# Patient Record
Sex: Male | Born: 1935 | Race: White | Hispanic: No | Marital: Married | Smoking: Former smoker
Health system: Southern US, Community
[De-identification: ages and names within clinical notes are randomized; demographics above are authoritative.]

## PROBLEM LIST (undated history)

## (undated) DIAGNOSIS — E785 Hyperlipidemia, unspecified: Secondary | ICD-10-CM

## (undated) DIAGNOSIS — I872 Venous insufficiency (chronic) (peripheral): Secondary | ICD-10-CM

## (undated) DIAGNOSIS — I4892 Unspecified atrial flutter: Secondary | ICD-10-CM

## (undated) DIAGNOSIS — I7772 Dissection of iliac artery: Secondary | ICD-10-CM

## (undated) DIAGNOSIS — I8390 Asymptomatic varicose veins of unspecified lower extremity: Secondary | ICD-10-CM

## (undated) DIAGNOSIS — C61 Malignant neoplasm of prostate: Secondary | ICD-10-CM

## (undated) DIAGNOSIS — Z992 Dependence on renal dialysis: Secondary | ICD-10-CM

## (undated) DIAGNOSIS — I219 Acute myocardial infarction, unspecified: Secondary | ICD-10-CM

## (undated) DIAGNOSIS — I2510 Atherosclerotic heart disease of native coronary artery without angina pectoris: Secondary | ICD-10-CM

## (undated) DIAGNOSIS — H9192 Unspecified hearing loss, left ear: Secondary | ICD-10-CM

## (undated) DIAGNOSIS — I739 Peripheral vascular disease, unspecified: Secondary | ICD-10-CM

## (undated) DIAGNOSIS — F32A Depression, unspecified: Secondary | ICD-10-CM

## (undated) DIAGNOSIS — M069 Rheumatoid arthritis, unspecified: Secondary | ICD-10-CM

## (undated) DIAGNOSIS — K219 Gastro-esophageal reflux disease without esophagitis: Secondary | ICD-10-CM

## (undated) DIAGNOSIS — I10 Essential (primary) hypertension: Secondary | ICD-10-CM

## (undated) DIAGNOSIS — R1310 Dysphagia, unspecified: Secondary | ICD-10-CM

## (undated) DIAGNOSIS — I714 Abdominal aortic aneurysm, without rupture, unspecified: Secondary | ICD-10-CM

## (undated) DIAGNOSIS — I499 Cardiac arrhythmia, unspecified: Secondary | ICD-10-CM

## (undated) DIAGNOSIS — N289 Disorder of kidney and ureter, unspecified: Secondary | ICD-10-CM

## (undated) DIAGNOSIS — K5900 Constipation, unspecified: Secondary | ICD-10-CM

## (undated) DIAGNOSIS — E039 Hypothyroidism, unspecified: Secondary | ICD-10-CM

## (undated) DIAGNOSIS — I639 Cerebral infarction, unspecified: Secondary | ICD-10-CM

## (undated) DIAGNOSIS — I82409 Acute embolism and thrombosis of unspecified deep veins of unspecified lower extremity: Secondary | ICD-10-CM

## (undated) DIAGNOSIS — S129XXA Fracture of neck, unspecified, initial encounter: Secondary | ICD-10-CM

## (undated) DIAGNOSIS — N186 End stage renal disease: Secondary | ICD-10-CM

## (undated) DIAGNOSIS — F329 Major depressive disorder, single episode, unspecified: Secondary | ICD-10-CM

## (undated) DIAGNOSIS — I5022 Chronic systolic (congestive) heart failure: Secondary | ICD-10-CM

## (undated) HISTORY — DX: Asymptomatic varicose veins of unspecified lower extremity: I83.90

## (undated) HISTORY — PX: CATARACT EXTRACTION: SUR2

## (undated) HISTORY — PX: EYE SURGERY: SHX253

## (undated) HISTORY — PX: CARDIAC SURGERY: SHX584

---

## 2005-10-02 ENCOUNTER — Ambulatory Visit: Payer: Self-pay | Admitting: Cardiology

## 2005-12-21 ENCOUNTER — Ambulatory Visit: Payer: Self-pay | Admitting: Cardiology

## 2006-11-05 DIAGNOSIS — I7772 Dissection of iliac artery: Secondary | ICD-10-CM

## 2006-11-05 HISTORY — DX: Dissection of iliac artery: I77.72

## 2007-09-25 ENCOUNTER — Encounter (INDEPENDENT_AMBULATORY_CARE_PROVIDER_SITE_OTHER): Payer: Self-pay | Admitting: Ophthalmology

## 2007-09-25 ENCOUNTER — Ambulatory Visit (HOSPITAL_COMMUNITY): Admission: RE | Admit: 2007-09-25 | Discharge: 2007-09-26 | Payer: Self-pay | Admitting: Ophthalmology

## 2007-12-01 ENCOUNTER — Emergency Department (HOSPITAL_COMMUNITY): Admission: EM | Admit: 2007-12-01 | Discharge: 2007-12-01 | Payer: Self-pay | Admitting: Emergency Medicine

## 2010-09-14 ENCOUNTER — Ambulatory Visit
Admission: RE | Admit: 2010-09-14 | Discharge: 2010-10-23 | Payer: Self-pay | Source: Home / Self Care | Attending: Radiation Oncology | Admitting: Radiation Oncology

## 2010-09-15 ENCOUNTER — Ambulatory Visit (HOSPITAL_COMMUNITY)
Admission: RE | Admit: 2010-09-15 | Discharge: 2010-09-15 | Payer: Self-pay | Source: Home / Self Care | Admitting: Radiation Oncology

## 2011-01-16 LAB — CREATININE, SERUM: Creatinine, Ser: 2.01 mg/dL — ABNORMAL HIGH (ref 0.4–1.5)

## 2011-02-04 DIAGNOSIS — I82409 Acute embolism and thrombosis of unspecified deep veins of unspecified lower extremity: Secondary | ICD-10-CM

## 2011-02-04 HISTORY — DX: Acute embolism and thrombosis of unspecified deep veins of unspecified lower extremity: I82.409

## 2011-02-28 ENCOUNTER — Inpatient Hospital Stay (HOSPITAL_COMMUNITY)
Admission: EM | Admit: 2011-02-28 | Discharge: 2011-03-03 | DRG: 312 | Disposition: A | Discharge status: Home Health | Payer: Medicare Other | Attending: Otolaryngology | Admitting: Otolaryngology

## 2011-02-28 ENCOUNTER — Emergency Department (HOSPITAL_COMMUNITY): Payer: Medicare Other

## 2011-02-28 DIAGNOSIS — E785 Hyperlipidemia, unspecified: Secondary | ICD-10-CM | POA: Diagnosis present

## 2011-02-28 DIAGNOSIS — M069 Rheumatoid arthritis, unspecified: Secondary | ICD-10-CM | POA: Diagnosis present

## 2011-02-28 DIAGNOSIS — I498 Other specified cardiac arrhythmias: Secondary | ICD-10-CM | POA: Diagnosis present

## 2011-02-28 DIAGNOSIS — N400 Enlarged prostate without lower urinary tract symptoms: Secondary | ICD-10-CM | POA: Diagnosis present

## 2011-02-28 DIAGNOSIS — I951 Orthostatic hypotension: Principal | ICD-10-CM | POA: Diagnosis present

## 2011-02-28 DIAGNOSIS — F329 Major depressive disorder, single episode, unspecified: Secondary | ICD-10-CM | POA: Diagnosis present

## 2011-02-28 DIAGNOSIS — E875 Hyperkalemia: Secondary | ICD-10-CM | POA: Diagnosis present

## 2011-02-28 DIAGNOSIS — N189 Chronic kidney disease, unspecified: Secondary | ICD-10-CM | POA: Diagnosis present

## 2011-02-28 DIAGNOSIS — I825Y9 Chronic embolism and thrombosis of unspecified deep veins of unspecified proximal lower extremity: Secondary | ICD-10-CM | POA: Diagnosis present

## 2011-02-28 DIAGNOSIS — N289 Disorder of kidney and ureter, unspecified: Secondary | ICD-10-CM | POA: Diagnosis present

## 2011-02-28 DIAGNOSIS — I129 Hypertensive chronic kidney disease with stage 1 through stage 4 chronic kidney disease, or unspecified chronic kidney disease: Secondary | ICD-10-CM | POA: Diagnosis present

## 2011-02-28 DIAGNOSIS — F3289 Other specified depressive episodes: Secondary | ICD-10-CM | POA: Diagnosis present

## 2011-02-28 HISTORY — DX: Essential (primary) hypertension: I10

## 2011-02-28 LAB — DIFFERENTIAL
Basophils Absolute: 0 10*3/uL (ref 0.0–0.1)
Lymphocytes Relative: 21 % (ref 12–46)
Lymphs Abs: 1.5 10*3/uL (ref 0.7–4.0)
Monocytes Absolute: 0.6 10*3/uL (ref 0.1–1.0)
Neutro Abs: 5.2 10*3/uL (ref 1.7–7.7)

## 2011-02-28 LAB — BASIC METABOLIC PANEL
CO2: 22 mEq/L (ref 19–32)
Glucose, Bld: 92 mg/dL (ref 70–99)
Potassium: 5.9 mEq/L — ABNORMAL HIGH (ref 3.5–5.1)
Sodium: 138 mEq/L (ref 135–145)

## 2011-02-28 LAB — CBC
HCT: 40 % (ref 39.0–52.0)
Hemoglobin: 12.4 g/dL — ABNORMAL LOW (ref 13.0–17.0)
MCV: 87.5 fL (ref 78.0–100.0)
WBC: 7.5 10*3/uL (ref 4.0–10.5)

## 2011-02-28 LAB — POCT CARDIAC MARKERS: CKMB, poc: 1 ng/mL (ref 1.0–8.0)

## 2011-03-01 ENCOUNTER — Inpatient Hospital Stay (HOSPITAL_COMMUNITY): Payer: Medicare Other

## 2011-03-01 LAB — CARDIAC PANEL(CRET KIN+CKTOT+MB+TROPI)
CK, MB: 0.9 ng/mL (ref 0.3–4.0)
CK, MB: 1.1 ng/mL (ref 0.3–4.0)
Total CK: 23 U/L (ref 7–232)
Total CK: 25 U/L (ref 7–232)
Total CK: 29 U/L (ref 7–232)

## 2011-03-01 LAB — BASIC METABOLIC PANEL
CO2: 23 mEq/L (ref 19–32)
Chloride: 108 mEq/L (ref 96–112)
GFR calc non Af Amer: 29 mL/min — ABNORMAL LOW (ref >60)
Glucose, Bld: 83 mg/dL (ref 70–99)
Potassium: 4.6 mEq/L (ref 3.5–5.1)
Sodium: 139 mEq/L (ref 135–145)

## 2011-03-01 LAB — DIFFERENTIAL
Basophils Absolute: 0 10*3/uL (ref 0.0–0.1)
Lymphocytes Relative: 40 % (ref 12–46)
Neutro Abs: 2.2 10*3/uL (ref 1.7–7.7)
Neutrophils Relative %: 43 % (ref 43–77)

## 2011-03-01 LAB — CBC
HCT: 36.4 % — ABNORMAL LOW (ref 39.0–52.0)
Hemoglobin: 11.2 g/dL — ABNORMAL LOW (ref 13.0–17.0)
RBC: 4.16 MIL/uL — ABNORMAL LOW (ref 4.22–5.81)
RDW: 14.5 % (ref 11.5–15.5)
WBC: 5.1 10*3/uL (ref 4.0–10.5)

## 2011-03-01 NOTE — H&P (Signed)
[name], [name]               ACCOUNT NO.:  [id number]  MEDICAL RECORD NO.:  [id number]           PATIENT TYPE:  I  LOCATION:  A301                          FACILITY:  [facility]  PHYSICIAN:  Tarry Kos, MD       DATE OF BIRTH:  [birth year]  DATE OF ADMISSION:  02/28/2011 DATE OF DISCHARGE:  LH                             HISTORY & PHYSICAL   CHIEF COMPLAINT:  Syncope.  HISTORY OF PRESENT ILLNESS:  Mr. [name] is a 77 year old male with a history of chronic kidney disease, rheumatoid arthritis who presented to the emergency department after suffering from a syncopal episode at the store today at around 12:30 p.m.  He says he ate breakfast.  He did not eat lunch.  He was standing at the store to check out.  He suddenly got very dizzy and was experiencing some neck pain, at which point, he passed out for just several seconds and came back to his normal mental status.  It happened very briefly.  He does not have any focal neurological deficits.  He denies any slurred speech, denies any change in his movement of his arms or legs.  He has not had any confusion.  His wife and several family members are with him right now and they said that they did not notice anything after the syncopal episode.  He did not suffer any significant head trauma.  He denies any heart problems. He really does not know much of his past medical history and he has never been admitted here before.  He does have chronic kidney disease. He states he has a BPH.  He has never had a heart attack or stroke before.  He has not been running any fevers.  No nausea, no vomiting, no diarrhea and no dysuria.  REVIEW OF SYSTEMS:  Otherwise negative.  PAST MEDICAL HISTORY:  He really does not know and there is no past medical records here for rheumatoid arthritis.  He says he has BPH.  MEDICATIONS:  He has no idea what medications he is on and they are not here.  I have asked his family members to bring those in.  SOCIAL  HISTORY:  Nonsmoker.  No alcohol.  No IV drug abuse.  ALLERGIES:  None.  PHYSICAL EXAMINATION:  VITALS:  Temperature is 97.8, blood pressure has been between 100 and 116 with diastolics in the 50s, heart rate initially was low in the 50s, this is between 55 and 75, respiratory rate 17, 97% O2 sats on room air. GENERAL:  He is alert and oriented x4.  No apparent distress, cooperative fairly. CARDIAC:  Regular rate and rhythm without murmurs, rubs or gallops. CHEST:  Clear to auscultation bilaterally with or rales.  No wheeze, rhonchi, or rubs. ABDOMEN:  Soft, nontender, and nondistended.  Positive bowel sounds.  No hepatosplenomegaly. EXTREMITIES:  No clubbing, cyanosis, or edema. PSYCH:  Normal mood and affect. NEURO:  Cranial nerves II through XII grossly intact.  No focal neurologic deficits.  LABORATORY DATA:  His sodium is normal.  His potassium is 5.9.  His creatinine is 2.2.  His last creatinine was in  November 2008 and that was 1.6.  Hemoglobin is normal.  White count is normal.  Cardiac enzymes are negative.  An 12-lead EKG sinus bradycardia without any acute changes.  CT of his head shows a small lacunar-type infarct in the left thalamus.  No skull fracture.  CT of the spine normal.  ASSESSMENT AND PLAN: 1. This is a 77 year old male with syncope of unclear etiology.  His     CT shows a questionable lacunar infarct which is small.  I am not     sure this is the cause of his symptoms and we are going to rule out     other issues and check MRI, MRA of his head and neck.  Also,     continued on telemetry and checked a 2-D echo of his heart and     obtained neurological checks q.4 h.  He has no focal neurologic     deficits right now, but it sounds more or like an arrhythmia than a     stroke per his history.  We will check orthostatics also to make     sure that he is not orthostatic. 2. Sinus bradycardia.  Again, rule out arrhythmias and I cannot review     his home  medications as he has no idea what medicines he is on.  I     have asked the family to bring these in, but this could also be due     to medication effect depending on what he is on. 3. Chronic kidney disease with a creatinine right now of 2.3.  His     last creatinine here was in 2008 and it was 1.6-1.7.  I placed him     on some gentle IV fluid hydration overnight and rechecked his BMP     in the morning. 4. Further recommendations depending on overall hospital course.                                           ______________________________ Tarry Kos, MD     RD/MEDQ  D:  02/28/2011  T:  03/01/2011  Job:  [id number]  Electronically Signed by Tarry Kos MD on 03/01/2011 06:27:48 AM

## 2011-03-02 ENCOUNTER — Encounter (HOSPITAL_COMMUNITY): Payer: Self-pay

## 2011-03-02 ENCOUNTER — Inpatient Hospital Stay (HOSPITAL_COMMUNITY): Payer: Medicare Other

## 2011-03-02 DIAGNOSIS — I517 Cardiomegaly: Secondary | ICD-10-CM

## 2011-03-02 LAB — LIPID PANEL
Cholesterol: 246 mg/dL — ABNORMAL HIGH (ref 0–200)
HDL: 30 mg/dL — ABNORMAL LOW (ref >39)
LDL Cholesterol: 185 mg/dL — ABNORMAL HIGH (ref 0–99)
Total CHOL/HDL Ratio: 8.2 RATIO
Triglycerides: 155 mg/dL — ABNORMAL HIGH (ref <150)
VLDL: 31 mg/dL (ref 0–40)

## 2011-03-02 LAB — CBC
HCT: 39 % (ref 39.0–52.0)
MCHC: 30.8 g/dL (ref 30.0–36.0)
Platelets: 350 10*3/uL (ref 150–400)
RDW: 14.5 % (ref 11.5–15.5)
WBC: 5.6 10*3/uL (ref 4.0–10.5)

## 2011-03-02 LAB — DIFFERENTIAL
Basophils Absolute: 0 10*3/uL (ref 0.0–0.1)
Basophils Relative: 1 % (ref 0–1)
Eosinophils Absolute: 0.4 10*3/uL (ref 0.0–0.7)
Eosinophils Relative: 7 % — ABNORMAL HIGH (ref 0–5)
Monocytes Absolute: 0.6 10*3/uL (ref 0.1–1.0)

## 2011-03-02 LAB — BASIC METABOLIC PANEL
BUN: 34 mg/dL — ABNORMAL HIGH (ref 6–23)
CO2: 23 mEq/L (ref 19–32)
Calcium: 9 mg/dL (ref 8.4–10.5)
Chloride: 110 mEq/L (ref 96–112)
Creatinine, Ser: 1.56 mg/dL — ABNORMAL HIGH (ref 0.4–1.5)
GFR calc Af Amer: 53 mL/min — ABNORMAL LOW (ref >60)
GFR calc non Af Amer: 44 mL/min — ABNORMAL LOW (ref >60)
Glucose, Bld: 78 mg/dL (ref 70–99)
Potassium: 4.8 mEq/L (ref 3.5–5.1)
Sodium: 137 mEq/L (ref 135–145)

## 2011-03-02 LAB — PROTIME-INR: Prothrombin Time: 13.2 seconds (ref 11.6–15.2)

## 2011-03-02 LAB — FERRITIN: Ferritin: 101 ng/mL (ref 22–322)

## 2011-03-02 LAB — APTT: aPTT: 32 seconds (ref 24–37)

## 2011-03-02 LAB — IRON AND TIBC: Saturation Ratios: 16 % — ABNORMAL LOW (ref 20–55)

## 2011-03-02 LAB — T4, FREE: Free T4: 1.18 ng/dL (ref 0.80–1.80)

## 2011-03-02 MED ORDER — TECHNETIUM TO 99M ALBUMIN AGGREGATED
5.0000 | Freq: Once | INTRAVENOUS | Status: AC | PRN
  Administered 2011-03-02: 4.8 via INTRAVENOUS

## 2011-03-02 MED ORDER — XENON XE 133 GAS
10.0000 | GAS_FOR_INHALATION | Freq: Once | RESPIRATORY_TRACT | Status: AC | PRN
  Administered 2011-03-02: 6.1 via RESPIRATORY_TRACT

## 2011-03-03 LAB — DIFFERENTIAL
Basophils Absolute: 0 10*3/uL (ref 0.0–0.1)
Basophils Relative: 1 % (ref 0–1)
Lymphocytes Relative: 39 % (ref 12–46)
Monocytes Relative: 11 % (ref 3–12)
Neutro Abs: 2.2 10*3/uL (ref 1.7–7.7)
Neutrophils Relative %: 42 % — ABNORMAL LOW (ref 43–77)

## 2011-03-03 LAB — BASIC METABOLIC PANEL
Chloride: 107 mEq/L (ref 96–112)
GFR calc non Af Amer: 43 mL/min — ABNORMAL LOW (ref >60)
Potassium: 4.4 mEq/L (ref 3.5–5.1)
Sodium: 136 mEq/L (ref 135–145)

## 2011-03-03 LAB — CBC
HCT: 37.2 % — ABNORMAL LOW (ref 39.0–52.0)
Hemoglobin: 11.6 g/dL — ABNORMAL LOW (ref 13.0–17.0)
RBC: 4.29 MIL/uL (ref 4.22–5.81)

## 2011-03-03 LAB — PROTIME-INR
INR: 1.03 (ref 0.00–1.49)
Prothrombin Time: 13.7 seconds (ref 11.6–15.2)

## 2011-03-12 NOTE — Discharge Summary (Signed)
[name], [name]               ACCOUNT NO.:  [id number]  MEDICAL RECORD NO.:  [id number]           PATIENT TYPE:  I  LOCATION:  A201                          FACILITY:  [facility]  PHYSICIAN:  Kierrah Kilbride L. Lendell Caprice, MDDATE OF BIRTH:  03-Feb-1934  DATE OF ADMISSION:  02/28/2011 DATE OF DISCHARGE:  LH                              DISCHARGE SUMMARY   DISCHARGE DIAGNOSES: 1. Syncope. 2. Acute renal insufficiency with a history of chronic kidney disease,     suspect prerenal azotemia. 3. Sinus bradycardia. 4. Hypertension. 5. Left leg deep venous thrombosis, suspect subacute. 6. Benign prostatic hypertrophy. 7. Hyperkalemia, resolved. 8. Hyperlipidemia, defer treatment to primary care physician.  DISCHARGE MEDICATIONS: 1. Lovenox 150 mg subcutaneously daily for a minimum of 3 days or     until INR is greater than or equal to 2.0. 2. Warfarin 5 mg daily or as directed to keep INR between 2 and 3. 3. Stop aspirin while on Coumadin. 4. Over-the-counter eyedrops as needed for dry eyes. 5. Oxybutynin 5 mg daily. 6. Diltiazem CD 360 mg a day. 7. Lisinopril 10 mg a day. 8. Fluoxetine 20 mg 2 capsules daily. 9. Levothyroxine 25 mcg a day.  CONDITION:  Stable.  ACTIVITY:  Ad lib.  FOLLOWUP:  With Dr. Sherril Croon within a week.  Please adjust Coumadin to keep INR between 2 and 3.  Home health is being arranged for Lovenox injections and daily INRs starting Monday 4:30 until INR greater than or equal to 2.0.  DIET:  Should be warfarin friendly heart healthy.  CONDITION:  Stable.  CONSULTATIONS:  None.  PROCEDURES:  None.  LABORATORY DATA:  CBC significant for a hemoglobin of 12.4, otherwise unremarkable.  Basic metabolic panel on admission significant for a potassium of 5.9, BUN of 42 and creatinine 2.24.  At discharge; potassium is 4.4, BUN is 30, creatinine 1.58.  PTT is normal, INR on March 02, 2011, was 0.98, INR at discharge is 1.03.  Serial cardiac enzymes negative.  LDL is 185,  triglycerides 155, total cholesterol 246, TSH 3.623.  Anemia panel is normal.  DIAGNOSTICS:  CT of the brain on admission showed small lacunar-type infarct to the left thalamus likely acute.  CT of the C-spine showed nothing acute, normal alignment.  MRI of the brain showed nothing acute, possibly mild chronic small vessel disease.  MRA of the brain was negative for age.  Doppler of the left leg showed nonocclusive DVT within the left superficial femoral and popliteal veins which could be subacute or chronic.  Carotid Dopplers showed minimal carotid atherosclerosis.  V/Q scan showed low probability for PE, air trapping at the bases, two views of the chest showed emphysema, scarring at the left lung base, cardiomegaly without failure.  Echocardiogram showed mild concentric hypertrophy, ejection fraction 50-55%, normal wall motion, mildly to moderately dilated left atrium, right atrium was mildly dilated.  EKG showed sinus bradycardia.  HISTORY AND HOSPITAL COURSE:  Please see H and P for details.  [name] is a pleasant 77 year old white male with a brief syncopal episode.  He had a BUN and creatinine that were elevated above our records  from 2008.  His blood pressure on arrival to the emergency room was about 100/50.  Heart rate initially in the 50s, but normalized.  He had been feeling dizzy after breakfast.  He passed out for a few seconds.  Orthostatics were ordered, but unfortunately not completed until after he received IV fluids.  I suspect he probably had orthostatic hypotension in the setting of prerenal azotemia.  His antihypertensives were held.  He was mildly hyperkalemic, but this normalized with hydration.  He had an extensive workup especially since his CAT scan of the brain showed up possibly acute lacunar infarct. MRI, however, showed no acute infarct.  I doubt he had a stroke.  After hydration, his blood pressure increased and he is being started back on his  antihypertensives.  Dr. Sherrie Mustache did note that he had left greater than right edema of his leg and ordered a Doppler.  It did show DVT, but most likely subacute or chronic.  The patient reports that his leg started swelling about a year ago.  He has never been treated for DVT. As it is unclear how long he has had this blood clot and he did have a syncopal episode, I elected to start him on Coumadin for 3-6 months. V/Q scan, however, was low probability, so I doubt he had a PE as the inciting event.  I suspect his DVT was an incidental finding.  He was given Lovenox teaching and Coumadin teaching.  He will be sent home with home health.  I asked that an appointment with his primary care physician be made yesterday, Friday, but it was not done.  I have asked the patient to call on Monday to schedule an appointment for within a week.  His prerenal azotemia improved and I suspect his creatinine is about baseline.  He has had no further episodes and is no longer dizzy.  He is ambulating and feeling fine.  I instructed him that should he have any further dizziness to hold his antihypertensives and follow up with his primary care physician.  Total time on the day of discharge is greater than 30 minutes.     Taneisha Fuson L. Lendell Caprice, MD     CLS/MEDQ  D:  03/03/2011  T:  03/03/2011  Job:  [id number]  cc:   Doreen Beam, MD Fax: [phone]  Electronically Signed by Crista Curb MD on 03/12/2011 08:06:06 AM

## 2011-03-20 NOTE — Op Note (Signed)
[name], [name]               ACCOUNT NO.:  [id number]   MEDICAL RECORD NO.:  [id number]          PATIENT TYPE:  OIB   LOCATION:  2550                         FACILITY:  [facility]   PHYSICIAN:  Beulah Gandy. Ashley Royalty, M.D. DATE OF BIRTH:  06/14/1934   DATE OF PROCEDURE:  09/25/2007  DATE OF DISCHARGE:                               OPERATIVE REPORT   ADMISSION DIAGNOSIS:  Dislocated intraocular lens in the vitreous of the  left eye.   PROCEDURE:  Pars plana vitrectomy, removal of dislocated intraocular  lens from vitreous, retinal photocoagulation, placement of secondary  intraocular lens with suture, left eye.   SURGEON:  Alan Mulder, M.D.   ASSISTANT:  Rosalie Doctor, MA   ANESTHESIA:  General.   DETAILS:  Usual prep and drape, conjunctival peritomy from 8 o'clock  around to 4 o'clock.  Half-thickness scleral flaps raised at 3 o'clock  and 9 o'clock in anticipation of IOL suture.  A three layer  corneoscleral wound was created for removal and replacement of IOL  between 10 o'clock and 2 o'clock.  25 gauge trocars were placed at 10, 2  and 4 o'clock.  Infusion at 4 o'clock.  Provisc placed on the corneal  surface.  The 25-gauge system was used to remove vitreous from around  the intraocular lens and free it from its entanglement.  Vitreous was  also removed from the iris and the previous wound with the vitreous  cutter.  The corneoscleral wound was opened.  The lens was passed from  the posterior chamber into the anterior chamber and out through the  corneoscleral wound.  The lens was sent for identification. Additional  vitrectomy was carried out and the Sinskey hook was used to sweep the  iris to remove vitreous from the wound and the iris margin.  All of this  vitreous was removed with the vitreous cutter.  The vitrectomy, of  course was carried posteriorly and down to the macular surface where all  vitreous was removed.  The indirect ophthalmoscope laser was moved into  place,  428 burns placed around the retinal periphery with a power of 400  milliwatts, 1000 microns each and 0.1 seconds each.  A new intraocular  lens from [employer]. model CZ 700BD power 20.5 D, length  12.5 mm, optic 7.0 mm, serial number [id number], expiration date  09/2011 was brought onto the field.  It was inspected and cleaned.  Prolene sutures were passed from 3 o'clock to 9 o'clock behind the iris  and anterior to the capsular remnants.  Only minimal capsular remnants  were seen but the sutures were placed in the ciliary sulcus.  Sutures  were externalized through the corneoscleral wound and attached to the  IOL.  The IOL was passed into the anterior chamber, then posterior  chamber.  It was dialed into place by withdrawing the Prolene sutures.  The sutures were knotted externally beneath the flaps and the flaps were  allowed to cover the knots.  The corneal wound was closed with three  interrupted 10-0 nylon sutures.  The wound was tested and found to be  tight.  Additional 25-gauge vitrectomy was carried out removing some  blood and pigment from the vitreous cavity.  The instruments were  removed from the eye, the trocars were removed as well.  The wounds were  tested and found to be tight.  The conjunctiva was closed with 7-0  chromic suture.  Polymyxin and gentamicin were irrigated into Tenon's  space.  No atropine was used.  Marcaine was injected around the globe  for postop pain.  Decadron 10 mg was injected to the lower subconjunctival space.  The  closing pressure was 15 with a Barraquer tonometer.  TobraDex ophthalmic  ointment, a patch and shield were placed.  The patient is awakened,  taken to recovery in satisfactory condition.  Duration one-half hours.  Complications none.      Beulah Gandy. Ashley Royalty, M.D.  Electronically Signed     JDM/MEDQ  D:  09/25/2007  T:  09/26/2007  Job:  [id number]

## 2011-08-14 LAB — CBC
HCT: 39.7
Hemoglobin: 12.8 — ABNORMAL LOW
MCV: 81.3
RBC: 4.88
WBC: 9.9

## 2011-08-14 LAB — BASIC METABOLIC PANEL
Chloride: 102
GFR calc Af Amer: 49 — ABNORMAL LOW
Potassium: 4.3
Sodium: 141

## 2012-01-06 ENCOUNTER — Emergency Department (HOSPITAL_COMMUNITY)
Admission: EM | Admit: 2012-01-06 | Discharge: 2012-01-06 | Discharge status: Against Medical Advice | Payer: Medicare Other

## 2012-01-06 ENCOUNTER — Encounter (HOSPITAL_COMMUNITY): Payer: Self-pay

## 2012-01-06 NOTE — ED Notes (Addendum)
C/o a flare-up to neck, shoulders and arms, hx of same, pt pacing in room and upset, wants pain med and states that he will leave if not seen within next 10 minutes, EDP and EDPA notified; stated that he has tried tylenol and ibuprofen without including a heating pad

## 2012-01-06 NOTE — ED Notes (Signed)
Pt walked out of room to leave without informing any staff as H. Beverely Pace, PA was walking into room, i asked pt if leaving and pt refused to answer and kept walking

## 2012-01-06 NOTE — ED Notes (Signed)
Pt presents with chronic neck,shoulder, bilateral arm and hand pain. Pt states this flare up started 1 week ago.

## 2013-05-12 ENCOUNTER — Encounter (HOSPITAL_COMMUNITY): Payer: Self-pay | Admitting: *Deleted

## 2013-05-12 ENCOUNTER — Inpatient Hospital Stay (HOSPITAL_COMMUNITY)
Admission: EM | Admit: 2013-05-12 | Discharge: 2013-06-03 | DRG: 233 | Disposition: A | Discharge status: Rehabilitation Facility | Payer: Medicare Other | Source: Other Acute Inpatient Hospital | Attending: Surgery | Admitting: Surgery

## 2013-05-12 ENCOUNTER — Other Ambulatory Visit: Payer: Self-pay | Admitting: Physician Assistant

## 2013-05-12 ENCOUNTER — Other Ambulatory Visit: Payer: Self-pay

## 2013-05-12 DIAGNOSIS — G9341 Metabolic encephalopathy: Secondary | ICD-10-CM | POA: Diagnosis not present

## 2013-05-12 DIAGNOSIS — I214 Non-ST elevation (NSTEMI) myocardial infarction: Secondary | ICD-10-CM

## 2013-05-12 DIAGNOSIS — T45515A Adverse effect of anticoagulants, initial encounter: Secondary | ICD-10-CM | POA: Diagnosis present

## 2013-05-12 DIAGNOSIS — I714 Abdominal aortic aneurysm, without rupture, unspecified: Secondary | ICD-10-CM | POA: Diagnosis present

## 2013-05-12 DIAGNOSIS — K219 Gastro-esophageal reflux disease without esophagitis: Secondary | ICD-10-CM | POA: Diagnosis present

## 2013-05-12 DIAGNOSIS — I4892 Unspecified atrial flutter: Secondary | ICD-10-CM | POA: Diagnosis present

## 2013-05-12 DIAGNOSIS — Z87891 Personal history of nicotine dependence: Secondary | ICD-10-CM

## 2013-05-12 DIAGNOSIS — D62 Acute posthemorrhagic anemia: Secondary | ICD-10-CM | POA: Diagnosis not present

## 2013-05-12 DIAGNOSIS — R7309 Other abnormal glucose: Secondary | ICD-10-CM | POA: Diagnosis not present

## 2013-05-12 DIAGNOSIS — I519 Heart disease, unspecified: Secondary | ICD-10-CM | POA: Diagnosis not present

## 2013-05-12 DIAGNOSIS — I498 Other specified cardiac arrhythmias: Secondary | ICD-10-CM | POA: Diagnosis not present

## 2013-05-12 DIAGNOSIS — I2582 Chronic total occlusion of coronary artery: Secondary | ICD-10-CM | POA: Diagnosis present

## 2013-05-12 DIAGNOSIS — Z79899 Other long term (current) drug therapy: Secondary | ICD-10-CM

## 2013-05-12 DIAGNOSIS — I825Y9 Chronic embolism and thrombosis of unspecified deep veins of unspecified proximal lower extremity: Secondary | ICD-10-CM | POA: Diagnosis present

## 2013-05-12 DIAGNOSIS — N186 End stage renal disease: Secondary | ICD-10-CM | POA: Diagnosis not present

## 2013-05-12 DIAGNOSIS — R079 Chest pain, unspecified: Secondary | ICD-10-CM

## 2013-05-12 DIAGNOSIS — Y832 Surgical operation with anastomosis, bypass or graft as the cause of abnormal reaction of the patient, or of later complication, without mention of misadventure at the time of the procedure: Secondary | ICD-10-CM | POA: Diagnosis not present

## 2013-05-12 DIAGNOSIS — K560 Paralytic ileus: Secondary | ICD-10-CM | POA: Diagnosis not present

## 2013-05-12 DIAGNOSIS — Z8249 Family history of ischemic heart disease and other diseases of the circulatory system: Secondary | ICD-10-CM

## 2013-05-12 DIAGNOSIS — I739 Peripheral vascular disease, unspecified: Secondary | ICD-10-CM | POA: Diagnosis present

## 2013-05-12 DIAGNOSIS — D696 Thrombocytopenia, unspecified: Secondary | ICD-10-CM | POA: Diagnosis not present

## 2013-05-12 DIAGNOSIS — Z923 Personal history of irradiation: Secondary | ICD-10-CM

## 2013-05-12 DIAGNOSIS — J189 Pneumonia, unspecified organism: Secondary | ICD-10-CM

## 2013-05-12 DIAGNOSIS — Z7901 Long term (current) use of anticoagulants: Secondary | ICD-10-CM

## 2013-05-12 DIAGNOSIS — F329 Major depressive disorder, single episode, unspecified: Secondary | ICD-10-CM | POA: Diagnosis present

## 2013-05-12 DIAGNOSIS — J9819 Other pulmonary collapse: Secondary | ICD-10-CM | POA: Diagnosis not present

## 2013-05-12 DIAGNOSIS — Z992 Dependence on renal dialysis: Secondary | ICD-10-CM

## 2013-05-12 DIAGNOSIS — I120 Hypertensive chronic kidney disease with stage 5 chronic kidney disease or end stage renal disease: Secondary | ICD-10-CM | POA: Diagnosis present

## 2013-05-12 DIAGNOSIS — I509 Heart failure, unspecified: Secondary | ICD-10-CM | POA: Diagnosis present

## 2013-05-12 DIAGNOSIS — Y9210 Unspecified residential institution as the place of occurrence of the external cause: Secondary | ICD-10-CM | POA: Diagnosis not present

## 2013-05-12 DIAGNOSIS — I4891 Unspecified atrial fibrillation: Secondary | ICD-10-CM | POA: Diagnosis not present

## 2013-05-12 DIAGNOSIS — E872 Acidosis, unspecified: Secondary | ICD-10-CM | POA: Diagnosis not present

## 2013-05-12 DIAGNOSIS — R5381 Other malaise: Secondary | ICD-10-CM | POA: Diagnosis not present

## 2013-05-12 DIAGNOSIS — K929 Disease of digestive system, unspecified: Secondary | ICD-10-CM | POA: Diagnosis not present

## 2013-05-12 DIAGNOSIS — I2510 Atherosclerotic heart disease of native coronary artery without angina pectoris: Secondary | ICD-10-CM | POA: Diagnosis present

## 2013-05-12 DIAGNOSIS — I872 Venous insufficiency (chronic) (peripheral): Secondary | ICD-10-CM | POA: Diagnosis present

## 2013-05-12 DIAGNOSIS — E039 Hypothyroidism, unspecified: Secondary | ICD-10-CM | POA: Diagnosis present

## 2013-05-12 DIAGNOSIS — I517 Cardiomegaly: Secondary | ICD-10-CM

## 2013-05-12 DIAGNOSIS — R1312 Dysphagia, oropharyngeal phase: Secondary | ICD-10-CM | POA: Diagnosis not present

## 2013-05-12 DIAGNOSIS — N170 Acute kidney failure with tubular necrosis: Secondary | ICD-10-CM | POA: Diagnosis not present

## 2013-05-12 DIAGNOSIS — F3289 Other specified depressive episodes: Secondary | ICD-10-CM | POA: Diagnosis present

## 2013-05-12 DIAGNOSIS — J9600 Acute respiratory failure, unspecified whether with hypoxia or hypercapnia: Secondary | ICD-10-CM

## 2013-05-12 DIAGNOSIS — I5043 Acute on chronic combined systolic (congestive) and diastolic (congestive) heart failure: Secondary | ICD-10-CM

## 2013-05-12 DIAGNOSIS — R570 Cardiogenic shock: Secondary | ICD-10-CM | POA: Diagnosis not present

## 2013-05-12 DIAGNOSIS — N289 Disorder of kidney and ureter, unspecified: Secondary | ICD-10-CM | POA: Diagnosis present

## 2013-05-12 DIAGNOSIS — J690 Pneumonitis due to inhalation of food and vomit: Secondary | ICD-10-CM | POA: Diagnosis not present

## 2013-05-12 DIAGNOSIS — Z6835 Body mass index (BMI) 35.0-35.9, adult: Secondary | ICD-10-CM

## 2013-05-12 DIAGNOSIS — I447 Left bundle-branch block, unspecified: Secondary | ICD-10-CM | POA: Diagnosis present

## 2013-05-12 DIAGNOSIS — Z8546 Personal history of malignant neoplasm of prostate: Secondary | ICD-10-CM

## 2013-05-12 DIAGNOSIS — M069 Rheumatoid arthritis, unspecified: Secondary | ICD-10-CM | POA: Diagnosis present

## 2013-05-12 DIAGNOSIS — R791 Abnormal coagulation profile: Secondary | ICD-10-CM | POA: Diagnosis present

## 2013-05-12 DIAGNOSIS — Z951 Presence of aortocoronary bypass graft: Secondary | ICD-10-CM

## 2013-05-12 DIAGNOSIS — G8918 Other acute postprocedural pain: Secondary | ICD-10-CM | POA: Diagnosis not present

## 2013-05-12 DIAGNOSIS — Z86718 Personal history of other venous thrombosis and embolism: Secondary | ICD-10-CM

## 2013-05-12 DIAGNOSIS — Z954 Presence of other heart-valve replacement: Secondary | ICD-10-CM

## 2013-05-12 HISTORY — DX: Major depressive disorder, single episode, unspecified: F32.9

## 2013-05-12 HISTORY — DX: Depression, unspecified: F32.A

## 2013-05-12 HISTORY — DX: Unspecified hearing loss, left ear: H91.92

## 2013-05-12 LAB — ABO/RH: ABO/RH(D): O POS

## 2013-05-12 LAB — TYPE AND SCREEN: Antibody Screen: NEGATIVE

## 2013-05-12 LAB — TROPONIN I: Troponin I: 6.21 ng/mL (ref <0.30)

## 2013-05-12 MED ORDER — SODIUM CHLORIDE 0.9 % IJ SOLN: 3.0000 mL | INTRAMUSCULAR | Status: DC | PRN

## 2013-05-12 MED ORDER — ACETAMINOPHEN 325 MG PO TABS
650.0000 mg | ORAL_TABLET | ORAL | Status: DC | PRN
  Administered 2013-05-12: 650 mg via ORAL
  Filled 2013-05-12: qty 2

## 2013-05-12 MED ORDER — DILTIAZEM HCL ER COATED BEADS 360 MG PO CP24
360.0000 mg | ORAL_CAPSULE | Freq: Every day | ORAL | Status: DC
  Filled 2013-05-12: qty 1

## 2013-05-12 MED ORDER — ATORVASTATIN CALCIUM 20 MG PO TABS
20.0000 mg | ORAL_TABLET | Freq: Every day | ORAL | Status: DC
  Administered 2013-05-12: 20 mg via ORAL
  Filled 2013-05-12 (×2): qty 1

## 2013-05-12 MED ORDER — NITROGLYCERIN 0.4 MG SL SUBL: 0.4000 mg | SUBLINGUAL_TABLET | SUBLINGUAL | Status: DC | PRN

## 2013-05-12 MED ORDER — NITROGLYCERIN IN D5W 200-5 MCG/ML-% IV SOLN
2.0000 ug/min | INTRAVENOUS | Status: DC
  Filled 2013-05-12: qty 250

## 2013-05-12 MED ORDER — ASPIRIN 81 MG PO CHEW
324.0000 mg | CHEWABLE_TABLET | ORAL | Status: AC
  Administered 2013-05-13: 324 mg via ORAL
  Filled 2013-05-12: qty 4

## 2013-05-12 MED ORDER — METOPROLOL TARTRATE 1 MG/ML IV SOLN
5.0000 mg | Freq: Four times a day (QID) | INTRAVENOUS | Status: DC | PRN
  Administered 2013-05-12 – 2013-05-13 (×2): 5 mg via INTRAVENOUS
  Filled 2013-05-12 (×3): qty 5

## 2013-05-12 MED ORDER — PANTOPRAZOLE SODIUM 40 MG PO TBEC: 40.0000 mg | DELAYED_RELEASE_TABLET | Freq: Every day | ORAL | Status: DC

## 2013-05-12 MED ORDER — SODIUM CHLORIDE 0.9 % IJ SOLN
3.0000 mL | Freq: Two times a day (BID) | INTRAMUSCULAR | Status: DC
  Administered 2013-05-12: 3 mL via INTRAVENOUS
  Administered 2013-05-12 – 2013-05-13 (×3): via INTRAVENOUS

## 2013-05-12 MED ORDER — MORPHINE SULFATE 4 MG/ML IJ SOLN
4.0000 mg | INTRAMUSCULAR | Status: DC | PRN
Start: 2013-05-12 — End: 2013-05-12
  Administered 2013-05-12: 4 mg via INTRAVENOUS
  Filled 2013-05-12: qty 1

## 2013-05-12 MED ORDER — MORPHINE SULFATE 2 MG/ML IJ SOLN: 2.0000 mg | INTRAMUSCULAR | Status: DC | PRN

## 2013-05-12 MED ORDER — MORPHINE SULFATE 4 MG/ML IJ SOLN
4.0000 mg | INTRAMUSCULAR | Status: DC | PRN
  Administered 2013-05-13 (×3): 4 mg via INTRAVENOUS
  Filled 2013-05-12 (×3): qty 1

## 2013-05-12 MED ORDER — ONDANSETRON HCL 4 MG/2ML IJ SOLN
4.0000 mg | Freq: Four times a day (QID) | INTRAMUSCULAR | Status: DC | PRN
  Administered 2013-05-12 – 2013-05-13 (×2): 4 mg via INTRAVENOUS
  Filled 2013-05-12 (×2): qty 2

## 2013-05-12 MED ORDER — SIMVASTATIN 40 MG PO TABS: 40.0000 mg | ORAL_TABLET | Freq: Every day | ORAL | Status: DC

## 2013-05-12 MED ORDER — SODIUM CHLORIDE 0.9 % IV SOLN: INTRAVENOUS | Status: DC

## 2013-05-12 MED ORDER — VITAMIN K1 10 MG/ML IJ SOLN
1.0000 mg | Freq: Once | INTRAVENOUS | Status: AC
  Administered 2013-05-12: 1 mg via INTRAVENOUS
  Filled 2013-05-12: qty 0.1

## 2013-05-12 MED ORDER — ASPIRIN EC 81 MG PO TBEC
81.0000 mg | DELAYED_RELEASE_TABLET | Freq: Every day | ORAL | Status: DC
  Filled 2013-05-12: qty 1

## 2013-05-12 MED ORDER — SODIUM CHLORIDE 0.9 % IV SOLN: 250.0000 mL | INTRAVENOUS | Status: DC | PRN

## 2013-05-12 NOTE — Progress Notes (Signed)
ANTICOAGULATION CONSULT NOTE - Initial Consult  Pharmacy Consult for Heparin when INR <2 Indication: chest pain/ACS and recent LLL DVT (04/23/13), atrial flutter  No Known Allergies  Patient Measurements: Height: 6\' 1"  (185.4 cm) Weight: 243 lb 9.7 oz (110.5 kg) IBW/kg (Calculated) : 79.9 Heparin Dosing Weight: 103 kg  Vital Signs: Temp: 97.8 F (36.6 C) (07/08 2000) Temp src: Oral (07/08 2000) BP: 128/74 mmHg (07/08 2031) Pulse Rate: 86 (07/08 2031)  Labs:  Recent Labs  05/12/13 1748  TROPONINI 6.21*  other labs from [facility] today:   INR 3.6   Hgb 13.2, platelet count 244   BUN 45, Scr 2.13, K+ 4.5   Estimated creatinine clearance ~40-45 ml/min  Medical History: Past Medical History  Diagnosis Date  . Cancer   . Hypertension   . Collagen vascular disease   . Anginal pain   . Depression   . Arthritis    Assessment:   Transferred from [facility] to [facility] today. NSTEMI, for cardiac cath when INR <1.6, tentatively 7/9.   Recently diagnosed with LLE DVT (04/23/13). Has been on Coumadin 5 mg daily since that time. He reports last dose taken this morning.  INR 3.6 today at [facility].   Reversing with Vitamin K 1 mg IV (given ~5pm) and 2 units FFP.  Discussed with T. Arguello, PA-C this afternoon.  Also has atrial flutter.   He also relates prior use of Coumadin, though he thinks he was off Coumadin for >1 year.  Goal of Therapy:  Heparin level 0.3-0.7 units/ml Monitor platelets by anticoagulation protocol: Yes   Plan:   PT/INR scheduled for 11pm, about 6 hours after Vitamin K dose.     FFP to be given tonight.  Will follow closely, and begin heparin when INR <2.    Dennie Fetters, RPh Pager: [phone] 05/12/2013,8:46 PM

## 2013-05-12 NOTE — H&P (Signed)
[facility]                                       [place], [place]  [address]       NAME:  [facility] DAVID                  ROOM: UNIT NUMBER:  [id number]                        LOCATION:      ER ADM/VISIT DATE:     05/12/13                ADM PHYS:      Bernadette Hoit MD ACCT: [id number]                               DOB:           December 08, 2033    CONSULTATION REPORT    DATE OF CONSULTATION:  05/12/2013    PRIMARY CARDIOLOGIST:  Nona Dell, M.D. (new).    REFERRING PHYSICIAN:  Adrienne Mocha, M.D., [facility] Emergency Department.    REASON FOR CONSULTATION:  Myocardial infarction.    HISTORY OF PRESENT ILLNESS:  The patient is a 79 year old male with no known history of coronary artery disease, whom we have seen remotely for recurrent atrial flutter initially diagnosed as atrial flutter with 2:1 block in 2002. The patient has a history of normal left ventricular function and had a negative exercise stress Cardiolite in November of 2006.    More recently, the patient was diagnosed with left lower extremity deep venous thrombosis and was placed on Coumadin.  A recent follow up duplex imaging study, performed 04/23/2013 indicated chronic popliteal vein thrombus, which had improved.  The patient also has chronic kidney disease and presented with a creatinine of 2.1 (GFR 30) this admission.  INR was 3.6 on admission.    The patient developed severe (10/10) anterior chest discomfort at 4:00 a.m. this morning, characterized as a "burning" with radiation to the left shoulder, neck and left upper extremity.  He reported associated diaphoresis and nausea but no vomiting.  He also noted tachy palpitations.  Of note, he states that he has been having intermittent chest pain for the past week, which is new, culminating in his worst episode this morning.    The patient presented to the emergency department with a blood pressure of 140/101, pulse 133  and was afebrile.  He was noted to be in a tachy arrhythmia on presentation, reported as sinus tachycardia by the computer, but which appears to be atrial flutter with variable conduction.  Initial rate was 133. The patient was treated with aspirin, morphine and nitroglycerin.  He is currently on nitroglycerin infusion with reported improvement in his chest pain.    Labs were initially notable for INR of 3.6, creatinine 2.1 and a troponin I of 6.43.    Admission EKG showed atrial flutter at 133 with at least 1 mm J point elevation in the lead V1 with greater than 2 mm ST segment depression in leads V3-V6.  A follow up EKG showed improvement in the ST segment in lead  V1 with persistent ST segment depression in leads V4-V6 and leads 1 and aVL.  The   underlying rhythm is more clearly atrial flutter in this follow up EKG with variable conduction.    ALLERGIES:  No known drug allergies.    HOME MEDICATIONS: 1.   Aspirin 81 mg daily. 2.   Coumadin 5 mg daily. 3.   Simvastatin 40 mg at bedtime. 4.   Diltiazem 360 mg daily.    PAST MEDICAL HISTORY: 1.   Atrial flutter, recurrent.  Documented 2:1 block, 2002, treated medically. 2.   Normal left ventricular function. 3.   HTN. 4.   LLE DVT, Coumadin. 5.   Obesity. 6.   Prostate cancer, status post radiation therapy. 7.   Rheumatoid arthritis. 8.   Chronic kidney disease. 9.   GERD. 10.  Cervical spine compression fracture. 11.  Hypothyroidism. 12.  Left lower extremity stasis dermatitis, followed in the wound clinic. 13.  Cataract surgery. 14.  Peripheral arterial disease. 15.  Abdominal aortic aneurysm. 16.  Proximal right common iliac artery dissection, 2008.    SOCIAL HISTORY:  The patient quit smoking approximately 20 years ago.  Denies alcohol use.    FAMILY HISTORY:  Mother died in her 61s, fatal MI.    REVIEW OF SYSTEMS:  Denies a history of prior MI, stroke or diabetes.  Denies any recent development of exertional  angina pectoris.  The remaining systems reviewed are negative.    PHYSICAL EXAMINATION:  Vital signs:  Blood pressure currently 139/61, pulse currently in the 90-115 range, temperature afebrile, oxygen saturation 95% on 2 liters, weight 250 pounds.  General:  A 79 year old male, morbidly obese lying supine in no distress.  HEENT:  Normocephalic and atraumatic.  PERRLA.  EOMI. Neck:  Palpable ballotable pulses without bruits, no jugular venous distension at 60 degrees.  Lungs:  Faint basilar crackles, no wheezes.  Heart: Irregularly irregular.  No significant murmurs.  Abdomen:  Protuberant, soft and nontender.  Palpable ballottable femoral pulses with right-sided bruits; 1+ bilateral edema; bandaged left foot.  Skin: Warm and dry.  Musculoskeletal:  No obvious deformity.  Neurological:  Alert and oriented.    LABORATORY AND RADIOLOGICAL DATA:  Chest x-ray with mild left lower lobe opacity, atelectasis/scarring versus infiltrate.    CT angiogram of the abdomen showed AAA at level of takeoff of inferior mesenteric artery (4.7 x 4.9 cm versus prior (2008) 3.2 x 3.5 cm).  No evidence of impending rupture, small chronic focal dissection of proximal right common iliac artery (unchanged from 2008); new, 8 mm exophytic left kidney structure.    Troponin I 6.43.  D-dimer 0.45.  INR 3.6.  BNP 350.  Sodium 135, potassium 4.0, BUN 45, creatinine 2.1 (GFR 30) and glucose 153.  Elevated AST 65.  WBC 8300, hemoglobin 13, hematocrit 40 and platelets 240,000.    IMPRESSION: 1.  Acute non-ST elevation myocardial infarction. a.  No previously documented coronary artery disease. b.  Negative GXT Cardiolite in 2006. c.  Transient left bundle branch block. 2.   Atrial flutter with aortic valve replacement. a.  Initially documented in 2002. 3.   History of normal left ventricular function. 4.   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 a.  Coumadin anticoagulation. 5.   Peripheral arterial  disease. a.  Abdominal aortic aneurysm 4.7 x 4.9 cm, enlarge compared to prior study in 2008. b.  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 6.   Hypertension. 7.   Left kidney lesion (  new). 8.   Chronic kidney disease.    PLAN:  Arrangements have been made to transfer the patient directly to the intensive care unit at [facility] for continued close monitoring and aggressive management of chest pain in the setting of dynamic EKG changes and abnormal troponins.  The patient is currently hemodynamically stable but continues to complain of mild residual discomfort.  Therefore, recommendation is to continue management with aspirin, morphine and IV nitroglycerin and initiate beta blocker therapy with IV Lopressor, to be given prior to transfer.  We will also plan on starting intravenous heparin therapy, once the INR is at or below 2.0.   Statin therapy will also be initiated.  The plan is to try to treat his pain and try to defer emergent cardiac catheterization, given his multiple complicating factors of therapeutic INR at present in addition to chronic underlying kidney disease.  Of note, recommendation would be to limit contrast dye load including deferring left ventriculogram.  A complete echocardiogram will be ordered for assessment of LV function.  The patient will also need further evaluation of his noted abdominal aortic aneurysm, which has progressed since the prior study in 2008. He will also need further evaluation of the newly documented left kidney lesion, by CT angiography.    Of note, the patient currently remains hemodynamically stable and in fact, his heart rate has settled in the 60 beats per minute range.  He did state that he took his home dose of diltiazem 360 mg prior to arrival.  Recommendation therefore, is to continue him on this home medication for ongoing rate control of his recurrent atrial flutter.  Given this, we will have low dose  IV Lopressor available, if needed.    The patient was seen and examined in conjunction with Dr. Diona Browner.  Extensive review was done of his prior hospitalization records.                                                                                             __________________________________                                              Prescott Parma, PA-C                           -------------------------------------------------------                                              Nona Dell, MD, [facility]   Attending note:  Patient seen and examined. Reviewed records and discussed the case with Mr. Magnus Sinning, full note outlined above - please review for details. Mr. Giroux presents with intermittent chest pain over the last week, much worse this morning. Noted to be in rapid atrial flutter at presentation, with a history of proximal atrial flutter over the last several years (  not followed by cardiology). ECG showed a transient incomplete left bundle branch block, also significant ST segment changes consistent with ischemia. Troponin I consistent with ACS. He is concurrently on Coumadin with recent diagnosis of DVT, followed by primary care physician. INR supratherapeutic as noted above. On my examination, patient much more comfortable, heart rate in the 60s, chest pain nearly resolved and otherwise hemodynamically stable. Lungs diminished but clear, cardiac exam without gallop.  Overall complex situation, all things considered. Patient is being transferred to the CCU at [facility] for further management by the [facility] service. Coumadin will be held, plan to bridge with heparin once INR less than 2.0. Would continue his heart rate control regimen, consider additional beta blocker if needed. Other issues that will need to be addressed include documentation of progressive AAA by recent CT of the abdomen, also finding of a left renal mass. Present creatinine 2.1, duration uncertain. If coronary  angiography is undertaken, would limit contrast dye, avoid LV gram. Echocardiogram to be obtained. Also need to keep in mind recent diagnosis of DVT and anticoagulation as noted above particularly as it relates to consideration for revascularization and other antiplatelet agents.   Jonelle Sidle, M.D., F.A.C.C.

## 2013-05-12 NOTE — Progress Notes (Signed)
Patient arrived from [facility] for NSTEMI, rapid atrial flutter. Trop-I > 6. EKG with prominent ST depressions V4-V6, I, aVL in the setting of rapid atrial flutter (133 bpm). Improved with rate-control. Currently HR 60s, atrial flutter, 3:1 conduction. Chest pain currently 2-3/10. Will up-titrate NTG gtt. Continue morphine IV PRN. Discussed plans for cath with Dr. Elease Hashimoto. There are several challenges including acute renal insufficiency (Cr 2.1 today, newly discovered L renal mass), supratherapeutic INR, recent h/o DVT and atrial flutter requiring chronic anticoagulation. Will plan to reverse Coumadin-induced coagulapathy to allow for cardiac catheterization (INR goal < 1.6).  Plan administer vitamin K 1 mg IV x 1, 2 units FFP, monitor serial PT/INRs and request heparin per pharmacy management once INR < 2. Tentative cath orders are in place for tomorrow, however this may need to be postponed until renal function and coagulopathy improve. Aim in the interim will be for chest pain management and rate-control of atrial flutter.    Jacqulyn Bath, PA-C 05/12/2013 4:19 PM   Attending Note:   The patient was seen and examined.  Agree with assessment and plan as noted above.  Changes made to the above note as needed.  Mr. Stearns has had chest burning for the past week or so.  The pain acutely worsened this am.  The intense pain has improved but he still has some burning.    The A-flutter rate is now well controlled.   Will give 2 units of vit K.  Will try to get INR < 2.0 for probable cath tomorrow.   His profound ECG changes have improved but he still has mild ST depression.  He does not appear to be in any acute distress.   He also has CKD - Cr. = 2.1 at [facility].  Repeat lab work in the am.   Vesta Mixer, Montez Hageman., MD, [facility] 05/12/2013, 4:24 PM

## 2013-05-12 NOTE — Progress Notes (Signed)
  Echocardiogram 2D Echocardiogram has been performed.  Cathie Beams 05/12/2013, 4:12 PM

## 2013-05-12 NOTE — Progress Notes (Signed)
CRITICAL VALUE ALERT  Critical value received:  Troponin 6.21  Date of notification:  05/12/2013  Time of notification:  1830  Critical value read back:yes  Nurse who received alert:  Gaspar Garbe  MD notified (1st page):  Per Protocol  Time of first page:  N/A  MD notified (2nd page):  Time of second page:  Responding MD:  Per Protocol  Time MD responded: N/A

## 2013-05-12 NOTE — Progress Notes (Signed)
Patient called out very anxious, c/o chest pain, back pain, left shoulder and left arm pain. Morphine IV administered, nitroglycerin increased to , O2 increased to 4L. Metoprolol iv 5mg  administered also. HR in 120's and BP 160/96 MD notified.

## 2013-05-13 ENCOUNTER — Ambulatory Visit (HOSPITAL_COMMUNITY): Admission: RE | Admit: 2013-05-13 | Payer: Medicare Other | Source: Ambulatory Visit | Admitting: Cardiovascular Disease

## 2013-05-13 ENCOUNTER — Encounter (HOSPITAL_COMMUNITY)
Admission: EM | Disposition: A | Discharge status: Rehabilitation Facility | Payer: Self-pay | Source: Other Acute Inpatient Hospital | Attending: Surgery

## 2013-05-13 ENCOUNTER — Inpatient Hospital Stay (HOSPITAL_COMMUNITY): Payer: Medicare Other | Admitting: Anesthesiology

## 2013-05-13 ENCOUNTER — Inpatient Hospital Stay (HOSPITAL_COMMUNITY): Payer: Medicare Other

## 2013-05-13 ENCOUNTER — Other Ambulatory Visit: Payer: Self-pay

## 2013-05-13 ENCOUNTER — Encounter (HOSPITAL_COMMUNITY): Payer: Self-pay | Admitting: Anesthesiology

## 2013-05-13 DIAGNOSIS — I509 Heart failure, unspecified: Secondary | ICD-10-CM

## 2013-05-13 DIAGNOSIS — I214 Non-ST elevation (NSTEMI) myocardial infarction: Secondary | ICD-10-CM

## 2013-05-13 DIAGNOSIS — J9600 Acute respiratory failure, unspecified whether with hypoxia or hypercapnia: Secondary | ICD-10-CM

## 2013-05-13 DIAGNOSIS — I2510 Atherosclerotic heart disease of native coronary artery without angina pectoris: Secondary | ICD-10-CM

## 2013-05-13 DIAGNOSIS — I5043 Acute on chronic combined systolic (congestive) and diastolic (congestive) heart failure: Secondary | ICD-10-CM

## 2013-05-13 DIAGNOSIS — J189 Pneumonia, unspecified organism: Secondary | ICD-10-CM

## 2013-05-13 HISTORY — PX: CORONARY ARTERY BYPASS GRAFT: SHX141

## 2013-05-13 HISTORY — PX: LEFT HEART CATHETERIZATION WITH CORONARY ANGIOGRAM: SHX5451

## 2013-05-13 LAB — POCT I-STAT 3, ART BLOOD GAS (G3+)
Acid-base deficit: 2 mmol/L (ref 0.0–2.0)
Acid-base deficit: 2 mmol/L (ref 0.0–2.0)
Acid-base deficit: 4 mmol/L — ABNORMAL HIGH (ref 0.0–2.0)
Acid-base deficit: 1 mmol/L (ref 0.0–2.0)
Bicarbonate: 26 mEq/L — ABNORMAL HIGH (ref 20.0–24.0)
Bicarbonate: 24.7 mEq/L — ABNORMAL HIGH (ref 20.0–24.0)
Bicarbonate: 22.7 mEq/L (ref 20.0–24.0)
Bicarbonate: 21.8 mEq/L (ref 20.0–24.0)
O2 Saturation: 66 %
O2 Saturation: 95 %
O2 Saturation: 92 %
O2 Saturation: 85 %
O2 Saturation: 100 %
TCO2: 25 mmol/L (ref 0–100)
TCO2: 28 mmol/L (ref 0–100)
TCO2: 22 mmol/L (ref 0–100)
TCO2: 24 mmol/L (ref 0–100)
TCO2: 24 mmol/L (ref 0–100)
TCO2: 23 mmol/L (ref 0–100)
pCO2 arterial: 50.1 mmHg — ABNORMAL HIGH (ref 35.0–45.0)
pCO2 arterial: 60.1 mmHg (ref 35.0–45.0)
pCO2 arterial: 30.8 mmHg — ABNORMAL LOW (ref 35.0–45.0)
pCO2 arterial: 47 mmHg — ABNORMAL HIGH (ref 35.0–45.0)
pCO2 arterial: 45.7 mmHg — ABNORMAL HIGH (ref 35.0–45.0)
pH, Arterial: 7.291 — ABNORMAL LOW (ref 7.350–7.450)
pH, Arterial: 7.3 — ABNORMAL LOW (ref 7.350–7.450)
pH, Arterial: 7.326 — ABNORMAL LOW (ref 7.350–7.450)
pH, Arterial: 7.284 — ABNORMAL LOW (ref 7.350–7.450)
pO2, Arterial: 39 mmHg — CL (ref 80.0–100.0)
pO2, Arterial: 91 mmHg (ref 80.0–100.0)
pO2, Arterial: 62 mmHg — ABNORMAL LOW (ref 80.0–100.0)
pO2, Arterial: 348 mmHg — ABNORMAL HIGH (ref 80.0–100.0)

## 2013-05-13 LAB — POCT I-STAT GLUCOSE
Glucose, Bld: 111 mg/dL — ABNORMAL HIGH (ref 70–99)
Operator id: 3406

## 2013-05-13 LAB — CBC
MCH: 26.6 pg (ref 26.0–34.0)
MCHC: 31.8 g/dL (ref 30.0–36.0)
MCV: 83.8 fL (ref 78.0–100.0)
Platelets: 121 10*3/uL — ABNORMAL LOW (ref 150–400)
RDW: 16.3 % — ABNORMAL HIGH (ref 11.5–15.5)
WBC: 7.8 10*3/uL (ref 4.0–10.5)

## 2013-05-13 LAB — POCT I-STAT 4, (NA,K, GLUC, HGB,HCT)
Glucose, Bld: 116 mg/dL — ABNORMAL HIGH (ref 70–99)
Glucose, Bld: 106 mg/dL — ABNORMAL HIGH (ref 70–99)
Glucose, Bld: 102 mg/dL — ABNORMAL HIGH (ref 70–99)
HCT: 37 % — ABNORMAL LOW (ref 39.0–52.0)
HCT: 30 % — ABNORMAL LOW (ref 39.0–52.0)
HCT: 28 % — ABNORMAL LOW (ref 39.0–52.0)
Hemoglobin: 12.6 g/dL — ABNORMAL LOW (ref 13.0–17.0)
Hemoglobin: 11.2 g/dL — ABNORMAL LOW (ref 13.0–17.0)
Potassium: 4.2 mEq/L (ref 3.5–5.1)
Potassium: 4.3 mEq/L (ref 3.5–5.1)
Sodium: 139 mEq/L (ref 135–145)

## 2013-05-13 LAB — MAGNESIUM: Magnesium: 2.7 mg/dL — ABNORMAL HIGH (ref 1.5–2.5)

## 2013-05-13 LAB — LIPID PANEL
Cholesterol: 172 mg/dL (ref 0–200)
HDL: 47 mg/dL (ref >39)
Triglycerides: 141 mg/dL (ref <150)

## 2013-05-13 LAB — PROTIME-INR
INR: 1.72 — ABNORMAL HIGH (ref 0.00–1.49)
Prothrombin Time: 19.7 seconds — ABNORMAL HIGH (ref 11.6–15.2)

## 2013-05-13 LAB — PREPARE FRESH FROZEN PLASMA: Unit division: 0

## 2013-05-13 LAB — BASIC METABOLIC PANEL
CO2: 24 mEq/L (ref 19–32)
Chloride: 104 mEq/L (ref 96–112)
Glucose, Bld: 148 mg/dL — ABNORMAL HIGH (ref 70–99)
Potassium: 4.7 mEq/L (ref 3.5–5.1)
Sodium: 139 mEq/L (ref 135–145)

## 2013-05-13 LAB — POCT I-STAT 3, VENOUS BLOOD GAS (G3P V)
O2 Saturation: 36 %
TCO2: 24 mmol/L (ref 0–100)
pH, Ven: 7.363 — ABNORMAL HIGH (ref 7.250–7.300)

## 2013-05-13 LAB — TROPONIN I: Troponin I: 8.93 ng/mL (ref <0.30)

## 2013-05-13 LAB — SURGICAL PCR SCREEN: Staphylococcus aureus: POSITIVE — AB

## 2013-05-13 SURGERY — LEFT HEART CATHETERIZATION WITH CORONARY ANGIOGRAM
Anesthesia: LOCAL

## 2013-05-13 SURGERY — CORONARY ARTERY BYPASS GRAFTING (CABG)
Anesthesia: General | Site: Chest | Wound class: Clean

## 2013-05-13 MED ORDER — LACTATED RINGERS IV SOLN
INTRAVENOUS | Status: DC | PRN
  Administered 2013-05-13 (×2): via INTRAVENOUS

## 2013-05-13 MED ORDER — ACETAMINOPHEN 160 MG/5ML PO SOLN
975.0000 mg | Freq: Four times a day (QID) | ORAL | Status: DC
  Administered 2013-05-14 – 2013-05-16 (×6): 975 mg
  Filled 2013-05-13 (×6): qty 40.6

## 2013-05-13 MED ORDER — FUROSEMIDE 10 MG/ML IJ SOLN
80.0000 mg | Freq: Two times a day (BID) | INTRAMUSCULAR | Status: DC
Start: 2013-05-13 — End: 2013-05-13

## 2013-05-13 MED ORDER — THROMBIN 20000 UNITS EX SOLR
CUTANEOUS | Status: AC
  Filled 2013-05-13: qty 20000

## 2013-05-13 MED ORDER — METOPROLOL TARTRATE 25 MG/10 ML ORAL SUSPENSION
12.5000 mg | Freq: Two times a day (BID) | ORAL | Status: DC
  Administered 2013-05-15: 12.5 mg
  Filled 2013-05-13 (×7): qty 5

## 2013-05-13 MED ORDER — MILRINONE IN DEXTROSE 20 MG/100ML IV SOLN
0.1250 ug/kg/min | INTRAVENOUS | Status: AC
  Administered 2013-05-13: .3 ug/kg/min via INTRAVENOUS
  Filled 2013-05-13: qty 100

## 2013-05-13 MED ORDER — SODIUM CHLORIDE 0.9 % IV SOLN: INTRAVENOUS | Status: DC

## 2013-05-13 MED ORDER — NOREPINEPHRINE BITARTRATE 1 MG/ML IJ SOLN: 2.0000 ug/min | INTRAVENOUS | Status: DC

## 2013-05-13 MED ORDER — LIDOCAINE HCL (CARDIAC) 20 MG/ML IV SOLN
INTRAVENOUS | Status: AC
  Filled 2013-05-13: qty 5

## 2013-05-13 MED ORDER — EPINEPHRINE HCL 1 MG/ML IJ SOLN: 0.5000 ug/min | INTRAVENOUS | Status: DC

## 2013-05-13 MED ORDER — NITROGLYCERIN IN D5W 200-5 MCG/ML-% IV SOLN: 0.0000 ug/min | INTRAVENOUS | Status: DC

## 2013-05-13 MED ORDER — ASPIRIN EC 325 MG PO TBEC
325.0000 mg | DELAYED_RELEASE_TABLET | Freq: Every day | ORAL | Status: DC
  Administered 2013-05-16 – 2013-05-17 (×2): 325 mg via ORAL
  Filled 2013-05-13 (×6): qty 1

## 2013-05-13 MED ORDER — BISACODYL 10 MG RE SUPP
10.0000 mg | Freq: Every day | RECTAL | Status: DC
  Administered 2013-05-14 – 2013-05-18 (×3): 10 mg via RECTAL
  Filled 2013-05-13 (×4): qty 1

## 2013-05-13 MED ORDER — ACETAMINOPHEN 10 MG/ML IV SOLN
1000.0000 mg | Freq: Once | INTRAVENOUS | Status: AC
  Administered 2013-05-13: 1000 mg via INTRAVENOUS
  Filled 2013-05-13: qty 100

## 2013-05-13 MED ORDER — PHENYLEPHRINE HCL 10 MG/ML IJ SOLN
0.0000 ug/min | INTRAVENOUS | Status: DC
  Filled 2013-05-13: qty 2

## 2013-05-13 MED ORDER — DEXTROSE 5 % IV SOLN
750.0000 mg | INTRAVENOUS | Status: DC
  Filled 2013-05-13: qty 750

## 2013-05-13 MED ORDER — PLASMA-LYTE 148 IV SOLN: INTRAVENOUS | Status: DC

## 2013-05-13 MED ORDER — HEPARIN SODIUM (PORCINE) 1000 UNIT/ML IJ SOLN
INTRAMUSCULAR | Status: DC | PRN
  Administered 2013-05-13: 25000 [IU] via INTRAVENOUS

## 2013-05-13 MED ORDER — SUCCINYLCHOLINE CHLORIDE 20 MG/ML IJ SOLN
INTRAMUSCULAR | Status: AC
  Administered 2013-05-13: 05:00:00
  Filled 2013-05-13: qty 1

## 2013-05-13 MED ORDER — INSULIN REGULAR BOLUS VIA INFUSION
0.0000 [IU] | Freq: Three times a day (TID) | INTRAVENOUS | Status: DC
  Filled 2013-05-13: qty 10

## 2013-05-13 MED ORDER — FENTANYL CITRATE 0.05 MG/ML IJ SOLN
INTRAMUSCULAR | Status: AC
  Administered 2013-05-13: 100 ug
  Filled 2013-05-13: qty 2

## 2013-05-13 MED ORDER — SODIUM CHLORIDE 0.9 % IV SOLN
INTRAVENOUS | Status: AC
  Administered 2013-05-13: 70 mL/h via INTRAVENOUS
  Filled 2013-05-13: qty 40

## 2013-05-13 MED ORDER — LACTATED RINGERS IV SOLN: 500.0000 mL | Freq: Once | INTRAVENOUS | Status: AC | PRN

## 2013-05-13 MED ORDER — SODIUM CHLORIDE 0.9 % IJ SOLN: 3.0000 mL | INTRAMUSCULAR | Status: DC | PRN

## 2013-05-13 MED ORDER — SODIUM CHLORIDE 0.9 % IV SOLN
INTRAVENOUS | Status: AC
  Administered 2013-05-13: 1.8 [IU]/h via INTRAVENOUS
  Filled 2013-05-13: qty 1

## 2013-05-13 MED ORDER — ALBUMIN HUMAN 5 % IV SOLN
INTRAVENOUS | Status: DC | PRN
  Administered 2013-05-13: 18:00:00 via INTRAVENOUS

## 2013-05-13 MED ORDER — BISACODYL 5 MG PO TBEC
10.0000 mg | DELAYED_RELEASE_TABLET | Freq: Every day | ORAL | Status: DC
  Administered 2013-05-16: 10 mg via ORAL
  Filled 2013-05-13 (×2): qty 2

## 2013-05-13 MED ORDER — SODIUM CHLORIDE 0.9 % IV SOLN: 250.0000 mL | INTRAVENOUS | Status: DC | PRN

## 2013-05-13 MED ORDER — ACETAMINOPHEN 500 MG PO TABS
1000.0000 mg | ORAL_TABLET | Freq: Four times a day (QID) | ORAL | Status: AC
  Administered 2013-05-16 – 2013-05-18 (×8): 1000 mg via ORAL
  Filled 2013-05-13 (×19): qty 2

## 2013-05-13 MED ORDER — MORPHINE SULFATE 2 MG/ML IJ SOLN
1.0000 mg | INTRAMUSCULAR | Status: AC | PRN
  Administered 2013-05-14: 4 mg via INTRAVENOUS
  Filled 2013-05-13: qty 2

## 2013-05-13 MED ORDER — MAGNESIUM SULFATE 50 % IJ SOLN: 40.0000 meq | INTRAMUSCULAR | Status: DC

## 2013-05-13 MED ORDER — VANCOMYCIN HCL 10 G IV SOLR: 1250.0000 mg | INTRAVENOUS | Status: DC

## 2013-05-13 MED ORDER — NOREPINEPHRINE BITARTRATE 1 MG/ML IJ SOLN
0.0000 ug/min | INTRAVENOUS | Status: DC
  Administered 2013-05-14: 9 ug/min via INTRAVENOUS
  Administered 2013-05-14: 10 ug/min via INTRAVENOUS
  Administered 2013-05-15: 5 ug/min via INTRAVENOUS
  Filled 2013-05-13 (×3): qty 8

## 2013-05-13 MED ORDER — HEMOSTATIC AGENTS (NO CHARGE) OPTIME
TOPICAL | Status: DC | PRN
  Administered 2013-05-13: 1 via TOPICAL

## 2013-05-13 MED ORDER — THROMBIN 20000 UNITS EX SOLR
OROMUCOSAL | Status: DC | PRN
  Administered 2013-05-13 (×3): via TOPICAL

## 2013-05-13 MED ORDER — PLASMA-LYTE 148 IV SOLN
INTRAVENOUS | Status: DC | PRN
  Administered 2013-05-13: 14:00:00

## 2013-05-13 MED ORDER — METOPROLOL TARTRATE 12.5 MG HALF TABLET
12.5000 mg | ORAL_TABLET | Freq: Two times a day (BID) | ORAL | Status: DC
  Administered 2013-05-16 – 2013-05-17 (×2): 12.5 mg via ORAL
  Filled 2013-05-13 (×11): qty 1

## 2013-05-13 MED ORDER — OXYCODONE HCL 5 MG PO TABS
5.0000 mg | ORAL_TABLET | ORAL | Status: DC | PRN
  Administered 2013-05-16 – 2013-05-18 (×11): 10 mg via ORAL
  Filled 2013-05-13 (×11): qty 2

## 2013-05-13 MED ORDER — FUROSEMIDE 10 MG/ML IJ SOLN
80.0000 mg | Freq: Once | INTRAMUSCULAR | Status: AC
  Administered 2013-05-13: 80 mg via INTRAVENOUS

## 2013-05-13 MED ORDER — LACTATED RINGERS IV SOLN: INTRAVENOUS | Status: DC

## 2013-05-13 MED ORDER — PHENYLEPHRINE HCL 10 MG/ML IJ SOLN: 30.0000 ug/min | INTRAVENOUS | Status: DC

## 2013-05-13 MED ORDER — PANTOPRAZOLE SODIUM 40 MG PO TBEC: 40.0000 mg | DELAYED_RELEASE_TABLET | Freq: Every day | ORAL | Status: DC

## 2013-05-13 MED ORDER — VECURONIUM BROMIDE 10 MG IV SOLR
INTRAVENOUS | Status: DC | PRN
  Administered 2013-05-13: 5 mg via INTRAVENOUS
  Administered 2013-05-13 (×3): 2 mg via INTRAVENOUS
  Administered 2013-05-13: 5 mg via INTRAVENOUS

## 2013-05-13 MED ORDER — MIDAZOLAM HCL 2 MG/2ML IJ SOLN
INTRAMUSCULAR | Status: AC
  Administered 2013-05-13: 2 mg
  Filled 2013-05-13: qty 2

## 2013-05-13 MED ORDER — SODIUM CHLORIDE 0.9 % IJ SOLN
3.0000 mL | Freq: Two times a day (BID) | INTRAMUSCULAR | Status: DC
  Administered 2013-05-14 – 2013-05-19 (×11): 3 mL via INTRAVENOUS

## 2013-05-13 MED ORDER — SODIUM CHLORIDE 0.9 % IV SOLN
INTRAVENOUS | Status: DC
  Filled 2013-05-13: qty 30

## 2013-05-13 MED ORDER — INSULIN ASPART 100 UNIT/ML ~~LOC~~ SOLN
0.0000 [IU] | SUBCUTANEOUS | Status: DC
  Administered 2013-05-13: 2 [IU] via SUBCUTANEOUS

## 2013-05-13 MED ORDER — LACTATED RINGERS IV SOLN
INTRAVENOUS | Status: DC | PRN
  Administered 2013-05-13 (×2): via INTRAVENOUS

## 2013-05-13 MED ORDER — VANCOMYCIN HCL 10 G IV SOLR
1500.0000 mg | INTRAVENOUS | Status: DC
  Administered 2013-05-13 – 2013-05-14 (×2): 1500 mg via INTRAVENOUS
  Filled 2013-05-13 (×3): qty 1500

## 2013-05-13 MED ORDER — ROCURONIUM BROMIDE 50 MG/5ML IV SOLN
INTRAVENOUS | Status: AC
  Filled 2013-05-13: qty 2

## 2013-05-13 MED ORDER — HEPARIN (PORCINE) IN NACL 2-0.9 UNIT/ML-% IJ SOLN
INTRAMUSCULAR | Status: AC
  Filled 2013-05-13: qty 1000

## 2013-05-13 MED ORDER — PIPERACILLIN-TAZOBACTAM 3.375 G IVPB
3.3750 g | Freq: Three times a day (TID) | INTRAVENOUS | Status: DC
  Administered 2013-05-13 – 2013-05-18 (×14): 3.375 g via INTRAVENOUS
  Filled 2013-05-13 (×19): qty 50

## 2013-05-13 MED ORDER — DEXTROSE 5 % IV SOLN: 750.0000 mg | INTRAVENOUS | Status: DC

## 2013-05-13 MED ORDER — DOPAMINE-DEXTROSE 3.2-5 MG/ML-% IV SOLN
2.0000 ug/kg/min | INTRAVENOUS | Status: AC
  Administered 2013-05-13: 2 ug/kg/min via INTRAVENOUS
  Filled 2013-05-13: qty 250

## 2013-05-13 MED ORDER — ALBUMIN HUMAN 5 % IV SOLN
250.0000 mL | INTRAVENOUS | Status: AC | PRN
  Administered 2013-05-13 (×2): 250 mL via INTRAVENOUS

## 2013-05-13 MED ORDER — EPINEPHRINE HCL 1 MG/ML IJ SOLN
0.5000 ug/min | INTRAVENOUS | Status: DC
  Filled 2013-05-13: qty 4

## 2013-05-13 MED ORDER — LACTATED RINGERS IV SOLN
INTRAVENOUS | Status: DC | PRN
  Administered 2013-05-13: 13:00:00 via INTRAVENOUS

## 2013-05-13 MED ORDER — PLASMA-LYTE 148 IV SOLN
INTRAVENOUS | Status: DC
  Filled 2013-05-13: qty 2.5

## 2013-05-13 MED ORDER — DEXTROSE 5 % IV SOLN: 1.5000 g | INTRAVENOUS | Status: DC

## 2013-05-13 MED ORDER — DOPAMINE-DEXTROSE 3.2-5 MG/ML-% IV SOLN: 2.0000 ug/kg/min | INTRAVENOUS | Status: DC

## 2013-05-13 MED ORDER — MIDAZOLAM HCL 2 MG/2ML IJ SOLN
2.0000 mg | INTRAMUSCULAR | Status: DC | PRN
  Administered 2013-05-13 (×2): 2 mg via INTRAVENOUS
  Filled 2013-05-13 (×2): qty 2

## 2013-05-13 MED ORDER — NOREPINEPHRINE BITARTRATE 1 MG/ML IJ SOLN
2.0000 ug/min | INTRAVENOUS | Status: DC
  Filled 2013-05-13 (×2): qty 4

## 2013-05-13 MED ORDER — NITROGLYCERIN IN D5W 200-5 MCG/ML-% IV SOLN
2.0000 ug/min | INTRAVENOUS | Status: AC
  Administered 2013-05-13: 5 ug/min via INTRAVENOUS
  Filled 2013-05-13: qty 250

## 2013-05-13 MED ORDER — SODIUM CHLORIDE 0.9 % IV SOLN
25.0000 ug/h | INTRAVENOUS | Status: DC
  Administered 2013-05-13: 25 ug/h via INTRAVENOUS
  Filled 2013-05-13: qty 50

## 2013-05-13 MED ORDER — ROCURONIUM BROMIDE 100 MG/10ML IV SOLN
INTRAVENOUS | Status: DC | PRN
  Administered 2013-05-13: 50 mg via INTRAVENOUS

## 2013-05-13 MED ORDER — ARTIFICIAL TEARS OP OINT
TOPICAL_OINTMENT | OPHTHALMIC | Status: DC | PRN
  Administered 2013-05-13: 1 via OPHTHALMIC

## 2013-05-13 MED ORDER — NITROGLYCERIN IN D5W 200-5 MCG/ML-% IV SOLN: 2.0000 ug/min | INTRAVENOUS | Status: DC

## 2013-05-13 MED ORDER — DEXMEDETOMIDINE HCL IN NACL 200 MCG/50ML IV SOLN
0.1000 ug/kg/h | INTRAVENOUS | Status: DC
  Administered 2013-05-13 (×2): 0.7 ug/kg/h via INTRAVENOUS
  Filled 2013-05-13 (×2): qty 50

## 2013-05-13 MED ORDER — METOCLOPRAMIDE HCL 5 MG/ML IJ SOLN
10.0000 mg | Freq: Four times a day (QID) | INTRAMUSCULAR | Status: AC
  Administered 2013-05-13 – 2013-05-14 (×4): 10 mg via INTRAVENOUS
  Filled 2013-05-13 (×4): qty 2

## 2013-05-13 MED ORDER — ASPIRIN 81 MG PO CHEW
324.0000 mg | CHEWABLE_TABLET | Freq: Every day | ORAL | Status: DC
  Administered 2013-05-15: 324 mg
  Filled 2013-05-13: qty 4

## 2013-05-13 MED ORDER — VANCOMYCIN HCL IN DEXTROSE 1-5 GM/200ML-% IV SOLN: 1000.0000 mg | Freq: Once | INTRAVENOUS | Status: DC

## 2013-05-13 MED ORDER — SODIUM CHLORIDE 0.9 % IV SOLN
1.0000 mg/h | INTRAVENOUS | Status: DC
  Administered 2013-05-13: 1 mg/h via INTRAVENOUS
  Filled 2013-05-13: qty 10

## 2013-05-13 MED ORDER — SODIUM CHLORIDE 0.9 % IJ SOLN
3.0000 mL | Freq: Two times a day (BID) | INTRAMUSCULAR | Status: DC
  Administered 2013-05-13: 12:00:00 via INTRAVENOUS

## 2013-05-13 MED ORDER — SODIUM CHLORIDE 0.9 % IV SOLN
INTRAVENOUS | Status: DC
  Administered 2013-05-13: 12:00:00 via INTRAVENOUS

## 2013-05-13 MED ORDER — HEPARIN (PORCINE) IN NACL 100-0.45 UNIT/ML-% IJ SOLN
1500.0000 [IU]/h | INTRAMUSCULAR | Status: DC
  Administered 2013-05-13: 1500 [IU]/h via INTRAVENOUS
  Filled 2013-05-13 (×2): qty 250

## 2013-05-13 MED ORDER — ALUM & MAG HYDROXIDE-SIMETH 200-200-20 MG/5ML PO SUSP
30.0000 mL | ORAL | Status: DC | PRN
  Filled 2013-05-13: qty 30

## 2013-05-13 MED ORDER — POTASSIUM CHLORIDE 2 MEQ/ML IV SOLN
80.0000 meq | INTRAVENOUS | Status: DC
  Filled 2013-05-13 (×2): qty 40

## 2013-05-13 MED ORDER — ASPIRIN 81 MG PO CHEW: 324.0000 mg | CHEWABLE_TABLET | ORAL | Status: DC

## 2013-05-13 MED ORDER — DOPAMINE-DEXTROSE 3.2-5 MG/ML-% IV SOLN
0.0000 ug/kg/min | INTRAVENOUS | Status: DC
  Administered 2013-05-14 – 2013-05-15 (×2): 5 ug/kg/min via INTRAVENOUS
  Filled 2013-05-13 (×3): qty 250

## 2013-05-13 MED ORDER — BIOTENE DRY MOUTH MT LIQD
1.0000 "application " | Freq: Four times a day (QID) | OROMUCOSAL | Status: DC
  Administered 2013-05-13: 15 mL via OROMUCOSAL

## 2013-05-13 MED ORDER — PHENYLEPHRINE HCL 10 MG/ML IJ SOLN
30.0000 ug/min | INTRAVENOUS | Status: AC
  Administered 2013-05-13: 50 ug/min via INTRAVENOUS
  Filled 2013-05-13: qty 2

## 2013-05-13 MED ORDER — VANCOMYCIN HCL 10 G IV SOLR: 1500.0000 mg | INTRAVENOUS | Status: DC

## 2013-05-13 MED ORDER — MORPHINE SULFATE 2 MG/ML IJ SOLN
2.0000 mg | INTRAMUSCULAR | Status: DC | PRN
  Administered 2013-05-14 – 2013-05-15 (×6): 4 mg via INTRAVENOUS
  Administered 2013-05-16 (×2): 2 mg via INTRAVENOUS
  Filled 2013-05-13 (×4): qty 2
  Filled 2013-05-13: qty 1
  Filled 2013-05-13 (×3): qty 2

## 2013-05-13 MED ORDER — MIDAZOLAM HCL 5 MG/5ML IJ SOLN
INTRAMUSCULAR | Status: DC | PRN
  Administered 2013-05-13: 3 mg via INTRAVENOUS
  Administered 2013-05-13: 2 mg via INTRAVENOUS
  Administered 2013-05-13: 3 mg via INTRAVENOUS
  Administered 2013-05-13: 4 mg via INTRAVENOUS

## 2013-05-13 MED ORDER — 0.9 % SODIUM CHLORIDE (POUR BTL) OPTIME
TOPICAL | Status: DC | PRN
  Administered 2013-05-13: 5000 mL

## 2013-05-13 MED ORDER — ATORVASTATIN CALCIUM 80 MG PO TABS
80.0000 mg | ORAL_TABLET | Freq: Every day | ORAL | Status: DC
  Administered 2013-05-14 – 2013-05-17 (×4): 80 mg via ORAL
  Filled 2013-05-13 (×7): qty 1

## 2013-05-13 MED ORDER — LIDOCAINE HCL (PF) 1 % IJ SOLN
INTRAMUSCULAR | Status: AC
  Filled 2013-05-13: qty 30

## 2013-05-13 MED ORDER — ETOMIDATE 2 MG/ML IV SOLN
INTRAVENOUS | Status: AC
  Administered 2013-05-13: 20 mg
  Filled 2013-05-13: qty 20

## 2013-05-13 MED ORDER — NITROGLYCERIN 0.2 MG/ML ON CALL CATH LAB
INTRAVENOUS | Status: AC
  Filled 2013-05-13: qty 1

## 2013-05-13 MED ORDER — MAGNESIUM SULFATE 40 MG/ML IJ SOLN
4.0000 g | Freq: Once | INTRAMUSCULAR | Status: AC
  Administered 2013-05-13: 4 g via INTRAVENOUS
  Filled 2013-05-13: qty 100

## 2013-05-13 MED ORDER — MIDAZOLAM HCL 2 MG/2ML IJ SOLN
2.0000 mg | INTRAMUSCULAR | Status: DC | PRN
  Administered 2013-05-14 – 2013-05-16 (×13): 2 mg via INTRAVENOUS
  Filled 2013-05-13 (×14): qty 2

## 2013-05-13 MED ORDER — SUFENTANIL CITRATE 50 MCG/ML IV SOLN
INTRAVENOUS | Status: DC | PRN
  Administered 2013-05-13 (×2): 20 ug via INTRAVENOUS
  Administered 2013-05-13: 30 ug via INTRAVENOUS
  Administered 2013-05-13: 50 ug via INTRAVENOUS
  Administered 2013-05-13: 20 ug via INTRAVENOUS

## 2013-05-13 MED ORDER — PROTAMINE SULFATE 10 MG/ML IV SOLN
INTRAVENOUS | Status: DC | PRN
  Administered 2013-05-13 (×2): 110 mg via INTRAVENOUS

## 2013-05-13 MED ORDER — DEXMEDETOMIDINE HCL IN NACL 400 MCG/100ML IV SOLN
0.1000 ug/kg/h | INTRAVENOUS | Status: AC
  Administered 2013-05-13: 0.2 ug/kg/h via INTRAVENOUS
  Filled 2013-05-13: qty 100

## 2013-05-13 MED ORDER — DEXMEDETOMIDINE HCL IN NACL 400 MCG/100ML IV SOLN: 0.1000 ug/kg/h | INTRAVENOUS | Status: DC

## 2013-05-13 MED ORDER — FUROSEMIDE 10 MG/ML IJ SOLN
INTRAMUSCULAR | Status: AC
  Filled 2013-05-13: qty 8

## 2013-05-13 MED ORDER — PANTOPRAZOLE SODIUM 40 MG IV SOLR
40.0000 mg | INTRAVENOUS | Status: DC
  Administered 2013-05-13: 40 mg via INTRAVENOUS
  Filled 2013-05-13: qty 40

## 2013-05-13 MED ORDER — METOPROLOL TARTRATE 1 MG/ML IV SOLN
2.5000 mg | INTRAVENOUS | Status: DC | PRN
  Administered 2013-05-14: 5 mg via INTRAVENOUS
  Filled 2013-05-13: qty 5

## 2013-05-13 MED ORDER — SODIUM CHLORIDE 0.9 % IV SOLN
INTRAVENOUS | Status: DC
  Filled 2013-05-13: qty 1

## 2013-05-13 MED ORDER — VANCOMYCIN HCL 10 G IV SOLR
2000.0000 mg | Freq: Once | INTRAVENOUS | Status: AC
  Administered 2013-05-13: 2000 mg via INTRAVENOUS
  Filled 2013-05-13: qty 2000

## 2013-05-13 MED ORDER — ONDANSETRON HCL 4 MG/2ML IJ SOLN
4.0000 mg | Freq: Four times a day (QID) | INTRAMUSCULAR | Status: DC | PRN
  Administered 2013-05-16 – 2013-06-02 (×17): 4 mg via INTRAVENOUS
  Filled 2013-05-13 (×18): qty 2

## 2013-05-13 MED ORDER — MAGNESIUM SULFATE 50 % IJ SOLN
40.0000 meq | INTRAMUSCULAR | Status: DC
  Filled 2013-05-13: qty 10

## 2013-05-13 MED ORDER — POTASSIUM CHLORIDE 10 MEQ/50ML IV SOLN: 10.0000 meq | INTRAVENOUS | Status: AC

## 2013-05-13 MED ORDER — DEXMEDETOMIDINE HCL IN NACL 400 MCG/100ML IV SOLN
0.4000 ug/kg/h | INTRAVENOUS | Status: DC
  Administered 2013-05-14: 1 ug/kg/h via INTRAVENOUS
  Administered 2013-05-14: 0.7 ug/kg/h via INTRAVENOUS
  Administered 2013-05-14 (×4): 1 ug/kg/h via INTRAVENOUS
  Administered 2013-05-15 – 2013-05-16 (×6): 1.2 ug/kg/h via INTRAVENOUS
  Filled 2013-05-13 (×18): qty 100

## 2013-05-13 MED ORDER — SODIUM CHLORIDE 0.45 % IV SOLN
INTRAVENOUS | Status: DC
  Administered 2013-05-13: 21:00:00 via INTRAVENOUS

## 2013-05-13 MED ORDER — FAMOTIDINE IN NACL 20-0.9 MG/50ML-% IV SOLN
20.0000 mg | Freq: Two times a day (BID) | INTRAVENOUS | Status: AC
  Administered 2013-05-13 – 2013-05-14 (×2): 20 mg via INTRAVENOUS
  Filled 2013-05-13: qty 50

## 2013-05-13 MED ORDER — DEXTROSE 5 % IV SOLN: 1.5000 g | Freq: Two times a day (BID) | INTRAVENOUS | Status: DC

## 2013-05-13 MED ORDER — DOCUSATE SODIUM 100 MG PO CAPS: 200.0000 mg | ORAL_CAPSULE | Freq: Every day | ORAL | Status: DC

## 2013-05-13 MED ORDER — DEXTROSE 5 % IV SOLN
1.5000 g | INTRAVENOUS | Status: AC
  Administered 2013-05-13: 1.5 g via INTRAVENOUS
  Filled 2013-05-13: qty 1.5

## 2013-05-13 MED ORDER — CHLORHEXIDINE GLUCONATE 0.12 % MT SOLN
15.0000 mL | Freq: Two times a day (BID) | OROMUCOSAL | Status: DC
  Administered 2013-05-13: 15 mL via OROMUCOSAL
  Filled 2013-05-13: qty 15

## 2013-05-13 MED ORDER — PIPERACILLIN-TAZOBACTAM 3.375 G IVPB 30 MIN
3.3750 g | Freq: Once | INTRAVENOUS | Status: DC
  Filled 2013-05-13: qty 50

## 2013-05-13 MED ORDER — MILRINONE IN DEXTROSE 20 MG/100ML IV SOLN
0.3000 ug/kg/min | INTRAVENOUS | Status: DC
  Administered 2013-05-14 (×2): 0.3 ug/kg/min via INTRAVENOUS
  Filled 2013-05-13 (×3): qty 100

## 2013-05-13 MED ORDER — SODIUM CHLORIDE 0.9 % IV SOLN
250.0000 mL | INTRAVENOUS | Status: DC
  Administered 2013-05-26: 14:00:00 via INTRAVENOUS

## 2013-05-13 SURGICAL SUPPLY — 117 items
ADH SKN CLS APL DERMABOND .7 (GAUZE/BANDAGES/DRESSINGS) ×1
ATTRACTOMAT 16X20 MAGNETIC DRP (DRAPES) ×2 IMPLANT
BAG DECANTER FOR FLEXI CONT (MISCELLANEOUS) ×2 IMPLANT
BANDAGE ELASTIC 4 VELCRO ST LF (GAUZE/BANDAGES/DRESSINGS) ×3 IMPLANT
BANDAGE ELASTIC 6 VELCRO ST LF (GAUZE/BANDAGES/DRESSINGS) ×3 IMPLANT
BANDAGE GAUZE ELAST BULKY 4 IN (GAUZE/BANDAGES/DRESSINGS) ×3 IMPLANT
BASKET HEART (ORDER IN 25'S) (MISCELLANEOUS) ×1
BASKET HEART (ORDER IN 25S) (MISCELLANEOUS) ×1 IMPLANT
BLADE STERNUM SYSTEM 6 (BLADE) ×2 IMPLANT
BLADE SURG 11 STRL SS (BLADE) ×1 IMPLANT
BLADE SURG ROTATE 9660 (MISCELLANEOUS) ×1 IMPLANT
CANISTER SUCTION 2500CC (MISCELLANEOUS) ×2 IMPLANT
CANNULA ARTERIAL NVNT 3/8 22FR (MISCELLANEOUS) ×1 IMPLANT
CANNULA ARTERIAL NVNT 3/8 24FR (CANNULA) ×1 IMPLANT
CANNULA GUNDRY RCSP 15FR (MISCELLANEOUS) ×1 IMPLANT
CANNULA VENOUS LOW PROF 34X46 (CANNULA) ×2 IMPLANT
CATH ROBINSON RED A/P 18FR (CATHETERS) ×5 IMPLANT
CATH THORACIC 28FR (CATHETERS) ×2 IMPLANT
CATH THORACIC 28FR RT ANG (CATHETERS) IMPLANT
CATH THORACIC 36FR (CATHETERS) ×2 IMPLANT
CATH THORACIC 36FR RT ANG (CATHETERS) ×2 IMPLANT
CLIP RETRACTION 3.0MM CORONARY (MISCELLANEOUS) ×2 IMPLANT
CLIP TI MEDIUM 24 (CLIP) IMPLANT
CLIP TI WIDE RED SMALL 24 (CLIP) ×1 IMPLANT
CLOTH BEACON ORANGE TIMEOUT ST (SAFETY) ×2 IMPLANT
COVER SURGICAL LIGHT HANDLE (MISCELLANEOUS) ×2 IMPLANT
CRADLE DONUT ADULT HEAD (MISCELLANEOUS) ×2 IMPLANT
DERMABOND ADVANCED (GAUZE/BANDAGES/DRESSINGS) ×1
DERMABOND ADVANCED .7 DNX12 (GAUZE/BANDAGES/DRESSINGS) IMPLANT
DRAPE CARDIOVASCULAR INCISE (DRAPES) ×2
DRAPE SLUSH/WARMER DISC (DRAPES) IMPLANT
DRAPE SRG 135X102X78XABS (DRAPES) ×1 IMPLANT
DRSG COVADERM 4X14 (GAUZE/BANDAGES/DRESSINGS) ×2 IMPLANT
ELECT CAUTERY BLADE 6.4 (BLADE) ×2 IMPLANT
ELECT REM PT RETURN 9FT ADLT (ELECTROSURGICAL) ×4
ELECTRODE REM PT RTRN 9FT ADLT (ELECTROSURGICAL) ×2 IMPLANT
GLOVE BIO SURGEON STRL SZ 6 (GLOVE) ×4 IMPLANT
GLOVE BIO SURGEON STRL SZ 6.5 (GLOVE) ×4 IMPLANT
GLOVE BIO SURGEON STRL SZ7 (GLOVE) ×2 IMPLANT
GLOVE BIO SURGEON STRL SZ7.5 (GLOVE) IMPLANT
GLOVE BIOGEL PI IND STRL 6 (GLOVE) IMPLANT
GLOVE BIOGEL PI IND STRL 6.5 (GLOVE) IMPLANT
GLOVE BIOGEL PI IND STRL 7.0 (GLOVE) IMPLANT
GLOVE BIOGEL PI IND STRL 7.5 (GLOVE) IMPLANT
GLOVE BIOGEL PI INDICATOR 6 (GLOVE)
GLOVE BIOGEL PI INDICATOR 6.5 (GLOVE) ×2
GLOVE BIOGEL PI INDICATOR 7.0 (GLOVE)
GLOVE BIOGEL PI INDICATOR 7.5 (GLOVE) ×1
GLOVE EUDERMIC 7 POWDERFREE (GLOVE) ×4 IMPLANT
GLOVE ORTHO TXT STRL SZ7.5 (GLOVE) IMPLANT
GLOVE SURG SS PI 6.0 STRL IVOR (GLOVE) ×1 IMPLANT
GOWN PREVENTION PLUS XLARGE (GOWN DISPOSABLE) ×2 IMPLANT
GOWN STRL NON-REIN LRG LVL3 (GOWN DISPOSABLE) ×12 IMPLANT
HEMOSTAT POWDER SURGIFOAM 1G (HEMOSTASIS) ×6 IMPLANT
HEMOSTAT SURGICEL 2X14 (HEMOSTASIS) ×2 IMPLANT
INSERT FOGARTY 61MM (MISCELLANEOUS) IMPLANT
INSERT FOGARTY XLG (MISCELLANEOUS) ×1 IMPLANT
KIT BASIN OR (CUSTOM PROCEDURE TRAY) ×2 IMPLANT
KIT CATH CPB BARTLE (MISCELLANEOUS) ×2 IMPLANT
KIT ROOM TURNOVER OR (KITS) ×2 IMPLANT
KIT SUCTION CATH 14FR (SUCTIONS) ×2 IMPLANT
KIT VASOVIEW W/TROCAR VH 2000 (KITS) ×2 IMPLANT
NS IRRIG 1000ML POUR BTL (IV SOLUTION) ×10 IMPLANT
PACK OPEN HEART (CUSTOM PROCEDURE TRAY) ×2 IMPLANT
PAD ARMBOARD 7.5X6 YLW CONV (MISCELLANEOUS) ×4 IMPLANT
PAD ELECT DEFIB RADIOL ZOLL (MISCELLANEOUS) ×2 IMPLANT
PENCIL BUTTON HOLSTER BLD 10FT (ELECTRODE) ×2 IMPLANT
PUNCH AORTIC ROTATE 4.0MM (MISCELLANEOUS) ×1 IMPLANT
PUNCH AORTIC ROTATE 4.5MM 8IN (MISCELLANEOUS) ×2 IMPLANT
PUNCH AORTIC ROTATE 5MM 8IN (MISCELLANEOUS) IMPLANT
SEALANT SURG COSEAL 4ML (VASCULAR PRODUCTS) ×1 IMPLANT
SENSOR MYOCARDIAL TEMP (MISCELLANEOUS) ×1 IMPLANT
SET CARDIOPLEGIA MPS 5001102 (MISCELLANEOUS) ×1 IMPLANT
SOLUTION ANTI FOG 6CC (MISCELLANEOUS) ×1 IMPLANT
SPONGE GAUZE 4X4 12PLY (GAUZE/BANDAGES/DRESSINGS) ×5 IMPLANT
SPONGE INTESTINAL PEANUT (DISPOSABLE) IMPLANT
SPONGE LAP 18X18 X RAY DECT (DISPOSABLE) ×1 IMPLANT
SPONGE LAP 4X18 X RAY DECT (DISPOSABLE) ×2 IMPLANT
SUT BONE WAX W31G (SUTURE) ×2 IMPLANT
SUT ETHILON 3 0 PS 1 (SUTURE) ×1 IMPLANT
SUT MNCRL AB 4-0 PS2 18 (SUTURE) ×2 IMPLANT
SUT PROLENE 3 0 SH DA (SUTURE) IMPLANT
SUT PROLENE 3 0 SH1 36 (SUTURE) ×2 IMPLANT
SUT PROLENE 4 0 RB 1 (SUTURE) ×6
SUT PROLENE 4 0 SH DA (SUTURE) IMPLANT
SUT PROLENE 4-0 RB1 .5 CRCL 36 (SUTURE) IMPLANT
SUT PROLENE 4-0 RB1 18X2 ARM (SUTURE) IMPLANT
SUT PROLENE 5 0 C 1 36 (SUTURE) IMPLANT
SUT PROLENE 6 0 C 1 30 (SUTURE) IMPLANT
SUT PROLENE 7 0 BV 1 (SUTURE) IMPLANT
SUT PROLENE 7 0 BV1 MDA (SUTURE) ×4 IMPLANT
SUT PROLENE 8 0 BV175 6 (SUTURE) ×1 IMPLANT
SUT SILK  1 MH (SUTURE)
SUT SILK 1 MH (SUTURE) IMPLANT
SUT STEEL STERNAL CCS#1 18IN (SUTURE) IMPLANT
SUT STEEL SZ 6 DBL 3X14 BALL (SUTURE) ×3 IMPLANT
SUT VIC AB 1 CTX 36 (SUTURE) ×8
SUT VIC AB 1 CTX36XBRD ANBCTR (SUTURE) ×2 IMPLANT
SUT VIC AB 2-0 CT1 27 (SUTURE) ×2
SUT VIC AB 2-0 CT1 36 (SUTURE) ×1 IMPLANT
SUT VIC AB 2-0 CT1 TAPERPNT 27 (SUTURE) IMPLANT
SUT VIC AB 2-0 CTX 27 (SUTURE) IMPLANT
SUT VIC AB 3-0 SH 27 (SUTURE)
SUT VIC AB 3-0 SH 27X BRD (SUTURE) IMPLANT
SUT VIC AB 3-0 X1 27 (SUTURE) IMPLANT
SUT VICRYL 4-0 PS2 18IN ABS (SUTURE) IMPLANT
SUTURE E-PAK OPEN HEART (SUTURE) ×2 IMPLANT
SYSTEM SAHARA CHEST DRAIN ATS (WOUND CARE) ×2 IMPLANT
TAPE CLOTH SURG 4X10 WHT LF (GAUZE/BANDAGES/DRESSINGS) ×1 IMPLANT
TAPE PAPER 3X10 WHT MICROPORE (GAUZE/BANDAGES/DRESSINGS) ×1 IMPLANT
TOWEL OR 17X24 6PK STRL BLUE (TOWEL DISPOSABLE) ×2 IMPLANT
TOWEL OR 17X26 10 PK STRL BLUE (TOWEL DISPOSABLE) ×2 IMPLANT
TRAY FOLEY IC TEMP SENS 14FR (CATHETERS) ×2 IMPLANT
TUBE SUCT INTRACARD DLP 20F (MISCELLANEOUS) ×2 IMPLANT
TUBING INSUFFLATION 10FT LAP (TUBING) ×2 IMPLANT
UNDERPAD 30X30 INCONTINENT (UNDERPADS AND DIAPERS) ×2 IMPLANT
WATER STERILE IRR 1000ML POUR (IV SOLUTION) ×4 IMPLANT

## 2013-05-13 NOTE — Procedures (Signed)
Name: [name] MRN: [id number] DOB: 27-Mar-1934   PROCEDURE NOTE  Procedure:  Endotracheal intubation.  Indication:  Acute respiratory failure  Consent:  Consent was implied due to the emergency nature of the procedure.  Anesthesia:  A total of 10 mg of Etomidate was given intravenously.  Procedure summary:  Appropriate equipment was assembled. The patient was identified as [name] and safety timeout was performed. The patient was placed supine, with head in sniffing position. After adequate level of anesthesia was achieved, a GS3 blade was inserted into the oropharynx and the vocal cords were visualized. A 7.5 endotracheal tube was inserted without difficulty and visualized going through the vocal cords. The stylette was removed and cuff inflated. Colorimetric change was noted on the CO2 meter. Breath sounds were heard over both lung fields equally. ETT was secured at 25 lip line.  Post procedure chest xray was ordered.  Complications:  No immediate complications were noted.  Hemodynamic parameters and oxygenation remained stable throughout the procedure.    Orlean Bradford, M.D. Pulmonary and Critical Care Medicine [facility] Pager: [phone]  05/13/2013, 5:19 AM

## 2013-05-13 NOTE — Progress Notes (Signed)
  Echocardiogram 2D Echocardiogram has been performed.  Jorje Guild 05/13/2013, 2:32 PM

## 2013-05-13 NOTE — Consult Note (Signed)
[address]       [name] [address]             [phone]        Reason for Consult: High grade left main and multi-vessel coronary disease, S/P acute non-STEMI with pulmonary edema and cardiogenic shock. Referring Physician: Dr. Casimiro Needle is an 79 y.o. male.  HPI:   Patient has no prior cardiac history but presented to [facility] yesterday am with a week of intermittent chest pain culminating in a 10/10 episode early yesterday morning. He ruled in for non-STEMI with troponin of 6. He was found to be in A-flutter with variable conduction. His pain improved with NTG and morphine but did not resolve and he was transferred to [facility] for further care. 2D echo showed EF of 45% with no valvular abnormalities. He was apparently stable overnight last night and early this am developed sudden pulmonary edema requiring intubation. He was taken to the cath lab and was in cardiogenic shock. Cath showed 90% distal LM and severe 3 vessel coronary disease with RCA occlusion. IABP was not inserted due to a 5 cm AAA. He was started on levophed and taken back to the CCU in hemodynamically stable condition with SBP 120. CXR now continues to show some pulmonary edema. His ABG on 100% shows a PO2 of 60 and PCO2 60. He has a hx of chronic kidney disease with creat 1.77 this am. He was also on coumadin for a hx of left leg DVT and atrial flutter. INR was 3.3 when admitted and he was given FFP and Vit K. Troponin this am was 8.9. CT scan at [facility] also showed a 8mm exophytic left kidney mass.  Past Medical History  Diagnosis Date  . Cancer   . Hypertension   . Collagen vascular disease   . Anginal pain   . Depression   . Arthritis     Past Surgical History  Procedure Laterality Date  . Eye surgery      History reviewed. No pertinent family history.  Social History:  reports that he has never smoked. He does not have any smokeless tobacco history on file.  He reports that he does not drink alcohol or use illicit drugs.  Allergies: No Known Allergies  Medications:  I have reviewed the patient's current medications. Prior to Admission:  Prescriptions prior to admission  Medication Sig Dispense Refill  . aspirin EC 81 MG tablet Take 81 mg by mouth daily.      [name] diltiazem (TIAZAC) 360 MG 24 hr capsule Take 360 mg by mouth daily.      . simvastatin (ZOCOR) 40 MG tablet Take 40 mg by mouth every evening.      . warfarin (COUMADIN) 5 MG tablet Take 5 mg by mouth daily.       Scheduled: . aminocaproic acid (AMICAR) for OHS   Intravenous To OR  . antiseptic oral rinse  1 application Mouth Rinse QID  . aspirin EC  81 mg Oral Daily  . atorvastatin  80 mg Oral q1800  . cefUROXime (ZINACEF) 1.5 GM IVPB  1.5 g Intravenous To OR  . cefUROXime (ZINACEF)  IV  750 mg Intravenous To OR  . chlorhexidine  15 mL Mouth/Throat BID  . dexmedetomidine  0.1-0.7 mcg/kg/hr Intravenous To OR  . DOPamine  2-20 mcg/kg/min Intravenous To OR  . epinephrine  0.5-20 mcg/min Intravenous To OR  . furosemide  80 mg Intravenous  Q12H  . heparin-papaverine-plasmalyte irrigation   Irrigation To OR  . heparin 30,000 units/NS 1000 mL solution for CELLSAVER   Other To OR  . insulin aspart  0-15 Units Subcutaneous Q4H  . insulin (NOVOLIN-R) infusion   Intravenous To OR  . lidocaine (cardiac) 100 mg/70ml      . magnesium sulfate  40 mEq Other To OR  . nitroGLYCERIN  2-200 mcg/min Intravenous To OR  . pantoprazole (PROTONIX) IV  40 mg Intravenous Q24H  . phenylephrine (NEO-SYNEPHRINE) Adult infusion  30-200 mcg/min Intravenous To OR  . piperacillin-tazobactam  3.375 g Intravenous Once  . piperacillin-tazobactam (ZOSYN)  IV  3.375 g Intravenous Q8H  . [START ON 05/14/2013] potassium chloride  80 mEq Other To OR  . rocuronium      . sodium chloride  3 mL Intravenous Q12H  . sodium chloride  3 mL Intravenous Q12H  . vancomycin  1,500 mg Intravenous Q24H   Continuous: . sodium  chloride 75 mL/hr at 05/13/13 1210  . fentaNYL infusion INTRAVENOUS 100 mcg/hr (05/13/13 1015)  . midazolam (VERSED) infusion 3 mg/hr (05/13/13 1043)  . norepinephrine (LEVOPHED) Adult infusion 15 mcg/min (05/13/13 1152)   UJW:[id number], midazolam, ondansetron (ZOFRAN) IV, sodium chloride, sodium chloride Anti-infectives   Start     Dose/Rate Route Frequency Ordered Stop   05/14/13 0600  vancomycin (VANCOCIN) 1,500 mg in sodium chloride 0.9 % 500 mL IVPB  Status:  Discontinued     1,500 mg 250 mL/hr over 120 Minutes Intravenous Every 24 hours 05/13/13 0521 05/13/13 1128   05/14/13 0400  cefUROXime (ZINACEF) 750 mg in dextrose 5 % 50 mL IVPB  Status:  Discontinued     750 mg 100 mL/hr over 30 Minutes Intravenous To Surgery 05/13/13 1111 05/13/13 1124   05/13/13 1200  piperacillin-tazobactam (ZOSYN) IVPB 3.375 g     3.375 g 12.5 mL/hr over 240 Minutes Intravenous Every 8 hours 05/13/13 0521     05/13/13 1145  cefUROXime (ZINACEF) 1.5 g in dextrose 5 % 50 mL IVPB     1.5 g 100 mL/hr over 30 Minutes Intravenous To Surgery 05/13/13 1130 05/14/13 1145   05/13/13 1130  cefUROXime (ZINACEF) 750 mg in dextrose 5 % 50 mL IVPB     750 mg 100 mL/hr over 30 Minutes Intravenous To Surgery 05/13/13 1124 05/14/13 1130   05/13/13 1125  vancomycin (VANCOCIN) 1,500 mg in sodium chloride 0.9 % 500 mL IVPB     1,500 mg 250 mL/hr over 120 Minutes Intravenous Every 24 hours 05/13/13 1128     05/13/13 0530  vancomycin (VANCOCIN) 2,000 mg in sodium chloride 0.9 % 500 mL IVPB     2,000 mg 250 mL/hr over 120 Minutes Intravenous  Once 05/13/13 0521 05/13/13 0800   05/13/13 0530  piperacillin-tazobactam (ZOSYN) IVPB 3.375 g     3.375 g 100 mL/hr over 30 Minutes Intravenous  Once 05/13/13 3086        Results for orders placed during the hospital encounter of 05/12/13 (from the past 48 hour(s))  MRSA PCR SCREENING     Status: None   Collection Time    05/12/13  3:30 PM      Result Value Range   MRSA by  PCR NEGATIVE  NEGATIVE   Comment:            The GeneXpert MRSA Assay (FDA     approved for NASAL specimens     only), is one component of a     comprehensive MRSA colonization  surveillance program. It is not     intended to diagnose MRSA     infection nor to guide or     monitor treatment for     MRSA infections.  TYPE AND SCREEN     Status: None   Collection Time    05/12/13  5:40 PM      Result Value Range   ABO/RH(D) O POS     Antibody Screen NEG     Sample Expiration 05/15/2013    ABO/RH     Status: None   Collection Time    05/12/13  5:40 PM      Result Value Range   ABO/RH(D) O POS    TSH     Status: Abnormal   Collection Time    05/12/13  5:44 PM      Result Value Range   TSH 10.857 (*) 0.350 - 4.500 uIU/mL  PREPARE FRESH FROZEN PLASMA     Status: None   Collection Time    05/12/13  5:44 PM      Result Value Range   Unit Number [id number]     Blood Component Type THAWED PLASMA     Unit division 00     Status of Unit ISSUED,FINAL     Transfusion Status OK TO TRANSFUSE     Unit Number [id number]     Blood Component Type THAWED PLASMA     Unit division 00     Status of Unit ISSUED,FINAL     Transfusion Status OK TO TRANSFUSE    TROPONIN I     Status: Abnormal   Collection Time    05/12/13  5:48 PM      Result Value Range   Troponin I 6.21 (*) <0.30 ng/mL   Comment:            Due to the release kinetics of cTnI,     a negative result within the first hours     of the onset of symptoms does not rule out     myocardial infarction with certainty.     If myocardial infarction is still suspected,     repeat the test at appropriate intervals.     CRITICAL RESULT CALLED TO, READ BACK BY AND VERIFIED WITH:     RN SHELTON, T. 05/12/13 1849 Sallyanne Kuster, M.  PROTIME-INR     Status: Abnormal   Collection Time    05/12/13 10:27 PM      Result Value Range   Prothrombin Time 27.9 (*) 11.6 - 15.2 seconds   INR 2.72 (*) 0.00 - 1.49  TROPONIN I     Status: Abnormal     Collection Time    05/13/13  1:41 AM      Result Value Range   Troponin I 8.93 (*) <0.30 ng/mL   Comment:            Due to the release kinetics of cTnI,     a negative result within the first hours     of the onset of symptoms does not rule out     myocardial infarction with certainty.     If myocardial infarction is still suspected,     repeat the test at appropriate intervals.     CRITICAL VALUE NOTED.  VALUE IS CONSISTENT WITH PREVIOUSLY REPORTED AND CALLED VALUE.  POCT I-STAT 3, BLOOD GAS (G3+)     Status: Abnormal   Collection Time    05/13/13  3:50 AM  Result Value Range   pH, Arterial 7.287 (*) 7.350 - 7.450   pCO2 arterial 50.1 (*) 35.0 - 45.0 mmHg   pO2, Arterial 39.0 (*) 80.0 - 100.0 mmHg   Bicarbonate 23.9  20.0 - 24.0 mEq/L   TCO2 25  0 - 100 mmol/L   O2 Saturation 66.0     Acid-base deficit 3.0 (*) 0.0 - 2.0 mmol/L   Patient temperature 98.6 F     Collection site RADIAL, ALLEN'S TEST ACCEPTABLE     Drawn by RT     Sample type ARTERIAL     Comment NOTIFIED PHYSICIAN    BASIC METABOLIC PANEL     Status: Abnormal   Collection Time    05/13/13  4:09 AM      Result Value Range   Sodium 139  135 - 145 mEq/L   Potassium 4.7  3.5 - 5.1 mEq/L   Chloride 104  96 - 112 mEq/L   CO2 24  19 - 32 mEq/L   Glucose, Bld 148 (*) 70 - 99 mg/dL   BUN 42 (*) 6 - 23 mg/dL   Creatinine, Ser 1.61 (*) 0.50 - 1.35 mg/dL   Calcium 9.2  8.4 - 09.6 mg/dL   GFR calc non Af Amer 35 (*) >90 mL/min   GFR calc Af Amer 41 (*) >90 mL/min   Comment:            The eGFR has been calculated     using the CKD EPI equation.     This calculation has not been     validated in all clinical     situations.     eGFR's persistently     <90 mL/min signify     possible Chronic Kidney Disease.  PROTIME-INR     Status: Abnormal   Collection Time    05/13/13  4:09 AM      Result Value Range   Prothrombin Time 19.7 (*) 11.6 - 15.2 seconds   INR 1.72 (*) 0.00 - 1.49  LIPID PANEL     Status:  None   Collection Time    05/13/13  4:09 AM      Result Value Range   Cholesterol 172  0 - 200 mg/dL   Triglycerides 045  <409 mg/dL   HDL 47  >81 mg/dL   Total CHOL/HDL Ratio 3.7     VLDL 28  0 - 40 mg/dL   LDL Cholesterol 97  0 - 99 mg/dL   Comment:            Total Cholesterol/HDL:CHD Risk     Coronary Heart Disease Risk Table                         Men   Women      1/2 Average Risk   3.4   3.3      Average Risk       5.0   4.4      2 X Average Risk   9.6   7.1      3 X Average Risk  23.4   11.0                Use the calculated Patient Ratio     above and the CHD Risk Table     to determine the patient's CHD Risk.                ATP III CLASSIFICATION (LDL):      <  100     mg/dL   Optimal      161-096  mg/dL   Near or Above                        Optimal      130-159  mg/dL   Borderline      045-409  mg/dL   High      >811     mg/dL   Very High  MAGNESIUM     Status: Abnormal   Collection Time    05/13/13  4:09 AM      Result Value Range   Magnesium 2.7 (*) 1.5 - 2.5 mg/dL  PROCALCITONIN     Status: None   Collection Time    05/13/13  4:09 AM      Result Value Range   Procalcitonin 0.10     Comment:            Interpretation:     PCT (Procalcitonin) <= 0.5 ng/mL:     Systemic infection (sepsis) is not likely.     Local bacterial infection is possible.     (NOTE)             ICU PCT Algorithm               Non ICU PCT Algorithm        ----------------------------     ------------------------------             PCT < 0.25 ng/mL                 PCT < 0.1 ng/mL         Stopping of antibiotics            Stopping of antibiotics           strongly encouraged.               strongly encouraged.        ----------------------------     ------------------------------           PCT level decrease by               PCT < 0.25 ng/mL           >= 80% from peak PCT           OR PCT 0.25 - 0.5 ng/mL          Stopping of antibiotics                                                  encouraged.         Stopping of antibiotics               encouraged.        ----------------------------     ------------------------------           PCT level decrease by              PCT >= 0.25 ng/mL           < 80% from peak PCT            AND PCT >= 0.5 ng/mL            Continuing antibiotics  encouraged.           Continuing antibiotics                encouraged.        ----------------------------     ------------------------------         PCT level increase compared          PCT > 0.5 ng/mL             with peak PCT AND              PCT >= 0.5 ng/mL             Escalation of antibiotics                                              strongly encouraged.          Escalation of antibiotics            strongly encouraged.  GLUCOSE, CAPILLARY     Status: Abnormal   Collection Time    05/13/13  5:41 AM      Result Value Range   Glucose-Capillary 128 (*) 70 - 99 mg/dL  POCT I-STAT 3, BLOOD GAS (G3+)     Status: Abnormal   Collection Time    05/13/13  6:26 AM      Result Value Range   pH, Arterial 7.245 (*) 7.350 - 7.450   pCO2 arterial 60.1 (*) 35.0 - 45.0 mmHg   pO2, Arterial 91.0  80.0 - 100.0 mmHg   Bicarbonate 26.0 (*) 20.0 - 24.0 mEq/L   TCO2 28  0 - 100 mmol/L   O2 Saturation 95.0     Acid-base deficit 2.0  0.0 - 2.0 mmol/L   Patient temperature 98.6 F     Collection site BRACHIAL ARTERY     Drawn by RT     Sample type ARTERIAL     Comment NOTIFIED PHYSICIAN    GLUCOSE, CAPILLARY     Status: None   Collection Time    05/13/13  7:34 AM      Result Value Range   Glucose-Capillary 85  70 - 99 mg/dL  POCT I-STAT 3, BLOOD GAS (G3+)     Status: Abnormal   Collection Time    05/13/13 10:56 AM      Result Value Range   pH, Arterial 7.210 (*) 7.350 - 7.450   pCO2 arterial 62.0 (*) 35.0 - 45.0 mmHg   pO2, Arterial 62.0 (*) 80.0 - 100.0 mmHg   Bicarbonate 24.7 (*) 20.0 - 24.0 mEq/L   TCO2 27  0 - 100 mmol/L   O2  Saturation 85.0     Acid-base deficit 4.0 (*) 0.0 - 2.0 mmol/L   Patient temperature 99.0 F     Collection site RADIAL, ALLEN'S TEST ACCEPTABLE     Drawn by Operator     Sample type ARTERIAL     Comment NOTIFIED PHYSICIAN      Dg Chest Port 1 View  05/13/2013   *RADIOLOGY REPORT*  Clinical Data: Acute pulmonary edema, follow-up  PORTABLE CHEST - 1 VIEW  Comparison: Portable chest x-ray of 05/13/2013  Findings: The airspace disease is predominately throughout the right mid and lower lung field.  Although this could represent residual asymmetrical edema, pneumonia is also a consideration in view of the asymmetry.  The endotracheal tip  is approximately 6.1 cm above the carina.  Cardiomegaly is stable.  IMPRESSION: Airspace disease throughout the right mid and lower lung field may represent residual asymmetrical edema, but pneumonia is a definite consideration.   Original Report Authenticated By: Dwyane Dee, M.D.   Dg Chest Portable 1 View  05/13/2013   *RADIOLOGY REPORT*  Clinical Data: Post intubation.  PORTABLE CHEST - 1 VIEW  Comparison: 05/13/2013 at 0451 hours  Findings: Interval placement endotracheal tube with tip about 4.8 cm above the carina.  Enteric tube has been placed.  The tip is off the field of view but is below the left hemidiaphragm.  Mild cardiac enlargement.  Persistent airspace disease in both lungs, demonstrating mild increase on the left.  Probable small left pleural effusion.  IMPRESSION: Appliances appear to be in satisfactory position.  Persistent bilateral airspace infiltrates in the lungs, increasing on the left.   Original Report Authenticated By: Burman Nieves, M.D.   Dg Chest Port 1 View  05/13/2013   *RADIOLOGY REPORT*  Clinical Data: Shortness of breath and chest pain increasing tonight.  PORTABLE CHEST - 1 VIEW  Comparison: 05/12/2013  Findings: Cardiac enlargement with mild pulmonary vascular congestion.  Since the previous study, since the previous study, there is  interval development of airspace disease throughout the right lung and in the left lung base.  Changes suggest pneumonia. Aspiration or asymmetric edema can also have this appearance.  No blunting of costophrenic angles.  No pneumothorax.  Calcification of the aorta.  IMPRESSION: Developing airspace disease throughout the right lung and in the left lung base since previous study.  Persistent cardiac enlargement with mild pulmonary vascular congestion.   Original Report Authenticated By: Burman Nieves, M.D.    Review of Systems  Unable to perform ROS: intubated   Blood pressure 96/53, pulse 114, temperature 99 F (37.2 C), temperature source Oral, resp. rate 22, height 6\' 1"  (1.854 m), weight 110.5 kg (243 lb 9.7 oz), SpO2 96.00%. Physical Exam  Constitutional: He appears well-developed and well-nourished.  HENT:  Head: Normocephalic and atraumatic.  Neck: JVD present. No thyromegaly present.  Cardiovascular: Normal rate, regular rhythm and normal heart sounds.   No murmur heard. Respiratory: He has rales.  bilaterally  GI: Soft. He exhibits no distension and no mass.  Musculoskeletal: He exhibits no edema.  Left leg wrapped in wound dressing.  Lymphadenopathy:    He has no cervical adenopathy.  Neurological:  Unable to assess due to sedation but reportedly intact this am.  Skin: Skin is warm and dry.     [facility]*                *[facility]*                      [address]                     [place], [place] [address]                         [phone]   ------------------------------------------------------------ Transthoracic Echocardiography  Patient:    Wash, Nienhaus MR #:       [id number] Study Date: 05/12/2013 Gender:     M Age:        14 Height:     185.4cm Weight:     113.6kg BSA:        2.61m^2 Pt. Status: Room:  2908    ADMITTING    Nahser, Philip  ATTENDING    Nahser, Wynona Luna, Hospital  SONOGRAPHER   Cathie Beams  Reather Converse, Eugene cc:  ------------------------------------------------------------ LV EF: 45% -   50%  ------------------------------------------------------------ Indications:   Previous study 03/02/2011.   MI - acute 410.91.  ------------------------------------------------------------ History:   PMH:  Chest pain. Recurrent atrial flutter. History of left leg DVT. Chronic kidney disease. Obesity. History of prostate cancer.  Risk factors:  Hypertension.   ------------------------------------------------------------ Study Conclusions  - Left ventricle: The cavity size was mildly dilated. Wall   thickness was increased in a pattern of mild LVH. Systolic   function was mildly reduced. The estimated ejection   fraction was in the range of 45% to 50%. There is   hypokinesis of the basal inferior posterior myocardium.   Features are consistent with a pseudonormal left   ventricular filling pattern, with concomitant abnormal   relaxation and increased filling pressure (grade 2   diastolic dysfunction). - Mitral valve: Calcified annulus. - Left atrium: The atrium was mildly dilated. Transthoracic echocardiography.  M-mode, complete 2D, spectral Doppler, and color Doppler.  Height:  Height: 185.4cm. Height: 73in.  Weight:  Weight: 113.6kg. Weight: 250lb.  Body mass index:  BMI: 33.1kg/m^2.  Body surface area:    BSA: 2.58m^2.  Blood pressure:     139/61.  Patient status:  Inpatient.  Location:  Bedside.  ------------------------------------------------------------  ------------------------------------------------------------ Left ventricle:  The cavity size was mildly dilated. Wall thickness was increased in a pattern of mild LVH. Systolic function was mildly reduced. The estimated ejection fraction was in the range of 45% to 50%.  Regional wall motion abnormalities:  There is hypokinesis of the basal inferior posterior myocardium. Features are  consistent with a pseudonormal left ventricular filling pattern, with concomitant abnormal relaxation and increased filling pressure (grade 2 diastolic dysfunction).  ------------------------------------------------------------ Aortic valve:   Trileaflet; mildly calcified leaflets. Mobility was not restricted.  Doppler:  Transvalvular velocity was within the normal range. There was no stenosis.  No regurgitation.  ------------------------------------------------------------ Aorta:  Aortic root: The aortic root was normal in size.  ------------------------------------------------------------ Mitral valve:   Calcified annulus. Mobility was not restricted.  Doppler:  Transvalvular velocity was within the normal range. There was no evidence for stenosis.  No regurgitation.    Peak gradient: 6mm Hg (D).  ------------------------------------------------------------ Left atrium:  The atrium was mildly dilated.  ------------------------------------------------------------ Right ventricle:  The cavity size was normal. Systolic function was normal.  ------------------------------------------------------------ Pulmonic valve:    Doppler:  Transvalvular velocity was within the normal range. There was no evidence for stenosis.  Trivial regurgitation.  ------------------------------------------------------------ Tricuspid valve:   Structurally normal valve.    Doppler: Transvalvular velocity was within the normal range.  No regurgitation.  ------------------------------------------------------------ Right atrium:  The atrium was normal in size.  ------------------------------------------------------------ Pericardium:  There was no pericardial effusion.  ------------------------------------------------------------ Systemic veins: Inferior vena cava: The vessel was normal in size.  ------------------------------------------------------------  2D measurements        Normal  Doppler  measurements   Normal Left ventricle                 Mitral valve LVID ED,   51.7 mm     43-52   Peak E vel    118 cm/s ------ chord,  Peak A vel    52. cm/s ------ PLAX                                           5 LVID ES,     41 mm     23-38   Deceleration  158 ms   150-23 chord,                         time                   0 PLAX                           Peak            6 mm   ------ FS,          21 %      >29     gradient, D       Hg chord,                         Peak E/A      2.2      ------ PLAX                           ratio LVPW, ED  12.34 mm     ------  Pulmonic valve IVS/LVPW   1.09        <1.3    Regurg vel,   129 cm/s ------ ratio, ED                      ED Ventricular septum IVS, ED    13.4 mm     ------ Aorta Root         35 mm     ------ diam, ED Left atrium AP dim       46 mm     ------ AP dim     1.88 cm/m^2 <2.2 index   ------------------------------------------------------------ Prepared and Electronically Authenticated by  Olga Millers 2014-07-08T16:40:[id number]   CARDIAC CATH:  Of note, intra-aortic balloon pump not placed secondary to known AAA which is reported as nearly 5.0 x 5.0 cm.   Hemodynamic Findings: Ao:   100/63           LV: 99/15/20 RA: 11          RV: 31/8/11 PA:  34/19 (mean 23)   PCWP: 17 Fick Cardiac Output: 3.1 L/min Fick Cardiac Index: 1.32 L/min/m2 Central Aortic Saturation: 92% Pulmonary Artery Saturation: 36%  Angiographic Findings:  Left main: 90% distal stenosis.   Left Anterior Descending Artery: Large caliber vessel that courses to the apex. Ostial 90% stenosis. Diffuse 30% proximal stenosis. The mid vessel has a 95% stenosis at the takeoff of a large septal perforator and moderate caliber diagonal branch. The distal vessel has a 70% stenosis.    Circumflex Artery: Large caliber vessel with intermediate branch and large caliber obtuse marginal branch. The intermediate branch is moderate to  large in caliber with ostial 80% stenosis. The Circumflex has ostial 90% stenosis. The first obtuse marginal branch is moderate in cailber with proximal 80% stenosis.    Right Coronary Artery: Dominant vessel with 100% proximal occlusion. The distal vessel  is seen to fill from left to right collaterals.   Left Ventricular Angiogram: Deferred.   Impression: 1. Severe triple vessel CAD with severe distal left main stenosis, chronically occluded RCA. Diffuse disease in LAD and Circumflex.   2. Cardiogenic shock-unable to place IABP secondary to known AAA  Recommendations: Stat CT surgery consult for multi-vessel CABG. Continue support with pressors. He is intubated. Unable to place IABP with AAA. If he cannot go to the OR today, we could bring him back for placement of Impella percutaneous LV assist device.           *RADIOLOGY REPORT*   Clinical Data: Acute pulmonary edema, follow-up   PORTABLE CHEST - 1 VIEW   Comparison: Portable chest x-ray of 05/13/2013   Findings: The airspace disease is predominately throughout the right mid and lower lung field.  Although this could represent residual asymmetrical edema, pneumonia is also a consideration in view of the asymmetry.  The endotracheal tip is approximately 6.1 cm above the carina.  Cardiomegaly is stable.   IMPRESSION: Airspace disease throughout the right mid and lower lung field may represent residual asymmetrical edema, but pneumonia is a definite consideration.     Original Report Authenticated By: Dwyane Dee, M.D.    Assessment/Plan:  He has 90% left main, occluded proximal RCA and significant high-grade proximal stenoses in the LAD and LCX. He had mild to moderate LV dysfuntion by echo yesterday and now has had flash pulmonary edema and cardiogenic shock due to ischemia this am. He has stablized but remains critically ill on levophed. His only hope is emergent CABG. His operative risk is high for ventricular failure,  respiratory failure with inability to adequately oxygenate and ventilate due to pulmonary edema and acute lung injury, renal failure requiring dialysis, death. I discussed the operative procedure with the patient's wife and sons including alternatives, benefits and risks; including but not limited to bleeding, blood transfusion, infection, stroke, myocardial infarction, graft failure, heart block requiring a permanent pacemaker, organ dysfunction, and death.  They understand and agree to proceed.    BARTLE,BRYAN K 05/13/2013, 12:04 PM

## 2013-05-13 NOTE — Anesthesia Preprocedure Evaluation (Signed)
Anesthesia Evaluation  Patient identified by MRN, date of birth, ID band Patient unresponsive    Reviewed: Allergy & Precautions, H&P , NPO status , Patient's Chart, lab work & pertinent test results  Airway       Dental   Pulmonary          Cardiovascular hypertension, + angina + CAD, + Past MI, + Peripheral Vascular Disease and +CHF     Neuro/Psych    GI/Hepatic   Endo/Other    Renal/GU      Musculoskeletal   Abdominal   Peds  Hematology   Anesthesia Other Findings   Reproductive/Obstetrics                           Anesthesia Physical Anesthesia Plan  ASA: IV and emergent  Anesthesia Plan: General   Post-op Pain Management:    Induction: Intravenous  Airway Management Planned: Oral ETT  Additional Equipment: Arterial line, CVP, PA Cath and TEE  Intra-op Plan:   Post-operative Plan: Post-operative intubation/ventilation  Informed Consent:   Plan Discussed with: CRNA, Anesthesiologist and Surgeon  Anesthesia Plan Comments:         Anesthesia Quick Evaluation

## 2013-05-13 NOTE — Progress Notes (Signed)
ANTIBIOTIC CONSULT NOTE - INITIAL  Pharmacy Consult for Vancocin and Zosyn Indication: rule out pneumonia  No Known Allergies  Patient Measurements: Height: 6\' 1"  (185.4 cm) Weight: 243 lb 9.7 oz (110.5 kg) IBW/kg (Calculated) : 79.9  Vital Signs: Temp: 97.4 F (36.3 C) (07/09 0056) Temp src: Oral (07/09 0056) BP: 106/43 mmHg (07/09 0515) Pulse Rate: 92 (07/09 0515) Intake/Output from previous day: 07/08 0701 - 07/09 0700 In: 2297.9 [P.O.:755; I.V.:883.7; Blood:609.2; IV Piggyback:50] Out: 775 [Urine:775] Intake/Output from this shift: Total I/O In: 1377 [P.O.:200; I.V.:567.8; Blood:609.2] Out: 525 [Urine:525]  Labs:  Recent Labs  05/13/13 0409  CREATININE 1.77*   Estimated Creatinine Clearance: 45.5 ml/min (by C-G formula based on Cr of 1.77).   Microbiology: Recent Results (from the past 720 hour(s))  MRSA PCR SCREENING     Status: None   Collection Time    05/12/13  3:30 PM      Result Value Range Status   MRSA by PCR NEGATIVE  NEGATIVE Final   Comment:            The GeneXpert MRSA Assay (FDA     approved for NASAL specimens     only), is one component of a     comprehensive MRSA colonization     surveillance program. It is not     intended to diagnose MRSA     infection nor to guide or     monitor treatment for     MRSA infections.    Medical History: Past Medical History  Diagnosis Date  . Cancer   . Hypertension   . Collagen vascular disease   . Anginal pain   . Depression   . Arthritis     Medications:  Prescriptions prior to admission  Medication Sig Dispense Refill  . aspirin EC 81 MG tablet Take 81 mg by mouth daily.      [name] diltiazem (TIAZAC) 360 MG 24 hr capsule Take 360 mg by mouth daily.      . simvastatin (ZOCOR) 40 MG tablet Take 40 mg by mouth every evening.      . warfarin (COUMADIN) 5 MG tablet Take 5 mg by mouth daily.       Scheduled:  . antiseptic oral rinse  1 application Mouth Rinse QID  . aspirin  324 mg Oral  Pre-Cath  . aspirin EC  81 mg Oral Daily  . atorvastatin  20 mg Oral q1800  . chlorhexidine  15 mL Mouth/Throat BID  . furosemide      . furosemide  80 mg Intravenous Q12H  . lidocaine (cardiac) 100 mg/26ml      . pantoprazole (PROTONIX) IV  40 mg Intravenous Q24H  . piperacillin-tazobactam  3.375 g Intravenous Once  . piperacillin-tazobactam (ZOSYN)  IV  3.375 g Intravenous Q8H  . rocuronium      . sodium chloride  3 mL Intravenous Q12H  . [START ON 05/14/2013] vancomycin  1,500 mg Intravenous Q24H  . vancomycin  2,000 mg Intravenous Once   Infusions:  . sodium chloride 50 mL/hr at 05/12/13 1415  . sodium chloride 50 mL/hr at 05/12/13 1935  . fentaNYL infusion INTRAVENOUS    . heparin      Assessment: 79yo male had been admitted for NSTEMI, awaiting cardiac cath when INR low enough, now in acute respiratory failure requiring intubation, to begin IV ABX.  Goal of Therapy:  Vancomycin trough level 15-20 mcg/ml  Plan:  Will give vancomycin 2000mg  IV x1 followed by 1500mg  IV  Q24H as well as Zosyn 3.375g IV Q8H and monitor CBC, Cx, levels prn.  Vernard Gambles, PharmD, BCPS  05/13/2013,5:22 AM

## 2013-05-13 NOTE — Progress Notes (Signed)
ANTICOAGULATION CONSULT NOTE - Follow Up Consult  Pharmacy Consult for heparin Indication: chest pain/ACS, DVT and Aflutter  Labs:  Recent Labs  05/12/13 1748 05/12/13 2227 05/13/13 0141 05/13/13 0409  LABPROT  --  27.9*  --  19.7*  INR  --  2.72*  --  1.72*  TROPONINI 6.21*  --  8.93*  --     Assessment: 79yo male awaiting cardiac cath when INR <1.6 but high risk given recent DVT, now to begin heparin now that INR <2 s/p vit K yesterday.  Goal of Therapy:  Heparin level 0.3-0.7 units/ml   Plan:  Will begin heparin gtt at 1500 units/hr and monitor heparin levels and CBC.  Vernard Gambles, PharmD, BCPS  05/13/2013,4:45 AM

## 2013-05-13 NOTE — Brief Op Note (Addendum)
05/13/2013  5:27 PM  PATIENT:  [name]  78 y.o. male  PRE-OPERATIVE DIAGNOSIS: NSTEMI with flash pulmonary edema and cardiogenic shock  POST-OPERATIVE DIAGNOSIS: same  PROCEDURE:  Procedure(s):  EMERGENT CORONARY ARTERY BYPASS GRAFTING  X 4 -LIMA to LAD -SEQUENTIAL SVG to OM1 and OM2 -SVG to PDA  ENDOSCOPIC SAPHENOUS VEIN HARVEST RIGHT AND LEFT LEG  SURGEON:  Surgeon(s) and Role:    * Alleen Borne, MD - Primary  PHYSICIAN ASSISTANT: Erin Barrett PA-C  ANESTHESIA:   general  EBL:  Total I/O In: 1363.5 [I.V.:1363.5] Out: 365 [Urine:365]  BLOOD ADMINISTERED: CELLSAVER  DRAINS: Left pleural and mediastinal chest tubes   LOCAL MEDICATIONS USED:  NONE  SPECIMEN:  No Specimen  DISPOSITION OF SPECIMEN:  N/A  COUNTS:  YES  TOURNIQUET:  * No tourniquets in log *  DICTATION: .Dragon Dictation  PLAN OF CARE: Admit to inpatient   PATIENT DISPOSITION:  ICU - intubated and hemodynamically stable.   Delay start of Pharmacological VTE agent (>24hrs) due to surgical blood loss or risk of bleeding: yes

## 2013-05-13 NOTE — Progress Notes (Signed)
Nutrition Brief Note  Malnutrition Screening Tool result is inaccurate.  Please consult if nutrition needs are identified.  Anyah Swallow, MS RD LDN Clinical Inpatient Dietitian Pager: [phone] Weekend/After hours pager: [phone]   

## 2013-05-13 NOTE — Progress Notes (Signed)
Patient c/o chest pain at 0300, morphine 4mg  iv administered, nitroglycerin iv increased to . Patient stated no relief from CP after meds, getting SOB with O2 sats in 80's, [place] changed to venturi mask at 50%. Still desating to low 80's and 70's, changed to non-rebreather mask. Cardiology paged, patient quickly getting worse, respiratory at bedside, e-link MD called and orders received. Attempted to place patient on bipap, but he was unable to tolerate the mask with frequent nausea (despite zofran). Cardiology at bedside at that time, CCM at bedside.  [name]

## 2013-05-13 NOTE — Anesthesia Postprocedure Evaluation (Signed)
  Anesthesia Post-op Note  Patient: [name]  Procedure(s) Performed: Procedure(s): CORONARY ARTERY BYPASS GRAFTING (CABG) (N/A)  Patient Location: ICU  Anesthesia Type:General  Level of Consciousness: unresponsive and Patient remains intubated per anesthesia plan  Airway and Oxygen Therapy: Patient remains intubated per anesthesia plan and Patient placed on Ventilator (see vital sign flow sheet for setting)  Post-op Pain: none  Post-op Assessment: Post-op Vital signs reviewed, Patient's Cardiovascular Status Stable and Respiratory Function Stable  Post-op Vital Signs: Reviewed and stable  Complications: No apparent anesthesia complications

## 2013-05-13 NOTE — Consult Note (Signed)
PULMONARY  / CRITICAL CARE MEDICINE  Name: [name] MRN: [id number] DOB: 1933-12-28    ADMISSION DATE:  05/12/2013 CONSULTATION DATE:  05/13/2013  REFERRING MD :  Cardiology PRIMARY SERVICE:  Cardiology  CHIEF COMPLAINT:  Acute respiratory failure  BRIEF PATIENT DESCRIPTION: 79 yo with past medical history of AF on Coumadin transferred from [facility] on 7/8 with NSTEMI.  Cardiac cath postponed secondary to coagulopathy / elevated Cr.  On 7/9 developed hypoxemic respiratory failure requiring intubation.  SIGNIFICANT EVENTS / STUDIES:  7/8  Transferred from [facility] with NSTEMI 7/8  TTE >>> EF=45-50, regional wall motion abnormalities, diastolic dysfunction 7/9  Intubated for hypoxemic respiratory failure  LINES / TUBES: OETT 7/9 >>> OGT 7/9 >>> Foley 7/9 >>>  CULTURES:  ANTIBIOTICS: Zosyn 7/9 >>> Vancomycin 7/9 >>>  The patient is encephalopathic and unable to provide history, which was obtained for available medical records.  HISTORY OF PRESENT ILLNESS:  79 yo with past medical history of AF on Coumadin transferred from [facility] on 7/8 with NSTEMI.  Cardiac cath postponed secondary to coagulopathy / elevated Cr.  On 7/9 developed hypoxemic respiratory failure requiring intubation.  PAST MEDICAL HISTORY :  Past Medical History  Diagnosis Date  . Cancer   . Hypertension   . Collagen vascular disease   . Anginal pain   . Depression   . Arthritis    Past Surgical History  Procedure Laterality Date  . Eye surgery     Prior to Admission medications   Medication Sig Start Date End Date Taking? Authorizing Provider  aspirin EC 81 MG tablet Take 81 mg by mouth daily.   Yes Historical Provider, MD  diltiazem (TIAZAC) 360 MG 24 hr capsule Take 360 mg by mouth daily.   Yes Historical Provider, MD  simvastatin (ZOCOR) 40 MG tablet Take 40 mg by mouth every evening.   Yes Historical Provider, MD  warfarin (COUMADIN) 5 MG tablet Take 5 mg by mouth  daily.   Yes Historical Provider, MD   No Known Allergies  FAMILY HISTORY:  History reviewed. No pertinent family history.  SOCIAL HISTORY:  reports that he has never smoked. He does not have any smokeless tobacco history on file. He reports that he does not drink alcohol or use illicit drugs.  REVIEW OF SYSTEMS:  Unable to provide.  INTERVAL HISTORY:  VITAL SIGNS: Temp:  [97.4 F (36.3 C)-98.3 F (36.8 C)] 97.4 F (36.3 C) (07/09 0056) Pulse Rate:  [25-119] 92 (07/09 0515) Resp:  [11-32] 16 (07/09 0515) BP: (79-160)/(40-96) 106/43 mmHg (07/09 0515) SpO2:  [71 %-99 %] 85 % (07/09 0515) FiO2 (%):  [100 %] 100 % (07/09 0440) Weight:  [110.5 kg (243 lb 9.7 oz)] 110.5 kg (243 lb 9.7 oz) (07/08 1411)  HEMODYNAMICS:   VENTILATOR SETTINGS: Vent Mode:  [-] PRVC FiO2 (%):  [100 %] 100 % Set Rate:  [16 bmp] 16 bmp Vt Set:  [630 mL] 630 mL PEEP:  [8 cmH20] 8 cmH20 Plateau Pressure:  [21 cmH20] 21 cmH20  INTAKE / OUTPUT: Intake/Output     07/08 0701 - 07/09 0700   P.O. 755   I.V. (mL/kg) 883.7 (8)   Blood 609.2   IV Piggyback 50   Total Intake(mL/kg) 2297.9 (20.8)   Urine (mL/kg/hr) 775   Total Output 775   Net +1522.9         PHYSICAL EXAMINATION: General:  Appears acutely ill, mechanically ventilated, synchronous Neuro:  Encephalopathic, nonfocal, cough / gag diminished HEENT:  PERRL, OETT / OGT Cardiovascular:  Irregular rapid heart, no murmurs Lungs:  Bilateral diminished air entry, bibasilar rales Abdomen:  Soft, distended, nontender, bowel sounds diminished Musculoskeletal:  Moves all extremities, bilateral pitting edema Skin:  LLE wound  LABS:  Recent Labs Lab 05/12/13 1748 05/12/13 2227 05/13/13 0141 05/13/13 0350 05/13/13 0409  NA  --   --   --   --  139  K  --   --   --   --  4.7  CL  --   --   --   --  104  CO2  --   --   --   --  24  GLUCOSE  --   --   --   --  148*  BUN  --   --   --   --  42*  CREATININE  --   --   --   --  1.77*  CALCIUM   --   --   --   --  9.2  INR  --  2.72*  --   --  1.72*  TROPONINI 6.21*  --  8.93*  --   --   PHART  --   --   --  7.287*  --   PCO2ART  --   --   --  50.1*  --   PO2ART  --   --   --  39.0*  --    No results found for this basename: GLUCAP,  in the last 168 hours  CXR:  7/9 >>> Hardware in good position, bilateral airspace disease R>L  ASSESSMENT / PLAN:  PULMONARY A:  Acute hypoxemic / hypercarbic respiratory failure.  Acute pulmonary edema vs aspiration pneumonia (asymmettric R sided airspace disease, distended abdomen, encephalopathy). P:   Gaol SpO2>92, pH>7.30 Full mechanical support Daily SBT Trend ABG / CXR  CARDIOVASCULAR A: NSTEMI.  AF/RVR, improved after intubated.  Acute on chronic systolic and diastolic CHF.  Hypotension. P:  Cardiology following, cath deferred  Goal MAP>60 Trend troponin ASA, Lipitor Hold beta-blocker and ACEI due to hypotension Hold Cardizem / Nitro gtt  Anticoagulation as below  RENAL A:  Chronic renal failure.  Fluid overload. P:   Goal K>4, Mg>2 Trend BMP Lasix 80 q12h x 48h Mg level  GASTROINTESTINAL A:  Nonspecific abdominal distension. P:   NPO as intubated OGT to Sx TF if remains intubated > 24 hours Protonix for GI Px  HEMATOLOGIC A:  Coumadin induced coagulopathy. P:  Trend CBC Restart Heparin gtt as INR<2 Trend APTT / INR  INFECTIOUS A:  Suspected aspiration pneumonia / HCAP. P:   Cultures and antibiotics as above PCT  ENDOCRINE  A:  Hyperglycemia.  No h/o DM.   P:   SSI  NEUROLOGIC A:  Acute encephalopathy. P:   Goal RASS 0 to -1 Fentanyl gtt Versed PRN  TODAY'S SUMMARY: NSTEMI - cath defered as Cr / INR elevated, conservative management, Cardiology following. VDRF - intubated.  Pulmonary edema - Lasix.  Suspected aspiration pneumonia - empirical abx.  I have personally obtained a history, examined the patient, evaluated laboratory and imaging results, formulated the assessment and plan and placed  orders.  CRITICAL CARE:  The patient is critically ill with multiple organ systems failure and requires high complexity decision making for assessment and support, frequent evaluation and titration of therapies, application of advanced monitoring technologies and extensive interpretation of multiple databases. Critical Care Time devoted to patient care services described in this note is  40 minutes.   Lonia Farber, MD Pulmonary and Critical Care Medicine [facility] Pager: [phone]  05/13/2013, 5:21 AM

## 2013-05-13 NOTE — Progress Notes (Signed)
eLink Physician-Brief Progress Note Patient Name: [name] DOB: 28-May-1934 MRN: [id number]  Date of Service  05/13/2013   HPI/Events of Note  Ongoing resp acidosis on vent   eICU Interventions  Plan: Increase RR on vent Vent orders entered Check ABG 1 hour after vent change   Intervention Category Major Interventions: Acid-Base disturbance - evaluation and management  Jezlyn Westerfield 05/13/2013, 6:37 AM

## 2013-05-13 NOTE — CV Procedure (Addendum)
Cardiac Catheterization Operative Report  [name] [id number] 7/9/20149:33 AM [name],[name] B., MD  Procedure Performed:  1. Left Heart Catheterization 2. Selective Coronary Angiography 3. Right Heart Catheterization 4. Left ventricular pressures  Operator: Verne Carrow, MD  Indication: 79 yo male with history of HTN, AAA, former tobacco abuse, atrial flutter on coumadin, DVT, PAD, CKD admitted from [facility] yesterday with NSTEMI. Pt decompensated overnight and was intubated secondary to respiratory distress. Urgent cardiac cath this am. INR 1.7 after FFP/Vit K. He has known renal dysfunction. Creatinine 1.7 this am (down from 2.1 yesterday).                       Procedure Details: The risks, benefits, complications, treatment options, and expected outcomes were discussed with the patient. The patient and/or family concurred with the proposed plan, giving informed consent. The patient was brought to the cath lab after IV hydration was begun and oral premedication was given. The patient was sedated on Versed and Fentanyl drips. The right groin was prepped and draped in the usual manner. Using the modified Seldinger access technique, a 5 French sheath was placed in the right femoral artery. A 6 French sheath was inserted into the right femoral vein. A balloon tipped catheter was used to perform a right heart catheterization. Standard diagnostic catheters were used to perform selective coronary angiography. A pigtail catheter was used to measure left ventricular pressures. An Angioseal was placed in the right femoral artery.  There were no immediate complications. The patient was taken to the recovery area in critical but stable condition.   Of note, intra-aortic balloon pump not placed secondary to known AAA which is reported as nearly 5.0 x 5.0 cm.   Hemodynamic Findings: Ao:   100/63           LV: 99/15/20 RA: 11          RV: 31/8/11 PA:  34/19 (mean 23)   PCWP: 17 Fick  Cardiac Output: 3.1 L/min Fick Cardiac Index: 1.32 L/min/m2 Central Aortic Saturation: 92% Pulmonary Artery Saturation: 36%  Angiographic Findings:  Left main: 90% distal stenosis.   Left Anterior Descending Artery: Large caliber vessel that courses to the apex. Ostial 90% stenosis. Diffuse 30% proximal stenosis. The mid vessel has a 95% stenosis at the takeoff of a large septal perforator and moderate caliber diagonal branch. The distal vessel has a 70% stenosis.   Circumflex Artery: Large caliber vessel with intermediate branch and large caliber obtuse marginal branch. The intermediate branch is moderate to large in caliber with ostial 80% stenosis. The Circumflex has ostial 90% stenosis. The first obtuse marginal branch is moderate in cailber with proximal 80% stenosis.   Right Coronary Artery: Dominant vessel with 100% proximal occlusion. The distal vessel is seen to fill from left to right collaterals.   Left Ventricular Angiogram: Deferred.   Impression: 1. Severe triple vessel CAD with severe distal left main stenosis, chronically occluded RCA. Diffuse disease in LAD and Circumflex.  2. Cardiogenic shock-unable to place IABP secondary to known AAA  Recommendations: Stat CT surgery consult for multi-vessel CABG. Continue support with pressors. He is intubated. Unable to place IABP with AAA. If he cannot go to the OR today, we could bring him back for placement of Impella percutaneous LV assist device.        Complications:  None; patient tolerated the procedure well.

## 2013-05-13 NOTE — Progress Notes (Signed)
 PROGRESS NOTE  Subjective:   Pt was transferred from [place] for UAP, + troponin, nausea.  He is also noted to have CKF and was fully anticoagulated.  Overnight, he decompensated - required intubation. Was seen by CMM.  Objective:    Vital Signs:   Temp:  [97.4 F (36.3 C)-99.3 F (37.4 C)] 99.3 F (37.4 C) (07/09 0732) Pulse Rate:  [25-119] 92 (07/09 0515) Resp:  [11-32] 24 (07/09 0732) BP: (78-160)/(40-96) 84/49 mmHg (07/09 0732) SpO2:  [71 %-99 %] 98 % (07/09 0732) FiO2 (%):  [100 %] 100 % (07/09 0732) Weight:  [243 lb 9.7 oz (110.5 kg)] 243 lb 9.7 oz (110.5 kg) (07/08 1411)  Last BM Date: 05/11/13   24-hour weight change: Weight change:   Weight trends: Filed Weights   05/12/13 1411  Weight: 243 lb 9.7 oz (110.5 kg)    Intake/Output:  07/08 0701 - 07/09 0700 In: 2967.7 [P.O.:755; I.V.:1053.5; Blood:609.2; IV Piggyback:550] Out: 1070 [Urine:1070]     Physical Exam: BP 84/49  Pulse 92  Temp(Src) 99.3 F (37.4 C) (Oral)  Resp 24  Ht 6' 1" (1.854 m)  Wt 243 lb 9.7 oz (110.5 kg)  BMI 32.15 kg/m2  SpO2 98%  General: Vital signs reviewed and noted. intubated  Head: Normocephalic, atraumatic., intubated  Eyes: conjunctivae/corneas clear.  EOM's intact.   Throat: normal  Neck:  thick  Lungs:    clear , on vent  Heart:  RR, tachy  Abdomen:  Soft, non-tender, non-distended    Extremities: No edema   Neurologic: Sedated, but arousable on vent  Psych: Normal     Labs: BMET:  Recent Labs  05/13/13 0409  NA 139  K 4.7  CL 104  CO2 24  GLUCOSE 148*  BUN 42*  CREATININE 1.77*  CALCIUM 9.2  MG 2.7*    Liver function tests: No results found for this basename: AST, ALT, ALKPHOS, BILITOT, PROT, ALBUMIN,  in the last 72 hours No results found for this basename: LIPASE, AMYLASE,  in the last 72 hours  CBC: No results found for this basename: WBC, NEUTROABS, HGB, HCT, MCV, PLT,  in the last 72 hours  Cardiac Enzymes:  Recent Labs   05/12/13 1748 05/13/13 0141  TROPONINI 6.21* 8.93*    Coagulation Studies:  Recent Labs  05/12/13 2227 05/13/13 0409  LABPROT 27.9* 19.7*  INR 2.72* 1.72*    Other: No components found with this basename: POCBNP,  No results found for this basename: DDIMER,  in the last 72 hours No results found for this basename: HGBA1C,  in the last 72 hours  Recent Labs  05/13/13 0409  CHOL 172  HDL 47  LDLCALC 97  TRIG 141  CHOLHDL 3.7    Recent Labs  05/12/13 1744  TSH 10.857*   No results found for this basename: VITAMINB12, FOLATE, FERRITIN, TIBC, IRON, RETICCTPCT,  in the last 72 hours   Other results:  ECG [employer] LBBB, ST depression  Medications:    Infusions: . sodium chloride 50 mL/hr at 05/12/13 1415  . sodium chloride 20 mL/hr at 05/13/13 0323  . fentaNYL infusion INTRAVENOUS 50 mcg/hr (05/13/13 0615)  . heparin 1,500 Units/hr (05/13/13 0559)    Scheduled Medications: . antiseptic oral rinse  1 application Mouth Rinse QID  . aspirin EC  81 mg Oral Daily  . atorvastatin  20 mg Oral q1800  . chlorhexidine  15 mL Mouth/Throat BID  . furosemide  80 mg Intravenous Q12H  . insulin aspart    0-15 Units Subcutaneous Q4H  . lidocaine (cardiac) 100 mg/5ml      . pantoprazole (PROTONIX) IV  40 mg Intravenous Q24H  . piperacillin-tazobactam  3.375 g Intravenous Once  . piperacillin-tazobactam (ZOSYN)  IV  3.375 g Intravenous Q8H  . rocuronium      . sodium chloride  3 mL Intravenous Q12H  . [START ON 05/14/2013] vancomycin  1,500 mg Intravenous Q24H  . vancomycin  2,000 mg Intravenous Once    Assessment/ Plan:      Acute respiratory failure: I suspect he is having profound ischemia which lead to his respiratory failure   diastolic CHF :  His systolic function is actually well preserved.    HCAP (healthcare-associated pneumonia)    NSTEMI (non-ST elevated myocardial infarction)- I suspect that he has a critical LM ( or equivalent).  He has deep ST  depression and now has had respiratory failure.   I think we need to proceed with cath.  He is likely to need CABG.  His renal function is still decreased but is about as good as we can get.    His INR is low enough to do the cath.  2. CKD : stage 3  Philip J. Nahser, Jr., MD, FACC 05/13/2013, 7:45 AM Office - [phone] Pager [phone]     Disposition:  Length of Stay: 1  Philip J. Nahser, Jr., MD, FACC 05/13/2013, 7:35 AM Office [phone] Pager [phone]    

## 2013-05-13 NOTE — Consult Note (Signed)
WOC consult requested for left leg.  Pt is already in cath lab this AM.  Family at bedside states he is followed by the outpatient wound care center and receives dressing changes Q week.  He was due to return for appointment and dressing change today.  Plan to return tomorrow and assess wound and change dressing. Cammie Mcgee MSN, RN, CWOCN, [place], CNS [phone]

## 2013-05-13 NOTE — H&P (View-Only) (Signed)
PROGRESS NOTE  Subjective:   Pt was transferred from [facility] for UAP, + troponin, nausea.  He is also noted to have CKF and was fully anticoagulated.  Overnight, he decompensated - required intubation. Was seen by [facility].  Objective:    Vital Signs:   Temp:  [97.4 F (36.3 C)-99.3 F (37.4 C)] 99.3 F (37.4 C) (07/09 0732) Pulse Rate:  [25-119] 92 (07/09 0515) Resp:  [11-32] 24 (07/09 0732) BP: (78-160)/(40-96) 84/49 mmHg (07/09 0732) SpO2:  [71 %-99 %] 98 % (07/09 0732) FiO2 (%):  [100 %] 100 % (07/09 0732) Weight:  [243 lb 9.7 oz (110.5 kg)] 243 lb 9.7 oz (110.5 kg) (07/08 1411)  Last BM Date: 05/11/13   24-hour weight change: Weight change:   Weight trends: Filed Weights   05/12/13 1411  Weight: 243 lb 9.7 oz (110.5 kg)    Intake/Output:  07/08 0701 - 07/09 0700 In: 2967.7 [P.O.:755; I.V.:1053.5; Blood:609.2; IV Piggyback:550] Out: 1070 [Urine:1070]     Physical Exam: BP 84/49  Pulse 92  Temp(Src) 99.3 F (37.4 C) (Oral)  Resp 24  Ht 6\' 1"  (1.854 m)  Wt 243 lb 9.7 oz (110.5 kg)  BMI 32.15 kg/m2  SpO2 98%  General: Vital signs reviewed and noted. intubated  Head: Normocephalic, atraumatic., intubated  Eyes: conjunctivae/corneas clear.  EOM's intact.   Throat: normal  Neck:  thick  Lungs:    clear , on vent  Heart:  RR, tachy  Abdomen:  Soft, non-tender, non-distended    Extremities: No edema   Neurologic: Sedated, but arousable on vent  Psych: Normal     Labs: BMET:  Recent Labs  05/13/13 0409  NA 139  K 4.7  CL 104  CO2 24  GLUCOSE 148*  BUN 42*  CREATININE 1.77*  CALCIUM 9.2  MG 2.7*    Liver function tests: No results found for this basename: AST, ALT, ALKPHOS, BILITOT, PROT, ALBUMIN,  in the last 72 hours No results found for this basename: LIPASE, AMYLASE,  in the last 72 hours  CBC: No results found for this basename: WBC, NEUTROABS, HGB, HCT, MCV, PLT,  in the last 72 hours  Cardiac Enzymes:  Recent Labs   05/12/13 1748 05/13/13 0141  TROPONINI 6.21* 8.93*    Coagulation Studies:  Recent Labs  05/12/13 2227 05/13/13 0409  LABPROT 27.9* 19.7*  INR 2.72* 1.72*    Other: No components found with this basename: POCBNP,  No results found for this basename: DDIMER,  in the last 72 hours No results found for this basename: HGBA1C,  in the last 72 hours  Recent Labs  05/13/13 0409  CHOL 172  HDL 47  LDLCALC 97  TRIG 141  CHOLHDL 3.7    Recent Labs  05/12/13 1744  TSH 10.857*   No results found for this basename: VITAMINB12, FOLATE, FERRITIN, TIBC, IRON, RETICCTPCT,  in the last 72 hours   Other results:  ECG [employer] LBBB, ST depression  Medications:    Infusions: . sodium chloride 50 mL/hr at 05/12/13 1415  . sodium chloride 20 mL/hr at 05/13/13 0323  . fentaNYL infusion INTRAVENOUS 50 mcg/hr (05/13/13 0615)  . heparin 1,500 Units/hr (05/13/13 0559)    Scheduled Medications: . antiseptic oral rinse  1 application Mouth Rinse QID  . aspirin EC  81 mg Oral Daily  . atorvastatin  20 mg Oral q1800  . chlorhexidine  15 mL Mouth/Throat BID  . furosemide  80 mg Intravenous Q12H  . insulin aspart  0-15 Units Subcutaneous Q4H  . lidocaine (cardiac) 100 mg/103ml      . pantoprazole (PROTONIX) IV  40 mg Intravenous Q24H  . piperacillin-tazobactam  3.375 g Intravenous Once  . piperacillin-tazobactam (ZOSYN)  IV  3.375 g Intravenous Q8H  . rocuronium      . sodium chloride  3 mL Intravenous Q12H  . [START ON 05/14/2013] vancomycin  1,500 mg Intravenous Q24H  . vancomycin  2,000 mg Intravenous Once    Assessment/ Plan:      Acute respiratory failure: I suspect he is having profound ischemia which lead to his respiratory failure   diastolic CHF :  His systolic function is actually well preserved.    HCAP (healthcare-associated pneumonia)    NSTEMI (non-ST elevated myocardial infarction)- I suspect that he has a critical LM ( or equivalent).  He has deep ST  depression and now has had respiratory failure.   I think we need to proceed with cath.  He is likely to need CABG.  His renal function is still decreased but is about as good as we can get.    His INR is low enough to do the cath.  2. CKD : stage 3  Alvia Grove., MD, [facility] 05/13/2013, 7:45 AM Office - [phone] Pager [phone]     Disposition:  Length of Stay: 1  Vesta Mixer, Montez Hageman., MD, [facility] 05/13/2013, 7:35 AM Office [phone] Pager [phone]

## 2013-05-13 NOTE — Transfer of Care (Signed)
Immediate Anesthesia Transfer of Care Note  Patient: [name]  Procedure(s) Performed: Procedure(s): CORONARY ARTERY BYPASS GRAFTING (CABG) (N/A)  Patient Location: ICU  Anesthesia Type:General  Level of Consciousness: unresponsive and Patient remains intubated per anesthesia plan  Airway & Oxygen Therapy: Patient remains intubated per anesthesia plan and Patient placed on Ventilator (see vital sign flow sheet for setting)  Post-op Assessment: Report given to PACU RN and Post -op Vital signs reviewed and stable  Post vital signs: Reviewed and stable  Complications: No apparent anesthesia complications

## 2013-05-13 NOTE — Plan of Care (Signed)
Problem: Consults Goal: Cardiac Surgery Patient Education ( See Patient Education module for education specifics.) Outcome: Completed/Met Date Met:  05/13/13 Dr. Laneta Simmers spoke w/family prior to OHS today.

## 2013-05-13 NOTE — Op Note (Signed)
CARDIOVASCULAR SURGERY OPERATIVE NOTE  05/13/2013  Surgeon:  Alleen Borne, MD  First Assistant: [name],  [facility]   Preoperative Diagnosis:  NSTEMI with flash pulmonary edema and cardiogenic shock due to high grade left main and 3-vessel coronary disease.  Postoperative Diagnosis:  Same   Procedure: Emergency  1. Median Sternotomy 2. Extracorporeal circulation 3.   Coronary artery bypass grafting x 4   Left internal mammary graft to the LAD  Seq SVG to intermediate and OM  SVG to PDA. 4.   Endoscopic vein harvest from the right and left legs   Anesthesia:  General Endotracheal   Clinical History/Surgical Indication:  Patient has no prior cardiac history but presented to [facility] yesterday am with a week of intermittent chest pain culminating in a 10/10 episode early yesterday morning. He ruled in for non-STEMI with troponin of 6. He was found to be in A-flutter with variable conduction. His pain improved with NTG and morphine but did not resolve and he was transferred to [facility] for further care. 2D echo showed EF of 45% with no valvular abnormalities. He was apparently stable overnight last night and early this am developed sudden pulmonary edema requiring intubation. He was taken to the cath lab and was in cardiogenic shock. Cath showed 90% distal LM and severe 3 vessel coronary disease with RCA occlusion. IABP was not inserted due to a 5 cm AAA. He was started on levophed and taken back to the CCU in hemodynamically stable condition with SBP 120. CXR now continues to show some pulmonary edema. His ABG on 100% shows a PO2 of 60 and PCO2 60. He has a hx of chronic kidney disease with creat 1.77 this am. He was also on coumadin for a hx of left leg DVT and atrial flutter. INR was 3.3 when admitted and he was given FFP and Vit K. Troponin this am was 8.9. CT scan at [facility] also showed a 8mm  exophytic left kidney mass. He has 90% left main, occluded proximal RCA and significant high-grade proximal stenoses in the LAD and LCX. He had mild to moderate LV dysfuntion by echo yesterday and now has had flash pulmonary edema and cardiogenic shock due to ischemia this am. He has stablized but remains critically ill on levophed. His only hope is emergent CABG. His operative risk is high for ventricular failure, respiratory failure with inability to adequately oxygenate and ventilate due to pulmonary edema and acute lung injury, renal failure requiring dialysis, death.  I discussed the operative procedure with the patient's wife and sons including alternatives, benefits and risks; including but not limited to bleeding, blood transfusion, infection, stroke, myocardial infarction, graft failure, heart block requiring a permanent pacemaker, organ dysfunction, and death. They understand and agree to proceed.      Preparation:  The patient was taken directly back to the OR room intubated from the CCU. The consent was signed by me. Preoperative antibiotics were given. A pulmonary arterial line and radial arterial line were placed by the anesthesia team. The patient was positioned supine on the operating room table. After being placed under general endotracheal anesthesia by the anesthesia team a foley catheter was placed. The neck, chest, abdomen, and both legs were prepped with betadine soap and solution and draped in the usual sterile manner. A surgical time-out was taken and the correct patient and operative procedure were confirmed with the nursing and anesthesia staff.  Pre-bypass TEE:  There was moderate LV dysfunction with severe hypokinesis of the inferior  and posterior walls. There was mild mitral regurgitation. No AS or AI. RV function looked mildly depressed.  Cardiopulmonary Bypass:  A median sternotomy was performed. The pericardium was opened in the midline. There was old pericarditis with  complete obliteration of the pericardial cavity by adhesions.Right ventricular function appeared normal. The ascending aorta was of normal size and had no palpable plaque. There were no contraindications to aortic cannulation or cross-clamping. The patient was fully systemically heparinized and the ACT was maintained > 400 sec. The proximal aortic arch was cannulated with a 22 F aortic cannula for arterial inflow. Venous cannulation was performed via the right atrial appendage using a two-staged venous cannula. An antegrade cardioplegia/vent cannula was inserted into the mid-ascending aorta. Aortic occlusion was performed with a single cross-clamp. Systemic cooling to 32 degrees Centigrade and topical cooling of the heart with iced saline were used. Hyperkalemic antegrade cold blood cardioplegia was used to induce diastolic arrest and was then given at about 20 minute intervals throughout the period of arrest to maintain myocardial temperature at or below 10 degrees centigrade. A temperature probe was inserted into the interventricular septum and an insulating pad was placed in the pericardium.   Left internal mammary harvest:  The left side of the sternum was retracted using the Rultract retractor. The left internal mammary artery was harvested as a pedicle graft. All side branches were clipped. It was a medium-sized vessel of good quality with excellent blood flow. It was ligated distally and divided. It was sprayed with topical papaverine solution to prevent vasospasm.   Endoscopic vein harvest:  The right greater saphenous vein was harvested endoscopically through a 2 cm incision medial to the right knee. It was harvested from the upper thigh to below the knee. It was a small to medium-sized vein of good quality. The side branches were all ligated with 4-0 silk ties. Another segment of greater saphenous vein was harvested endoscopically from the left thigh in a similar manner. It was of medium size and  good quality.   Coronary arteries:  The coronary arteries were examined.   LAD:  Mild distal disease  LCX:  The intermediate and OM branches were both deep intramyocardial throughout their length and took some time to find. They were both large vessels.  RCA:  The PDA was a small to medium sized vessel with minimal disease. The main RCA was diffusely diseased.   Grafts:  1. LIMA to the LAD: 1.75 mm. It was sewn end to side using 8-0 prolene continuous suture. 2. Seq SVG to intermediate:  1.75 mm. It was sewn side to side using 7-0 prolene continuous suture. 3. Seq SVG to OM:  1.75 mm. It was sewn end to side using 7-0 prolene continuous suture. 4. SVG to PDA :  1.6 mm. It was sewn end to side using 7-0 prolene continuous suture.  The proximal vein graft anastomoses were performed to the mid-ascending aorta using continuous 6-0 prolene suture. Graft markers were placed around the proximal anastomoses.   Completion:  The patient was rewarmed to 37 degrees Centigrade. The clamp was removed from the LIMA pedicle and there was rapid warming of the septum and return of ventricular fibrillation. The crossclamp was removed with a time of 123 minutes. There was spontaneous return of sinus rhythm. The distal and proximal anastomoses were checked for hemostasis. The position of the grafts was satisfactory. Two temporary epicardial pacing wires were placed on the right atrium and two on the right ventricle. The patient  was weaned from CPB without difficulty on milrinone 0.67mcg, dopamine , and NO at 20 ppm. The NO was started because of the patient's high PA pressures pre-bypass and poor oxygenation on 100% FIO2. CPB time was 158 minutes. Cardiac output was 6 LPM. Post-bypass TEE showed improved LV function with contractility resuming inferiorly and posteriorly. The MR remained mild.  Heparin was fully reversed with protamine and the aortic and venous cannulas removed. Hemostasis was achieved.  Mediastinal drainage tubes were placed. Both pleural spaces were obliterated by adhesions.The sternum was closed with double #6 stainless steel wires. The fascia was closed with continuous # 1 vicryl suture. The subcutaneous tissue was closed with 2-0 vicryl continuous suture. The skin was closed with 3-0 vicryl subcuticular suture. All sponge, needle, and instrument counts were reported correct at the end of the case. Dry sterile dressings were placed over the incisions and around the chest tubes which were connected to pleurevac suction. The patient was then transported to the surgical intensive care unit in critical but stable condition.

## 2013-05-13 NOTE — Progress Notes (Signed)
Patient ID: [name], male   DOB: 10-14-1934, 79 y.o.   MRN: [id number]  SICU Evening Rounds:  Hemodynamically stable. CI = 2.3.  CXR postop shows worsening aeration than preop with more opacity on the right than the left. It could be asymmetric edema or some component of aspiration. I am not sure what happened around the time of his intubation this am.  Urine output ok CT output low. CBC    Component Value Date/Time   WBC 7.8 05/13/2013 1900   RBC 4.02* 05/13/2013 1900   HGB 11.2* 05/13/2013 1931   HCT 33.0* 05/13/2013 1931   PLT 121* 05/13/2013 1900   MCV 83.8 05/13/2013 1900   MCH 26.6 05/13/2013 1900   MCHC 31.8 05/13/2013 1900   RDW 16.3* 05/13/2013 1900   LYMPHSABS 2.0 03/03/2011 0417   MONOABS 0.6 03/03/2011 0417   EOSABS 0.4 03/03/2011 0417   BASOSABS 0.0 03/03/2011 0417    BMET    Component Value Date/Time   NA 141 05/13/2013 1931   K 4.1 05/13/2013 1931   CL 104 05/13/2013 0409   CO2 24 05/13/2013 0409   GLUCOSE 105* 05/13/2013 1931   BUN 42* 05/13/2013 0409   CREATININE 1.77* 05/13/2013 0409   CALCIUM 9.2 05/13/2013 0409   GFRNONAA 35* 05/13/2013 0409   GFRAA 41* 05/13/2013 0409    Continue ventilator support. It will probably be at least a  few days before he can be weaned.

## 2013-05-13 NOTE — Interval H&P Note (Signed)
History and Physical Interval Note:  05/13/2013 8:49 AM  [name]  has presented today for urgent cardiac cth with the diagnosis of nstemi.   The various methods of treatment have been discussed with the patient and family. After consideration of risks, benefits and other options for treatment, the patient has consented to  Procedure(s): LEFT HEART CATHETERIZATION WITH CORONARY ANGIOGRAM (N/A) as a surgical intervention .  The patient's history has been reviewed, patient examined, no change in status, stable for surgery.  I have reviewed the patient's chart and labs.  Questions were answered to the patient's satisfaction.    Cath Lab Visit (complete for each Cath Lab visit)  Clinical Evaluation Leading to the Procedure:   ACS: yes  Non-ACS:    Anginal Classification: CCS IV  Anti-ischemic medical therapy: No Therapy  Non-Invasive Test Results: No non-invasive testing performed  Prior CABG: No previous CABG        Kenney Going

## 2013-05-13 NOTE — Progress Notes (Signed)
Dr. Laneta Simmers and Dr. Marchelle Gearing were both notified of 1050  ABG results. Spoke w/ Dr Marchelle Gearing via phone.  Increased rate to 30, increased peep to 10 post abg per [facility] Dr. Marchelle Gearing.

## 2013-05-13 NOTE — Progress Notes (Signed)
eLink Physician-Brief Progress Note Patient Name: [name] DOB: Feb 12, 1934 MRN: [id number]  Date of Service  05/13/2013   HPI/Events of Note  Call from bedside nurse reporting resp distress in patient who had been on IVFs and received FFP.  O2 sats on 100% NRB are in the 70s with good waveform.  Patient coughing with congested sounding cough.  Able to talk.  Is net positive greater than 1.5 liters.   eICU Interventions  Plan: ABG/PCXR BiPAP for resp support Lasix 80 mg IV times one dose now Continue to monitor patient   Intervention Category Intermediate Interventions: Respiratory distress - evaluation and management  DETERDING,ELIZABETH 05/13/2013, 3:34 AM

## 2013-05-13 NOTE — Progress Notes (Signed)
Attempted to place pt. On BIPAP 10/5, 8, 40%. Pt. Stated that he could not stand it & asked RT to please remove BIPAP. RN & RT explained the importance of BIPAP & the benefits of it as well. Pt. Still refuses at this time. SAT's are reading 70% on monitor. ABG results are as follows. Results were given to MD.    ABG    Component Value Date/Time   PHART 7.287* 05/13/2013 0350   PCO2ART 50.1* 05/13/2013 0350   PO2ART 39.0* 05/13/2013 0350   HCO3 23.9 05/13/2013 0350   TCO2 25 05/13/2013 0350   ACIDBASEDEF 3.0* 05/13/2013 0350   O2SAT 66.0 05/13/2013 0350

## 2013-05-13 NOTE — Progress Notes (Signed)
Pt received from OR on these settings with NO. No wean of Nitric per MD.

## 2013-05-13 NOTE — Progress Notes (Signed)
ANTICOAGULATION CONSULT NOTE - Follow Up Consult  Pharmacy Consult for heparin Indication: chest pain/ACS  No Known Allergies  Patient Measurements: Height: 6\' 1"  (185.4 cm) Weight: 243 lb 9.7 oz (110.5 kg) IBW/kg (Calculated) : 79.9 Heparin Dosing Weight: 103kg  Vital Signs: Temp: 99.3 F (37.4 C) (07/09 0732) Temp src: Oral (07/09 0732) BP: 109/58 mmHg (07/09 1015) Pulse Rate: 114 (07/09 1012)  Labs:  Recent Labs  05/12/13 1748 05/12/13 2227 05/13/13 0141 05/13/13 0409  LABPROT  --  27.9*  --  19.7*  INR  --  2.72*  --  1.72*  CREATININE  --   --   --  1.77*  TROPONINI 6.21*  --  8.93*  --     Estimated Creatinine Clearance: 45.5 ml/min (by C-G formula based on Cr of 1.77).   Medications:  Scheduled:  . aminocaproic acid (AMICAR) for OHS   Intravenous To OR  . antiseptic oral rinse  1 application Mouth Rinse QID  . aspirin EC  81 mg Oral Daily  . atorvastatin  80 mg Oral q1800  . cefUROXime (ZINACEF) 1.5 GM IVPB  1.5 g Intravenous To OR  . cefUROXime (ZINACEF)  IV  750 mg Intravenous To OR  . chlorhexidine  15 mL Mouth/Throat BID  . dexmedetomidine  0.1-0.7 mcg/kg/hr Intravenous To OR  . DOPamine  2-20 mcg/kg/min Intravenous To OR  . epinephrine  0.5-20 mcg/min Intravenous To OR  . furosemide  80 mg Intravenous Q12H  . heparin-papaverine-plasmalyte irrigation   Irrigation To OR  . heparin 30,000 units/NS 1000 mL solution for CELLSAVER   Other To OR  . insulin aspart  0-15 Units Subcutaneous Q4H  . insulin (NOVOLIN-R) infusion   Intravenous To OR  . lidocaine (cardiac) 100 mg/48ml      . magnesium sulfate  40 mEq Other To OR  . nitroGLYCERIN  2-200 mcg/min Intravenous To OR  . pantoprazole (PROTONIX) IV  40 mg Intravenous Q24H  . phenylephrine (NEO-SYNEPHRINE) Adult infusion  30-200 mcg/min Intravenous To OR  . piperacillin-tazobactam  3.375 g Intravenous Once  . piperacillin-tazobactam (ZOSYN)  IV  3.375 g Intravenous Q8H  . [START ON 05/14/2013] potassium  chloride  80 mEq Other To OR  . rocuronium      . sodium chloride  3 mL Intravenous Q12H  . sodium chloride  3 mL Intravenous Q12H  . vancomycin  1,500 mg Intravenous Q24H   Infusions:  . sodium chloride    . fentaNYL infusion INTRAVENOUS 100 mcg/hr (05/13/13 1015)  . midazolam (VERSED) infusion 3 mg/hr (05/13/13 1043)  . norepinephrine (LEVOPHED) Adult infusion      Assessment: 79 yo male s/p cath wi  severe 3V CAD. Patient noted with stat consult for CABG. Heparin has been ordered to begin 8 hrs post sheath removal. Last heparin rate was 1500 units/hr and patient went to cath before a level could be obtained.  Goal of Therapy:  Heparin level 0.3-0.7 units/ml Monitor platelets by anticoagulation protocol: Yes   Plan:  -Will follow plans for OR -If no plans for OR will restart heparin at 1500 units/hr 8 hrs post sheath removal  Harland German, Pharm D 05/13/2013 11:56 AM

## 2013-05-13 NOTE — Preoperative (Signed)
Beta Blockers   Reason not to administer Beta Blockers:Not Applicable 

## 2013-05-14 ENCOUNTER — Encounter (HOSPITAL_COMMUNITY): Payer: Self-pay | Admitting: Surgery

## 2013-05-14 ENCOUNTER — Inpatient Hospital Stay (HOSPITAL_COMMUNITY): Payer: Medicare Other

## 2013-05-14 LAB — URINALYSIS, ROUTINE W REFLEX MICROSCOPIC
Glucose, UA: NEGATIVE mg/dL
Leukocytes, UA: NEGATIVE
Protein, ur: 30 mg/dL — AB
pH: 5 (ref 5.0–8.0)

## 2013-05-14 LAB — CBC
HCT: 33.6 % — ABNORMAL LOW (ref 39.0–52.0)
HCT: 31.8 % — ABNORMAL LOW (ref 39.0–52.0)
Hemoglobin: 10.1 g/dL — ABNORMAL LOW (ref 13.0–17.0)
MCH: 26.1 pg (ref 26.0–34.0)
MCHC: 31.3 g/dL (ref 30.0–36.0)
MCHC: 31.8 g/dL (ref 30.0–36.0)
MCV: 83.4 fL (ref 78.0–100.0)
RBC: 3.81 MIL/uL — ABNORMAL LOW (ref 4.22–5.81)
RDW: 16.2 % — ABNORMAL HIGH (ref 11.5–15.5)

## 2013-05-14 LAB — POCT I-STAT 3, ART BLOOD GAS (G3+)
Acid-base deficit: 5 mmol/L — ABNORMAL HIGH (ref 0.0–2.0)
Bicarbonate: 20.1 mEq/L (ref 20.0–24.0)
Bicarbonate: 18.6 mEq/L — ABNORMAL LOW (ref 20.0–24.0)
O2 Saturation: 94 %
O2 Saturation: 94 %
Patient temperature: 37.6
Patient temperature: 37.6
Patient temperature: 37.5
TCO2: 21 mmol/L (ref 0–100)
TCO2: 22 mmol/L (ref 0–100)
pCO2 arterial: 39.1 mmHg (ref 35.0–45.0)
pH, Arterial: 7.322 — ABNORMAL LOW (ref 7.350–7.450)
pH, Arterial: 7.296 — ABNORMAL LOW (ref 7.350–7.450)
pH, Arterial: 7.327 — ABNORMAL LOW (ref 7.350–7.450)
pO2, Arterial: 101 mmHg — ABNORMAL HIGH (ref 80.0–100.0)
pO2, Arterial: 81 mmHg (ref 80.0–100.0)

## 2013-05-14 LAB — GLUCOSE, CAPILLARY
Glucose-Capillary: 102 mg/dL — ABNORMAL HIGH (ref 70–99)
Glucose-Capillary: 120 mg/dL — ABNORMAL HIGH (ref 70–99)
Glucose-Capillary: 121 mg/dL — ABNORMAL HIGH (ref 70–99)
Glucose-Capillary: 123 mg/dL — ABNORMAL HIGH (ref 70–99)
Glucose-Capillary: 144 mg/dL — ABNORMAL HIGH (ref 70–99)
Glucose-Capillary: 95 mg/dL (ref 70–99)
Glucose-Capillary: 95 mg/dL (ref 70–99)

## 2013-05-14 LAB — BASIC METABOLIC PANEL
BUN: 42 mg/dL — ABNORMAL HIGH (ref 6–23)
Creatinine, Ser: 2.13 mg/dL — ABNORMAL HIGH (ref 0.50–1.35)
GFR calc Af Amer: 33 mL/min — ABNORMAL LOW (ref >90)
GFR calc non Af Amer: 28 mL/min — ABNORMAL LOW (ref >90)
Glucose, Bld: 136 mg/dL — ABNORMAL HIGH (ref 70–99)

## 2013-05-14 LAB — CREATININE, SERUM
Creatinine, Ser: 2.52 mg/dL — ABNORMAL HIGH (ref 0.50–1.35)
GFR calc non Af Amer: 23 mL/min — ABNORMAL LOW (ref >90)

## 2013-05-14 LAB — URINE MICROSCOPIC-ADD ON

## 2013-05-14 LAB — POCT I-STAT, CHEM 8
Calcium, Ion: 1.02 mmol/L — ABNORMAL LOW (ref 1.13–1.30)
Chloride: 111 mEq/L (ref 96–112)
HCT: 31 % — ABNORMAL LOW (ref 39.0–52.0)
Hemoglobin: 10.5 g/dL — ABNORMAL LOW (ref 13.0–17.0)

## 2013-05-14 LAB — MAGNESIUM: Magnesium: 3 mg/dL — ABNORMAL HIGH (ref 1.5–2.5)

## 2013-05-14 MED ORDER — MIDAZOLAM HCL 2 MG/2ML IJ SOLN
INTRAMUSCULAR | Status: AC
  Filled 2013-05-14: qty 2

## 2013-05-14 MED ORDER — AMIODARONE HCL IN DEXTROSE 360-4.14 MG/200ML-% IV SOLN
60.0000 mg/h | INTRAVENOUS | Status: AC
  Administered 2013-05-15: 60 mg/h via INTRAVENOUS
  Filled 2013-05-14: qty 200

## 2013-05-14 MED ORDER — INSULIN ASPART 100 UNIT/ML ~~LOC~~ SOLN
0.0000 [IU] | SUBCUTANEOUS | Status: DC
  Administered 2013-05-14 – 2013-05-15 (×5): 2 [IU] via SUBCUTANEOUS

## 2013-05-14 MED ORDER — ALBUTEROL SULFATE (5 MG/ML) 0.5% IN NEBU
2.5000 mg | INHALATION_SOLUTION | RESPIRATORY_TRACT | Status: DC
  Administered 2013-05-14 – 2013-05-16 (×12): 2.5 mg via RESPIRATORY_TRACT
  Filled 2013-05-14 (×12): qty 0.5

## 2013-05-14 MED ORDER — AMIODARONE HCL IN DEXTROSE 360-4.14 MG/200ML-% IV SOLN
30.0000 mg/h | INTRAVENOUS | Status: DC
  Administered 2013-05-15 – 2013-05-18 (×9): 30 mg/h via INTRAVENOUS
  Filled 2013-05-14 (×24): qty 200

## 2013-05-14 MED ORDER — AMIODARONE HCL IN DEXTROSE 360-4.14 MG/200ML-% IV SOLN
INTRAVENOUS | Status: AC
  Administered 2013-05-14: 60 mg/h via INTRAVENOUS
  Filled 2013-05-14: qty 200

## 2013-05-14 MED ORDER — IPRATROPIUM BROMIDE 0.02 % IN SOLN
0.5000 mg | RESPIRATORY_TRACT | Status: DC
  Administered 2013-05-14 – 2013-05-16 (×12): 0.5 mg via RESPIRATORY_TRACT
  Filled 2013-05-14 (×12): qty 2.5

## 2013-05-14 MED ORDER — INSULIN ASPART 100 UNIT/ML ~~LOC~~ SOLN
0.0000 [IU] | SUBCUTANEOUS | Status: AC
  Administered 2013-05-14: 2 [IU] via SUBCUTANEOUS
  Administered 2013-05-14: 1 [IU] via SUBCUTANEOUS
  Administered 2013-05-14: 2 [IU] via SUBCUTANEOUS

## 2013-05-14 MED ORDER — AMIODARONE LOAD VIA INFUSION
150.0000 mg | Freq: Once | INTRAVENOUS | Status: AC
  Administered 2013-05-14: 150 mg via INTRAVENOUS
  Filled 2013-05-14: qty 83.34

## 2013-05-14 MED ORDER — CHLORHEXIDINE GLUCONATE 0.12 % MT SOLN
15.0000 mL | Freq: Two times a day (BID) | OROMUCOSAL | Status: DC
Start: 2013-05-14 — End: 2013-05-25
  Administered 2013-05-14 – 2013-05-24 (×16): 15 mL via OROMUCOSAL
  Filled 2013-05-14 (×16): qty 15

## 2013-05-14 MED ORDER — ENOXAPARIN SODIUM 30 MG/0.3ML ~~LOC~~ SOLN
30.0000 mg | SUBCUTANEOUS | Status: DC
  Administered 2013-05-14 – 2013-05-19 (×6): 30 mg via SUBCUTANEOUS
  Filled 2013-05-14 (×7): qty 0.3

## 2013-05-14 MED FILL — Nitroglycerin IV Soln 200 MCG/ML in D5W: INTRAVENOUS | Qty: 250 | Status: AC

## 2013-05-14 MED FILL — Sodium Chloride Irrigation Soln 0.9%: Qty: 1000 | Status: AC

## 2013-05-14 MED FILL — Mannitol IV Soln 20%: INTRAVENOUS | Qty: 500 | Status: AC

## 2013-05-14 MED FILL — Sodium Bicarbonate IV Soln 8.4%: INTRAVENOUS | Qty: 50 | Status: AC

## 2013-05-14 MED FILL — Ondansetron HCl Inj 4 MG/2ML (2 MG/ML): INTRAMUSCULAR | Qty: 2 | Status: AC

## 2013-05-14 MED FILL — Lidocaine HCl IV Inj 20 MG/ML: INTRAVENOUS | Qty: 5 | Status: AC

## 2013-05-14 MED FILL — Electrolyte-R (PH 7.4) Solution: INTRAVENOUS | Qty: 4000 | Status: AC

## 2013-05-14 MED FILL — Sodium Chloride IV Soln 0.9%: INTRAVENOUS | Qty: 1000 | Status: AC

## 2013-05-14 MED FILL — Heparin Sodium (Porcine) Inj 1000 Unit/ML: INTRAMUSCULAR | Qty: 10 | Status: AC

## 2013-05-14 NOTE — Consult Note (Signed)
WOC consult requested yesterday for left lower extremity. Pt was in cath lab, then OR. According to family members in room yesterday, he has been followed by the outpatient wound center with dressings changed weekly.  Pt had open heart surgery and dressings were removed at that time.  Currently, there is no open wound or drainage to left leg.  Pink dry scar tissue; no topical treatment needed.   Please re-consult if further assistance is needed.  Thank-you,  Cammie Mcgee MSN, RN, CWOCN, [place], CNS [phone]

## 2013-05-14 NOTE — Progress Notes (Signed)
Intubated, sedated  BP 109/47  Pulse 106  Temp(Src) 99.5 F (37.5 C) (Oral)  Resp 26  Ht 6\' 1"  (1.854 m)  Wt 267 lb 3.2 oz (121.2 kg)  BMI 35.26 kg/m2  SpO2 92%   Intake/Output Summary (Last 24 hours) at 05/14/13 1722 Last data filed at 05/14/13 1700  Gross per 24 hour  Intake 6049.42 ml  Output   2165 ml  Net 3884.42 ml   Almost off NO  Still on levophed, milrinone and dopamine  Hct 31, BMET pending

## 2013-05-14 NOTE — Progress Notes (Signed)
INITIAL NUTRITION ASSESSMENT  DOCUMENTATION CODES Per approved criteria  -Obesity Unspecified   INTERVENTION: 1. If pt to remain intubated, recommend initiaton of enteral nutrition.  2. If enteral nutrition is warranted, recommend initiation of Promote via OG tube at a rate of 20 ml/hr and advance by 10 ml q 4 hr to a goal rate of 30 ml/hr. With 60 ml Pro-stat QID. This enteral nutrition regimen at goal would provide 1520 kcal (70% kcal needs), 165 gm protein (98% protein needs) and 604 ml free water daily.   NUTRITION DIAGNOSIS: Inadequate oral intake related to inability to eat as evidenced by NPO with mechanical ventilation.   Goal: Enteral nutrition to provide 60-70% of estimated calorie needs (22-25 kcals/kg ideal body weight) and 100% of estimated protein needs, based on ASPEN guidelines for permissive underfeeding in critically ill obese individuals.   Monitor:  Vent status, initiation of enteral nutrition, weight trends, labs   Reason for Assessment: VRDF  79 y.o. male  Admitting Dx: NSTEMI  ASSESSMENT: Pt transferred from [facility] related to STEMI. Pt required emergent intubation related to acute respiratory failure in the setting of ischemia. S/p cardiac cath and CABG on 7/9. Remains intubated at this time and will likely take some time to wean per notes. Recommend initiation of enteral nutrition to meet nutrition needs while pt remains intubated.   Height: Ht Readings from Last 1 Encounters:  05/12/13 6\' 1"  (1.854 m)    Weight: Wt Readings from Last 1 Encounters:  05/14/13 267 lb 3.2 oz (121.2 kg)    Ideal Body Weight: 184 lbs   % Ideal Body Weight: 145%  Wt Readings from Last 10 Encounters:  05/14/13 267 lb 3.2 oz (121.2 kg)  05/14/13 267 lb 3.2 oz (121.2 kg)  05/14/13 267 lb 3.2 oz (121.2 kg)  01/06/12 235 lb (106.595 kg)  Was 243 lbs on admission, now elevated likely related to fluids as pt is 6 L + balance this admission   Usual Body Weight:  235 lbs   % Usual Body Weight: 103%  BMI:  Body mass index is 35.26 kg/(m^2). Obesity class 2  Patient is currently intubated on ventilator support.  MV: 10.6  Temp:Temp (24hrs), Avg:99 F (37.2 C), Min:96.4 F (35.8 C), Max:100 F (37.8 C)  Propofol: none   Estimated Nutritional Needs: Kcal: 2145 Underfeeding goal: 1287-1502 kcal Protein: >/= 168 gm  Fluid: 1.3-1.5 L   Skin: incisions in chest, R and L leg, tibial wound   Diet Order: NPO  EDUCATION NEEDS: -No education needs identified at this time   Intake/Output Summary (Last 24 hours) at 05/14/13 1437 Last data filed at 05/14/13 1200  Gross per 24 hour  Intake 5926.68 ml  Output   2125 ml  Net 3801.68 ml    Last BM: PTA    Labs:   Recent Labs Lab 05/13/13 0409  05/13/13 1818 05/13/13 1931 05/14/13 0400  NA 139  < > 139 141 138  K 4.7  < > 4.3 4.1 4.5  CL 104  --   --   --  108  CO2 24  --   --   --  21  BUN 42*  --   --   --  42*  CREATININE 1.77*  --   --   --  2.13*  CALCIUM 9.2  --   --   --  7.5*  MG 2.7*  --   --   --  2.8*  GLUCOSE 148*  < >  102* 105* 136*  < > = values in this interval not displayed.  CBG (last 3)   Recent Labs  05/14/13 0359 05/14/13 0649 05/14/13 1208  GLUCAP 123* 120* 137*   No results found for this basename: HGBA1C    Scheduled Meds: . acetaminophen  1,000 mg Oral Q6H   Or  . acetaminophen (TYLENOL) oral liquid 160 mg/5 mL  975 mg Per Tube Q6H  . ipratropium  0.5 mg Nebulization Q4H   And  . albuterol  2.5 mg Nebulization Q4H  . aspirin EC  325 mg Oral Daily   Or  . aspirin  324 mg Per Tube Daily  . atorvastatin  80 mg Oral q1800  . bisacodyl  10 mg Oral Daily   Or  . bisacodyl  10 mg Rectal Daily  . chlorhexidine  15 mL Mouth/Throat BID  . docusate sodium  200 mg Oral Daily  . enoxaparin (LOVENOX) injection  30 mg Subcutaneous Q24H  . insulin aspart  0-24 Units Subcutaneous Q4H  . metoprolol tartrate  12.5 mg Oral BID   Or  . metoprolol tartrate   12.5 mg Per Tube BID  . [START ON 05/15/2013] pantoprazole  40 mg Oral Daily  . piperacillin-tazobactam (ZOSYN)  IV  3.375 g Intravenous Q8H  . sodium chloride  3 mL Intravenous Q12H  . vancomycin  1,500 mg Intravenous Q24H    Continuous Infusions: . sodium chloride 20 mL/hr at 05/14/13 1300  . sodium chloride 10 mL/hr at 05/14/13 1300  . sodium chloride    . dexmedetomidine 0.999 mcg/kg/hr (05/14/13 1300)  . DOPamine 5.02 mcg/kg/min (05/14/13 1300)  . lactated ringers    . milrinone 0.3 mcg/kg/min (05/14/13 1300)  . nitroGLYCERIN Stopped (05/13/13 2105)  . norepinephrine (LEVOPHED) Adult infusion 9 mcg/min (05/14/13 1300)  . phenylephrine (NEO-SYNEPHRINE) Adult infusion Stopped (05/14/13 0500)    Past Medical History  Diagnosis Date  . Cancer   . Hypertension   . Collagen vascular disease   . Anginal pain   . Depression   . Arthritis     Past Surgical History  Procedure Laterality Date  . Eye surgery    . Coronary artery bypass graft N/A 05/13/2013    Procedure: CORONARY ARTERY BYPASS GRAFTING (CABG);  Surgeon: Alleen Borne, MD;  Location: [facility] OR;  Service: Open Heart Surgery;  Laterality: N/A;    Clarene Duke RD, LDN Pager [phone] After Hours pager [phone]

## 2013-05-14 NOTE — Progress Notes (Signed)
TCTS DAILY ICU PROGRESS NOTE                   [address]            [name] [address]          [phone]   1 Day Post-Op Procedure(s) (LRB): CORONARY ARTERY BYPASS GRAFTING (CABG) (N/A)  Total Length of Stay:  LOS: 2 days   Subjective: Sedated on vent, awakes intermittently, MAEX4  Objective: Vital signs in last 24 hours: Temp:  [96.4 F (35.8 C)-100 F (37.8 C)] 99.9 F (37.7 C) (07/10 0700) Pulse Rate:  [89-114] 89 (07/10 0700) Cardiac Rhythm:  [-]  Resp:  [12-30] 16 (07/10 0700) BP: (70-150)/(41-95) 109/47 mmHg (07/10 0324) SpO2:  [91 %-100 %] 96 % (07/10 0700) Arterial Line BP: (87-115)/(37-48) 88/40 mmHg (07/10 0700) FiO2 (%):  [80 %-100 %] 80 % (07/10 0324) Weight:  [267 lb 3.2 oz (121.2 kg)] 267 lb 3.2 oz (121.2 kg) (07/10 0500)  Filed Weights   05/12/13 1411 05/14/13 0500  Weight: 243 lb 9.7 oz (110.5 kg) 267 lb 3.2 oz (121.2 kg)    Weight change: 23 lb 9.4 oz (10.7 kg)   Hemodynamic parameters for last 24 hours: PAP: (15-28)/(8-19) 17/11 mmHg CO:  [4.9 L/min-5.3 L/min] 4.9 L/min CI:  [2.1 L/min/m2-2.4 L/min/m2] 2.1 L/min/m2  Intake/Output from previous day: 07/09 0701 - 07/10 0700 In: 5796.8 [I.V.:4106.8; Blood:690; IV Piggyback:1000] Out: 1860 [Urine:1750; Chest Tube:110]  Intake/Output this shift:    Current Meds: Scheduled Meds: . acetaminophen  1,000 mg Oral Q6H   Or  . acetaminophen (TYLENOL) oral liquid 160 mg/5 mL  975 mg Per Tube Q6H  . aspirin EC  325 mg Oral Daily   Or  . aspirin  324 mg Per Tube Daily  . atorvastatin  80 mg Oral q1800  . bisacodyl  10 mg Oral Daily   Or  . bisacodyl  10 mg Rectal Daily  . docusate sodium  200 mg Oral Daily  . famotidine (PEPCID) IV  20 mg Intravenous Q12H  . insulin aspart  0-24 Units Subcutaneous Q2H   Followed by  . insulin aspart  0-24 Units Subcutaneous Q4H  . insulin regular  0-10 Units Intravenous TID WC  . metoCLOPramide (REGLAN) injection  10 mg Intravenous Q6H  .  metoprolol tartrate  12.5 mg Oral BID   Or  . metoprolol tartrate  12.5 mg Per Tube BID  . [START ON 05/15/2013] pantoprazole  40 mg Oral Daily  . piperacillin-tazobactam (ZOSYN)  IV  3.375 g Intravenous Q8H  . sodium chloride  3 mL Intravenous Q12H  . vancomycin  1,500 mg Intravenous Q24H   Continuous Infusions: . sodium chloride 20 mL/hr at 05/13/13 2056  . sodium chloride 10 mL/hr at 05/13/13 1945  . sodium chloride    . dexmedetomidine 0.7 mcg/kg/hr (05/14/13 0200)  . DOPamine 5 mcg/kg/min (05/13/13 1945)  . lactated ringers    . milrinone 0.3 mcg/kg/min (05/14/13 0200)  . nitroGLYCERIN Stopped (05/13/13 2105)  . norepinephrine (LEVOPHED) Adult infusion 8 mcg/min (05/14/13 0700)  . phenylephrine (NEO-SYNEPHRINE) Adult infusion Stopped (05/14/13 0500)   PRN Meds:.albumin human, metoprolol, midazolam, morphine injection, ondansetron (ZOFRAN) IV, oxyCODONE, sodium chloride  General appearance: currently on vent Neurologic: nursing reports he is moving extrems. Heart: regular rate and rhythm and S1, S2 normal Lungs: mostly clear anteriorly Abdomen: soft, nondistended Extremities: + mild LE edema Wound: incis dressed, CDI  Lab Results: CBC: Recent Labs  05/13/13  1900 05/13/13 1931 05/14/13 0400  WBC 7.8  --  11.1*  HGB 10.7* 11.2* 10.5*  HCT 33.7* 33.0* 33.6*  PLT 121*  --  133*   BMET:  Recent Labs  05/13/13 0409  05/13/13 1931 05/14/13 0400  NA 139  < > 141 138  K 4.7  < > 4.1 4.5  CL 104  --   --  108  CO2 24  --   --  21  GLUCOSE 148*  < > 105* 136*  BUN 42*  --   --  42*  CREATININE 1.77*  --   --  2.13*  CALCIUM 9.2  --   --  7.5*  < > = values in this interval not displayed.  PT/INR:  Recent Labs  05/13/13 1900  LABPROT 27.8*  INR 2.71*   ABG    Component Value Date/Time   PHART 7.284* 05/13/2013 1937   PCO2ART 45.7* 05/13/2013 1937   PO2ART 69.0* 05/13/2013 1937   HCO3 21.8 05/13/2013 1937   TCO2 23 05/13/2013 1937   ACIDBASEDEF 5.0* 05/13/2013 1937     O2SAT 92.0 05/13/2013 1937   Vent Mode:  [-] SIMV/PC/PS FiO2 (%):  [80 %-100 %] 80 % Set Rate:  [16 bmp-30 bmp] 16 bmp Vt Set:  [630 mL-650 mL] 650 mL PEEP:  [8 cmH20-10 cmH20] 10 cmH20 Pressure Support:  [10 cmH20] 10 cmH20 Plateau Pressure:  [22 cmH20-29 cmH20] 24 cmH20`   Radiology: Dg Chest Portable 1 View  05/13/2013   *RADIOLOGY REPORT*  Clinical Data: Post cardiac surgery  PORTABLE CHEST - 1 VIEW  Comparison: Portable exam 1948 hours compared to 1156 hrs  Findings: Endotracheal tube, nasogastric tube, and mediastinal drains stable. New right jugular Swan-Ganz catheter with tip projecting over right pulmonary artery. Enlargement of cardiac silhouette with pulmonary vascular congestion postsurgical changes of CABG. Bilateral airspace infiltrates right greater than left slightly increased since previous exam, likely asymmetric edema. Increased left basilar atelectasis and pleural effusion. No pneumothorax.  IMPRESSION: No pneumothorax following Swan-Ganz catheter placement. Worsened aeration since earlier exam.   Original Report Authenticated By: Ulyses Southward, M.D.   Dg Chest Port 1 View  05/13/2013   *RADIOLOGY REPORT*  Clinical Data: Acute pulmonary edema, follow-up  PORTABLE CHEST - 1 VIEW  Comparison: Portable chest x-ray of 05/13/2013  Findings: The airspace disease is predominately throughout the right mid and lower lung field.  Although this could represent residual asymmetrical edema, pneumonia is also a consideration in view of the asymmetry.  The endotracheal tip is approximately 6.1 cm above the carina.  Cardiomegaly is stable.  IMPRESSION: Airspace disease throughout the right mid and lower lung field may represent residual asymmetrical edema, but pneumonia is a definite consideration.   Original Report Authenticated By: Dwyane Dee, M.D.   Dg Chest Portable 1 View  05/13/2013   *RADIOLOGY REPORT*  Clinical Data: Post intubation.  PORTABLE CHEST - 1 VIEW  Comparison: 05/13/2013 at 0451  hours  Findings: Interval placement endotracheal tube with tip about 4.8 cm above the carina.  Enteric tube has been placed.  The tip is off the field of view but is below the left hemidiaphragm.  Mild cardiac enlargement.  Persistent airspace disease in both lungs, demonstrating mild increase on the left.  Probable small left pleural effusion.  IMPRESSION: Appliances appear to be in satisfactory position.  Persistent bilateral airspace infiltrates in the lungs, increasing on the left.   Original Report Authenticated By: Burman Nieves, M.D.   Dg Chest [facility]  1 View  05/13/2013   *RADIOLOGY REPORT*  Clinical Data: Shortness of breath and chest pain increasing tonight.  PORTABLE CHEST - 1 VIEW  Comparison: 05/12/2013  Findings: Cardiac enlargement with mild pulmonary vascular congestion.  Since the previous study, since the previous study, there is interval development of airspace disease throughout the right lung and in the left lung base.  Changes suggest pneumonia. Aspiration or asymmetric edema can also have this appearance.  No blunting of costophrenic angles.  No pneumothorax.  Calcification of the aorta.  IMPRESSION: Developing airspace disease throughout the right lung and in the left lung base since previous study.  Persistent cardiac enlargement with mild pulmonary vascular congestion.   Original Report Authenticated By: Burman Nieves, M.D.     Assessment/Plan: S/P Procedure(s) (LRB): CORONARY ARTERY BYPASS GRAFTING (CABG) (N/A)  1 stable on support 2 Vent dep. Currently, on 80 %, secretions limiting gas exchange somewhat with A-a grad 25. Some metabolic acidosis.add nebs 3 Cardiac index adeq on current inotropes, wean nitric off , then milrinone/levophed as able 4 ARI , creat 2.13 monitor closely, UOP good overnight prob multifactorial, DYE/Shock/pressors 5 H/H stable, keep tubes for now, while on vent  GOLD,WAYNE E 05/14/2013 7:38 AM

## 2013-05-14 NOTE — Progress Notes (Signed)
PULMONARY  / CRITICAL CARE MEDICINE  Name: [name] MRN: [id number] DOB: December 31, 1933    ADMISSION DATE:  05/12/2013 CONSULTATION DATE:  05/13/2013  REFERRING MD :  Cardiology PRIMARY SERVICE:  Cardiology  CHIEF COMPLAINT:  Acute respiratory failure  BRIEF PATIENT DESCRIPTION: 79 yo with past medical history of RA, LE DVT, AF on Coumadin transferred from [facility] on 7/8 with NSTEMI.  Cardiac cath postponed secondary to coagulopathy / elevated Cr.  On 7/9 developed hypoxemic respiratory failure requiring intubation. Cath w 3 vessel dz, underwent CABG 05/13/13.   SIGNIFICANT EVENTS / STUDIES:  7/8  Transferred from [facility] with NSTEMI 7/8  TTE >>> EF=45-50, regional wall motion abnormalities, diastolic dysfunction 7/9  Intubated for hypoxemic respiratory failure 7/9 L cath >> 3 vessel dz 7/9 CABG (Dr Laneta Simmers)  LINES / TUBES: OETT 7/9 >>> OGT 7/9 >>> Foley 7/9 >>> R IJ cordis 7/9 >>  L radial art 7/9 >>   CULTURES: MRSA screen 7/9 >> positive  ANTIBIOTICS: Zosyn 7/9 >>> Vancomycin 7/9 >>>  INTERVAL HISTORY: S/p CABG 7/9, back to 2300 Remains on nitric FiO2 0.70, PEEP 10  VITAL SIGNS: Temp:  [96.4 F (35.8 C)-100 F (37.8 C)] 99.7 F (37.6 C) (07/10 0900) Pulse Rate:  [89-102] 93 (07/10 0916) Resp:  [12-30] 16 (07/10 0916) BP: (70-134)/(41-92) 109/47 mmHg (07/10 0324) SpO2:  [91 %-99 %] 95 % (07/10 0916) Arterial Line BP: (87-115)/(37-48) 110/44 mmHg (07/10 0900) FiO2 (%):  [70 %-100 %] 70 % (07/10 0916) Weight:  [121.2 kg (267 lb 3.2 oz)] 121.2 kg (267 lb 3.2 oz) (07/10 0500)  HEMODYNAMICS: PAP: (15-28)/(8-19) 19/9 mmHg CO:  [4.9 L/min-5.3 L/min] 4.9 L/min CI:  [2.1 L/min/m2-2.4 L/min/m2] 2.1 L/min/m2 VENTILATOR SETTINGS: Vent Mode:  [-] SIMV/PC/PS FiO2 (%):  [70 %-100 %] 70 % Set Rate:  [16 bmp-30 bmp] 16 bmp Vt Set:  [630 mL-650 mL] 650 mL PEEP:  [10 cmH20] 10 cmH20 Pressure Support:  [10 cmH20] 10 cmH20 Plateau Pressure:  [22 cmH20-29  cmH20] 23 cmH20  INTAKE / OUTPUT: Intake/Output     07/09 0701 - 07/10 0700 07/10 0701 - 07/11 0700   P.O.     I.V. (mL/kg) 4106.8 (33.9) 130.8 (1.1)   Blood 690    IV Piggyback 1000 50   Total Intake(mL/kg) 5796.8 (47.8) 180.8 (1.5)   Urine (mL/kg/hr) 1750 (0.6)    Chest Tube 110 (0)    Total Output 1860     Net +3936.8 +180.8          PHYSICAL EXAMINATION: General:  Appears acutely ill, mechanically ventilated, synchronous Neuro:  sedated, nonfocal, cough / gag diminished HEENT:  PERRL, OETT / OGT Cardiovascular:  Regular, distant Lungs:  Bilateral diminished air entry, bibasilar rales Abdomen:  Soft, distended, nontender, bowel sounds diminished Musculoskeletal:  Moves all extremities, bilateral pitting edema Skin:  LLE pink scar  LABS:  Recent Labs Lab 05/13/13 1715  05/13/13 1900 05/13/13 1931 05/14/13 0400  HGB 8.9*  < > 10.7* 11.2* 10.5*  HCT 27.8*  < > 33.7* 33.0* 33.6*  WBC  --   --  7.8  --  11.1*  PLT 127*  --  121*  --  133*  < > = values in this interval not displayed.  Recent Labs Lab 05/13/13 0409  05/13/13 1533 05/13/13 1700 05/13/13 1719 05/13/13 1818 05/13/13 1931 05/14/13 0400  NA 139  < > 139  --  137 139 141 138  K 4.7  < > 4.2  --  4.5 4.3 4.1 4.5  CL 104  --   --   --   --   --   --  108  CO2 24  --   --   --   --   --   --  21  GLUCOSE 148*  < > 106* 111* 106* 102* 105* 136*  BUN 42*  --   --   --   --   --   --  42*  CREATININE 1.77*  --   --   --   --   --   --  2.13*  CALCIUM 9.2  --   --   --   --   --   --  7.5*  MG 2.7*  --   --   --   --   --   --  2.8*  < > = values in this interval not displayed.  Recent Labs Lab 05/13/13 1519 05/13/13 1536 05/13/13 1821 05/13/13 1937 05/14/13 0802  PHART 7.300* 7.293* 7.326* 7.284* 7.322*  PCO2ART 43.4 52.8* 42.6 45.7* 39.1  PO2ART 55.0* 348.0* 53.0* 69.0* 101.0*  HCO3 21.4 25.6* 22.3 21.8 20.1  TCO2 23 27 24 23 21   O2SAT 85.0 100.0 84.0 92.0 97.0    Recent Labs Lab  05/13/13 0409  PROCALCITON 0.10    CXR:  7/9 >>> Hardware in good position, bilateral alveolar airspace disease R>L  ASSESSMENT / PLAN:  PULMONARY A:  Acute hypoxemic / hypercarbic respiratory failure.  Acute cardiogenic pulmonary edema vs aspiration pneumonia (asymmettric R sided airspace disease, distended abdomen, encephalopathy). P:   Change SIMV to PRVC since he will not fast-track to extubation Decrease Vt, lung protective strategy given the potential here for ARDS (suspect that this is cardiogenic edema) wean FiO2 and PEEP as tolerated, hopefully some improvement w volume removal Wean nitric oxide over next 24 hours   CARDIOVASCULAR A: NSTEMI.  AF/RVR, improved after intubated.  Acute on chronic systolic and diastolic CHF.  Hypotension. P:  ASA, Lipitor Holding beta-blocker and ACEI due to hypotension Dopamine, wean as able. Milrinone at steady dose Anticoagulation as below  RENAL A:  Acute on Chronic renal failure.  Fluid overload. P:   Goal K>4, Mg>2 Trend BMP Lasix held, consider restart when BP and S Cr stabilize  GASTROINTESTINAL A:  Nonspecific abdominal distension. P:   OGT to Sx Protonix for GI Px  HEMATOLOGIC A:  Coumadin induced coagulopathy. P:  Trend CBC Trend APTT / INR  INFECTIOUS A:  Possible aspiration pneumonia / HCAP. P:   Follow Pct, initial value reassuring 7/9 vanco + zosyn 7/9 >> narrow based on cx's and clinical course  ENDOCRINE  A:  Hyperglycemia.  No h/o DM.   P:   SSI  NEUROLOGIC A:  Acute encephalopathy. P:   Goal RASS 0 to -1 Fentanyl gtt precedex   I have personally obtained a history, examined the patient, evaluated laboratory and imaging results, formulated the assessment and plan and placed orders.  CRITICAL CARE:  The patient is critically ill with multiple organ systems failure and requires high complexity decision making for assessment and support, frequent evaluation and titration of therapies, application  of advanced monitoring technologies and extensive interpretation of multiple databases. Critical Care Time devoted to patient care services described in this note is 45 minutes.   Levy Pupa, MD, PhD 05/14/2013, 10:42 AM [facility] [phone] or if no answer [phone]

## 2013-05-15 ENCOUNTER — Inpatient Hospital Stay (HOSPITAL_COMMUNITY): Payer: Medicare Other

## 2013-05-15 LAB — GLUCOSE, CAPILLARY
Glucose-Capillary: 149 mg/dL — ABNORMAL HIGH (ref 70–99)
Glucose-Capillary: 118 mg/dL — ABNORMAL HIGH (ref 70–99)
Glucose-Capillary: 116 mg/dL — ABNORMAL HIGH (ref 70–99)
Glucose-Capillary: 114 mg/dL — ABNORMAL HIGH (ref 70–99)
Glucose-Capillary: 107 mg/dL — ABNORMAL HIGH (ref 70–99)

## 2013-05-15 LAB — POCT I-STAT 3, ART BLOOD GAS (G3+)
O2 Saturation: 89 %
pCO2 arterial: 31.6 mmHg — ABNORMAL LOW (ref 35.0–45.0)
pH, Arterial: 7.305 — ABNORMAL LOW (ref 7.350–7.450)
pO2, Arterial: 63 mmHg — ABNORMAL LOW (ref 80.0–100.0)

## 2013-05-15 LAB — CBC
HCT: 31.4 % — ABNORMAL LOW (ref 39.0–52.0)
MCV: 83.5 fL (ref 78.0–100.0)
RBC: 3.76 MIL/uL — ABNORMAL LOW (ref 4.22–5.81)
WBC: 11.1 10*3/uL — ABNORMAL HIGH (ref 4.0–10.5)

## 2013-05-15 LAB — BASIC METABOLIC PANEL
BUN: 44 mg/dL — ABNORMAL HIGH (ref 6–23)
Chloride: 108 mEq/L (ref 96–112)
Glucose, Bld: 165 mg/dL — ABNORMAL HIGH (ref 70–99)
Potassium: 4.4 mEq/L (ref 3.5–5.1)

## 2013-05-15 LAB — URINE CULTURE: Colony Count: NO GROWTH

## 2013-05-15 MED ORDER — FUROSEMIDE 10 MG/ML IJ SOLN
40.0000 mg | Freq: Once | INTRAMUSCULAR | Status: AC
  Administered 2013-05-15: 40 mg via INTRAVENOUS
  Filled 2013-05-15: qty 4

## 2013-05-15 MED ORDER — VANCOMYCIN HCL 10 G IV SOLR
1500.0000 mg | INTRAVENOUS | Status: DC
  Administered 2013-05-15 – 2013-05-16 (×2): 1500 mg via INTRAVENOUS
  Filled 2013-05-15 (×4): qty 1500

## 2013-05-15 MED ORDER — AMIODARONE LOAD VIA INFUSION
150.0000 mg | Freq: Once | INTRAVENOUS | Status: AC
  Administered 2013-05-15: 150 mg via INTRAVENOUS
  Filled 2013-05-15: qty 83.34

## 2013-05-15 MED ORDER — PANTOPRAZOLE SODIUM 40 MG PO PACK
40.0000 mg | PACK | Freq: Every day | ORAL | Status: DC
  Administered 2013-05-15: 40 mg
  Filled 2013-05-15 (×3): qty 20

## 2013-05-15 MED ORDER — DOCUSATE SODIUM 50 MG/5ML PO LIQD
100.0000 mg | Freq: Every day | ORAL | Status: DC
  Administered 2013-05-15: 100 mg
  Filled 2013-05-15 (×3): qty 10

## 2013-05-15 MED FILL — Potassium Chloride Inj 2 mEq/ML: INTRAVENOUS | Qty: 40 | Status: AC

## 2013-05-15 MED FILL — Magnesium Sulfate Inj 50%: INTRAMUSCULAR | Qty: 10 | Status: AC

## 2013-05-15 MED FILL — Sodium Chloride IV Soln 0.9%: INTRAVENOUS | Qty: 1000 | Status: AC

## 2013-05-15 MED FILL — Heparin Sodium (Porcine) Inj 1000 Unit/ML: INTRAMUSCULAR | Qty: 30 | Status: AC

## 2013-05-15 NOTE — Progress Notes (Signed)
ANTIBIOTIC CONSULT NOTE - FOLLOW UP  Pharmacy Consult for Vancomycin/Zosyn Indication: pneumonia  No Known Allergies  Patient Measurements: Height: 6\' 1"  (185.4 cm) Weight: 267 lb 10.2 oz (121.4 kg) IBW/kg (Calculated) : 79.9  Vital Signs: Temp: 98.8 F (37.1 C) (07/11 0915) Temp src: Core (Comment) (07/11 0500) BP: 115/59 mmHg (07/11 0729) Pulse Rate: 113 (07/11 0915) Intake/Output from previous day: 07/10 0701 - 07/11 0700 In: 3274.8 [I.V.:2574.8; IV Piggyback:700] Out: 1780 [Urine:1180; Emesis/NG output:390; Chest Tube:210]  Labs:  Recent Labs  05/14/13 0400 05/14/13 1600 05/14/13 1636 05/15/13 0350  WBC 11.1* 10.7*  --  11.1*  HGB 10.5* 10.1* 10.5* 10.0*  PLT 133* 130*  --  122*  CREATININE 2.13* 2.52* 2.50* 2.47*   Estimated Creatinine Clearance: 34.2 ml/min (by C-G formula based on Cr of 2.47). No results found for this basename: VANCOTROUGH, Leodis Binet, VANCORANDOM, GENTTROUGH, GENTPEAK, GENTRANDOM, TOBRATROUGH, TOBRAPEAK, TOBRARND, AMIKACINPEAK, AMIKACINTROU, AMIKACIN,  in the last 72 hours   Microbiology: Recent Results (from the past 720 hour(s))  MRSA PCR SCREENING     Status: None   Collection Time    05/12/13  3:30 PM      Result Value Range Status   MRSA by PCR NEGATIVE  NEGATIVE Final   Comment:            The GeneXpert MRSA Assay (FDA     approved for NASAL specimens     only), is one component of a     comprehensive MRSA colonization     surveillance program. It is not     intended to diagnose MRSA     infection nor to guide or     monitor treatment for     MRSA infections.  SURGICAL PCR SCREEN     Status: Abnormal   Collection Time    05/13/13 12:02 PM      Result Value Range Status   MRSA, PCR NEGATIVE  NEGATIVE Final   Staphylococcus aureus POSITIVE (*) NEGATIVE Final   Comment:            The Xpert SA Assay (FDA     approved for NASAL specimens     in patients over 47 years of age),     is one component of     a comprehensive  surveillance     program.  Test performance has     been validated by [employer] for patients greater     than or equal to 51 year old.     It is not intended     to diagnose infection nor to     guide or monitor treatment.    Anti-infectives   Start     Dose/Rate Route Frequency Ordered Stop   05/15/13 1000  vancomycin (VANCOCIN) 1,500 mg in sodium chloride 0.9 % 250 mL IVPB     1,500 mg 125 mL/hr over 120 Minutes Intravenous Every 24 hours 05/15/13 0916     05/14/13 0600  vancomycin (VANCOCIN) 1,500 mg in sodium chloride 0.9 % 500 mL IVPB  Status:  Discontinued     1,500 mg 250 mL/hr over 120 Minutes Intravenous Every 24 hours 05/13/13 0521 05/13/13 1128   05/14/13 0400  vancomycin (VANCOCIN) 1,250 mg in sodium chloride 0.9 % 250 mL IVPB  Status:  Discontinued     1,250 mg 166.7 mL/hr over 90 Minutes Intravenous To Surgery 05/13/13 1510 05/13/13 1901   05/14/13 0400  cefUROXime (ZINACEF) 1.5 g in dextrose 5 %  50 mL IVPB  Status:  Discontinued     1.5 g 100 mL/hr over 30 Minutes Intravenous To Surgery 05/13/13 1510 05/13/13 1901   05/14/13 0400  cefUROXime (ZINACEF) 750 mg in dextrose 5 % 50 mL IVPB  Status:  Discontinued     750 mg 100 mL/hr over 30 Minutes Intravenous To Surgery 05/13/13 1111 05/13/13 1124   05/14/13 0145  vancomycin (VANCOCIN) IVPB 1000 mg/200 mL premix  Status:  Discontinued     1,000 mg 200 mL/hr over 60 Minutes Intravenous  Once 05/13/13 1932 05/13/13 1955   05/13/13 2145  cefUROXime (ZINACEF) 1.5 g in dextrose 5 % 50 mL IVPB  Status:  Discontinued     1.5 g 100 mL/hr over 30 Minutes Intravenous Every 12 hours 05/13/13 1932 05/13/13 1955   05/13/13 1200  piperacillin-tazobactam (ZOSYN) IVPB 3.375 g     3.375 g 12.5 mL/hr over 240 Minutes Intravenous Every 8 hours 05/13/13 0521     05/13/13 1145  [MAR Hold]  cefUROXime (ZINACEF) 1.5 g in dextrose 5 % 50 mL IVPB     (On MAR Hold since 05/13/13 1306)   1.5 g 100 mL/hr over 30 Minutes Intravenous To  Surgery 05/13/13 1130 05/13/13 1320   05/13/13 1130  cefUROXime (ZINACEF) 750 mg in dextrose 5 % 50 mL IVPB  Status:  Discontinued     750 mg 100 mL/hr over 30 Minutes Intravenous To Surgery 05/13/13 1124 05/13/13 1926   05/13/13 1125  vancomycin (VANCOCIN) 1,500 mg in sodium chloride 0.9 % 500 mL IVPB  Status:  Discontinued     1,500 mg 250 mL/hr over 120 Minutes Intravenous Every 24 hours 05/13/13 1128 05/15/13 0914   05/13/13 0530  vancomycin (VANCOCIN) 2,000 mg in sodium chloride 0.9 % 500 mL IVPB     2,000 mg 250 mL/hr over 120 Minutes Intravenous  Once 05/13/13 0521 05/13/13 0800   05/13/13 0530  piperacillin-tazobactam (ZOSYN) IVPB 3.375 g  Status:  Discontinued     3.375 g 100 mL/hr over 30 Minutes Intravenous  Once 05/13/13 0521 05/13/13 1933      Assessment: 79 y/o M POD #2 CABG. On vancomycin/zosyn for PNA. CXR on 7/11 shows improved pulmonary infiltrates. WBC 11.1<10.7, Tmax 99.9, CKD with SCr 2.47<2.50 with CrCl ~ 30-35.   Goal of Therapy:  Vancomycin trough level 15-20 mcg/ml  Plan:  -Continue Vancomycin 1500mg  IV q24h -Consider Vancomycin Trough Sunday/Monday -Continue Zosyn 3.375G IV q8h to be infused over 4 hours -Trend WBC, temp, renal function -F/U micro data for de-escalation  Thank you for allowing me to take part in this patient's care,  Abran Duke, PharmD Clinical Pharmacist Phone: [phone] Pager: [phone] 05/15/2013 10:08 AM

## 2013-05-15 NOTE — Progress Notes (Signed)
Upon attempt to draw ABG arterial line became positional and both RT and I were unable to reposition and get an accurate measurement. E-MD called and orders received to place new art line.  Pt stable at this time, respiratory therapy advised of urgent need for new arterial line. Current art line removed due to malposition.

## 2013-05-15 NOTE — Progress Notes (Signed)
PULMONARY  / CRITICAL CARE MEDICINE  Name: [name] MRN: [id number] DOB: [birth year]    ADMISSION DATE:  05/12/2013 CONSULTATION DATE:  05/13/2013  REFERRING MD :  Cardiology PRIMARY SERVICE:  Cardiology  CHIEF COMPLAINT:  Acute respiratory failure  BRIEF PATIENT DESCRIPTION: 79 yo with past medical history of RA, LE DVT, AF on Coumadin transferred from [facility] on 7/8 with NSTEMI.  Cardiac cath postponed secondary to coagulopathy / elevated Cr.  On 7/9 developed hypoxemic respiratory failure requiring intubation. Cath w 3 vessel dz, underwent CABG 05/13/13.   SIGNIFICANT EVENTS / STUDIES:  7/8  Transferred from [facility] with NSTEMI 7/8  TTE >>> EF=45-50, regional wall motion abnormalities, diastolic dysfunction 7/9  Intubated for hypoxemic respiratory failure 7/9 L cath >> 3 vessel dz 7/9 CABG (Dr Laneta Simmers)  LINES / TUBES: OETT 7/9 >>> OGT 7/9 >>> Foley 7/9 >>> R IJ cordis 7/9 >>  L radial art 7/9 >>   CULTURES: MRSA screen 7/9 >> positive  ANTIBIOTICS: Zosyn 7/9 >>> Vancomycin 7/9 >>>  INTERVAL HISTORY: Changed to ARDS protocol 7/10 Weaning nitric FiO2 0.50, PEEP 8  VITAL SIGNS: Temp:  [98.4 F (36.9 C)-99.9 F (37.7 C)] 98.8 F (37.1 C) (07/11 0915) Pulse Rate:  [53-134] 113 (07/11 0915) Resp:  [14-29] 28 (07/11 0915) BP: (67-132)/(44-97) 115/59 mmHg (07/11 0729) SpO2:  [88 %-100 %] 96 % (07/11 0915) Arterial Line BP: (65-139)/(36-71) 112/62 mmHg (07/11 0915) FiO2 (%):  [50 %-70 %] 50 % (07/11 0730) Weight:  [121.4 kg (267 lb 10.2 oz)] 121.4 kg (267 lb 10.2 oz) (07/11 0455)  HEMODYNAMICS: PAP: (19-43)/(9-29) 34/22 mmHg CO:  [6.3 L/min] 6.3 L/min CI:  [2.7 L/min/m2] 2.7 L/min/m2 VENTILATOR SETTINGS: Vent Mode:  [-] PRVC FiO2 (%):  [50 %-70 %] 50 % Set Rate:  [20 bmp-26 bmp] 26 bmp Vt Set:  [480 mL-560 mL] 480 mL PEEP:  [8 cmH20-10 cmH20] 8 cmH20 Plateau Pressure:  [17 cmH20-23 cmH20] 17 cmH20  INTAKE / OUTPUT: Intake/Output      07/10 0701 - 07/11 0700 07/11 0701 - 07/12 0700   I.V. (mL/kg) 2574.8 (21.2)    Blood     IV Piggyback 700    Total Intake(mL/kg) 3274.8 (27)    Urine (mL/kg/hr) 1180 (0.4)    Emesis/NG output 390 (0.1)    Chest Tube 210 (0.1)    Total Output 1780     Net +1494.8            PHYSICAL EXAMINATION: General:  Appears acutely ill, mechanically ventilated, synchronous Neuro:  sedated, nonfocal, cough / gag diminished HEENT:  PERRL, OETT / OGT Cardiovascular:  Regular, distant Lungs:  Bilateral diminished air entry, bibasilar rales Abdomen:  Soft, distended, nontender, bowel sounds diminished Musculoskeletal:  Moves all extremities, bilateral pitting edema Skin:  LLE pink scar  LABS:  Recent Labs Lab 05/14/13 0400 05/14/13 1600 05/14/13 1636 05/15/13 0350  HGB 10.5* 10.1* 10.5* 10.0*  HCT 33.6* 31.8* 31.0* 31.4*  WBC 11.1* 10.7*  --  11.1*  PLT 133* 130*  --  122*    Recent Labs Lab 05/13/13 0409  05/13/13 1818 05/13/13 1931 05/14/13 0400 05/14/13 1600 05/14/13 1636 05/15/13 0350  NA 139  < > 139 141 138  --  142 139  K 4.7  < > 4.3 4.1 4.5  --  4.6 4.4  CL 104  --   --   --  108  --  111 108  CO2 24  --   --   --  21  --   --  21  GLUCOSE 148*  < > 102* 105* 136*  --  149* 165*  BUN 42*  --   --   --  42*  --  39* 44*  CREATININE 1.77*  --   --   --  2.13* 2.52* 2.50* 2.47*  CALCIUM 9.2  --   --   --  7.5*  --   --  7.3*  MG 2.7*  --   --   --  2.8* 3.0*  --  2.9*  PHOS  --   --   --   --   --   --   --  4.8*  < > = values in this interval not displayed.  Recent Labs Lab 05/13/13 1937 05/14/13 0802 05/14/13 1239 05/14/13 1413 05/14/13 1636 05/14/13 1639  PHART 7.284* 7.322* 7.317* 7.296*  --  7.327*  PCO2ART 45.7* 39.1 39.5 41.9  --  35.7  PO2ART 69.0* 101.0* 81.0 82.0  --  68.0*  HCO3 21.8 20.1 20.1 20.3  --  18.6*  TCO2 23 21 21 22 19 20   O2SAT 92.0 97.0 94.0 94.0  --  92.0    Recent Labs Lab 05/12/13 2227 05/13/13 0409 05/13/13 1900  05/15/13 0350  INR 2.72* 1.72* 2.71* 2.43*    Recent Labs Lab 05/13/13 0409 05/14/13 1235 05/15/13 0353  PROCALCITON 0.10 14.47 14.37    CXR:  7/11 >>> Hardware in good position, bilateral alveolar airspace disease R>L are improved  ASSESSMENT / PLAN:  PULMONARY A:  Acute hypoxemic / hypercarbic respiratory failure.  Acute cardiogenic pulmonary edema vs aspiration pneumonia (asymmettric R sided airspace disease, distended abdomen, encephalopathy). Suspect pulm edema given rapid improvement P:   Lung protective strategy given the potential here for ARDS (suspect that this is cardiogenic edema) wean FiO2 and PEEP as tolerated Wean nitric oxide to off  CARDIOVASCULAR A: NSTEMI.   AF/RVR, improved after intubated.   Acute on chronic systolic and diastolic CHF.   Hypotension. P:  ASA, Lipitor Holding beta-blocker and ACEI due to hypotension Dopamine + norepi, wean as able.  Milrinone to off Amiodarone  Anticoagulation as below  RENAL A:  Acute on Chronic renal failure.  Fluid overload. S Cr stable around 2.5 as of 7/11 P:   Goal K>4, Mg>2 Trend BMP Lasix held, consider restart when BP and S Cr stabilize  GASTROINTESTINAL A:  Nonspecific abdominal distension. P:   OGT to Sx Protonix for GI Px  HEMATOLOGIC A:  Coumadin induced coagulopathy. P:  Trend CBC Trend APTT / INR  INFECTIOUS A:  Possible aspiration pneumonia / HCAP. Pct supports infectious process  P:   vanco + zosyn 7/9 >> narrow based on cx's and clinical course  ENDOCRINE  A:  Hyperglycemia.  No h/o DM.   P:   SSI  NEUROLOGIC A:  Acute encephalopathy. P:   Goal RASS 0 to -1 Fentanyl gtt precedex   I have personally obtained a history, examined the patient, evaluated laboratory and imaging results, formulated the assessment and plan and placed orders.  CRITICAL CARE:  The patient is critically ill with multiple organ systems failure and requires high complexity decision making for  assessment and support, frequent evaluation and titration of therapies, application of advanced monitoring technologies and extensive interpretation of multiple databases. Critical Care Time devoted to patient care services described in this note is 45 minutes.   Levy Pupa, MD, PhD 05/15/2013, 9:38 AM [facility] [phone] or  if no answer [phone]

## 2013-05-15 NOTE — Progress Notes (Signed)
Pt converted to NSR

## 2013-05-15 NOTE — Progress Notes (Signed)
TCTS BRIEF SICU PROGRESS NOTE  2 Days Post-Op  S/P Procedure(s) (LRB): CORONARY ARTERY BYPASS GRAFTING (CABG) (N/A)   Stable day Maintaining NSR w/ stable hemodynamics now on dopamine@5  and very low dose levophed UOP 35-40 mL/hr  Plan: Continue current plan.  Now that BP improved will give lasix  OWEN,CLARENCE H 05/15/2013 6:55 PM

## 2013-05-15 NOTE — Progress Notes (Addendum)
TCTS DAILY ICU PROGRESS NOTE                   [address]            [name] [address]          [phone]   2 Days Post-Op Procedure(s) (LRB): CORONARY ARTERY BYPASS GRAFTING (CABG) (N/A)  Total Length of Stay:  LOS: 3 days   Subjective: IN afib with RVR,  On amio Gtt, weaning FiO2, milrinone is off   Objective: Vital signs in last 24 hours: Temp:  [98.4 F (36.9 C)-99.9 F (37.7 C)] 98.6 F (37 C) (07/11 0700) Pulse Rate:  [53-134] 122 (07/11 0700) Cardiac Rhythm:  [-] Atrial fibrillation (07/11 0500) Resp:  [14-28] 28 (07/11 0700) BP: (67-132)/(44-97) 132/70 mmHg (07/11 0404) SpO2:  [88 %-100 %] 98 % (07/11 0700) Arterial Line BP: (65-139)/(36-71) 108/61 mmHg (07/11 0700) FiO2 (%):  [50 %-70 %] 60 % (07/11 0404) Weight:  [267 lb 10.2 oz (121.4 kg)] 267 lb 10.2 oz (121.4 kg) (07/11 0455)  Filed Weights   05/12/13 1411 05/14/13 0500 05/15/13 0455  Weight: 243 lb 9.7 oz (110.5 kg) 267 lb 3.2 oz (121.2 kg) 267 lb 10.2 oz (121.4 kg)    Weight change: 7.1 oz (0.2 kg)   Hemodynamic parameters for last 24 hours: PAP: (19-43)/(9-29) 31/22 mmHg CO:  [6.3 L/min] 6.3 L/min CI:  [2.7 L/min/m2] 2.7 L/min/m2  Intake/Output from previous day: 07/10 0701 - 07/11 0700 In: 3274.8 [I.V.:2574.8; IV Piggyback:700] Out: 1780 [Urine:1180; Emesis/NG output:390; Chest Tube:210]  Vent Mode:  [-] PRVC FiO2 (%):  [50 %-70 %] 60 % Set Rate:  [16 bmp-26 bmp] 26 bmp Vt Set:  [480 mL-650 mL] 480 mL PEEP:  [8 cmH20-10 cmH20] 10 cmH20 Pressure Support:  [10 cmH20] 10 cmH20 Plateau Pressure:  [18 cmH20-23 cmH20] 20 cmH20  Intake/Output this shift:    Current Meds: Scheduled Meds: . acetaminophen  1,000 mg Oral Q6H   Or  . acetaminophen (TYLENOL) oral liquid 160 mg/5 mL  975 mg Per Tube Q6H  . ipratropium  0.5 mg Nebulization Q4H   And  . albuterol  2.5 mg Nebulization Q4H  . aspirin EC  325 mg Oral Daily   Or  . aspirin  324 mg Per Tube Daily  . atorvastatin   80 mg Oral q1800  . bisacodyl  10 mg Oral Daily   Or  . bisacodyl  10 mg Rectal Daily  . chlorhexidine  15 mL Mouth/Throat BID  . docusate sodium  200 mg Oral Daily  . enoxaparin (LOVENOX) injection  30 mg Subcutaneous Q24H  . insulin aspart  0-24 Units Subcutaneous Q4H  . metoprolol tartrate  12.5 mg Oral BID   Or  . metoprolol tartrate  12.5 mg Per Tube BID  . pantoprazole  40 mg Oral Daily  . piperacillin-tazobactam (ZOSYN)  IV  3.375 g Intravenous Q8H  . sodium chloride  3 mL Intravenous Q12H  . vancomycin  1,500 mg Intravenous Q24H   Continuous Infusions: . sodium chloride 20 mL/hr at 05/14/13 1900  . sodium chloride 20 mL/hr at 05/14/13 1900  . sodium chloride    . amiodarone (NEXTERONE PREMIX) 360 mg/200 mL dextrose 30 mg/hr (05/15/13 0500)  . dexmedetomidine 1.2 mcg/kg/hr (05/15/13 0456)  . DOPamine 5 mcg/kg/min (05/15/13 0500)  . lactated ringers    . milrinone Stopped (05/14/13 2025)  . nitroGLYCERIN Stopped (05/13/13 2105)  . norepinephrine (LEVOPHED) Adult infusion 5 mcg/min (05/15/13  Leslie.Mor)  . phenylephrine (NEO-SYNEPHRINE) Adult infusion Stopped (05/14/13 0500)   PRN Meds:.metoprolol, midazolam, morphine injection, ondansetron (ZOFRAN) IV, oxyCODONE, sodium chloride  General appearance: sedated, but moves ext, nods appropriatly Neurologic: grossly intact Heart: irregularly irregular rhythm Lungs: clear anteriorly Abdomen: soft, nontender, mild distension Extremities: + edema, mild Wound: dressings CDI  Lab Results: CBC: Recent Labs  05/14/13 1600 05/14/13 1636 05/15/13 0350  WBC 10.7*  --  11.1*  HGB 10.1* 10.5* 10.0*  HCT 31.8* 31.0* 31.4*  PLT 130*  --  122*   BMET:  Recent Labs  05/14/13 0400  05/14/13 1636 05/15/13 0350  NA 138  --  142 139  K 4.5  --  4.6 4.4  CL 108  --  111 108  CO2 21  --   --  21  GLUCOSE 136*  --  149* 165*  BUN 42*  --  39* 44*  CREATININE 2.13*  < > 2.50* 2.47*  CALCIUM 7.5*  --   --  7.3*  < > = values in this  interval not displayed.  PT/INR:  Recent Labs  05/15/13 0350  LABPROT 25.6*  INR 2.43*    ABG    Component Value Date/Time   PHART 7.327* 05/14/2013 1639   PCO2ART 35.7 05/14/2013 1639   PO2ART 68.0* 05/14/2013 1639   HCO3 18.6* 05/14/2013 1639   TCO2 20 05/14/2013 1639   ACIDBASEDEF 7.0* 05/14/2013 1639   O2SAT 92.0 05/14/2013 1639       Radiology: Dg Chest Portable 1 View In Am  05/14/2013   *RADIOLOGY REPORT*  Clinical Data: Postop CABG  PORTABLE CHEST - 1 VIEW  Comparison: Prior chest x-ray 05/13/2013  Findings: Endotracheal tube is 5 cm above the carina.  Stable position of right IJ vascular sheath can vein a Swan-Ganz catheter and heart.  The tip of the Ernestine Conrad is pulled back slightly and now overlies the main pulmonary outflow tract.  Mediastinal drain and left thoracostomy tube in similar position.  The tip of the nasogastric tube is not visualized but lies below the diaphragm, presumably within the stomach.  Stable cardiomegaly and bilateral pleural effusions.  Dense interstitial and airspace opacities throughout the right lung is similar compared to prior.  There may be mild clearing in the upper lung field.  IMPRESSION:  1.  Slightly improved aeration in the right upper lung, otherwise similar dense interstitial and airspace opacities.  Differential considerations include asymmetric pulmonary edema, aspiration and potentially alveolar pulmonary hemorrhage. 2.  The tip of the Swan-Ganz catheter has retracted slightly and now projects over the main pulmonary outflow tract rather than the right pulmonary artery. 3.  Other support apparatus in stable and satisfactory position.   Original Report Authenticated By: Malachy Moan, M.D.   Dg Chest Portable 1 View  05/13/2013   *RADIOLOGY REPORT*  Clinical Data: Post cardiac surgery  PORTABLE CHEST - 1 VIEW  Comparison: Portable exam 1948 hours compared to 1156 hrs  Findings: Endotracheal tube, nasogastric tube, and mediastinal drains stable.  New right jugular Swan-Ganz catheter with tip projecting over right pulmonary artery. Enlargement of cardiac silhouette with pulmonary vascular congestion postsurgical changes of CABG. Bilateral airspace infiltrates right greater than left slightly increased since previous exam, likely asymmetric edema. Increased left basilar atelectasis and pleural effusion. No pneumothorax.  IMPRESSION: No pneumothorax following Swan-Ganz catheter placement. Worsened aeration since earlier exam.   Original Report Authenticated By: Ulyses Southward, M.D.   Dg Chest Port 1 View  05/13/2013   *RADIOLOGY REPORT*  Clinical Data: Acute pulmonary edema, follow-up  PORTABLE CHEST - 1 VIEW  Comparison: Portable chest x-ray of 05/13/2013  Findings: The airspace disease is predominately throughout the right mid and lower lung field.  Although this could represent residual asymmetrical edema, pneumonia is also a consideration in view of the asymmetry.  The endotracheal tip is approximately 6.1 cm above the carina.  Cardiomegaly is stable.  IMPRESSION: Airspace disease throughout the right mid and lower lung field may represent residual asymmetrical edema, but pneumonia is a definite consideration.   Original Report Authenticated By: Dwyane Dee, M.D.     Assessment/Plan: S/P Procedure(s) (LRB): CORONARY ARTERY BYPASS GRAFTING (CABG) (N/A)  1 making progress 2 wean vent per CCM management 3 give additional amio bolus now 4 wean dop/levophed as able per hemodynamics 5 ARI, monitor, UO adequate , diurese when off pressors 6 INR elevated, no sig bleeding, H/H stable  leave CT's in place. Was on coumadin preop 7 CXR improved vasc congestion  GOLD,WAYNE E 05/15/2013 7:27 AM  Chart reviewed, patient examined, agree with above. His CXR looks better today.

## 2013-05-16 ENCOUNTER — Inpatient Hospital Stay (HOSPITAL_COMMUNITY): Payer: Medicare Other

## 2013-05-16 DIAGNOSIS — J9600 Acute respiratory failure, unspecified whether with hypoxia or hypercapnia: Secondary | ICD-10-CM

## 2013-05-16 LAB — MAGNESIUM: Magnesium: 2.5 mg/dL (ref 1.5–2.5)

## 2013-05-16 LAB — PROCALCITONIN: Procalcitonin: 8.07 ng/mL

## 2013-05-16 LAB — CBC
HCT: 30.5 % — ABNORMAL LOW (ref 39.0–52.0)
Platelets: 106 10*3/uL — ABNORMAL LOW (ref 150–400)
RDW: 16.8 % — ABNORMAL HIGH (ref 11.5–15.5)
WBC: 8.6 10*3/uL (ref 4.0–10.5)

## 2013-05-16 LAB — GLUCOSE, CAPILLARY: Glucose-Capillary: 110 mg/dL — ABNORMAL HIGH (ref 70–99)

## 2013-05-16 LAB — PHOSPHORUS: Phosphorus: 3.9 mg/dL (ref 2.3–4.6)

## 2013-05-16 LAB — BASIC METABOLIC PANEL
GFR calc non Af Amer: 26 mL/min — ABNORMAL LOW (ref >90)
Glucose, Bld: 128 mg/dL — ABNORMAL HIGH (ref 70–99)
Potassium: 3.7 mEq/L (ref 3.5–5.1)
Sodium: 138 mEq/L (ref 135–145)

## 2013-05-16 MED ORDER — KETOROLAC TROMETHAMINE 30 MG/ML IJ SOLN
INTRAMUSCULAR | Status: AC
  Filled 2013-05-16: qty 1

## 2013-05-16 MED ORDER — MORPHINE SULFATE 2 MG/ML IJ SOLN
2.0000 mg | INTRAMUSCULAR | Status: DC | PRN
  Administered 2013-05-16 – 2013-05-18 (×16): 2 mg via INTRAVENOUS
  Filled 2013-05-16 (×17): qty 1

## 2013-05-16 MED ORDER — DOCUSATE SODIUM 100 MG PO CAPS
100.0000 mg | ORAL_CAPSULE | Freq: Every day | ORAL | Status: DC
  Administered 2013-05-16 – 2013-05-17 (×2): 100 mg via ORAL
  Filled 2013-05-16 (×2): qty 2

## 2013-05-16 MED ORDER — INSULIN ASPART 100 UNIT/ML ~~LOC~~ SOLN: 0.0000 [IU] | SUBCUTANEOUS | Status: DC

## 2013-05-16 MED ORDER — ALBUTEROL SULFATE (5 MG/ML) 0.5% IN NEBU
2.5000 mg | INHALATION_SOLUTION | Freq: Four times a day (QID) | RESPIRATORY_TRACT | Status: DC
  Administered 2013-05-16 – 2013-05-27 (×43): 2.5 mg via RESPIRATORY_TRACT
  Filled 2013-05-16 (×44): qty 0.5

## 2013-05-16 MED ORDER — BIOTENE DRY MOUTH MT LIQD
15.0000 mL | Freq: Two times a day (BID) | OROMUCOSAL | Status: DC
  Administered 2013-05-16 – 2013-05-25 (×18): 15 mL via OROMUCOSAL

## 2013-05-16 MED ORDER — PANTOPRAZOLE SODIUM 40 MG PO TBEC
40.0000 mg | DELAYED_RELEASE_TABLET | Freq: Every day | ORAL | Status: DC
  Administered 2013-05-16 – 2013-05-17 (×2): 40 mg via ORAL
  Filled 2013-05-16 (×2): qty 1

## 2013-05-16 MED ORDER — FUROSEMIDE 10 MG/ML IJ SOLN
40.0000 mg | Freq: Four times a day (QID) | INTRAMUSCULAR | Status: AC
  Administered 2013-05-16 (×3): 40 mg via INTRAVENOUS
  Filled 2013-05-16: qty 4

## 2013-05-16 MED ORDER — IPRATROPIUM BROMIDE 0.02 % IN SOLN
0.5000 mg | Freq: Four times a day (QID) | RESPIRATORY_TRACT | Status: DC
  Administered 2013-05-16 – 2013-05-27 (×43): 0.5 mg via RESPIRATORY_TRACT
  Filled 2013-05-16 (×44): qty 2.5

## 2013-05-16 MED ORDER — POTASSIUM CHLORIDE 10 MEQ/50ML IV SOLN
10.0000 meq | INTRAVENOUS | Status: AC
  Administered 2013-05-16 (×3): 10 meq via INTRAVENOUS

## 2013-05-16 MED ORDER — KETOROLAC TROMETHAMINE 30 MG/ML IJ SOLN
30.0000 mg | Freq: Once | INTRAMUSCULAR | Status: AC
  Administered 2013-05-16: 30 mg via INTRAVENOUS

## 2013-05-16 NOTE — Procedures (Signed)
Extubation Procedure Note  Patient Details:   Name: [name] DOB: 11-26-1933 MRN: [id number]   Airway Documentation:     Evaluation  O2 sats: stable throughout Complications: No apparent complications Patient did tolerate procedure well. Bilateral Breath Sounds: Diminished Suctioning: Airway Yes  Pt tolerated wean, order to extubate, positive for cuff leak. Pt extubated to 5lpm [place], no dyspnea or stridor noted after extubation. Poor effort with IS. All vitals are within normal limits at this time. RT will continue to monitor.   Beatris Si 05/16/2013, 8:34 AM

## 2013-05-16 NOTE — Progress Notes (Signed)
TCTS BRIEF SICU PROGRESS NOTE  3 Days Post-Op  S/P Procedure(s) (LRB): CORONARY ARTERY BYPASS GRAFTING (CABG) (N/A)   Stable day NSR w/ stable BP O2 sats 95% on 5 L/min Diuresing well  Plan: Continue current plan  OWEN,CLARENCE H 05/16/2013 6:04 PM

## 2013-05-16 NOTE — Progress Notes (Signed)
      [address]       [name] [address]             [phone]        CARDIOTHORACIC SURGERY PROGRESS NOTE   R3 Days Post-Op Procedure(s) (LRB): CORONARY ARTERY BYPASS GRAFTING (CABG) (N/A)  Subjective: Just extubated 1 hour ago.  Awake and alert, c/o pain across chest.  Insisting upon going home today.  Breathing comfortably.  Objective: Vital signs: BP Readings from Last 1 Encounters:  05/16/13 115/62   Pulse Readings from Last 1 Encounters:  05/16/13 75   Resp Readings from Last 1 Encounters:  05/16/13 18   Temp Readings from Last 1 Encounters:  05/16/13 99.7 F (37.6 C)     Hemodynamics: PAP: (31-51)/(19-34) 51/28 mmHg CO:  [3.5 L/min-4 L/min] 3.9 L/min CI:  [1.5 L/min/m2-1.7 L/min/m2] 1.7 L/min/m2  Physical Exam:  Rhythm:   In and out of Afib overnight, currently NSR  Breath sounds: Fairly clear  Heart sounds:  RRR  Incisions:  Clean and dry  Abdomen:  Soft, non-distended, non-tender  Extremities:  Warm, well-perfused    Intake/Output from previous day: 07/11 0701 - 07/12 0700 In: 3287.6 [I.V.:2587.6; NG/GT:300; IV Piggyback:400] Out: 2745 [Urine:2345; Emesis/NG output:300; Chest Tube:100] Intake/Output this shift: Total I/O In: 244.3 [I.V.:244.3] Out: 225 [Urine:185; Chest Tube:40]  Lab Results:  Recent Labs  05/15/13 0350 05/16/13 0400  WBC 11.1* 8.6  HGB 10.0* 9.7*  HCT 31.4* 30.5*  PLT 122* 106*   BMET:  Recent Labs  05/15/13 0350 05/16/13 0400  NA 139 138  K 4.4 3.7  CL 108 107  CO2 21 22  GLUCOSE 165* 128*  BUN 44* 43*  CREATININE 2.47* 2.27*  CALCIUM 7.3* 6.9*    CBG (last 3)   Recent Labs  05/15/13 2318 05/16/13 0329 05/16/13 0718  GLUCAP 107* 106* 107*   ABG    Component Value Date/Time   PHART 7.305* 05/15/2013 1713   HCO3 15.6* 05/15/2013 1713   TCO2 17 05/15/2013 1713   ACIDBASEDEF 10.0* 05/15/2013 1713   O2SAT 89.0 05/15/2013 1713   CXR: *RADIOLOGY REPORT*  Clinical Data: Status post  CABG.  PORTABLE CHEST - 1 VIEW  Comparison: 05/15/2013  Findings: Endotracheal tube remains with the tip approximately 3.5  cm above the carina. Swan-Ganz catheter tip is in the proximal  right pulmonary artery. No pneumothorax is identified. The lung  volumes are reduced with persistent bilateral lower lobe  atelectasis present. Suggestion of mild interstitial edema. No  large pleural effusions are identified. Heart size and mediastinal  contours are stable.  IMPRESSION:  Reduced lung volumes bilaterally and bilateral lower lobe  atelectasis. No pneumothorax. Suggestion of mild residual  interstitial edema.  Original Report Authenticated By: Irish Lack, M.D.    Assessment/Plan: S/P Procedure(s) (LRB): CORONARY ARTERY BYPASS GRAFTING (CABG) (N/A)  Overall stable POD3 Currently maintaining NSR on IV amiodarone Hemodynamics stable Acute on chronic renal insufficiency, creatinine stable Expected post op volume excess, with preop acute pulm edema, diuresing fairly well Expected post op acute blood loss anemia, mild, stable Obesity   Mobilize  D/C tubes and lines  Diuresis  Continue IV amiodarone    Aryn Safran H 05/16/2013 9:39 AM

## 2013-05-16 NOTE — Progress Notes (Addendum)
PULMONARY  / CRITICAL CARE MEDICINE  Name: [name] MRN: [id number] DOB: [birth year]    ADMISSION DATE:  05/12/2013 CONSULTATION DATE:  05/13/2013  REFERRING MD :  Cardiology PRIMARY SERVICE:  Cardiology  CHIEF COMPLAINT:  Acute respiratory failure  BRIEF PATIENT DESCRIPTION: 79 yo with past medical history of RA, LE DVT, AF on Coumadin transferred from [facility] on 7/8 with NSTEMI.  Cardiac cath postponed secondary to coagulopathy / elevated Cr.  On 7/9 developed hypoxemic respiratory failure requiring intubation. Cath w 3 vessel dz, underwent CABG 05/13/13.   SIGNIFICANT EVENTS / STUDIES:  7/8  Transferred from [facility] with NSTEMI 7/8  TTE >>> EF=45-50, regional wall motion abnormalities, diastolic dysfunction 7/9  Intubated for hypoxemic respiratory failure 7/9 L cath >> 3 vessel dz 7/9 CABG (Dr Laneta Simmers)  LINES / TUBES: OETT 7/9 >>> OGT 7/9 >>> Foley 7/9 >>> R IJ cordis 7/9 >>  L radial art 7/9 >>   CULTURES: MRSA screen 7/9 >> positive Urine 7/10 >> negative  ANTIBIOTICS: Zosyn 7/9 >>> Vancomycin 7/9 >>>  INTERVAL HISTORY: Tolerating pressure support.  VITAL SIGNS: Temp:  [98.4 F (36.9 C)-101.1 F (38.4 C)] 100 F (37.8 C) (07/12 0700) Pulse Rate:  [71-123] 73 (07/12 0700) Resp:  [23-29] 26 (07/12 0700) BP: (97-144)/(51-74) 137/74 mmHg (07/12 0700) SpO2:  [87 %-100 %] 97 % (07/12 0700) Arterial Line BP: (89-162)/(48-76) 162/76 mmHg (07/12 0700) FiO2 (%):  [40 %-50 %] 50 % (07/12 0400)  HEMODYNAMICS: PAP: (29-51)/(19-34) 51/34 mmHg CO:  [3.5 L/min-4 L/min] 3.7 L/min CI:  [1.5 L/min/m2-1.7 L/min/m2] 1.6 L/min/m2 VENTILATOR SETTINGS: Vent Mode:  [-] PRVC FiO2 (%):  [40 %-50 %] 50 % Set Rate:  [26 bmp] 26 bmp Vt Set:  [480 mL] 480 mL PEEP:  [5 cmH20-8 cmH20] 5 cmH20 Plateau Pressure:  [17 cmH20-20 cmH20] 20 cmH20  INTAKE / OUTPUT: Intake/Output     07/11 0701 - 07/12 0700 07/12 0701 - 07/13 0700   I.V. (mL/kg) 2587.6 (21.3)    NG/GT  300    IV Piggyback 400    Total Intake(mL/kg) 3287.6 (27.1)    Urine (mL/kg/hr) 2345 (0.8)    Emesis/NG output 300 (0.1)    Chest Tube 100 (0)    Total Output 2745     Net +542.6            PHYSICAL EXAMINATION: General: No distress Neuro: RASS -1, moves extremities, follows commands HEENT: ETT in place Cardiovascular:  Regular, no murmur, anterior chest tubes in place Lungs:  Basilar rales Abdomen:  Soft, non tender Musculoskeletal: cool, no edema Skin: no rashes  LABS: CBC Recent Labs     05/14/13  1600  05/14/13  1636  05/15/13  0350  05/16/13  0400  WBC  10.7*   --   11.1*  8.6  HGB  10.1*  10.5*  10.0*  9.7*  HCT  31.8*  31.0*  31.4*  30.5*  PLT  130*   --   122*  106*    Coag's Recent Labs     05/13/13  1900  05/15/13  0350  APTT  47*   --   INR  2.71*  2.43*    BMET Recent Labs     05/14/13  0400   05/14/13  1636  05/15/13  0350  05/16/13  0400  NA  138   --   142  139  138  K  4.5   --   4.6  4.4  3.7  CL  108   --   111  108  107  CO2  21   --    --   21  22  BUN  42*   --   39*  44*  43*  CREATININE  2.13*   < >  2.50*  2.47*  2.27*  GLUCOSE  136*   --   149*  165*  128*   < > = values in this interval not displayed.    Electrolytes Recent Labs     05/14/13  0400  05/14/13  1600  05/15/13  0350  05/16/13  0400  CALCIUM  7.5*   --   7.3*  6.9*  MG  2.8*  3.0*  2.9*  2.5  PHOS   --    --   4.8*  3.9    Sepsis Markers Recent Labs     05/14/13  1235  05/15/13  0353  05/16/13  0400  PROCALCITON  14.47  14.37  8.07    ABG Recent Labs     05/14/13  1413  05/14/13  1639  05/15/13  1713  PHART  7.296*  7.327*  7.305*  PCO2ART  41.9  35.7  31.6*  PO2ART  82.0  68.0*  63.0*    Glucose Recent Labs     05/15/13  0750  05/15/13  1210  05/15/13  1539  05/15/13  1938  05/15/13  2318  05/16/13  0329  GLUCAP  118*  116*  117*  114*  107*  106*    Imaging Dg Chest Port 1 View  05/16/2013   *RADIOLOGY REPORT*  Clinical  Data: Status post CABG.  PORTABLE CHEST - 1 VIEW  Comparison: 05/15/2013  Findings: Endotracheal tube remains with the tip approximately 3.5 cm above the carina.  Swan-Ganz catheter tip is in the proximal right pulmonary artery.  No pneumothorax is identified.  The lung volumes are reduced with persistent bilateral lower lobe atelectasis present.  Suggestion of mild interstitial edema.  No large pleural effusions are identified.  Heart size and mediastinal contours are stable.  IMPRESSION: Reduced lung volumes bilaterally and bilateral lower lobe atelectasis.  No pneumothorax.  Suggestion of mild residual interstitial edema.   Original Report Authenticated By: Irish Lack, M.D.   Dg Chest Port 1 View  05/15/2013   *RADIOLOGY REPORT*  Clinical Data: ARDS, coronary disease post MI and CABG  PORTABLE CHEST - 1 VIEW  Comparison: Portable exam 0653 hours compared to 05/14/2013  Findings: Tip of endotracheal tube projects 5.4 cm above carina. Nasogastric tube extends into the abdomen. Mediastinal drain again noted. Right jugular Swan-Ganz catheter tip projects over proximal right pulmonary artery near bifurcation of main pulmonary artery. Enlargement of cardiac silhouette post median sternotomy and CABG. Improved pulmonary infiltrates. Probable bibasilar effusions. No pneumothorax.  IMPRESSION: Improved pulmonary infiltrates.   Original Report Authenticated By: Ulyses Southward, M.D.       ASSESSMENT / PLAN:  PULMONARY A:  Acute hypoxemic / hypercarbic respiratory failure.  Acute cardiogenic pulmonary edema vs aspiration pneumonia (asymmettric R sided airspace disease, distended abdomen, encephalopathy). Suspect pulm edema given rapid improvement. P:   Pressure support wean as tolerated >> may be ready for extubation soon F/u CXR, ABG Change BD's to q6h  CARDIOVASCULAR A: NSTEMI.   AF/RVR, improved after intubated.   Acute on chronic systolic and diastolic CHF.   Hypotension. P:  Per TCTS Chest  tubes, PA cath per TCTS  RENAL A:  Acute on Chronic  renal failure.  Fluid overload. P:   Negative fluid balance as tolerated  GASTROINTESTINAL A: Nutrition. P:   Per TCTS  HEMATOLOGIC A: Anemia, thrombocytopenia. P:  F/u CBC  INFECTIOUS A:  Possible aspiration pneumonia / HCAP. Pct supports infectious process  P:   vanco + zosyn 7/9 >> narrow based on cx's and clinical course  ENDOCRINE  A:  Hyperglycemia.  No h/o DM.   P:   SSI  NEUROLOGIC A:  Acute encephalopathy. P:   Goal RASS 0 to -1 Fentanyl gtt precedex   CC time 35 minutes.  Coralyn Helling, MD [facility] Pulmonary/Critical Care 05/16/2013, 8:13 AM Pager:  [phone] After 3pm call: [phone]   Did well with SBT.  Good cuff leak.  Maintained SpO2 with decrease in PEEP/FiO2.  Extubated.  Coralyn Helling, MD [facility] Pulmonary/Critical Care 05/16/2013, 8:30 AM Pager:  [phone] After 3pm call: [phone]

## 2013-05-17 ENCOUNTER — Inpatient Hospital Stay (HOSPITAL_COMMUNITY): Payer: Medicare Other

## 2013-05-17 LAB — CBC
HCT: 28.2 % — ABNORMAL LOW (ref 39.0–52.0)
MCHC: 32.3 g/dL (ref 30.0–36.0)
MCV: 81.7 fL (ref 78.0–100.0)
RDW: 17.1 % — ABNORMAL HIGH (ref 11.5–15.5)

## 2013-05-17 LAB — GLUCOSE, CAPILLARY
Glucose-Capillary: 101 mg/dL — ABNORMAL HIGH (ref 70–99)
Glucose-Capillary: 105 mg/dL — ABNORMAL HIGH (ref 70–99)

## 2013-05-17 LAB — BASIC METABOLIC PANEL
BUN: 52 mg/dL — ABNORMAL HIGH (ref 6–23)
CO2: 21 mEq/L (ref 19–32)
Chloride: 106 mEq/L (ref 96–112)
Creatinine, Ser: 2.67 mg/dL — ABNORMAL HIGH (ref 0.50–1.35)
Glucose, Bld: 105 mg/dL — ABNORMAL HIGH (ref 70–99)

## 2013-05-17 MED ORDER — SODIUM CHLORIDE 0.9 % IV SOLN
1500.0000 mg | INTRAVENOUS | Status: DC
  Administered 2013-05-18: 1500 mg via INTRAVENOUS
  Filled 2013-05-17: qty 1500

## 2013-05-17 MED ORDER — METOCLOPRAMIDE HCL 5 MG/ML IJ SOLN
5.0000 mg | Freq: Four times a day (QID) | INTRAMUSCULAR | Status: DC
  Administered 2013-05-17 – 2013-05-18 (×4): 5 mg via INTRAVENOUS
  Filled 2013-05-17 (×6): qty 1

## 2013-05-17 NOTE — Progress Notes (Signed)
Patient ambulated in hallway approx. 150 ft this shift without difficulty, MAE, and turns self in bed well.  Yet, patient gives very little effort with TCDB and IS use. He refuses to perform IS on his own, and when instructed to do so, gives very little to no effort with 350 on IS achieved per RT.  Xray of Abdomen performed as ordered at bedside to assess complaints of abdominal discomfort.

## 2013-05-17 NOTE — Progress Notes (Signed)
Reported off to oncoming shift RN. Safety maintained. No acute distress noted.  

## 2013-05-17 NOTE — Progress Notes (Signed)
TCTS BRIEF SICU PROGRESS NOTE  4 Days Post-Op  S/P Procedure(s) (LRB): CORONARY ARTERY BYPASS GRAFTING (CABG) (N/A)   Overall stable day Still complaining about pain all over Ambulated some NSR w/ stable BP Abdominal exam unchanged UOP adequate  Plan: Continue current plan  Purcell Nails 05/17/2013 6:31 PM

## 2013-05-17 NOTE — Progress Notes (Signed)
Pt still complaining of pain in chest and stomach. When asked what type of pain he's feeling in his chest, if its soreness or pain, he states that "it just hurts". Attempted IS with pt, only achieving at most around 312mLx1 with very poor effort. SpO2 is staying in low 90's on 4lpm [place], at most is 95%. RT and RN both encouraging pt to cough/deep breathe and work with IS. RT will continue to monitor.

## 2013-05-17 NOTE — Progress Notes (Addendum)
CRITICAL VALUE ALERT  Critical value received:  vanc level 26.1  Date of notification:  05/17/2013  Time of notification:  1045  Critical value read back:yes  Nurse who received alert:  Atha Starks RN  MD notified (1st page): Dr. Cornelius Moras. Pharmacy notified of critical value.  Time of first page: 1055  MD notified (2nd page):  Time of second page:  Responding MD:  Dr. Cornelius Moras  Time MD responded 1100

## 2013-05-17 NOTE — Progress Notes (Signed)
ANTIBIOTIC CONSULT NOTE - FOLLOW UP  Pharmacy Consult for Vanco/Zosyn Indication: pneumonia  No Known Allergies  Patient Measurements: Height: 6\' 1"  (185.4 cm) Weight: 267 lb 3.2 oz (121.2 kg) IBW/kg (Calculated) : 79.9 Adjusted Body Weight:   Vital Signs: Temp: 97.4 F (36.3 C) (07/13 0724) Temp src: Oral (07/13 0724) BP: 145/68 mmHg (07/13 1000) Pulse Rate: 67 (07/13 1000) Intake/Output from previous day: 07/12 0701 - 07/13 0700 In: 1793.1 [P.O.:480; I.V.:763.1; IV Piggyback:550] Out: 1815 [Urine:1745; Chest Tube:70] Intake/Output from this shift: Total I/O In: 133.4 [P.O.:60; I.V.:73.4] Out: 30 [Urine:30]  Labs:  Recent Labs  05/15/13 0350 05/16/13 0400 05/17/13 0350  WBC 11.1* 8.6 9.1  HGB 10.0* 9.7* 9.1*  PLT 122* 106* 114*  CREATININE 2.47* 2.27* 2.67*   Estimated Creatinine Clearance: 31.6 ml/min (by C-G formula based on Cr of 2.67).  Recent Labs  05/17/13 0925  VANCOTROUGH 26.1*     Microbiology: Recent Results (from the past 720 hour(s))  MRSA PCR SCREENING     Status: None   Collection Time    05/12/13  3:30 PM      Result Value Range Status   MRSA by PCR NEGATIVE  NEGATIVE Final   Comment:            The GeneXpert MRSA Assay (FDA     approved for NASAL specimens     only), is one component of a     comprehensive MRSA colonization     surveillance program. It is not     intended to diagnose MRSA     infection nor to guide or     monitor treatment for     MRSA infections.  SURGICAL PCR SCREEN     Status: Abnormal   Collection Time    05/13/13 12:02 PM      Result Value Range Status   MRSA, PCR NEGATIVE  NEGATIVE Final   Staphylococcus aureus POSITIVE (*) NEGATIVE Final   Comment:            The Xpert SA Assay (FDA     approved for NASAL specimens     in patients over 53 years of age),     is one component of     a comprehensive surveillance     program.  Test performance has     been validated by [employer] for patients  greater     than or equal to 21 year old.     It is not intended     to diagnose infection nor to     guide or monitor treatment.  URINE CULTURE     Status: None   Collection Time    05/14/13  6:12 PM      Result Value Range Status   Specimen Description URINE, CATHETERIZED   Final   Special Requests NONE   Final   Culture  Setup Time 05/15/2013 00:42   Final   Colony Count NO GROWTH   Final   Culture NO GROWTH   Final   Report Status 05/15/2013 FINAL   Final    Anti-infectives   Start     Dose/Rate Route Frequency Ordered Stop   05/15/13 1000  vancomycin (VANCOCIN) 1,500 mg in sodium chloride 0.9 % 250 mL IVPB  Status:  Discontinued     1,500 mg 125 mL/hr over 120 Minutes Intravenous Every 24 hours 05/15/13 0916 05/17/13 1055   05/14/13 0600  vancomycin (VANCOCIN) 1,500 mg in sodium chloride 0.9 %  500 mL IVPB  Status:  Discontinued     1,500 mg 250 mL/hr over 120 Minutes Intravenous Every 24 hours 05/13/13 0521 05/13/13 1128   05/14/13 0400  vancomycin (VANCOCIN) 1,250 mg in sodium chloride 0.9 % 250 mL IVPB  Status:  Discontinued     1,250 mg 166.7 mL/hr over 90 Minutes Intravenous To Surgery 05/13/13 1510 05/13/13 1901   05/14/13 0400  cefUROXime (ZINACEF) 1.5 g in dextrose 5 % 50 mL IVPB  Status:  Discontinued     1.5 g 100 mL/hr over 30 Minutes Intravenous To Surgery 05/13/13 1510 05/13/13 1901   05/14/13 0400  cefUROXime (ZINACEF) 750 mg in dextrose 5 % 50 mL IVPB  Status:  Discontinued     750 mg 100 mL/hr over 30 Minutes Intravenous To Surgery 05/13/13 1111 05/13/13 1124   05/14/13 0145  vancomycin (VANCOCIN) IVPB 1000 mg/200 mL premix  Status:  Discontinued     1,000 mg 200 mL/hr over 60 Minutes Intravenous  Once 05/13/13 1932 05/13/13 1955   05/13/13 2145  cefUROXime (ZINACEF) 1.5 g in dextrose 5 % 50 mL IVPB  Status:  Discontinued     1.5 g 100 mL/hr over 30 Minutes Intravenous Every 12 hours 05/13/13 1932 05/13/13 1955   05/13/13 1200  piperacillin-tazobactam  (ZOSYN) IVPB 3.375 g     3.375 g 12.5 mL/hr over 240 Minutes Intravenous Every 8 hours 05/13/13 0521     05/13/13 1145  [MAR Hold]  cefUROXime (ZINACEF) 1.5 g in dextrose 5 % 50 mL IVPB     (On MAR Hold since 05/13/13 1306)   1.5 g 100 mL/hr over 30 Minutes Intravenous To Surgery 05/13/13 1130 05/13/13 1320   05/13/13 1130  cefUROXime (ZINACEF) 750 mg in dextrose 5 % 50 mL IVPB  Status:  Discontinued     750 mg 100 mL/hr over 30 Minutes Intravenous To Surgery 05/13/13 1124 05/13/13 1926   05/13/13 1125  vancomycin (VANCOCIN) 1,500 mg in sodium chloride 0.9 % 500 mL IVPB  Status:  Discontinued     1,500 mg 250 mL/hr over 120 Minutes Intravenous Every 24 hours 05/13/13 1128 05/15/13 0914   05/13/13 0530  vancomycin (VANCOCIN) 2,000 mg in sodium chloride 0.9 % 500 mL IVPB     2,000 mg 250 mL/hr over 120 Minutes Intravenous  Once 05/13/13 0521 05/13/13 0800   05/13/13 0530  piperacillin-tazobactam (ZOSYN) IVPB 3.375 g  Status:  Discontinued     3.375 g 100 mL/hr over 30 Minutes Intravenous  Once 05/13/13 0521 05/13/13 1933      Assessment: tx from [facility] with CP=NSTEMI. Emergent CABG 7/9  Infectious Disease -Vanc/Zosyn 7/9 AM for presumed asp PNA. Tmax 100. WBC 9.1. Scr 2.67 up this am. UOP 0.6 last 24h.  7/13 Vanco trough slightly elevated at 26.1.  Vanc 7/9 >> Zosyn 7/9 >>] Urine 7/10>>negative   Goal of Therapy:  Vancomycin trough level 15-20 mcg/ml  Plan:  -F/U warfarin restart for DVT -Continue Zosyn 3.375G IV q8h to be infused over 4 hours -Change Vancomycin to 1500mg  IV q48hr per protocol   Teodor Prater S. Merilynn Finland, PharmD, BCPS Clinical Staff Pharmacist Pager [phone]  Misty Stanley Stillinger 05/17/2013,10:56 AM

## 2013-05-17 NOTE — Progress Notes (Addendum)
      [address]       [name] [address]             [phone]        CARDIOTHORACIC SURGERY PROGRESS NOTE   R4 Days Post-Op Procedure(s) (LRB): CORONARY ARTERY BYPASS GRAFTING (CABG) (N/A)  Subjective: Still feels nauseated.  Constantly requesting pain medicine.  Poorly motivated.  Objective: Vital signs: BP Readings from Last 1 Encounters:  05/17/13 141/68   Pulse Readings from Last 1 Encounters:  05/17/13 65   Resp Readings from Last 1 Encounters:  05/17/13 13   Temp Readings from Last 1 Encounters:  05/17/13 97.4 F (36.3 C) Oral    Hemodynamics: PAP: (31-47)/(10-27) 31/10 mmHg CO:  [4.4 L/min] 4.4 L/min CI:  [1.9 L/min/m2] 1.9 L/min/m2  Physical Exam:  Rhythm:   sinus  Breath sounds: Diminished at bases  Heart sounds:  RRR  Incisions:  Clean and dry  Abdomen:  Distended but non tender, tympanitic, few BS's  Extremities:  Swollen, adequately perfused   Intake/Output from previous day: 07/12 0701 - 07/13 0700 In: 1793.1 [P.O.:480; I.V.:763.1; IV Piggyback:550] Out: 1815 [Urine:1745; Chest Tube:70] Intake/Output this shift: Total I/O In: 133.4 [P.O.:60; I.V.:73.4] Out: 30 [Urine:30]  Lab Results:  Recent Labs  05/16/13 0400 05/17/13 0350  WBC 8.6 9.1  HGB 9.7* 9.1*  HCT 30.5* 28.2*  PLT 106* 114*   BMET:  Recent Labs  05/16/13 0400 05/17/13 0350  NA 138 137  K 3.7 3.8  CL 107 106  CO2 22 21  GLUCOSE 128* 105*  BUN 43* 52*  CREATININE 2.27* 2.67*  CALCIUM 6.9* 7.2*    CBG (last 3)   Recent Labs  05/16/13 2329 05/17/13 0348 05/17/13 0721  GLUCAP 94 101* 105*   ABG    Component Value Date/Time   PHART 7.305* 05/15/2013 1713   HCO3 15.6* 05/15/2013 1713   TCO2 17 05/15/2013 1713   ACIDBASEDEF 10.0* 05/15/2013 1713   O2SAT 89.0 05/15/2013 1713   CXR: *RADIOLOGY REPORT*  Clinical Data: Postop from CABG. Collagen vascular disease.  Cancer. The.  PORTABLE CHEST - 1 VIEW  Comparison: 05/16/2013  Findings:  The endotracheal tube and Swan-Ganz catheter have been  removed. Mediastinal drain and nasogastric tube have also been  removed.  Diffuse pulmonary interstitial edema shows no significant change.  Persistent small bilateral pleural effusions are seen, right side  greater than left as well as left retrocardiac atelectasis. These  findings are also stable. No evidence of pneumothorax.  Cardiomegaly is unchanged.  IMPRESSION:  1. Stable pulmonary edema, small bilateral pleural effusions, and  left retrocardiac atelectasis.  2. No pneumothorax visualized.  Original Report Authenticated By: [name], M.D.   Assessment/Plan: S/P Procedure(s) (LRB): CORONARY ARTERY BYPASS GRAFTING (CABG) (N/A)  Overall stable POD4 Maintaining NSR on IV amiodarone BP stable Acute on chronic renal insufficiency, worse Pulmonary edema, volume excess, stable Post operative ileus Obesity   Clear liquids only for now and check KUB  Try reglan  Mobilize  Watch renal function closely  Hold diuretics for now  lovenox for DVT prophylaxis   Lyniah Fujita H 05/17/2013 9:44 AM

## 2013-05-17 NOTE — Progress Notes (Signed)
PROGRESS NOTE  Subjective:   Pt is doing well.  Making slow progress after emergent CABG.  Objective:    Vital Signs:   Temp:  [97.4 F (36.3 C)-99.7 F (37.6 C)] 97.4 F (36.3 C) (07/13 0724) Pulse Rate:  [61-103] 65 (07/13 0900) Resp:  [13-24] 13 (07/13 0900) BP: (92-142)/(48-68) 141/68 mmHg (07/13 0900) SpO2:  [89 %-99 %] 93 % (07/13 0900) Arterial Line BP: (84-180)/(39-82) 144/52 mmHg (07/13 0900) Weight:  [267 lb 3.2 oz (121.2 kg)] 267 lb 3.2 oz (121.2 kg) (07/13 0700)  Last BM Date: 05/11/13   24-hour weight change: Weight change:   Weight trends: Filed Weights   05/14/13 0500 05/15/13 0455 05/17/13 0700  Weight: 267 lb 3.2 oz (121.2 kg) 267 lb 10.2 oz (121.4 kg) 267 lb 3.2 oz (121.2 kg)    Intake/Output:  07/12 0701 - 07/13 0700 In: 1793.1 [P.O.:480; I.V.:763.1; IV Piggyback:550] Out: 1815 [Urine:1745; Chest Tube:70] Total I/O In: 133.4 [P.O.:60; I.V.:73.4] Out: 30 [Urine:30]   Physical Exam: BP 141/68  Pulse 65  Temp(Src) 97.4 F (36.3 C) (Oral)  Resp 13  Ht 6\' 1"  (1.854 m)  Wt 267 lb 3.2 oz (121.2 kg)  BMI 35.26 kg/m2  SpO2 93%  General: Vital signs reviewed and noted.   Head: Normocephalic, atraumatic.  Eyes: conjunctivae/corneas clear.  EOM's intact.   Throat: normal  Neck:  normal  Lungs:    clear  Heart:  RR,  Abdomen:  Soft, non-tender, non-distended  , obese  Extremities: Trace - 1+ edema   Neurologic: A&O X3, CN II - XII are grossly intact.   Psych: Normal     Labs: BMET:  Recent Labs  05/15/13 0350 05/16/13 0400 05/17/13 0350  NA 139 138 137  K 4.4 3.7 3.8  CL 108 107 106  CO2 21 22 21   GLUCOSE 165* 128* 105*  BUN 44* 43* 52*  CREATININE 2.47* 2.27* 2.67*  CALCIUM 7.3* 6.9* 7.2*  MG 2.9* 2.5  --   PHOS 4.8* 3.9  --     Liver function tests: No results found for this basename: AST, ALT, ALKPHOS, BILITOT, PROT, ALBUMIN,  in the last 72 hours No results found for this basename: LIPASE, AMYLASE,  in the last 72  hours  CBC:  Recent Labs  05/16/13 0400 05/17/13 0350  WBC 8.6 9.1  HGB 9.7* 9.1*  HCT 30.5* 28.2*  MCV 82.0 81.7  PLT 106* 114*    Cardiac Enzymes: No results found for this basename: CKTOTAL, CKMB, TROPONINI,  in the last 72 hours  Coagulation Studies:  Recent Labs  05/15/13 0350  LABPROT 25.6*  INR 2.43*    Other: No components found with this basename: POCBNP,  No results found for this basename: DDIMER,  in the last 72 hours No results found for this basename: HGBA1C,  in the last 72 hours No results found for this basename: CHOL, HDL, LDLCALC, TRIG, CHOLHDL,  in the last 72 hours No results found for this basename: TSH, T4TOTAL, FREET3, T3FREE, THYROIDAB,  in the last 72 hours No results found for this basename: VITAMINB12, FOLATE, FERRITIN, TIBC, IRON, RETICCTPCT,  in the last 72 hours   Other results:  Tele:  NSR  Medications:    Infusions: . sodium chloride    . amiodarone (NEXTERONE PREMIX) 360 mg/200 mL dextrose 30 mg/hr (05/17/13 0400)  . DOPamine Stopped (05/16/13 2300)    Scheduled Medications: . acetaminophen  1,000 mg Oral Q6H  . ipratropium  0.5 mg Nebulization Q6H  And  . albuterol  2.5 mg Nebulization Q6H  . antiseptic oral rinse  15 mL Mouth Rinse BID  . aspirin EC  325 mg Oral Daily  . atorvastatin  80 mg Oral q1800  . bisacodyl  10 mg Oral Daily   Or  . bisacodyl  10 mg Rectal Daily  . chlorhexidine  15 mL Mouth/Throat BID  . docusate sodium  100 mg Oral Daily  . enoxaparin (LOVENOX) injection  30 mg Subcutaneous Q24H  . metoCLOPramide (REGLAN) injection  5 mg Intravenous Q6H  . metoprolol tartrate  12.5 mg Oral BID  . pantoprazole  40 mg Oral Daily  . piperacillin-tazobactam (ZOSYN)  IV  3.375 g Intravenous Q8H  . sodium chloride  3 mL Intravenous Q12H  . vancomycin (VANCOCIN) IVPB 1500 mg/250 mL central line  1,500 mg Intravenous Q24H    Assessment/ Plan:    1. CAD:  S/p emergent CABG for critical LM / 3 V CAD.    S/p Left internal mammary graft to the LAD  Seq SVG to intermediate and OM   SVG to PDA. Doing much better.    Acute respiratory failure     Disposition: plans per TCTS Length of Stay: 5  Vesta Mixer, Montez Hageman., MD, [facility] 05/17/2013, 10:23 AM Office [phone] Pager [phone]

## 2013-05-17 NOTE — Progress Notes (Signed)
PULMONARY  / CRITICAL CARE MEDICINE  Name: [name] MRN: [id number] DOB: 11/05/1934    ADMISSION DATE:  05/12/2013 CONSULTATION DATE:  05/13/2013  REFERRING MD :  Cardiology PRIMARY SERVICE:  Cardiology  CHIEF COMPLAINT:  Acute respiratory failure  BRIEF PATIENT DESCRIPTION: 79 yo with past medical history of RA, LE DVT, AF on Coumadin transferred from [facility] on 7/8 with NSTEMI.  Cardiac cath postponed secondary to coagulopathy / elevated Cr.  On 7/9 developed hypoxemic respiratory failure requiring intubation. Cath w 3 vessel dz, underwent CABG 05/13/13.   SIGNIFICANT EVENTS / STUDIES:  7/8  Transferred from [facility] with NSTEMI 7/8  TTE >>> EF=45-50, regional wall motion abnormalities, diastolic dysfunction 7/9  Intubated for hypoxemic respiratory failure 7/9 L cath >> 3 vessel dz 7/9 CABG (Dr Laneta Simmers)  LINES / TUBES: OETT 7/9 >>> 7/12 OGT 7/9 >>> 7/12  Foley 7/9 >>> R IJ cordis 7/9 >>  L radial art 7/9 >>   CULTURES: MRSA screen 7/9 >> positive Urine 7/10 >> negative  ANTIBIOTICS: Zosyn 7/9 >>> Vancomycin 7/9 >>>  INTERVAL HISTORY: C/o chest soreness and nausea.  VITAL SIGNS: Temp:  [97.4 F (36.3 C)-100 F (37.8 C)] 97.4 F (36.3 C) (07/13 0724) Pulse Rate:  [61-103] 64 (07/13 0700) Resp:  [16-26] 16 (07/13 0700) BP: (92-142)/(48-78) 132/64 mmHg (07/13 0700) SpO2:  [89 %-99 %] 97 % (07/13 0700) Arterial Line BP: (84-180)/(39-82) 155/58 mmHg (07/13 0700) FiO2 (%):  [40 %] 40 % (07/12 0800) Weight:  [267 lb 3.2 oz (121.2 kg)] 267 lb 3.2 oz (121.2 kg) (07/13 0700) 4 liters [place]  INTAKE / OUTPUT: Intake/Output     07/12 0701 - 07/13 0700 07/13 0701 - 07/14 0700   P.O. 480    I.V. (mL/kg) 763.1 (6.3)    NG/GT     IV Piggyback 550    Total Intake(mL/kg) 1793.1 (14.8)    Urine (mL/kg/hr) 1715 (0.6)    Emesis/NG output     Chest Tube 70 (0)    Total Output 1785     Net +8.1            PHYSICAL EXAMINATION: General: No distress,  sitting in chair Neuro: Alert, normal strength HEENT: no sinus tenderness Cardiovascular:  Regular, no murmur Lungs:  Basilar rales Abdomen:  Soft, non tender Musculoskeletal: cool, no edema Skin: no rashes  LABS: CBC Recent Labs     05/15/13  0350  05/16/13  0400  05/17/13  0350  WBC  11.1*  8.6  9.1  HGB  10.0*  9.7*  9.1*  HCT  31.4*  30.5*  28.2*  PLT  122*  106*  114*    Coag's Recent Labs     05/15/13  0350  INR  2.43*    BMET Recent Labs     05/15/13  0350  05/16/13  0400  05/17/13  0350  NA  139  138  137  K  4.4  3.7  3.8  CL  108  107  106  CO2  21  22  21   BUN  44*  43*  52*  CREATININE  2.47*  2.27*  2.67*  GLUCOSE  165*  128*  105*    Electrolytes Recent Labs     05/14/13  1600  05/15/13  0350  05/16/13  0400  05/17/13  0350  CALCIUM   --   7.3*  6.9*  7.2*  MG  3.0*  2.9*  2.5   --  PHOS   --   4.8*  3.9   --     Sepsis Markers Recent Labs     05/14/13  1235  05/15/13  0353  05/16/13  0400  PROCALCITON  14.47  14.37  8.07    ABG Recent Labs     05/14/13  1413  05/14/13  1639  05/15/13  1713  PHART  7.296*  7.327*  7.305*  PCO2ART  41.9  35.7  31.6*  PO2ART  82.0  68.0*  63.0*    Glucose Recent Labs     05/16/13  0718  05/16/13  1143  05/16/13  1556  05/16/13  1951  05/16/13  2329  05/17/13  0348  GLUCAP  107*  111*  110*  99  94  101*    Imaging Dg Chest Port 1 View  05/16/2013   *RADIOLOGY REPORT*  Clinical Data: Status post CABG.  PORTABLE CHEST - 1 VIEW  Comparison: 05/15/2013  Findings: Endotracheal tube remains with the tip approximately 3.5 cm above the carina.  Swan-Ganz catheter tip is in the proximal right pulmonary artery.  No pneumothorax is identified.  The lung volumes are reduced with persistent bilateral lower lobe atelectasis present.  Suggestion of mild interstitial edema.  No large pleural effusions are identified.  Heart size and mediastinal contours are stable.  IMPRESSION: Reduced lung  volumes bilaterally and bilateral lower lobe atelectasis.  No pneumothorax.  Suggestion of mild residual interstitial edema.   Original Report Authenticated By: Irish Lack, M.D.       ASSESSMENT / PLAN:  PULMONARY A:  Acute hypoxemic / hypercarbic respiratory failure.  Acute cardiogenic pulmonary edema vs aspiration pneumonia (asymmettric R sided airspace disease, distended abdomen, encephalopathy). Suspect pulm edema given rapid improvement. P:   Oxygen to keep SpO2 > 92% F/u CXR Change BD's to q6h >> may be able to change to prn soon  CARDIOVASCULAR A: NSTEMI.   AF/RVR, improved after intubated.   Acute on chronic systolic and diastolic CHF.   Hypotension. P:  Per TCTS  RENAL A:  Acute on Chronic renal failure.  Fluid overload. P:   Negative fluid balance as tolerated  GASTROINTESTINAL A: Nutrition. Nausea. P:   Per TCTS PRN zofran  HEMATOLOGIC A: Anemia, thrombocytopenia. P:  F/u CBC  INFECTIOUS A:  Possible aspiration pneumonia / HCAP. Pct supports infectious process  P:   vanco + zosyn 7/9 >> narrow based on cx's and clinical course  ENDOCRINE  A:  Hyperglycemia.  No h/o DM.   P:   SSI  NEUROLOGIC A:  Post-op pain. P:   Per TCTS  Respiratory status stable after extubation.  PCCM will sign off.  Please call if additional help needed.  Coralyn Helling, MD [facility] Pulmonary/Critical Care 05/17/2013, 7:27 AM Pager:  [phone] After 3pm call: [phone]

## 2013-05-17 NOTE — Progress Notes (Signed)
Critical value received from Lab, [place], of vanc Level 26.1. Pharmacy  [facility] notified. 10 am vanc dose held.

## 2013-05-18 LAB — CBC
HCT: 30.8 % — ABNORMAL LOW (ref 39.0–52.0)
Hemoglobin: 9.7 g/dL — ABNORMAL LOW (ref 13.0–17.0)
MCH: 26.1 pg (ref 26.0–34.0)
MCHC: 31.5 g/dL (ref 30.0–36.0)

## 2013-05-18 LAB — BASIC METABOLIC PANEL
BUN: 62 mg/dL — ABNORMAL HIGH (ref 6–23)
CO2: 23 mEq/L (ref 19–32)
Calcium: 7.1 mg/dL — ABNORMAL LOW (ref 8.4–10.5)
GFR calc non Af Amer: 18 mL/min — ABNORMAL LOW (ref >90)
Glucose, Bld: 102 mg/dL — ABNORMAL HIGH (ref 70–99)

## 2013-05-18 MED ORDER — FUROSEMIDE 10 MG/ML IJ SOLN
160.0000 mg | Freq: Once | INTRAMUSCULAR | Status: AC
  Administered 2013-05-18: 160 mg via INTRAVENOUS
  Filled 2013-05-18 (×2): qty 16

## 2013-05-18 MED ORDER — BOOST / RESOURCE BREEZE PO LIQD
1.0000 | Freq: Three times a day (TID) | ORAL | Status: DC
  Administered 2013-05-18: 1 via ORAL

## 2013-05-18 MED ORDER — FUROSEMIDE 10 MG/ML IJ SOLN
160.0000 mg | Freq: Four times a day (QID) | INTRAVENOUS | Status: AC
  Administered 2013-05-18 – 2013-05-19 (×3): 160 mg via INTRAVENOUS
  Filled 2013-05-18 (×3): qty 16

## 2013-05-18 MED ORDER — DARBEPOETIN ALFA-POLYSORBATE 60 MCG/0.3ML IJ SOLN
60.0000 ug | INTRAMUSCULAR | Status: DC
  Administered 2013-05-26: 60 ug via SUBCUTANEOUS
  Filled 2013-05-18 (×4): qty 0.3

## 2013-05-18 NOTE — Progress Notes (Signed)
A little bit more alert this afternoon  Was weaker today when up to chair than he was yesterday  BP 111/47  Pulse 72  Temp(Src) 97.6 F (36.4 C) (Oral)  Resp 10  Ht 6\' 1"  (1.854 m)  Wt 269 lb 6.4 oz (122.2 kg)  BMI 35.55 kg/m2  SpO2 95%   Intake/Output Summary (Last 24 hours) at 05/18/13 1631 Last data filed at 05/18/13 1100  Gross per 24 hour  Intake 1153.3 ml  Output    665 ml  Net  488.3 ml    UO remains low. Appreciate renal input

## 2013-05-18 NOTE — Progress Notes (Signed)
Patient very lethargic thus far this shift; maximum to dependent assist to get up from chair to bed and is very unsteady on feet. Patient falls asleep in mid sentence; flat affect; unable to swallow oral meds safely at this time, and is unable to participate in plan of care at this time. Patient has been encouraged several times this shift to perform Incentive Spirometry and coughing/deep breathing exercises; yet, patient is unable to perform tasks as asked before falling back to sleep. Dr. Doylene Canning and Dr. Laneta Simmers has been made aware of increased lethargy with change of mental status as compared from yesterday to today. No pain meds have been administered this shift; last documented pain med was given at 0430 am per [facility].  No acute distress noted at this time. VSS. Safety maintained.

## 2013-05-18 NOTE — Progress Notes (Signed)
Getting patient to chair this am and noticed pt had pills in his mouth he had not swallowed. There were 3 or 4 oxy and 2 tylenol in patients mouth. Pt was asked to spit medication out and to swallow the two tylenol. Pt did not respond when asked why he was not swallowing pills. Pt swallows water fine with no coughing or choking. I am not sure why he was not swallowing his pills at this time. He asked for pain medication throughout the night every hour r/t acute pain in his chest and abdomen. Will pass onto day shift. Pt is currently sitting in the chair, resting comfortably with vital signs in normal limits.

## 2013-05-18 NOTE — Progress Notes (Signed)
Reported off to oncoming RN. Safety maintained this shift. No acute distress noted.

## 2013-05-18 NOTE — Progress Notes (Signed)
NUTRITION FOLLOW UP  Intervention:   Add Resource Breeze po TID, each supplement provides 250 kcal and 9 grams of protein. RD to continue to follow nutrition care plan.  Nutrition Dx:   Inadequate oral intake related to need for slow diet advancement AEB clear liquid diet order.  New Goal:   Intake to meet >90% of estimated nutrition needs.  Monitor:   weight trends, labs, I/O's, po intake, bowel function, supplement acceptance, diet advancement  Assessment:   Pt transferred from [facility] related to STEMI. Pt required emergent intubation related to acute respiratory failure in the setting of ischemia. S/p cardiac cath and CABG on 7/9.  Extubated 7/12. KUB ordered yesterday - bowel gas pattern suggestive of mild ileus. Pt complaining of nausea recently. Remains on clear liquids at this time. Per MD note, pt is over-sedated, recommending limiting narcotics as able.  Last BM 7 days ago.  Receiving lasix. Beginning to mobilize as able.  Height: Ht Readings from Last 1 Encounters:  05/12/13 6\' 1"  (1.854 m)    Weight Status:   Wt Readings from Last 1 Encounters:  05/18/13 269 lb 6.4 oz (122.2 kg)  Wt trending up; + 8.6 L since admission  Body mass index is 35.55 kg/(m^2). Obese Class II.  Re-estimated needs:  Kcal: 2000 - 2200 Protein: 100 - 120 g Fluid: at least 2 liters daily  Skin:  Chest incision R leg incision L leg incision L tibial wound  Diet Order: Clear Liquid   Intake/Output Summary (Last 24 hours) at 05/18/13 1147 Last data filed at 05/18/13 1100  Gross per 24 hour  Intake 1476.8 ml  Output    810 ml  Net  666.8 ml    Last BM: 7/7   Labs:   Recent Labs Lab 05/14/13 1600 05/14/13 1636 05/15/13 0350 05/16/13 0400 05/17/13 0350 05/18/13 0400  NA  --  142 139 138 137 139  K  --  4.6 4.4 3.7 3.8 3.8  CL  --  111 108 107 106 104  CO2  --   --  21 22 21 23   BUN  --  39* 44* 43* 52* 62*  CREATININE 2.52* 2.50* 2.47* 2.27* 2.67* 3.06*   CALCIUM  --   --  7.3* 6.9* 7.2* 7.1*  MG 3.0*  --  2.9* 2.5  --   --   PHOS  --   --  4.8* 3.9  --   --   GLUCOSE  --  149* 165* 128* 105* 102*    CBG (last 3)   Recent Labs  05/17/13 1159 05/17/13 1537 05/17/13 2008  GLUCAP 98 101* 96    Scheduled Meds: . acetaminophen  1,000 mg Oral Q6H  . ipratropium  0.5 mg Nebulization Q6H   And  . albuterol  2.5 mg Nebulization Q6H  . antiseptic oral rinse  15 mL Mouth Rinse BID  . aspirin EC  325 mg Oral Daily  . atorvastatin  80 mg Oral q1800  . bisacodyl  10 mg Oral Daily   Or  . bisacodyl  10 mg Rectal Daily  . chlorhexidine  15 mL Mouth/Throat BID  . docusate sodium  100 mg Oral Daily  . enoxaparin (LOVENOX) injection  30 mg Subcutaneous Q24H  . furosemide  160 mg Intravenous Q6H  . metoprolol tartrate  12.5 mg Oral BID  . pantoprazole  40 mg Oral Daily  . piperacillin-tazobactam (ZOSYN)  IV  3.375 g Intravenous Q8H  . sodium chloride  3  mL Intravenous Q12H  . vancomycin (VANCOCIN) IVPB 1500 mg/250 mL central line  1,500 mg Intravenous Q48H    Continuous Infusions: . sodium chloride    . amiodarone (NEXTERONE PREMIX) 360 mg/200 mL dextrose 30 mg/hr (05/18/13 0428)    Jarold Motto MS, RD, LDN Pager: [phone] After-hours pager: [phone]

## 2013-05-18 NOTE — Progress Notes (Addendum)
TCTS DAILY ICU PROGRESS NOTE                   [name]            [place] [address]          [phone]   5 Days Post-Op Procedure(s) (LRB): CORONARY ARTERY BYPASS GRAFTING (CABG) (N/A)  Total Length of Stay:  LOS: 6 days   Subjective: Appears over sedated, c/o pain, + flatus- no BM. Currently in afib-110  Objective: Vital signs in last 24 hours: Temp:  [97.3 F (36.3 C)-97.5 F (36.4 C)] 97.5 F (36.4 C) (07/14 0400) Pulse Rate:  [64-118] 104 (07/14 0700) Cardiac Rhythm:  [-] Normal sinus rhythm (07/14 0000) Resp:  [8-25] 9 (07/14 0700) BP: (91-154)/(43-91) 95/43 mmHg (07/14 0700) SpO2:  [85 %-97 %] 93 % (07/14 0700) Arterial Line BP: (139-177)/(52-61) 169/55 mmHg (07/13 1100) Weight:  [269 lb 6.4 oz (122.2 kg)] 269 lb 6.4 oz (122.2 kg) (07/14 0500)  Filed Weights   05/15/13 0455 05/17/13 0700 05/18/13 0500  Weight: 267 lb 10.2 oz (121.4 kg) 267 lb 3.2 oz (121.2 kg) 269 lb 6.4 oz (122.2 kg)    Weight change: 2 lb 3.3 oz (1 kg)   Hemodynamic parameters for last 24 hours:    Intake/Output from previous day: 07/13 0701 - 07/14 0700 In: 1240.8 [P.O.:210; I.V.:880.8; IV Piggyback:150] Out: 750 [Urine:750]  Intake/Output this shift:    Current Meds: Scheduled Meds: . acetaminophen  1,000 mg Oral Q6H  . ipratropium  0.5 mg Nebulization Q6H   And  . albuterol  2.5 mg Nebulization Q6H  . antiseptic oral rinse  15 mL Mouth Rinse BID  . aspirin EC  325 mg Oral Daily  . atorvastatin  80 mg Oral q1800  . bisacodyl  10 mg Oral Daily   Or  . bisacodyl  10 mg Rectal Daily  . chlorhexidine  15 mL Mouth/Throat BID  . docusate sodium  100 mg Oral Daily  . enoxaparin (LOVENOX) injection  30 mg Subcutaneous Q24H  . metoCLOPramide (REGLAN) injection  5 mg Intravenous Q6H  . metoprolol tartrate  12.5 mg Oral BID  . pantoprazole  40 mg Oral Daily  . piperacillin-tazobactam (ZOSYN)  IV  3.375 g Intravenous Q8H  . sodium chloride  3 mL Intravenous  Q12H  . vancomycin (VANCOCIN) IVPB 1500 mg/250 mL central line  1,500 mg Intravenous Q48H   Continuous Infusions: . sodium chloride    . amiodarone (NEXTERONE PREMIX) 360 mg/200 mL dextrose 30 mg/hr (05/18/13 0428)  . DOPamine Stopped (05/16/13 2300)   PRN Meds:.metoprolol, morphine injection, ondansetron (ZOFRAN) IV, oxyCODONE, sodium chloride  General appearance: distracted, fatigued and no distress Heart: irregularly irregular rhythm Lungs: crackles bilat Abdomen: + distension, + BS , mild TTP Extremities: + BLE edema Wound: healing well  Lab Results: CBC: Recent Labs  05/17/13 0350 05/18/13 0400  WBC 9.1 7.0  HGB 9.1* 9.7*  HCT 28.2* 30.8*  PLT 114* 168   BMET:  Recent Labs  05/17/13 0350 05/18/13 0400  NA 137 139  K 3.8 3.8  CL 106 104  CO2 21 23  GLUCOSE 105* 102*  BUN 52* 62*  CREATININE 2.67* 3.06*  CALCIUM 7.2* 7.1*     ABG    Component Value Date/Time   PHART 7.305* 05/15/2013 1713   PCO2ART 31.6* 05/15/2013 1713   PO2ART 63.0* 05/15/2013 1713   HCO3 15.6* 05/15/2013 1713   TCO2 17 05/15/2013 1713  ACIDBASEDEF 10.0* 05/15/2013 1713   O2SAT 89.0 05/15/2013 1713      PT/INR: No results found for this basename: LABPROT, INR,  in the last 72 hours Radiology: Dg Chest Port 1 View  05/17/2013   *RADIOLOGY REPORT*  Clinical Data: Postop from CABG. Collagen vascular disease. Cancer.  The.  PORTABLE CHEST - 1 VIEW  Comparison: 05/16/2013  Findings: The endotracheal tube and Swan-Ganz catheter have been removed.  Mediastinal drain and nasogastric tube have also been removed.  Diffuse pulmonary interstitial edema shows no significant change. Persistent small bilateral pleural effusions are seen, right side greater than left as well as left retrocardiac atelectasis.  These findings are also stable.  No evidence of pneumothorax. Cardiomegaly is unchanged.  IMPRESSION:  1.  Stable pulmonary edema, small bilateral pleural effusions, and left retrocardiac atelectasis.  2.  No pneumothorax visualized.   Original Report Authenticated By: [name], M.D.   Dg Abd Portable 1v  05/17/2013   *RADIOLOGY REPORT*  Clinical Data: Abdominal distention.  Question ileus.  PORTABLE ABDOMEN - 1 VIEW  Comparison: CT abdomen and pelvis 05/23/2007.  Findings: There is mild gaseous distention of small and large bowel and the stomach.  IMPRESSION: Bowel gas pattern suggestive of mild ileus.   Original Report Authenticated By: [name], M.D.     Assessment/Plan: S/P Procedure(s) (LRB): CORONARY ARTERY BYPASS GRAFTING (CABG) (N/A)  1 over sedated, limit narcotics as able 2 creat rising, UO- decreased , will give IV lasix  3 check LFT's  4 will need to change to po amio at some point, cont IV for now as in afib with RVR 5 recheck of KUB ordered 6 cont pulm rx, mobilize as able   [name],[name] 05/18/2013 7:25 AM  Chart reviewed, patient examined, agree with above. He ambulated around ICU yesterday but today is very lethargic. He did get a lot of Morphine overnight. He has good strength in all extremities and knows where he is and why. He may still be narcotized. His creatinine continues to rise. He received 160 mg lasix this am without much response. He has acute on chronic renal failure with creat 1.8 on admission followed by acute pulmonary edema with cardiogenic shock, cath, emergent CABG. I will consult nephrology in case this continues to worsen. Condition discussed with wife and son at the bedside. He remains afebrile with normal WBC ct. I think we can stop the vanc and zosyn.

## 2013-05-18 NOTE — Consult Note (Signed)
[facility] Renal Consultation Note  Requesting MD: Dr. Laneta Simmers Indication for Consultation: A on CRF  HPI:  [name] is a 79 y.o. male with PMhx significant for HTN, RA, hypothyroidism along with diffuse vascular dz to include CAD, PAD, AAA and common iliac artery dissection.  He also has hx of atrial flutter and CKD with trend noted below- ct 1.5-.16 in 2012 and then was 1.77 when admitted on 7/9 with CP and recurrent a flutter with positive troponin- His hospital course progressed quickly with him needing cardiac cath on 7/9  and eventually semi emergent CABG that same day.  Kidney function initially worsened- not surprisingly but stayed stable for a few checks until yesterday trended up from 2.27 to 2.67 and was 3.06 today.  UOP is still there but did decrease to under a liter yesterday.  BP's have been marginal down as low as 91/72 on only low dose metoprolol and IV amiodarone.  We are asked to assist with volume management and any impending dialysis needs.  Pt is very somnolent at the moment. Felt possibly due to narcotics and poor clearance of them.  Pt was given high dose lasix today appropriately but did not have a robust UOP in response as of yet.    Creatinine, Ser  Date/Time Value Range Status  05/18/2013  4:00 AM 3.06* 0.50 - 1.35 mg/dL Final  1/91/4782  9:56 AM 2.67* 0.50 - 1.35 mg/dL Final  12/19/863  7:84 AM 2.27* 0.50 - 1.35 mg/dL Final  6/96/2952  8:41 AM 2.47* 0.50 - 1.35 mg/dL Final  01/26/4009  2:72 PM 2.50* 0.50 - 1.35 mg/dL Final  5/36/6440  3:47 PM 2.52* 0.50 - 1.35 mg/dL Final  02/27/9562  8:75 AM 2.13* 0.50 - 1.35 mg/dL Final  04/08/3328  5:18 AM 1.77* 0.50 - 1.35 mg/dL Final  8/41/6606  3:01 AM 1.58* 0.4 - 1.5 mg/dL Final  04/06/931  3:55 AM 1.56* 0.4 - 1.5 mg/dL Final  7/32/2025  4:27 AM 2.21* 0.4 - 1.5 mg/dL Final  0/62/3762  8:31 PM 2.24* 0.4 - 1.5 mg/dL Final  51/76/1607 37:10 AM 2.01* 0.4 - 1.5 mg/dL Final  62/69/4854  6:27 AM 1.67*  Final      PMHx:   Past Medical History  Diagnosis Date  . Cancer   . Hypertension   . Collagen vascular disease   . Anginal pain   . Depression   . Arthritis     Past Surgical History  Procedure Laterality Date  . Eye surgery    . Coronary artery bypass graft N/A 05/13/2013    Procedure: CORONARY ARTERY BYPASS GRAFTING (CABG);  Surgeon: Alleen Borne, MD;  Location: [facility] OR;  Service: Open Heart Surgery;  Laterality: N/A;    Family Hx: History reviewed. No pertinent family history.  Social History:  reports that he has never smoked. He does not have any smokeless tobacco history on file. He reports that he does not drink alcohol or use illicit drugs.  Allergies: No Known Allergies  Medications: Prior to Admission medications   Medication Sig Start Date End Date Taking? Authorizing Provider  aspirin EC 81 MG tablet Take 81 mg by mouth daily.   Yes Historical Provider, MD  diltiazem (TIAZAC) 360 MG 24 hr capsule Take 360 mg by mouth daily.   Yes Historical Provider, MD  simvastatin (ZOCOR) 40 MG tablet Take 40 mg by mouth every evening.   Yes Historical Provider, MD  warfarin (COUMADIN) 5 MG tablet Take 5 mg by  mouth daily.   Yes Historical Provider, MD    I have reviewed the patient's current medications.  Labs:  Results for orders placed during the hospital encounter of 05/12/13 (from the past 48 hour(s))  GLUCOSE, CAPILLARY     Status: Abnormal   Collection Time    05/16/13  3:56 PM      Result Value Range   Glucose-Capillary 110 (*) 70 - 99 mg/dL   Comment 1 Notify RN    GLUCOSE, CAPILLARY     Status: None   Collection Time    05/16/13  7:51 PM      Result Value Range   Glucose-Capillary 99  70 - 99 mg/dL  GLUCOSE, CAPILLARY     Status: None   Collection Time    05/16/13 11:29 PM      Result Value Range   Glucose-Capillary 94  70 - 99 mg/dL   Comment 1 Notify RN    GLUCOSE, CAPILLARY     Status: Abnormal   Collection Time    05/17/13  3:48 AM      Result Value  Range   Glucose-Capillary 101 (*) 70 - 99 mg/dL   Comment 1 Notify RN    BASIC METABOLIC PANEL     Status: Abnormal   Collection Time    05/17/13  3:50 AM      Result Value Range   Sodium 137  135 - 145 mEq/L   Potassium 3.8  3.5 - 5.1 mEq/L   Chloride 106  96 - 112 mEq/L   CO2 21  19 - 32 mEq/L   Glucose, Bld 105 (*) 70 - 99 mg/dL   BUN 52 (*) 6 - 23 mg/dL   Creatinine, Ser 1.61 (*) 0.50 - 1.35 mg/dL   Calcium 7.2 (*) 8.4 - 10.5 mg/dL   GFR calc non Af Amer 21 (*) >90 mL/min   GFR calc Af Amer 25 (*) >90 mL/min   Comment:            The eGFR has been calculated     using the CKD EPI equation.     This calculation has not been     validated in all clinical     situations.     eGFR's persistently     <90 mL/min signify     possible Chronic Kidney Disease.  CBC     Status: Abnormal   Collection Time    05/17/13  3:50 AM      Result Value Range   WBC 9.1  4.0 - 10.5 K/uL   RBC 3.45 (*) 4.22 - 5.81 MIL/uL   Hemoglobin 9.1 (*) 13.0 - 17.0 g/dL   HCT 09.6 (*) 04.5 - 40.9 %   MCV 81.7  78.0 - 100.0 fL   MCH 26.4  26.0 - 34.0 pg   MCHC 32.3  30.0 - 36.0 g/dL   RDW 81.1 (*) 91.4 - 78.2 %   Platelets 114 (*) 150 - 400 K/uL   Comment: CONSISTENT WITH PREVIOUS RESULT  GLUCOSE, CAPILLARY     Status: Abnormal   Collection Time    05/17/13  7:21 AM      Result Value Range   Glucose-Capillary 105 (*) 70 - 99 mg/dL   Comment 1 Notify RN    VANCOMYCIN, TROUGH     Status: Abnormal   Collection Time    05/17/13  9:25 AM      Result Value Range   Vancomycin Tr 26.1 (*) 10.0 - 20.0  ug/mL   Comment: CRITICAL RESULT CALLED TO, READ BACK BY AND VERIFIED WITH:     SHAW S.RN 05/17/13 1040 BY JONESJ  GLUCOSE, CAPILLARY     Status: None   Collection Time    05/17/13 11:59 AM      Result Value Range   Glucose-Capillary 98  70 - 99 mg/dL  GLUCOSE, CAPILLARY     Status: Abnormal   Collection Time    05/17/13  3:37 PM      Result Value Range   Glucose-Capillary 101 (*) 70 - 99 mg/dL    Comment 1 Notify RN    GLUCOSE, CAPILLARY     Status: None   Collection Time    05/17/13  8:08 PM      Result Value Range   Glucose-Capillary 96  70 - 99 mg/dL  CBC     Status: Abnormal   Collection Time    05/18/13  4:00 AM      Result Value Range   WBC 7.0  4.0 - 10.5 K/uL   RBC 3.72 (*) 4.22 - 5.81 MIL/uL   Hemoglobin 9.7 (*) 13.0 - 17.0 g/dL   HCT 16.1 (*) 09.6 - 04.5 %   MCV 82.8  78.0 - 100.0 fL   MCH 26.1  26.0 - 34.0 pg   MCHC 31.5  30.0 - 36.0 g/dL   RDW 40.9 (*) 81.1 - 91.4 %   Platelets 168  150 - 400 K/uL   Comment: DELTA CHECK NOTED     REPEATED TO VERIFY  BASIC METABOLIC PANEL     Status: Abnormal   Collection Time    05/18/13  4:00 AM      Result Value Range   Sodium 139  135 - 145 mEq/L   Potassium 3.8  3.5 - 5.1 mEq/L   Chloride 104  96 - 112 mEq/L   CO2 23  19 - 32 mEq/L   Glucose, Bld 102 (*) 70 - 99 mg/dL   BUN 62 (*) 6 - 23 mg/dL   Creatinine, Ser 7.82 (*) 0.50 - 1.35 mg/dL   Calcium 7.1 (*) 8.4 - 10.5 mg/dL   GFR calc non Af Amer 18 (*) >90 mL/min   GFR calc Af Amer 21 (*) >90 mL/min   Comment:            The eGFR has been calculated     using the CKD EPI equation.     This calculation has not been     validated in all clinical     situations.     eGFR's persistently     <90 mL/min signify     possible Chronic Kidney Disease.     ROS:  Review of systems not obtained due to patient factors.  Physical Exam: Filed Vitals:   05/18/13 1201  BP:   Pulse:   Temp: 97.6 F (36.4 C)  Resp:      General: somnolent but arousable- lying in bed- O2 sat is adequate on 4 liters Lucas HEENT: PERRLA, EOMI- mucous membranes moist Neck: positive for JVD Heart: slightly tachy Lungs: anteriorly clear Abdomen: obese, distended, positive BS Extremities: pitting edema present Skin: warm and dry Neuro: somnolent but arousable  Assessment/Plan:79 year old WM with multiple medical problems including CKD who now has A on CRF in the setting of emergent  cath/CABG and hemodynamic instability 1.Renal- A on CRF in the setting of above. Unfortunately UOP is marginal.  I agree to challenge with high dose lasix as  is volume overloaded at this time.  Unfortunately BP also remains marginal but not sure there is anything we can do about that.  I would not want to stop lopressor and he is on such a small dose. We have to hope that if we can support him through this eventually his kidneys will wake back up and improve.  There are no indications for HD at this time but will follow labs and UOP closely.   2. Hypertension/volume  - is volume overloaded- agree to challenge with high dose lasix- continue to support BP as able because I think that is the key here- as he achieves better renal perfusion he will auto diurese.   3. Anemia  - hgb 9.7 - just slightly down for situation.  Will add aranesp and follow- transfuse as needed.   Thank you for this consult , will follow with you   Zayley Arras A 05/18/2013, 2:47 PM

## 2013-05-18 NOTE — Progress Notes (Signed)
Poor PO intake noted this shift secondary to lethargy - Dr. Laneta Simmers made aware.  Poor UOP noted this shift despite lasix administration - Dr. Laneta Simmers aware - Nephrology consulted per Dr. Laneta Simmers Unable to ambulate patient this shift, or use incentive spirometer secondary to increased lethargy - Dr. Laneta Simmers and Dr. Dorris Fetch made aware.  Patient is lethargic but easy to arouse with GCS 14.

## 2013-05-19 ENCOUNTER — Inpatient Hospital Stay (HOSPITAL_COMMUNITY): Payer: Medicare Other

## 2013-05-19 DIAGNOSIS — I5023 Acute on chronic systolic (congestive) heart failure: Secondary | ICD-10-CM

## 2013-05-19 DIAGNOSIS — N179 Acute kidney failure, unspecified: Secondary | ICD-10-CM

## 2013-05-19 LAB — BASIC METABOLIC PANEL
Calcium: 6.9 mg/dL — ABNORMAL LOW (ref 8.4–10.5)
Creatinine, Ser: 4.35 mg/dL — ABNORMAL HIGH (ref 0.50–1.35)
GFR calc non Af Amer: 12 mL/min — ABNORMAL LOW (ref >90)
Glucose, Bld: 114 mg/dL — ABNORMAL HIGH (ref 70–99)
Sodium: 141 mEq/L (ref 135–145)

## 2013-05-19 LAB — POCT I-STAT 3, ART BLOOD GAS (G3+)
O2 Saturation: 90 %
Patient temperature: 38.1
TCO2: 21 mmol/L (ref 0–100)
pCO2 arterial: 37.8 mmHg (ref 35.0–45.0)

## 2013-05-19 LAB — HEPATIC FUNCTION PANEL
ALT: 38 U/L (ref 0–53)
Albumin: 2.4 g/dL — ABNORMAL LOW (ref 3.5–5.2)
Alkaline Phosphatase: 107 U/L (ref 39–117)
Indirect Bilirubin: 0.6 mg/dL (ref 0.3–0.9)
Total Bilirubin: 2.5 mg/dL — ABNORMAL HIGH (ref 0.3–1.2)

## 2013-05-19 MED ORDER — FUROSEMIDE 10 MG/ML IJ SOLN
160.0000 mg | Freq: Four times a day (QID) | INTRAVENOUS | Status: AC
  Administered 2013-05-19 (×3): 160 mg via INTRAVENOUS
  Filled 2013-05-19 (×3): qty 16

## 2013-05-19 MED ORDER — TRAMADOL HCL 50 MG PO TABS: 50.0000 mg | ORAL_TABLET | Freq: Four times a day (QID) | ORAL | Status: DC | PRN

## 2013-05-19 MED ORDER — PANTOPRAZOLE SODIUM 40 MG IV SOLR
40.0000 mg | INTRAVENOUS | Status: DC
  Administered 2013-05-19 – 2013-05-26 (×8): 40 mg via INTRAVENOUS
  Filled 2013-05-19 (×13): qty 40

## 2013-05-19 MED ORDER — AMIODARONE HCL 200 MG PO TABS
400.0000 mg | ORAL_TABLET | Freq: Two times a day (BID) | ORAL | Status: DC
  Filled 2013-05-19 (×2): qty 2

## 2013-05-19 MED ORDER — AMIODARONE HCL IN DEXTROSE 360-4.14 MG/200ML-% IV SOLN
30.0000 mg/h | INTRAVENOUS | Status: AC
  Administered 2013-05-19 – 2013-05-27 (×16): 30 mg/h via INTRAVENOUS
  Filled 2013-05-19 (×33): qty 200

## 2013-05-19 NOTE — Evaluation (Signed)
Physical Therapy Evaluation Patient Details Name: [name] MRN: [id number] DOB: 1934/05/04 Today's Date: 05/19/2013 Time: 1027-2536 PT Time Calculation (min): 30 min  PT Assessment / Plan / Recommendation History of Present Illness  Pt transferred from [facility] with NSTEMI s/p CABG with elevated creatinine and lethargy  Clinical Impression  Pt with decreased awareness, orientation and problem solving in addition to lethargy that family reports has been present since surgery. Pt with decreased mobility, functional activity and awareness of precautions who will benefit from acute therapy to address deficits and decrease burden of care. Both pt sons are available for assist at discharge and plan to assist spouse with pt returning home. Will follow.     PT Assessment  Patient needs continued PT services    Follow Up Recommendations  Home health PT;Supervision/Assistance - 24 hour    Does the patient have the potential to tolerate intense rehabilitation      Barriers to Discharge        Equipment Recommendations  None recommended by PT    Recommendations for Other Services OT consult   Frequency Min 3X/week    Precautions / Restrictions Precautions Precautions: Sternal;Fall   Pertinent Vitals/Pain No pain HR 112 with afib throughout sats difficult to monitor due to probe on 4L for mobility with sats 95% on 2L at rest      Mobility  Bed Mobility Bed Mobility: Rolling Left;Left Sidelying to Sit;Sitting - Scoot to Edge of Bed Rolling Left: 3: Mod assist Left Sidelying to Sit: 3: Mod assist;HOB elevated Sitting - Scoot to Edge of Bed: 3: Mod assist Details for Bed Mobility Assistance: cueing for sequence, safety and mobility with adherence to precautions Transfers Transfers: Sit to Stand;Stand to Sit Sit to Stand: 4: Min assist;From bed Stand to Sit: 4: Min assist;To chair/3-in-1;To bed Details for Transfer Assistance: cueing for hand placement, sequence and posture  with transfers x 2 trials from bed Ambulation/Gait Ambulation/Gait Assistance: 4: Min assist (+1 to follow with chair for safety with lethargy) Ambulation Distance (Feet): 15 Feet Assistive device: Rolling walker Ambulation/Gait Assistance Details: cuieng for posture and position in RW with constant cueing to progress mobility  Gait Pattern: Shuffle;Trunk flexed;Decreased stride length Gait velocity: decreased Stairs: No    Exercises     PT Diagnosis: Difficulty walking;Altered mental status  PT Problem List: Decreased cognition;Decreased activity tolerance;Decreased mobility;Decreased knowledge of use of DME;Decreased knowledge of precautions PT Treatment Interventions: Gait training;Stair training;Functional mobility training;Therapeutic activities;Therapeutic exercise;Cognitive remediation;Patient/family education     PT Goals(Current goals can be found in the care plan section) Acute Rehab PT Goals Patient Stated Goal: return to gardening PT Goal Formulation: With patient/family Time For Goal Achievement: 06/02/13 Potential to Achieve Goals: Fair  Visit Information  Last PT Received On: 05/19/13 Assistance Needed: +2 (safety for amb) History of Present Illness: Pt transferred from [facility] with NSTEMI s/p CABG with elevated creatinine and lethargy       Prior Functioning  Home Living Family/patient expects to be discharged to:: Private residence Living Arrangements: Spouse/significant other;Children (sons can help at DC 24hrs) Available Help at Discharge: Family;Available 24 hours/day Type of Home: House Home Access: Stairs to enter [employer] of Steps: 3 Home Layout: One level Home Equipment: Walker - 2 wheels;Bedside commode;Wheelchair - manual Prior Function Level of Independence: Independent Communication Communication: No difficulties    Cognition  Cognition Arousal/Alertness: Lethargic Behavior During Therapy: Flat affect Overall Cognitive  Status: Impaired/Different from baseline Area of Impairment: Attention;Orientation;Memory Orientation Level: Disoriented to;Time Current Attention  Level: Sustained Memory: Decreased recall of precautions;Decreased short-term memory General Comments: Pt with difficulty recalling questions and answering them without repetition, pt lethargic throughout    Extremity/Trunk Assessment Upper Extremity Assessment Upper Extremity Assessment: Generalized weakness Lower Extremity Assessment Lower Extremity Assessment: Overall WFL for tasks assessed Cervical / Trunk Assessment Cervical / Trunk Assessment: Kyphotic   Balance Balance Balance Assessed: Yes Static Sitting Balance Static Sitting - Balance Support: Feet supported;Bilateral upper extremity supported Static Sitting - Level of Assistance: 5: Stand by assistance Static Sitting - Comment/# of Minutes: 5  End of Session PT - End of Session Equipment Utilized During Treatment: Gait belt;Oxygen Activity Tolerance: Patient limited by fatigue;Patient limited by lethargy Patient left: in chair;with call bell/phone within reach;with nursing/sitter in room;with family/visitor present Nurse Communication: Mobility status  GP     Delorse Lek 05/19/2013, 1:00 PM Delaney Meigs, PT [phone]

## 2013-05-19 NOTE — Plan of Care (Signed)
Problem: Problem: Cardiovascular Progression Goal: OTHER CARDIOVASCULAR GOAL(S) CR up to 4, renal consult

## 2013-05-19 NOTE — Progress Notes (Signed)
Patient ID: [name], male   DOB: 1934/03/08, 79 y.o.   MRN: [id number]  SICU Evening rounds:  Hemodynamically stable. In and out of A-fib on IV amio  Urine output 20/hr.  Still lethargic today but awakens and follows commands.

## 2013-05-19 NOTE — Progress Notes (Signed)
Patient ID: [name], male   DOB: 1934-07-10, 79 y.o.   MRN: [id number]  Procedure Note:  Right Subclavian triple lumen catheter.  Sterile prep and drape, gown, gloves and mask. 1% lidocaine local anesthesia. A triple lumen central line was inserted into the right subclavian vein using Seldinger technique. Good blood return through all ports. CXR pending. Tolerated well.

## 2013-05-19 NOTE — Progress Notes (Addendum)
[facility] DAILY ICU PROGRESS NOTE                   [address]            [place] [address]          [phone]   6 Days Post-Op Procedure(s) (LRB): CORONARY ARTERY BYPASS GRAFTING (CABG) (N/A)  Total Length of Stay:  LOS: 7 days   Subjective: Feels better, less pain, feels stronger.  Objective: Vital signs in last 24 hours: Temp:  [97.6 F (36.4 C)-98.1 F (36.7 C)] 97.7 F (36.5 C) (07/15 0723) Pulse Rate:  [29-113] 74 (07/15 0400) Cardiac Rhythm:  [-] Normal sinus rhythm (07/15 0400) Resp:  [6-15] 13 (07/15 0400) BP: (94-128)/(47-64) 110/56 mmHg (07/15 0400) SpO2:  [73 %-99 %] 96 % (07/15 0400) Weight:  [267 lb 13.7 oz (121.5 kg)] 267 lb 13.7 oz (121.5 kg) (07/15 0500)  Filed Weights   05/17/13 0700 05/18/13 0500 05/19/13 0500  Weight: 267 lb 3.2 oz (121.2 kg) 269 lb 6.4 oz (122.2 kg) 267 lb 13.7 oz (121.5 kg)    Weight change: -1 lb 8.7 oz (-0.7 kg)   Hemodynamic parameters for last 24 hours:    Intake/Output from previous day: 07/14 0701 - 07/15 0700 In: 1304.7 [I.V.:790.7; IV Piggyback:514] Out: 635 [Urine:635]  Intake/Output this shift:    Current Meds: Scheduled Meds: . ipratropium  0.5 mg Nebulization Q6H   And  . albuterol  2.5 mg Nebulization Q6H  . antiseptic oral rinse  15 mL Mouth Rinse BID  . aspirin EC  325 mg Oral Daily  . atorvastatin  80 mg Oral q1800  . bisacodyl  10 mg Oral Daily   Or  . bisacodyl  10 mg Rectal Daily  . chlorhexidine  15 mL Mouth/Throat BID  . darbepoetin (ARANESP) injection - NON-DIALYSIS  60 mcg Subcutaneous Q Tue-1800  . docusate sodium  100 mg Oral Daily  . enoxaparin (LOVENOX) injection  30 mg Subcutaneous Q24H  . feeding supplement  1 Container Oral TID BM  . pantoprazole  40 mg Oral Daily  . sodium chloride  3 mL Intravenous Q12H   Continuous Infusions: . sodium chloride    . amiodarone (NEXTERONE PREMIX) 360 mg/200 mL dextrose 30 mg/hr (05/18/13 1354)   PRN Meds:.ondansetron (ZOFRAN)  IV, oxyCODONE, sodium chloride  General appearance: alert, cooperative, no distress and less sedated, more oriented Heart: sl irregular Lungs: sl decreased in bases Abdomen: mod distension, + BS( high pitched), mild ttp Extremities: + edema Wound: incis healing well  Lab Results: CBC: Recent Labs  05/17/13 0350 05/18/13 0400  WBC 9.1 7.0  HGB 9.1* 9.7*  HCT 28.2* 30.8*  PLT 114* 168   BMET:  Recent Labs  05/18/13 0400 05/19/13 0500  NA 139 141  K 3.8 3.9  CL 104 103  CO2 23 20  GLUCOSE 102* 114*  BUN 62* 77*  CREATININE 3.06* 4.35*  CALCIUM 7.1* 6.9*    PT/INR: No results found for this basename: LABPROT, INR,  in the last 72 hours Radiology: Dg Abd Portable 1v  05/17/2013   *RADIOLOGY REPORT*  Clinical Data: Abdominal distention.  Question ileus.  PORTABLE ABDOMEN - 1 VIEW  Comparison: CT abdomen and pelvis 05/23/2007.  Findings: There is mild gaseous distention of small and large bowel and the stomach.  IMPRESSION: Bowel gas pattern suggestive of mild ileus.   Original Report Authenticated By: [name], M.D.     Assessment/Plan: S/P Procedure(s) (LRB):  CORONARY ARTERY BYPASS GRAFTING (CABG) (N/A)  1 Creat conts to rise, borderline UO with lasix challenge. Nephrology consulting. He is still sig volume overloaded and has pulm edema 2 afib, - will change to po amio  3 cont pulm toilet/ rehab- more alert today with minimal pain meds. PT consult 4 LFT's - sl increased AST/ T. Bili- monitor while on amio 5 cbg's controlled 6 ultram for pain  GOLD,WAYNE E 05/19/2013 7:34 AM  Chart reviewed, patient examined, agree with above. He is not alert enough to take pills and was pocketing pills over the weekend. Will hold off on oral meds until alert. Keep on IV amio. I will plan to insert a new central line later today so we can remove right neck line.

## 2013-05-19 NOTE — Progress Notes (Signed)
Subjective:  Unfortunately UOP marginal overnight despite lasix.  Pt a little more alert but still not where he was- BP remains lowish- looks like lopressor has been stopped Objective Vital signs in last 24 hours: Filed Vitals:   05/19/13 0403 05/19/13 0500 05/19/13 0723 05/19/13 0743  BP:      Pulse:      Temp: 97.9 F (36.6 C)  97.7 F (36.5 C)   TempSrc: Oral  Oral   Resp:      Height:      Weight:  121.5 kg (267 lb 13.7 oz)    SpO2:    93%   Weight change: -0.7 kg (-1 lb 8.7 oz)  Intake/Output Summary (Last 24 hours) at 05/19/13 0802 Last data filed at 05/19/13 0700  Gross per 24 hour  Intake   1268 ml  Output    610 ml  Net    658 ml   Labs: Basic Metabolic Panel:  Recent Labs Lab 05/14/13 1636 05/15/13 0350 05/16/13 0400 05/17/13 0350 05/18/13 0400 05/19/13 0500  NA 142 139 138 137 139 141  K 4.6 4.4 3.7 3.8 3.8 3.9  CL 111 108 107 106 104 103  CO2  --  21 22 21 23 20   GLUCOSE 149* 165* 128* 105* 102* 114*  BUN 39* 44* 43* 52* 62* 77*  CREATININE 2.50* 2.47* 2.27* 2.67* 3.06* 4.35*  CALCIUM  --  7.3* 6.9* 7.2* 7.1* 6.9*  PHOS  --  4.8* 3.9  --   --   --    Liver Function Tests:  Recent Labs Lab 05/19/13 0500  AST 50*  ALT 38  ALKPHOS 107  BILITOT 2.5*  PROT 6.0  ALBUMIN 2.4*   No results found for this basename: LIPASE, AMYLASE,  in the last 168 hours No results found for this basename: AMMONIA,  in the last 168 hours CBC:  Recent Labs Lab 05/14/13 1600  05/15/13 0350 05/16/13 0400 05/17/13 0350 05/18/13 0400  WBC 10.7*  --  11.1* 8.6 9.1 7.0  HGB 10.1*  < > 10.0* 9.7* 9.1* 9.7*  HCT 31.8*  < > 31.4* 30.5* 28.2* 30.8*  MCV 83.5  --  83.5 82.0 81.7 82.8  PLT 130*  --  122* 106* 114* 168  < > = values in this interval not displayed. Cardiac Enzymes:  Recent Labs Lab 05/12/13 1748 05/13/13 0141  TROPONINI 6.21* 8.93*   CBG:  Recent Labs Lab 05/17/13 0348 05/17/13 0721 05/17/13 1159 05/17/13 1537 05/17/13 2008  GLUCAP 101*  105* 98 101* 96    Iron Studies: No results found for this basename: IRON, TIBC, TRANSFERRIN, FERRITIN,  in the last 72 hours Studies/Results: Dg Chest Port 1 View  05/19/2013   *RADIOLOGY REPORT*  Clinical Data: Post CABG, MI, hypertension, history CHF  PORTABLE CHEST - 1 VIEW  Comparison: Portable exam 0606 hours compared to 05/17/2013  Findings: Enlargement of cardiac silhouette post CABG. Pulmonary vascular congestion. Scattered interstitial infiltrates likely persistent pulmonary edema. Persistent atelectasis versus consolidation of left lower lobe. Minimal right basilar atelectasis. Probable small bilateral pleural effusions. No pneumothorax.  IMPRESSION: Persistent CHF with probable small bilateral pleural effusions and persistent atelectasis versus consolidation of the the left lower lobe.   Original Report Authenticated By: Ulyses Southward, M.D.   Dg Abd Portable 1v  05/17/2013   *RADIOLOGY REPORT*  Clinical Data: Abdominal distention.  Question ileus.  PORTABLE ABDOMEN - 1 VIEW  Comparison: CT abdomen and pelvis 05/23/2007.  Findings: There is mild gaseous distention  of small and large bowel and the stomach.  IMPRESSION: Bowel gas pattern suggestive of mild ileus.   Original Report Authenticated By: [name], M.D.   Medications: Infusions: . sodium chloride      Scheduled Medications: . ipratropium  0.5 mg Nebulization Q6H   And  . albuterol  2.5 mg Nebulization Q6H  . amiodarone  400 mg Oral BID  . antiseptic oral rinse  15 mL Mouth Rinse BID  . aspirin EC  325 mg Oral Daily  . atorvastatin  80 mg Oral q1800  . bisacodyl  10 mg Oral Daily   Or  . bisacodyl  10 mg Rectal Daily  . chlorhexidine  15 mL Mouth/Throat BID  . darbepoetin (ARANESP) injection - NON-DIALYSIS  60 mcg Subcutaneous Q Tue-1800  . docusate sodium  100 mg Oral Daily  . enoxaparin (LOVENOX) injection  30 mg Subcutaneous Q24H  . feeding supplement  1 Container Oral TID BM  . pantoprazole  40 mg Oral Daily   . sodium chloride  3 mL Intravenous Q12H    have reviewed scheduled and prn medications.  Physical Exam: General: sitting up on chair- arousable to stimulation Heart: RRR Lungs: mostly clear Abdomen: distended Extremities: pitting edema   Assessment/Plan:79 year old WM with multiple medical problems including CKD who now has A on CRF in the setting of emergent cath/CABG and hemodynamic instability  1.Renal- A on CRF in the setting of above. Unfortunately UOP is still marginal. Will continue to challenge with high dose lasix as is volume overloaded at this time. Lopressor has been stopped to try and get better renal perfusion.   We just have to hope that this turns around before something like dialysis may be needed.    There are no indications for HD at this time but will follow labs and UOP closely. If no progress made in the next 24-48 hours he will need dialysis support.  2. Hypertension/volume - is volume overloaded- agree to challenge with high dose lasix- continue to support BP as able because I think that is the key here- as he achieves better renal perfusion he will auto diurese.  3. Anemia - hgb 9.7 - just slightly down for situation. Will add aranesp and follow- transfuse as needed.    [name] A   05/19/2013,8:02 AM  LOS: 7 days

## 2013-05-20 LAB — HEPARIN LEVEL (UNFRACTIONATED): Heparin Unfractionated: 0.62 IU/mL (ref 0.30–0.70)

## 2013-05-20 LAB — CBC
MCH: 26.4 pg (ref 26.0–34.0)
MCHC: 32.9 g/dL (ref 30.0–36.0)
Platelets: 221 10*3/uL (ref 150–400)
RDW: 18.6 % — ABNORMAL HIGH (ref 11.5–15.5)

## 2013-05-20 LAB — RENAL FUNCTION PANEL
Albumin: 2.4 g/dL — ABNORMAL LOW (ref 3.5–5.2)
CO2: 20 mEq/L (ref 19–32)
Calcium: 6.7 mg/dL — ABNORMAL LOW (ref 8.4–10.5)
Creatinine, Ser: 5.17 mg/dL — ABNORMAL HIGH (ref 0.50–1.35)
GFR calc Af Amer: 11 mL/min — ABNORMAL LOW (ref >90)
GFR calc non Af Amer: 10 mL/min — ABNORMAL LOW (ref >90)
Phosphorus: 6.5 mg/dL — ABNORMAL HIGH (ref 2.3–4.6)
Sodium: 142 mEq/L (ref 135–145)

## 2013-05-20 MED ORDER — HEPARIN (PORCINE) IN NACL 100-0.45 UNIT/ML-% IJ SOLN
1500.0000 [IU]/h | INTRAMUSCULAR | Status: DC
  Administered 2013-05-20 – 2013-05-21 (×2): 1500 [IU]/h via INTRAVENOUS
  Filled 2013-05-20 (×3): qty 250

## 2013-05-20 NOTE — Progress Notes (Signed)
Patient ID: [name], male   DOB: Oct 11, 1934, 79 y.o.   MRN: [id number] EVENING ROUNDS NOTE :     [address]       [place] [address]             [phone]                 7 Days Post-Op Procedure(s) (LRB): CORONARY ARTERY BYPASS GRAFTING (CABG) (N/A)  Total Length of Stay:  LOS: 8 days  BP 119/67  Pulse 36  Temp(Src) 97.6 F (36.4 C) (Oral)  Resp 16  Ht 6\' 1"  (1.854 m)  Wt 275 lb 6.4 oz (124.921 kg)  BMI 36.34 kg/m2  SpO2 97%  .Intake/Output     07/15 0701 - 07/16 0700 07/16 0701 - 07/17 0700   I.V. (mL/kg) 868.1 (6.9) 2986.3 (23.9)   IV Piggyback 132    Total Intake(mL/kg) 1000.1 (8) 2986.3 (23.9)   Urine (mL/kg/hr) 545 (0.2) 200 (0.1)   Stool 1 (0)    Total Output 546 200   Net +454.1 +2786.3          . sodium chloride    . amiodarone (NEXTERONE PREMIX) 360 mg/200 mL dextrose 30 mg/hr (05/20/13 1800)  . heparin 1,500 Units/hr (05/20/13 1800)     Lab Results  Component Value Date   WBC 9.6 05/20/2013   HGB 8.3* 05/20/2013   HCT 25.2* 05/20/2013   PLT 221 05/20/2013   GLUCOSE 123* 05/20/2013   CHOL 172 05/13/2013   TRIG 141 05/13/2013   HDL 47 05/13/2013   LDLCALC 97 05/13/2013   ALT 38 05/19/2013   AST 50* 05/19/2013   NA 142 05/20/2013   K 3.7 05/20/2013   CL 104 05/20/2013   CREATININE 5.17* 05/20/2013   BUN 93* 05/20/2013   CO2 20 05/20/2013   TSH 10.857* 05/12/2013   INR 2.43* 05/15/2013   Still in aFIB Hypothyriod, on Cordarone may need thyroid supplement will discuss with medical service in am  Delight Ovens MD  Beeper [phone] Office [phone] 05/20/2013 6:50 PM

## 2013-05-20 NOTE — Progress Notes (Signed)
Subjective:  Unfortunately UOP marginal overnight despite lasix.  Pt [name] little more alert but still not where he was- BP remains lowish Objective Vital signs in last 24 hours: Filed Vitals:   05/20/13 0500 05/20/13 0600 05/20/13 0700 05/20/13 0742  BP: 124/59 110/59 100/74   Pulse: 111 109 109   Temp:    97.7 F (36.5 C)  TempSrc:    Oral  Resp: 13 17 16    Height:      Weight: 124.921 kg (275 lb 6.4 oz)     SpO2: 98% 89% 97%    Weight change: 3.421 kg (7 lb 8.7 oz)  Intake/Output Summary (Last 24 hours) at 05/20/13 0748 Last data filed at 05/20/13 0700  Gross per 24 hour  Intake 1000.1 ml  Output    546 ml  Net  454.1 ml   Labs: Basic Metabolic Panel:  Recent Labs Lab 05/15/13 0350 05/16/13 0400  05/18/13 0400 05/19/13 0500 05/20/13 0500  NA 139 138  < > 139 141 142  K 4.4 3.7  < > 3.8 3.9 3.7  CL 108 107  < > 104 103 104  CO2 21 22  < > 23 20 20   GLUCOSE 165* 128*  < > 102* 114* 123*  BUN 44* 43*  < > 62* 77* 93*  CREATININE 2.47* 2.27*  < > 3.06* 4.35* 5.17*  CALCIUM 7.3* 6.9*  < > 7.1* 6.9* 6.7*  PHOS 4.8* 3.9  --   --   --  6.5*  < > = values in this interval not displayed. Liver Function Tests:  Recent Labs Lab 05/19/13 0500 05/20/13 0500  AST 50*  --   ALT 38  --   ALKPHOS 107  --   BILITOT 2.5*  --   PROT 6.0  --   ALBUMIN 2.4* 2.4*   No results found for this basename: LIPASE, AMYLASE,  in the last 168 hours No results found for this basename: AMMONIA,  in the last 168 hours CBC:  Recent Labs Lab 05/15/13 0350 05/16/13 0400 05/17/13 0350 05/18/13 0400 05/20/13 0500  WBC 11.1* 8.6 9.1 7.0 9.6  HGB 10.0* 9.7* 9.1* 9.7* 8.3*  HCT 31.4* 30.5* 28.2* 30.8* 25.2*  MCV 83.5 82.0 81.7 82.8 80.3  PLT 122* 106* 114* 168 221   Cardiac Enzymes: No results found for this basename: CKTOTAL, CKMB, CKMBINDEX, TROPONINI,  in the last 168 hours CBG:  Recent Labs Lab 05/17/13 0348 05/17/13 0721 05/17/13 1159 05/17/13 1537 05/17/13 2008  GLUCAP  101* 105* 98 101* 96    Iron Studies: No results found for this basename: IRON, TIBC, TRANSFERRIN, FERRITIN,  in the last 72 hours Studies/Results: Dg Chest Port 1 View  05/19/2013   *RADIOLOGY REPORT*  Clinical Data: Status post right subclavian central line.  PORTABLE CHEST - 1 VIEW  Comparison: 05/17/2013  Findings: Right subclavian central line is in place.  The catheter tip is in the region of the lower SVC.  No evidence for [name] pneumothorax.  Again noted is [name] right jugular central line which is unchanged in position.  Heart size remains enlarged.  Diffuse interstitial densities may represent edema.  Median sternotomy wires are present.  IMPRESSION: Right subclavian central line tip is in the lower SVC region.  No evidence for [name] pneumothorax.  Diffuse interstitial lung densities are most compatible with edema.  Cardiomegaly.   Original Report Authenticated By: Richarda Overlie, M.D.   Dg Chest Port 1 View  05/19/2013   *RADIOLOGY REPORT*  Clinical Data: Post CABG, MI, hypertension, history CHF  PORTABLE CHEST - 1 VIEW  Comparison: Portable exam 0606 hours compared to 05/17/2013  Findings: Enlargement of cardiac silhouette post CABG. Pulmonary vascular congestion. Scattered interstitial infiltrates likely persistent pulmonary edema. Persistent atelectasis versus consolidation of left lower lobe. Minimal right basilar atelectasis. Probable small bilateral pleural effusions. No pneumothorax.  IMPRESSION: Persistent CHF with probable small bilateral pleural effusions and persistent atelectasis versus consolidation of the the left lower lobe.   Original Report Authenticated By: Ulyses Southward, M.D.   Medications: Infusions: . sodium chloride    . amiodarone (NEXTERONE PREMIX) 360 mg/200 mL dextrose 30 mg/hr (05/20/13 0541)    Scheduled Medications: . ipratropium  0.5 mg Nebulization Q6H   And  . albuterol  2.5 mg Nebulization Q6H  . antiseptic oral rinse  15 mL Mouth Rinse BID  . chlorhexidine  15 mL  Mouth/Throat BID  . darbepoetin (ARANESP) injection - NON-DIALYSIS  60 mcg Subcutaneous Q Tue-1800  . enoxaparin (LOVENOX) injection  30 mg Subcutaneous Q24H  . feeding supplement  1 Container Oral TID BM  . pantoprazole (PROTONIX) IV  40 mg Intravenous Q24H  . sodium chloride  3 mL Intravenous Q12H    have reviewed scheduled and prn medications.  Physical Exam: General: in bed- arousable to stimulation- probably not comprehending what I am saying to him Heart: RRR Lungs: mostly clear Abdomen: distended Extremities: pitting edema   Assessment/Plan:79 year old WM with multiple medical problems including CKD who now has [name] on CRF in the setting of emergent cath/CABG and hemodynamic instability  1.Renal- [name] on CRF in the setting of above. Unfortunately UOP is still marginal.  Lopressor has been stopped to try and get better renal perfusion.   We just have to hope that this turns around before something like dialysis may be needed.    There are no indications for HD at this time but will follow labs and UOP closely. If no progress made in the next 24 hours he will need dialysis support. I spoke with Dr. Laneta Simmers and we will give him one more day.   2. Hypertension/volume - is volume overloaded- no response yet to  high dose lasix- continue to support BP as able because I think that is the key here- as he achieves better renal perfusion he will auto diurese.  3. Anemia - hgb 8.3 -  down for situation. Have added aranesp and follow- transfuse as needed.    [name]   05/20/2013,7:48 AM  LOS: 8 days

## 2013-05-20 NOTE — Progress Notes (Signed)
NUTRITION FOLLOW UP  Intervention:   If unable to advance diet soon, recommend initiation of nutrition support - pt has been NPO/Clear Liquid x 7 days. RD to continue to follow nutrition care plan.  Nutrition Dx:   Inadequate oral intake related to need for slow diet advancement AEB clear liquid diet order. Ongoing.  New Goal:   Intake to meet >90% of estimated nutrition needs. Unmet.  Monitor:   weight trends, labs, I/O's, po intake, bowel function, supplement acceptance, diet advancement  Assessment:   Pt transferred [facility] related to STEMI. Pt required emergent intubation related to acute respiratory failure in the setting of ischemia. S/p cardiac cath and CABG on 7/9.  Extubated 7/12. KUB 7/13 - bowel gas pattern suggestive of mild ileus. Was made NPO 7/15; remains NPO at this time.  Poor oral intake 2/2 increased lethargy. Pt noted to pocket pills recently, oral meds on hold until more alert.  Last BM 9 days ago.  Receiving lasix, however UOP remains marginal. Renal following, per most recent note, if no progress in next 24 hours, pt will need HD.  Height: Ht Readings from Last 1 Encounters:  05/12/13 6\' 1"  (1.854 m)    Weight Status:   Wt Readings from Last 1 Encounters:  05/20/13 275 lb 6.4 oz (124.921 kg)  Wt trending up; + 9.5 L since admission  Body mass index is 36.34 kg/(m^2). Obese Class II.  Re-estimated needs:  Kcal: 2000 - 2200 Protein: 100 - 120 g Fluid: at least 2 liters daily  Skin:  Chest incision R leg incision L leg incision L tibial wound  Diet Order: NPO   Intake/Output Summary (Last 24 hours) at 05/20/13 0919 Last data filed at 05/20/13 0800  Gross per 24 hour  Intake  912.7 ml  Output    521 ml  Net  391.7 ml    Last BM: 7/7   Labs:   Recent Labs Lab 05/14/13 1600  05/15/13 0350 05/16/13 0400  05/18/13 0400 05/19/13 0500 05/20/13 0500  NA  --   < > 139 138  < > 139 141 142  K  --   < > 4.4 3.7  < >  3.8 3.9 3.7  CL  --   < > 108 107  < > 104 103 104  CO2  --   --  21 22  < > 23 20 20   BUN  --   < > 44* 43*  < > 62* 77* 93*  CREATININE 2.52*  < > 2.47* 2.27*  < > 3.06* 4.35* 5.17*  CALCIUM  --   --  7.3* 6.9*  < > 7.1* 6.9* 6.7*  MG 3.0*  --  2.9* 2.5  --   --   --   --   PHOS  --   --  4.8* 3.9  --   --   --  6.5*  GLUCOSE  --   < > 165* 128*  < > 102* 114* 123*  < > = values in this interval not displayed.  CBG (last 3)   Recent Labs  05/17/13 1159 05/17/13 1537 05/17/13 2008  GLUCAP 98 101* 96    Scheduled Meds: . ipratropium  0.5 mg Nebulization Q6H   And  . albuterol  2.5 mg Nebulization Q6H  . antiseptic oral rinse  15 mL Mouth Rinse BID  . chlorhexidine  15 mL Mouth/Throat BID  . darbepoetin (ARANESP) injection - NON-DIALYSIS  60 mcg Subcutaneous Q Tue-1800  .  enoxaparin (LOVENOX) injection  30 mg Subcutaneous Q24H  . feeding supplement  1 Container Oral TID BM  . pantoprazole (PROTONIX) IV  40 mg Intravenous Q24H  . sodium chloride  3 mL Intravenous Q12H    Continuous Infusions: . sodium chloride    . amiodarone (NEXTERONE PREMIX) 360 mg/200 mL dextrose 30 mg/hr (05/20/13 0800)    Jarold Motto MS, RD, LDN Pager: [phone] After-hours pager: [phone]

## 2013-05-20 NOTE — Progress Notes (Addendum)
ANTICOAGULATION CONSULT NOTE - Initial Consult  Pharmacy Consult for UFH Indication: atrial fibrillation and chronic dvt  No Known Allergies  Patient Measurements: Height: 6\' 1"  (185.4 cm) Weight: 275 lb 6.4 oz (124.921 kg) (bed weight) IBW/kg (Calculated) : 79.9 Heparin Dosing Weight: 107kg  Vital Signs: Temp: 97.7 F (36.5 C) (07/16 0742) Temp src: Oral (07/16 0742) BP: 106/55 mmHg (07/16 0900) Pulse Rate: 105 (07/16 0900)  Labs:  Recent Labs  05/18/13 0400 05/19/13 0500 05/20/13 0500  HGB 9.7*  --  8.3*  HCT 30.8*  --  25.2*  PLT 168  --  221  CREATININE 3.06* 4.35* 5.17*    Estimated Creatinine Clearance: 16.6 ml/min (by C-G formula based on Cr of 5.17).   Medical History: Past Medical History  Diagnosis Date  . Cancer   . Hypertension   . Collagen vascular disease   . Anginal pain   . Depression   . Arthritis     Medications:  Scheduled:  . ipratropium  0.5 mg Nebulization Q6H   And  . albuterol  2.5 mg Nebulization Q6H  . antiseptic oral rinse  15 mL Mouth Rinse BID  . chlorhexidine  15 mL Mouth/Throat BID  . darbepoetin (ARANESP) injection - NON-DIALYSIS  60 mcg Subcutaneous Q Tue-1800  . enoxaparin (LOVENOX) injection  30 mg Subcutaneous Q24H  . feeding supplement  1 Container Oral TID BM  . pantoprazole (PROTONIX) IV  40 mg Intravenous Q24H  . sodium chloride  3 mL Intravenous Q12H   Infusions:  . sodium chloride    . amiodarone (NEXTERONE PREMIX) 360 mg/200 mL dextrose 30 mg/hr (05/20/13 0900)    Assessment: 79 y/o male patient pod#7 s/p CABG, on chronic coumadin pta for afib and chronic popliteal DVT. Coumadin has been on hold since admission, currently receiving lovenox 30mg  daily. Now to begin heparin gtt for persistent afib. Will not provide bolus d/t recent surgery. Noted acute on chronic renal failure.  Goal of Therapy:  Heparin level 0.3-0.7 units/ml Monitor platelets by anticoagulation protocol: Yes   Plan:  D/c lovenox. Begin  heparn gtt at 1500 units/hr and check 8 hour heparin level with daily cbc and heparin levels.  Verlene Mayer, PharmD, BCPS Pager [phone] 05/20/2013,9:42 AM

## 2013-05-20 NOTE — Progress Notes (Signed)
7 Days Post-Op Procedure(s) (LRB): CORONARY ARTERY BYPASS GRAFTING (CABG) (N/A) Subjective: Lethargic but awakens and has no complaints  Objective: Vital signs in last 24 hours: Temp:  [97.5 F (36.4 C)-98.2 F (36.8 C)] 97.7 F (36.5 C) (07/16 0742) Pulse Rate:  [93-115] 109 (07/16 0700) Cardiac Rhythm:  [-] Atrial fibrillation (07/16 0800) Resp:  [11-21] 16 (07/16 0700) BP: (92-124)/(46-86) 100/74 mmHg (07/16 0700) SpO2:  [89 %-99 %] 99 % (07/16 0750) Weight:  [124.921 kg (275 lb 6.4 oz)] 124.921 kg (275 lb 6.4 oz) (07/16 0500)  Hemodynamic parameters for last 24 hours:    Intake/Output from previous day: 07/15 0701 - 07/16 0700 In: 1000.1 [I.V.:868.1; IV Piggyback:132] Out: 546 [Urine:545; Stool:1] Intake/Output this shift: Total I/O In: 40 [I.V.:40] Out: 35 [Urine:35]  General appearance: lethargic but appears comfortable Neurologic: intact Heart: irregularly irregular rhythm Lungs: diminished breath sounds bibasilar Abdomen: soft, non-tender; bowel sounds normal; no masses,  no organomegaly Extremities: edema mild Wound: incision ok  Lab Results:  Recent Labs  05/18/13 0400 05/20/13 0500  WBC 7.0 9.6  HGB 9.7* 8.3*  HCT 30.8* 25.2*  PLT 168 221   BMET:  Recent Labs  05/19/13 0500 05/20/13 0500  NA 141 142  K 3.9 3.7  CL 103 104  CO2 20 20  GLUCOSE 114* 123*  BUN 77* 93*  CREATININE 4.35* 5.17*  CALCIUM 6.9* 6.7*    PT/INR: No results found for this basename: LABPROT, INR,  in the last 72 hours ABG    Component Value Date/Time   PHART 7.343* 05/16/2013 0409   HCO3 20.3 05/16/2013 0409   TCO2 21 05/16/2013 0409   ACIDBASEDEF 5.0* 05/16/2013 0409   O2SAT 90.0 05/16/2013 0409   CBG (last 3)   Recent Labs  05/17/13 1159 05/17/13 1537 05/17/13 2008  GLUCAP 98 101* 96    Assessment/Plan: S/P Procedure(s) (LRB): CORONARY ARTERY BYPASS GRAFTING (CABG) (N/A)  Hemodynamically stable  A-fib with RVR on IV amio. Holding off on BB due to  marginal BP. Will start heparin drip since he is staying in a-fib most of the time. Will switch to coumadin eventually but will wait in case he needs any other procedures.  Acute on chronic renal failure: creat and BUN continue to rise. He is making about 20 cc per hour of urine. Discussed with nephrology and will give him another day before starting dialysis.  Status discussed with wife and son at bedside.   LOS: 8 days    BARTLE,BRYAN K 05/20/2013

## 2013-05-20 NOTE — Progress Notes (Signed)
ANTICOAGULATION CONSULT NOTE - Follow Up Consult  Pharmacy Consult for Heparin Indication: atrial fibrillation and chronic DVT  No Known Allergies  Patient Measurements: Height: 6\' 1"  (185.4 cm) Weight: 275 lb 6.4 oz (124.921 kg) (bed weight) IBW/kg (Calculated) : 79.9 Heparin Dosing Weight: 107 kg  Vital Signs: Temp: 97.5 F (36.4 C) (07/16 2002) Temp src: Oral (07/16 2002) BP: 112/61 mmHg (07/16 2000) Pulse Rate: 53 (07/16 2000)  Labs:  Recent Labs  05/18/13 0400 05/19/13 0500 05/20/13 0500 05/20/13 1816  HGB 9.7*  --  8.3*  --   HCT 30.8*  --  25.2*  --   PLT 168  --  221  --   HEPARINUNFRC  --   --   --  0.62  CREATININE 3.06* 4.35* 5.17*  --     Estimated Creatinine Clearance: 16.6 ml/min (by C-G formula based on Cr of 5.17).   Medications:  Infusions:  . sodium chloride    . amiodarone (NEXTERONE PREMIX) 360 mg/200 mL dextrose 30 mg/hr (05/20/13 2000)  . heparin 1,500 Units/hr (05/20/13 2000)    Assessment: 79 year old male POD #7 s/p CABG who was started on anticoagulation with Heparin for persistent atrial fibrillation and history of chronic DVT.  His initial heparin level is therapeutic.  Goal of Therapy:  Heparin level 0.3-0.7 units/ml Monitor platelets by anticoagulation protocol: Yes   Plan:  Continue Heparin at 1500 units/hr Recheck Heparin level and CBC with AM labs to confirm  Estella Husk, Pharm.D., BCPS, AAHIVP Clinical Pharmacist Phone: [phone] or [phone] 05/20/2013, 8:36 PM

## 2013-05-21 ENCOUNTER — Inpatient Hospital Stay (HOSPITAL_COMMUNITY): Payer: Medicare Other

## 2013-05-21 LAB — CBC
HCT: 25.3 % — ABNORMAL LOW (ref 39.0–52.0)
MCHC: 34 g/dL (ref 30.0–36.0)
MCV: 78.3 fL (ref 78.0–100.0)
Platelets: 271 10*3/uL (ref 150–400)
RDW: 18.3 % — ABNORMAL HIGH (ref 11.5–15.5)

## 2013-05-21 LAB — COMPREHENSIVE METABOLIC PANEL
Albumin: 2.3 g/dL — ABNORMAL LOW (ref 3.5–5.2)
Alkaline Phosphatase: 207 U/L — ABNORMAL HIGH (ref 39–117)
BUN: 107 mg/dL — ABNORMAL HIGH (ref 6–23)
CO2: 20 mEq/L (ref 19–32)
Chloride: 102 mEq/L (ref 96–112)
GFR calc non Af Amer: 8 mL/min — ABNORMAL LOW (ref >90)
Potassium: 3.9 mEq/L (ref 3.5–5.1)
Total Bilirubin: 2.7 mg/dL — ABNORMAL HIGH (ref 0.3–1.2)

## 2013-05-21 LAB — HEPARIN LEVEL (UNFRACTIONATED)
Heparin Unfractionated: 0.76 IU/mL — ABNORMAL HIGH (ref 0.30–0.70)
Heparin Unfractionated: 0.77 IU/mL — ABNORMAL HIGH (ref 0.30–0.70)

## 2013-05-21 LAB — POCT ACTIVATED CLOTTING TIME
Activated Clotting Time: 176 seconds
Activated Clotting Time: 227 seconds
Activated Clotting Time: 211 seconds

## 2013-05-21 MED ORDER — PRISMASOL BGK 4/2.5 32-4-2.5 MEQ/L IV SOLN
INTRAVENOUS | Status: DC
  Administered 2013-05-23 – 2013-05-24 (×3): via INTRAVENOUS_CENTRAL
  Filled 2013-05-21 (×9): qty 5000

## 2013-05-21 MED ORDER — SODIUM CHLORIDE 0.9 % FOR CRRT
INTRAVENOUS_CENTRAL | Status: DC | PRN
  Administered 2013-05-24: 1000 mL via INTRAVENOUS_CENTRAL

## 2013-05-21 MED ORDER — HEPARIN SODIUM (PORCINE) 1000 UNIT/ML DIALYSIS
1000.0000 [IU] | INTRAMUSCULAR | Status: DC | PRN
  Administered 2013-05-21 (×3): 1000 [IU] via INTRAVENOUS_CENTRAL
  Administered 2013-05-24: 2800 [IU] via INTRAVENOUS_CENTRAL
  Filled 2013-05-21: qty 2
  Filled 2013-05-21: qty 1

## 2013-05-21 MED ORDER — LEVOTHYROXINE SODIUM 100 MCG IV SOLR
25.0000 ug | Freq: Every day | INTRAVENOUS | Status: DC
  Administered 2013-05-21 – 2013-05-26 (×6): 25 ug via INTRAVENOUS
  Filled 2013-05-21 (×8): qty 5

## 2013-05-21 MED ORDER — HEPARIN (PORCINE) IN NACL 100-0.45 UNIT/ML-% IJ SOLN
1300.0000 [IU]/h | INTRAMUSCULAR | Status: DC
  Administered 2013-05-22 – 2013-05-24 (×3): 1200 [IU]/h via INTRAVENOUS
  Administered 2013-05-26: 1300 [IU]/h via INTRAVENOUS
  Filled 2013-05-21 (×8): qty 250

## 2013-05-21 MED ORDER — SODIUM CHLORIDE 0.9 % IJ SOLN
250.0000 [IU]/h | INTRAMUSCULAR | Status: DC
  Administered 2013-05-21: 500 [IU]/h via INTRAVENOUS_CENTRAL
  Filled 2013-05-21: qty 2

## 2013-05-21 MED ORDER — PRISMASOL BGK 4/2.5 32-4-2.5 MEQ/L IV SOLN
INTRAVENOUS | Status: DC
  Administered 2013-05-21 – 2013-05-24 (×22): via INTRAVENOUS_CENTRAL
  Filled 2013-05-21 (×41): qty 5000

## 2013-05-21 MED ORDER — HEPARIN BOLUS VIA INFUSION (CRRT): 1000.0000 [IU] | INTRAVENOUS | Status: DC | PRN

## 2013-05-21 MED ORDER — PRISMASOL BGK 4/2.5 32-4-2.5 MEQ/L IV SOLN
INTRAVENOUS | Status: DC
  Administered 2013-05-22 – 2013-05-24 (×5): via INTRAVENOUS_CENTRAL
  Filled 2013-05-21 (×9): qty 5000

## 2013-05-21 NOTE — Progress Notes (Signed)
ANTICOAGULATION CONSULT NOTE - Follow Up Consult  Pharmacy Consult for Heparin Indication: atrial fibrillation and chronic DVT  No Known Allergies  Patient Measurements: Height: 6\' 1"  (185.4 cm) Weight: 268 lb 15.4 oz (122 kg) IBW/kg (Calculated) : 79.9 Heparin Dosing Weight: 107 kg  Vital Signs: Temp: 97.7 F (36.5 C) (07/17 1205) Temp src: Oral (07/17 1205) BP: 113/65 mmHg (07/17 1300) Pulse Rate: 112 (07/17 1300)  Labs:  Recent Labs  05/19/13 0500 05/20/13 0500 05/20/13 1816 05/21/13 0335 05/21/13 0550  HGB  --  8.3*  --  8.6*  --   HCT  --  25.2*  --  25.3*  --   PLT  --  221  --  271  --   HEPARINUNFRC  --   --  0.62  --  0.77*  CREATININE 4.35* 5.17*  --  5.93*  --     Estimated Creatinine Clearance: 14.3 ml/min (by C-G formula based on Cr of 5.93).   Medications:  Infusions:  . sodium chloride    . amiodarone (NEXTERONE PREMIX) 360 mg/200 mL dextrose 30 mg/hr (05/21/13 1300)  . heparin 10,000 units/ 20 mL infusion syringe    . heparin Stopped (05/21/13 0900)  . dialysis replacement fluid (prismasate)    . dialysis replacement fluid (prismasate)    . dialysate [facility])      Assessment: 79 year old male POD #8 s/p CABG who was started on anticoagulation with Heparin for persistent atrial fibrillation and history of chronic DVT.  Heparin level was slightly SUPRAtherapeutic this morning (HL 0.77 << 0.62, goal of 0.3-0.7) with heparin at a rate of 1500 units/hr. Hgb/Hct/Plt tending up, no s/sx of bleeding noted at this time.   Per discussion with the RN -- heparin was turned off around 0800 for central line placement for CRRT which was just completed.  Pharmacy asked to resume heparin at 4pm today with NO BOLUS.  Of note, patient will now be receiving heparin with CRRT as well.  Goal of Therapy:  Heparin level 0.3-0.7 units/ml Monitor platelets by anticoagulation protocol: Yes   Plan:  - Resume heparin at decreased rate of 1350 units/hr at 4pm (NO  BOLUS) - Check heparin level 8h after heparin starts (due at 0000) - Check daily heparin level and CBC  Trishia Cuthrell L. Illene Bolus, PharmD, BCPS Clinical Pharmacist Pager: [phone] Pharmacy: [phone] 05/21/2013 3:03 PM

## 2013-05-21 NOTE — Progress Notes (Signed)
Chart review complete.  Patient is not eligible for [facility] Care Management services because his/her PCP is not a [facility] primary care provider or is not [facility] affiliated.  For any additional questions or new referrals please contact Tim Henderson BSN RN [facility] Liaison at [phone] °

## 2013-05-21 NOTE — Progress Notes (Signed)
8 Days Post-Op Procedure(s) (LRB): CORONARY ARTERY BYPASS GRAFTING (CABG) (N/A) Subjective: No complaints  Objective: Vital signs in last 24 hours: Temp:  [97 F (36.1 C)-98.2 F (36.8 C)] 98.2 F (36.8 C) (07/17 1509) Pulse Rate:  [25-115] 66 (07/17 1700) Cardiac Rhythm:  [-] Normal sinus rhythm (07/17 1700) Resp:  [10-24] 19 (07/17 1700) BP: (93-130)/(49-85) 103/56 mmHg (07/17 1700) SpO2:  [90 %-100 %] 96 % (07/17 1700) Weight:  [122 kg (268 lb 15.4 oz)] 122 kg (268 lb 15.4 oz) (07/17 0600)  Hemodynamic parameters for last 24 hours:    Intake/Output from previous day: 07/16 0701 - 07/17 0700 In: 3506.3 [I.V.:3506.3] Out: 355 [Urine:355] Intake/Output this shift: Total I/O In: 236.5 [I.V.:236.5] Out: 40 [Urine:40]  General appearance: lethargic but awakens and follows commands Neurologic: intact Heart: regular rate and rhythm, S1, S2 normal, no murmur, click, rub or gallop Lungs: clear to auscultation bilaterally Abdomen: soft, non-tender; bowel sounds normal; no masses,  no organomegaly Extremities: edema moderate Wound: incisions ok  Lab Results:  Recent Labs  05/20/13 0500 05/21/13 0335  WBC 9.6 13.7*  HGB 8.3* 8.6*  HCT 25.2* 25.3*  PLT 221 271   BMET:  Recent Labs  05/20/13 0500 05/21/13 0335  NA 142 140  K 3.7 3.9  CL 104 102  CO2 20 20  GLUCOSE 123* 119*  BUN 93* 107*  CREATININE 5.17* 5.93*  CALCIUM 6.7* 6.7*    PT/INR: No results found for this basename: LABPROT, INR,  in the last 72 hours ABG    Component Value Date/Time   PHART 7.343* 05/16/2013 0409   HCO3 20.3 05/16/2013 0409   TCO2 21 05/16/2013 0409   ACIDBASEDEF 5.0* 05/16/2013 0409   O2SAT 90.0 05/16/2013 0409   CBG (last 3)  No results found for this basename: GLUCAP,  in the last 72 hours  TSH 05/12/13 was 10.8. It was 3.6 in 02/2011. He has not been treated.  Assessment/Plan: S/P Procedure(s) (LRB): CORONARY ARTERY BYPASS GRAFTING (CABG) (N/A) He is hemodynamically  stable  Postop A-fib; now in sinus 68 this evening on IV amio. He is on heparin drip and will plan to switch to coumadin as he improves.  Acute on Chronic renal failure: BUN and creat still rising. He is still making some urine. CRRT to start this pm.  Hypothyroidism: untreated. Will start IV synthroid until taking po well. He will need TSH followed up as outpatient.   LOS: 9 days    BARTLE,BRYAN K 05/21/2013

## 2013-05-21 NOTE — Progress Notes (Signed)
Subjective:  Unfortunately UOP marginal overnight again.   Pt a little more alert but still not where he was- BP remains lowish Objective Vital signs in last 24 hours: Filed Vitals:   05/21/13 0400 05/21/13 0500 05/21/13 0600 05/21/13 0700  BP: 116/67 118/68 107/62 93/76  Pulse:    108  Temp:      TempSrc:      Resp: 16 13 15 17   Height:      Weight:   122 kg (268 lb 15.4 oz)   SpO2: 95% 96% 95% 99%   Weight change: -2.921 kg (-6 lb 7 oz)  Intake/Output Summary (Last 24 hours) at 05/21/13 0721 Last data filed at 05/21/13 0700  Gross per 24 hour  Intake 3506.25 ml  Output    355 ml  Net 3151.25 ml   Labs: Basic Metabolic Panel:  Recent Labs Lab 05/15/13 0350 05/16/13 0400  05/19/13 0500 05/20/13 0500 05/21/13 0335  NA 139 138  < > 141 142 140  K 4.4 3.7  < > 3.9 3.7 3.9  CL 108 107  < > 103 104 102  CO2 21 22  < > 20 20 20   GLUCOSE 165* 128*  < > 114* 123* 119*  BUN 44* 43*  < > 77* 93* 107*  CREATININE 2.47* 2.27*  < > 4.35* 5.17* 5.93*  CALCIUM 7.3* 6.9*  < > 6.9* 6.7* 6.7*  PHOS 4.8* 3.9  --   --  6.5*  --   < > = values in this interval not displayed. Liver Function Tests:  Recent Labs Lab 05/19/13 0500 05/20/13 0500 05/21/13 0335  AST 50*  --  77*  ALT 38  --  36  ALKPHOS 107  --  207*  BILITOT 2.5*  --  2.7*  PROT 6.0  --  6.5  ALBUMIN 2.4* 2.4* 2.3*   No results found for this basename: LIPASE, AMYLASE,  in the last 168 hours No results found for this basename: AMMONIA,  in the last 168 hours CBC:  Recent Labs Lab 05/16/13 0400 05/17/13 0350 05/18/13 0400 05/20/13 0500 05/21/13 0335  WBC 8.6 9.1 7.0 9.6 13.7*  HGB 9.7* 9.1* 9.7* 8.3* 8.6*  HCT 30.5* 28.2* 30.8* 25.2* 25.3*  MCV 82.0 81.7 82.8 80.3 78.3  PLT 106* 114* 168 221 271   Cardiac Enzymes: No results found for this basename: CKTOTAL, CKMB, CKMBINDEX, TROPONINI,  in the last 168 hours CBG:  Recent Labs Lab 05/17/13 0348 05/17/13 0721 05/17/13 1159 05/17/13 1537  05/17/13 2008  GLUCAP 101* 105* 98 101* 96    Iron Studies: No results found for this basename: IRON, TIBC, TRANSFERRIN, FERRITIN,  in the last 72 hours Studies/Results: Dg Chest Port 1 View  05/19/2013   *RADIOLOGY REPORT*  Clinical Data: Status post right subclavian central line.  PORTABLE CHEST - 1 VIEW  Comparison: 05/17/2013  Findings: Right subclavian central line is in place.  The catheter tip is in the region of the lower SVC.  No evidence for a pneumothorax.  Again noted is a right jugular central line which is unchanged in position.  Heart size remains enlarged.  Diffuse interstitial densities may represent edema.  Median sternotomy wires are present.  IMPRESSION: Right subclavian central line tip is in the lower SVC region.  No evidence for a pneumothorax.  Diffuse interstitial lung densities are most compatible with edema.  Cardiomegaly.   Original Report Authenticated By: Richarda Overlie, M.D.   Medications: Infusions: . sodium chloride    .  amiodarone (NEXTERONE PREMIX) 360 mg/200 mL dextrose 30 mg/hr (05/21/13 0700)  . heparin 1,500 Units/hr (05/21/13 0700)    Scheduled Medications: . ipratropium  0.5 mg Nebulization Q6H   And  . albuterol  2.5 mg Nebulization Q6H  . antiseptic oral rinse  15 mL Mouth Rinse BID  . chlorhexidine  15 mL Mouth/Throat BID  . darbepoetin (ARANESP) injection - NON-DIALYSIS  60 mcg Subcutaneous Q Tue-1800  . feeding supplement  1 Container Oral TID BM  . pantoprazole (PROTONIX) IV  40 mg Intravenous Q24H  . sodium chloride  3 mL Intravenous Q12H    have reviewed scheduled and prn medications.  Physical Exam: General: in bed- arousable to stimulation- probably not comprehending what I am saying to him "I feel terrible" Heart: RRR Lungs: mostly clear Abdomen: distended Extremities: pitting edema   Assessment/Plan:79 year old WM with multiple medical problems including CKD who now has A on CRF in the setting of emergent cath/CABG and hemodynamic  instability  1.Renal- A on CRF in the setting of above. Unfortunately UOP is still marginal.  Lopressor has been stopped to try and get better renal perfusion.  He is now to a point where his uremia is likely affecting things and I think it may be best to initiate RRT.   He will need a dialysis line (will talk to CTS) and I think since he is post op and in Afib with an already tenous BP with fluid to take off that he would be better served by CRRT.   Will look to update family later today- I called- no answer- they may be on their way here.   2. Hypertension/volume - is volume overloaded- UF as able with CRRT 3. Anemia - hgb 8.6 -  down for situation. Have added aranesp and follow- transfuse as needed.  4. Elytes- pretty stable 5. Metabolic acidosis- not much of an issue, will be able to use prismasate.    [name] A   05/21/2013,7:21 AM  LOS: 9 days

## 2013-05-21 NOTE — Progress Notes (Signed)
ANTICOAGULATION CONSULT NOTE - Follow Up Consult  Pharmacy Consult for Heparin Indication: atrial fibrillation and chronic DVT  No Known Allergies  Patient Measurements: Height: 6\' 1"  (185.4 cm) Weight: 268 lb 15.4 oz (122 kg) IBW/kg (Calculated) : 79.9 Heparin Dosing Weight: 107 kg  Vital Signs: Temp: 97.8 F (36.6 C) (07/17 0800) Temp src: Oral (07/17 0800) BP: 115/50 mmHg (07/17 0800) Pulse Rate: 25 (07/17 0800)  Labs:  Recent Labs  05/19/13 0500 05/20/13 0500 05/20/13 1816 05/21/13 0335 05/21/13 0550  HGB  --  8.3*  --  8.6*  --   HCT  --  25.2*  --  25.3*  --   PLT  --  221  --  271  --   HEPARINUNFRC  --   --  0.62  --  0.77*  CREATININE 4.35* 5.17*  --  5.93*  --     Estimated Creatinine Clearance: 14.3 ml/min (by C-G formula based on Cr of 5.93).   Medications:  Infusions:  . sodium chloride    . amiodarone (NEXTERONE PREMIX) 360 mg/200 mL dextrose 30 mg/hr (05/21/13 0800)  . heparin 10,000 units/ 20 mL infusion syringe    . heparin 1,500 Units/hr (05/21/13 0800)  . dialysis replacement fluid (prismasate)    . dialysis replacement fluid (prismasate)    . dialysate [facility])      Assessment: 79 year old male POD #8 s/p CABG who was started on anticoagulation with Heparin for persistent atrial fibrillation and history of chronic DVT.  Heparin level is slightly SUPRAtherapeutic this morning (HL 0.77 << 0.62, goal of 0.3-0.7). Hgb/Hct/Plt tending up, no s/sx of bleeding noted at this time.   Per discussion with the RN -- heparin was turned off around 0800 for line placement for CRRT planned to be around 1100 today. Will f/u after this procedure plans for restarting heparin -- will plan to reduce the rate of the drip at that time.   Goal of Therapy:  Heparin level 0.3-0.7 units/ml Monitor platelets by anticoagulation protocol: Yes   Plan:  1. Heparin on hold currently for line placement 2. Will f/u plans to resume post-procedure today.  Georgina Pillion, PharmD, BCPS Clinical Pharmacist Pager: [phone] 05/21/2013 8:30 AM

## 2013-05-21 NOTE — Procedures (Signed)
Central Venous Dialysis Catheter Insertion Procedure Note TEOFILO LUPINACCI [id number] 16-Mar-1934  Procedure: Insertion of Central Venous Dialysis Catheter Indications: Hemodialysis  Procedure Details Consent: Risks of procedure as well as the alternatives and risks of each were explained to the (patient/caregiver).  Consent for procedure obtained. Time Out: Verified patient identification, verified procedure, site/side was marked, verified correct patient position, special equipment/implants available, medications/allergies/relevent history reviewed, required imaging and test results available.  Performed  Maximum sterile technique was used including antiseptics, cap, gloves, gown, hand hygiene, mask and sheet. Skin prep: Chlorhexidine; local anesthetic administered A antimicrobial bonded/coated triple lumen catheter was placed in the left internal jugular vein using the Seldinger technique.  Evaluation Blood flow good Complications: No apparent complications Patient did tolerate procedure well. Chest X-ray ordered to verify placement.  CXR: pending.  Reynold Bowen 05/21/2013, 2:56 PM  Levy Pupa, MD, PhD 05/21/2013, 5:47 PM [facility] [phone] or if no answer [phone]

## 2013-05-21 NOTE — Progress Notes (Signed)
Physical Therapy Treatment Patient Details Name: MUSSA GROESBECK MRN: [id number] DOB: 13-Jul-1934 Today's Date: 05/21/2013 Time: 0737-0800 PT Time Calculation (min): 23 min  PT Assessment / Plan / Recommendation  PT Comments   Pt progressing with gait but remains limited by lethargy and confusion with plans to initiate CVVHD today which will also limit mobility. Will continue to follow acutely to maximize function. Pt educated again for all precautions as he was unable to recall them today.   Follow Up Recommendations        Does the patient have the potential to tolerate intense rehabilitation     Barriers to Discharge        Equipment Recommendations       Recommendations for Other Services    Frequency     Progress towards PT Goals Progress towards PT goals: Progressing toward goals  Plan Current plan remains appropriate    Precautions / Restrictions Precautions Precautions: Sternal;Fall Restrictions Weight Bearing Restrictions: No   Pertinent Vitals/Pain Pt reported nausea with ambulation and mild chest pain but unable to rate HR 115-127 with afib throughout Unable to obtain accurate pulse ox reading with activity, 95% at rest on 2L    Mobility  Bed Mobility Bed Mobility: Sit to Supine Sit to Supine: 1: +2 Total assist;HOB flat Sit to Supine: Patient Percentage: 60% Details for Bed Mobility Assistance: cueing for sequence and safety with assist to elevate bil LE onto surface Transfers Sit to Stand: 1: +2 Total assist;From chair/3-in-1 Sit to Stand: Patient Percentage: 70% Stand to Sit: To bed;4: Min assist Details for Transfer Assistance: cueing for hand placement, sequence and posture with 2 attempts from chair prior to achieving fully upright Ambulation/Gait Ambulation/Gait Assistance: 4: Min assist Ambulation Distance (Feet): 52 Feet Assistive device: Rolling walker Ambulation/Gait Assistance Details: cuieng for upright posture, position in RW and encouragement  to progress Gait Pattern: Shuffle;Trunk flexed Gait velocity: decreased    Exercises General Exercises - Lower Extremity Short Arc Quad: AROM;Both;15 reps;Supine Heel Slides: AAROM;Both;15 reps;Supine   PT Diagnosis:    PT Problem List:   PT Treatment Interventions:     PT Goals (current goals can now be found in the care plan section)    Visit Information  Last PT Received On: 05/21/13 Assistance Needed: +2 (safety for gait) History of Present Illness: Pt transferred from [facility] with NSTEMI s/p CABG with elevated creatinine and lethargy    Subjective Data      Cognition  Cognition Arousal/Alertness: Lethargic Behavior During Therapy: Flat affect Overall Cognitive Status: Impaired/Different from baseline Area of Impairment: Orientation;Attention Orientation Level: Time;Disoriented to Current Attention Level: Sustained Memory: Decreased recall of precautions;Decreased short-term memory General Comments: improved alertness from eval but still difficulty with orientation and maintaining arousal    Balance     End of Session PT - End of Session Equipment Utilized During Treatment: Gait belt;Oxygen Activity Tolerance: Patient limited by fatigue;Patient limited by lethargy Patient left: in bed;with call bell/phone within reach Nurse Communication: Mobility status   GP     Toney Sang [facility] 05/21/2013, 10:32 AM Delaney Meigs, PT [phone]

## 2013-05-21 NOTE — Progress Notes (Signed)
TCTS BRIEF SICU PROGRESS NOTE  8 Days Post-Op  S/P Procedure(s) (LRB): CORONARY ARTERY BYPASS GRAFTING (CABG) (N/A)   Stable day AAI paced w/ stable BP Starting CRRT  Plan: Continue current plan  Gwynn Chalker H 05/21/2013 6:10 PM

## 2013-05-22 LAB — RENAL FUNCTION PANEL
BUN: 89 mg/dL — ABNORMAL HIGH (ref 6–23)
CO2: 23 mEq/L (ref 19–32)
CO2: 24 mEq/L (ref 19–32)
Calcium: 7.4 mg/dL — ABNORMAL LOW (ref 8.4–10.5)
Calcium: 7.7 mg/dL — ABNORMAL LOW (ref 8.4–10.5)
Chloride: 102 mEq/L (ref 96–112)
GFR calc Af Amer: 15 mL/min — ABNORMAL LOW (ref >90)
Glucose, Bld: 109 mg/dL — ABNORMAL HIGH (ref 70–99)
Glucose, Bld: 108 mg/dL — ABNORMAL HIGH (ref 70–99)
Phosphorus: 5.2 mg/dL — ABNORMAL HIGH (ref 2.3–4.6)
Potassium: 4.1 mEq/L (ref 3.5–5.1)
Sodium: 140 mEq/L (ref 135–145)

## 2013-05-22 LAB — CBC
HCT: 24.3 % — ABNORMAL LOW (ref 39.0–52.0)
Hemoglobin: 8.5 g/dL — ABNORMAL LOW (ref 13.0–17.0)
MCH: 27.1 pg (ref 26.0–34.0)
RDW: 18.3 % — ABNORMAL HIGH (ref 11.5–15.5)
WBC: 16.8 10*3/uL — ABNORMAL HIGH (ref 4.0–10.5)

## 2013-05-22 LAB — POCT ACTIVATED CLOTTING TIME
Activated Clotting Time: 211 seconds
Activated Clotting Time: 217 seconds
Activated Clotting Time: 227 seconds
Activated Clotting Time: 217 seconds
Activated Clotting Time: 222 seconds
Activated Clotting Time: 222 seconds
Activated Clotting Time: 222 seconds

## 2013-05-22 LAB — HEPARIN LEVEL (UNFRACTIONATED): Heparin Unfractionated: 0.56 IU/mL (ref 0.30–0.70)

## 2013-05-22 LAB — APTT: aPTT: >200 seconds (ref 24–37)

## 2013-05-22 MED ORDER — ACETAMINOPHEN 325 MG PO TABS
650.0000 mg | ORAL_TABLET | ORAL | Status: DC | PRN
  Administered 2013-05-22 – 2013-05-27 (×7): 650 mg via ORAL
  Filled 2013-05-22 (×7): qty 2

## 2013-05-22 MED ORDER — STARCH (THICKENING) PO POWD
ORAL | Status: DC | PRN
  Filled 2013-05-22: qty 227

## 2013-05-22 NOTE — Progress Notes (Signed)
C/o pain from foley catheter  BP 100/58  Pulse 111  Temp(Src) 98.4 F (36.9 C) (Core (Comment))  Resp 16  Ht 6\' 1"  (1.854 m)  Wt 268 lb 15.4 oz (122 kg)  BMI 35.49 kg/m2  SpO2 95%  In a fib   Intake/Output Summary (Last 24 hours) at 05/22/13 1810 Last data filed at 05/22/13 1700  Gross per 24 hour  Intake  854.6 ml  Output   2608 ml  Net -1753.4 ml   CVVH removing volume well Continue present care

## 2013-05-22 NOTE — Progress Notes (Signed)
ANTICOAGULATION CONSULT NOTE - Follow Up Consult  Pharmacy Consult for heparin Indication: atrial fibrillation and DVT  Labs:  Recent Labs  05/19/13 0500 05/20/13 0500  05/21/13 05/21/13 0335 05/21/13 0550 05/22/13 0053  HGB  --  8.3*  --   --  8.6*  --   --   HCT  --  25.2*  --   --  25.3*  --   --   PLT  --  221  --   --  271  --   --   HEPARINUNFRC  --   --   < > 0.76*  --  0.77* 0.77*  CREATININE 4.35* 5.17*  --   --  5.93*  --   --   < > = values in this interval not displayed.   Assessment: 79yo male slightly supratherapeutic on heparin after resumed post central line placement; per RN heparin requirements with CRRT has been variable.  Goal of Therapy:  Heparin level 0.3-0.7 units/ml   Plan:  Will decrease heparin gtt by ~1 unit/kg/hr to 1200 units/hr and check level in 8hr.  Vernard Gambles, PharmD, BCPS  05/22/2013,1:32 AM

## 2013-05-22 NOTE — Evaluation (Signed)
Clinical/Bedside Swallow Evaluation Patient Details  Name: [name] MRN: [id number] Date of Birth: 10-16-1934  Today's Date: 05/22/2013 Time: 1102-1120 SLP Time Calculation (min): 18 min  Past Medical History:  Past Medical History  Diagnosis Date  . Cancer   . Hypertension   . Collagen vascular disease   . Anginal pain   . Depression   . Arthritis    Past Surgical History:  Past Surgical History  Procedure Laterality Date  . Eye surgery    . Coronary artery bypass graft N/A 05/13/2013    Procedure: CORONARY ARTERY BYPASS GRAFTING (CABG);  Surgeon: Alleen Borne, MD;  Location: [facility] OR;  Service: Open Heart Surgery;  Laterality: N/A;   HPI:  The patient is a 79 year old male with no known history of coronary artery disease who suffered an MI.  Underwent CABG 05/13/13.  PMH:  atrial flutter, lower extremity deep venous thrombosis, chronic kidney disease, GERD, prostate CA, cervical spine compression fx.. A recent follow up duplex imaging study, performed 04/23/2013 indicated chronic popliteal vein thrombus, which had improved.  He is coughing after po ice chips so will ask speech therapy to do swallow eval.   Assessment / Plan / Recommendation Clinical Impression  Bedside swallow assessment completed.  +s/s aspiration present such as mildly delayed cough after both ice chip and teaspoon size water.  Given clinical findings during assessment and report or coughing with ice chip with RN yesterday, recommend a FEES.  FEES will be performed today at 1400.    Aspiration Risk  Severe    Diet Recommendation NPO        Other  Recommendations Recommended Consults: FEES   Follow Up Recommendations   (TBD)    Frequency and Duration        Pertinent Vitals/Pain none          Swallow Study         Oral/Motor/Sensory Function Overall Oral Motor/Sensory Function: Appears within functional limits for tasks assessed   Ice Chips Ice chips: Impaired Presentation:  Spoon Pharyngeal Phase Impairments: Cough - Delayed   Thin Liquid Thin Liquid: Impaired Presentation: Spoon Pharyngeal  Phase Impairments: Cough - Delayed    Nectar Thick Nectar Thick Liquid: Not tested   Honey Thick Honey Thick Liquid: Not tested   Puree Puree: Within functional limits   Solid   GO    Solid: Not tested       [name] M.Ed [employer] [phone]  05/22/2013

## 2013-05-22 NOTE — Progress Notes (Signed)
Dr. Kathrene Bongo notified of patient's hourly ACT numbers and she stated that ACT did not need to be checked again and heparin syringe could remain off well CRRT pump was working well.

## 2013-05-22 NOTE — Progress Notes (Signed)
CRITICAL VALUE ALERT  Critical value received:  PTT >200  Date of notification:  05/22/13  Time of notification:  0449  Critical value read back:yes  Nurse who received alert:  Okey Dupre, RN  MD notified (1st page):  [facility]  Time of first page:  [phone]  Responding MD:  Dr. Eliott Nine  Time MD responded:  [phone]

## 2013-05-22 NOTE — Progress Notes (Signed)
Pt rhythm change to A-fib, HR ranging from 103-118. Amio infusing at 30mg /hr. MD Bartle notified. No new orders at this time. Will cont to monitor pt. [name]

## 2013-05-22 NOTE — Progress Notes (Signed)
ANTICOAGULATION CONSULT NOTE - Follow Up Consult  Pharmacy Consult for Heparin Indication: atrial fibrillation and chronic DVT  No Known Allergies  Patient Measurements: Height: 6\' 1"  (185.4 cm) Weight: 268 lb 15.4 oz (122 kg) IBW/kg (Calculated) : 79.9 Heparin Dosing Weight: 107 kg  Vital Signs: Temp: 97.5 F (36.4 C) (07/18 1100) Temp src: Core (Comment) (07/18 0730) BP: 133/70 mmHg (07/18 1100) Pulse Rate: 79 (07/18 1100)  Labs:  Recent Labs  05/20/13 0500  05/21/13 0335 05/21/13 0550 05/22/13 0053 05/22/13 0418 05/22/13 0930  HGB 8.3*  --  8.6*  --   --  8.5*  --   HCT 25.2*  --  25.3*  --   --  24.3*  --   PLT 221  --  271  --   --  284  --   APTT  --   --   --   --   --  >200*  --   HEPARINUNFRC  --   < >  --  0.77* 0.77*  --  0.56  CREATININE 5.17*  --  5.93*  --   --  4.96*  --   < > = values in this interval not displayed.  Estimated Creatinine Clearance: 17.1 ml/min (by C-G formula based on Cr of 4.96).   Medications:  Infusions:  . sodium chloride    . amiodarone (NEXTERONE PREMIX) 360 mg/200 mL dextrose 30 mg/hr (05/22/13 0530)  . heparin 10,000 units/ 20 mL infusion syringe Stopped (05/22/13 0518)  . heparin 1,200 Units/hr (05/22/13 0200)  . dialysis replacement fluid (prismasate)    . dialysis replacement fluid (prismasate)    . dialysate (PRISMASATE) 1,500 mL/hr at 05/22/13 1610    Assessment: 79 year old male POD #9 s/p CABG who was started on anticoagulation with Heparin for persistent atrial fibrillation and history of chronic DVT.  Heparin level is therapeutic at 0.56. CBC stable, no s/sx of bleeding noted at this time. RN reports that heparin not needed for CRRT as ACT is therapeutic.   Goal of Therapy:  Heparin level 0.3-0.7 units/ml Monitor platelets by anticoagulation protocol: Yes   Plan:  - Continue heparin at 1200 units/hr - Check daily heparin level and CBC  Celedonio Miyamoto, PharmD, [facility] Clinical Pharmacist Pager [phone]    05/22/2013 11:48 AM

## 2013-05-22 NOTE — Progress Notes (Signed)
NUTRITION FOLLOW UP  Intervention:   Lexicographer. Will reorder oral nutrition supplements if/when diet advanced. If unable to pass swallow evaluation - strongly recommend initiation of nutrition support 2/2 prolonged NPO/Clear Liquid status (x 9 days) and to meet increased nutrient needs. If enteral nutrition warranted, recommend initiation of Vital AF 1.2 via enteral feeding tube at 20 ml/h, advance by 10 ml q 8 hours, to goal of 70 ml/h. Add 30 ml Prostat liquid protein via tube BID. Goal regimen will provide: 2216 kcal, 156 grams protein, 1362 ml free water. RD to continue to follow nutrition care plan.  Nutrition Dx:   Inadequate oral intake related to need for slow diet advancement AEB clear liquid diet order. Ongoing.  New Goal:   Intake to meet >90% of estimated nutrition needs. Unmet.  Monitor:   weight trends, labs, I/O's, po intake, bowel function, diet advancement  Assessment:   Pt transferred from [facility] related to STEMI. Pt required emergent intubation related to acute respiratory failure in the setting of ischemia. S/p cardiac cath and CABG on 7/9.  Extubated 7/12 - pt did not receive enteral nutrition during intubation; pt has been either NPO or ordered for Clear Liquids x 9 days. CRRT initiated 7/17. UOP is poor - only 90 ml out yesterday.  Pt noted to be coughing with ice chips on 7/17 and SLP eval ordered. SLP saw pt earlier today and recommending NPO status, plan for FEES at 1400 today.  Labs reviewed: sodium and potassium WNL. Phosphorus trending down, most recent is 5.2. Blood sugars ranging from 109 - 128.  Height: Ht Readings from Last 1 Encounters:  05/12/13 6\' 1"  (1.854 m)    Weight Status:   Wt Readings from Last 1 Encounters:  05/22/13 268 lb 15.4 oz (122 kg)  Admit wt: 243 lb (UBW: 235 - 245 lb) Wt trending back down to admit wt with CRRT; + 11.7 L since admission  Body mass index is 35.49 kg/(m^2). Obese Class  II.  Re-estimated needs:  Kcal: 2000 - 2300 Protein: 155 - 175 g Fluid: per team  Skin:  Chest incision R leg incision L leg incision L tibial wound Moderate edema  Diet Order: NPO   Intake/Output Summary (Last 24 hours) at 05/22/13 1047 Last data filed at 05/22/13 1000  Gross per 24 hour  Intake  935.6 ml  Output   1748 ml  Net -812.4 ml    Last BM: 7/15   Labs:   Recent Labs Lab 05/16/13 0400  05/20/13 0500 05/21/13 0335 05/22/13 0418  NA 138  < > 142 140 140  K 3.7  < > 3.7 3.9 4.1  CL 107  < > 104 102 102  CO2 22  < > 20 20 23   BUN 43*  < > 93* 107* 89*  CREATININE 2.27*  < > 5.17* 5.93* 4.96*  CALCIUM 6.9*  < > 6.7* 6.7* 7.4*  MG 2.5  --   --   --  3.3*  PHOS 3.9  --  6.5*  --  5.2*  GLUCOSE 128*  < > 123* 119* 109*  < > = values in this interval not displayed.  CBG (last 3)  No results found for this basename: GLUCAP,  in the last 72 hours  Scheduled Meds: . ipratropium  0.5 mg Nebulization Q6H   And  . albuterol  2.5 mg Nebulization Q6H  . antiseptic oral rinse  15 mL Mouth Rinse BID  . chlorhexidine  15  mL Mouth/Throat BID  . darbepoetin (ARANESP) injection - NON-DIALYSIS  60 mcg Subcutaneous Q Tue-1800  . feeding supplement  1 Container Oral TID BM  . levothyroxine  25 mcg Intravenous Daily  . pantoprazole (PROTONIX) IV  40 mg Intravenous Q24H    Continuous Infusions: . sodium chloride    . amiodarone (NEXTERONE PREMIX) 360 mg/200 mL dextrose 30 mg/hr (05/22/13 0530)  . heparin 10,000 units/ 20 mL infusion syringe Stopped (05/22/13 0518)  . heparin 1,200 Units/hr (05/22/13 0200)  . dialysis replacement fluid (prismasate)    . dialysis replacement fluid (prismasate)    . dialysate (PRISMASATE) 1,500 mL/hr at 05/22/13 1610    Jarold Motto MS, RD, LDN Pager: [phone] After-hours pager: [phone]

## 2013-05-22 NOTE — Progress Notes (Signed)
9 Days Post-Op Procedure(s) (LRB): CORONARY ARTERY BYPASS GRAFTING (CABG) (N/A) Subjective: No complaints  Objective: Vital signs in last 24 hours: Temp:  [97.3 F (36.3 C)-98.2 F (36.8 C)] 97.5 F (36.4 C) (07/18 1000) Pulse Rate:  [66-112] 81 (07/18 1000) Cardiac Rhythm:  [-] Atrial paced (07/18 0730) Resp:  [15-24] 17 (07/18 1000) BP: (103-143)/(49-85) 130/51 mmHg (07/18 1000) SpO2:  [90 %-97 %] 94 % (07/18 1000) Weight:  [122 kg (268 lb 15.4 oz)] 122 kg (268 lb 15.4 oz) (07/18 0500)  Hemodynamic parameters for last 24 hours:    Intake/Output from previous day: 07/17 0701 - 07/18 0700 In: 920.5 [P.O.:120; I.V.:800.5] Out: 1382 [Urine:90] Intake/Output this shift: Total I/O In: 73.4 [I.V.:73.4] Out: 386 [Urine:5; Other:381]  General appearance: more alert today Neurologic: intact Heart: regular rate and rhythm, S1, S2 normal, no murmur, click, rub or gallop Lungs: rales bilaterally Abdomen: soft, non-tender; bowel sounds normal; no masses,  no organomegaly Extremities: edema moderate Wound: incisions ok  Lab Results:  Recent Labs  05/21/13 0335 05/22/13 0418  WBC 13.7* 16.8*  HGB 8.6* 8.5*  HCT 25.3* 24.3*  PLT 271 284   BMET:  Recent Labs  05/21/13 0335 05/22/13 0418  NA 140 140  K 3.9 4.1  CL 102 102  CO2 20 23  GLUCOSE 119* 109*  BUN 107* 89*  CREATININE 5.93* 4.96*  CALCIUM 6.7* 7.4*    PT/INR: No results found for this basename: LABPROT, INR,  in the last 72 hours ABG    Component Value Date/Time   PHART 7.343* 05/16/2013 0409   HCO3 20.3 05/16/2013 0409   TCO2 21 05/16/2013 0409   ACIDBASEDEF 5.0* 05/16/2013 0409   O2SAT 90.0 05/16/2013 0409   CBG (last 3)  No results found for this basename: GLUCAP,  in the last 72 hours  Assessment/Plan: S/P Procedure(s) (LRB): CORONARY ARTERY BYPASS GRAFTING (CABG) (N/A) Hemodynamically stable.  Postop A-fib: He is maintaining sinus rhythm 66 over the past 24 hrs on IV amio. I am atrial pacing at  80 to maximize cardiac output and BP to help with kidney recovery. He is on heparin drip but if he maintains sinus rhythm this could be stopped. I was planning on starting coumadin at some point but he may not need it if he maintains sinus rhythm. He is probably at higher risk for anticoagulation.  Acute on chronic renal failure: CRRT. Minimal urine output. Removing 100 cc/hr.  He is coughing after po ice chips so will ask speech therapy to do swallow eval.  Hypothyroidism: Replacement started IV since not taking pills.     LOS: 10 days    BARTLE,BRYAN K 05/22/2013

## 2013-05-22 NOTE — Progress Notes (Signed)
PT Cancellation Note  Patient Details Name: [name] MRN: [id number] DOB: 1933-12-06   Cancelled Treatment:    Reason Eval/Treat Not Completed: Medical issues which prohibited therapy (Pt began CVVHD, will sign off, request new orders when medically appropriate )   Fabio Asa 05/22/2013, 11:46 AM

## 2013-05-22 NOTE — Procedures (Signed)
Objective Swallowing Evaluation: Fiberoptic Endoscopic Evaluation of Swallowing  Patient Details  Name: [name] MRN: [id number] Date of Birth: 1934/02/15  Today's Date: 05/22/2013 Time: 1420-1500 SLP Time Calculation (min): 40 min  Past Medical History:  Past Medical History  Diagnosis Date  . Cancer   . Hypertension   . Collagen vascular disease   . Anginal pain   . Depression   . Arthritis    Past Surgical History:  Past Surgical History  Procedure Laterality Date  . Eye surgery    . Coronary artery bypass graft N/A 05/13/2013    Procedure: CORONARY ARTERY BYPASS GRAFTING (CABG);  Surgeon: Alleen Borne, MD;  Location: [facility] OR;  Service: Open Heart Surgery;  Laterality: N/A;   HPI:  The patient is a 79 year old male with no known history of coronary artery disease who suffered an MI.  Underwent CABG 05/13/13.  PMH:  atrial flutter, lower extremity deep venous thrombosis, chronic kidney disease, GERD, prostate CA, cervical spine compression fx.. A recent follow up duplex imaging study, performed 04/23/2013 indicated chronic popliteal vein thrombus, which had improved.  He is coughing after po ice chips so will ask speech therapy to do swallow eval.     Assessment / Plan / Recommendation Clinical Impression  Dysphagia Diagnosis: Mild oral phase dysphagia;Mild pharyngeal phase dysphagia Clinical impression: Pt. exhibited mild oral dysphagia, primarily motor involvement with delayed mastication and transit.  Mild sensorimotor pharyngeal dysphagia exhibited with probable penetration with straw sips thin (end of scope became clouded without no visibiltiy during this swallow).  Delayed swallow initiation with thinner consistencies and mild pyriform sinus and pharyngeal wall residue due to decreased laryngeal elevation and pharyngeal contraction.  Verbal cues for second swallow were moderately effective in clearing residue.  Recommend pt. initatiatDys 3 diet and nectar thick liquids (due  to intermittent lethargy and deconditioned status), pills whole in applesauce and no straws.  SLP can assess upgrade to thin at bedside without repeating objective evaluation.  ST will follow.    Treatment Recommendation  Therapy as outlined in treatment plan below    Diet Recommendation Dysphagia 3 (Mechanical Soft);Nectar-thick liquid   Liquid Administration via: Cup;No straw Medication Administration: Whole meds with puree Supervision: Patient able to self feed;Full supervision/cueing for compensatory strategies Compensations: Small sips/bites;Slow rate Postural Changes and/or Swallow Maneuvers: Seated upright 90 degrees    Other  Recommendations Oral Care Recommendations: Oral care BID   Follow Up Recommendations  None    Frequency and Duration min 2x/week  2 weeks   Pertinent Vitals/Pain none    SLP Swallow Goals Patient will utilize recommended strategies during swallow to increase swallowing safety with: Minimal cueing      Reason for Referral Objectively evaluate swallowing function   Oral Phase Oral Preparation/Oral Phase Oral Phase: WFL Oral - Solids Oral - Regular: Weak lingual manipulation;Delayed oral transit   Pharyngeal Phase Pharyngeal Phase Pharyngeal Phase: Impaired Pharyngeal - Pudding Pharyngeal - Pudding Teaspoon: Pharyngeal residue - pyriform sinuses;Pharyngeal residue - valleculae;Pharyngeal residue - posterior pharnyx Pharyngeal - Honey Pharyngeal - Honey Teaspoon: Pharyngeal residue - pyriform sinuses;Pharyngeal residue - posterior pharnyx;Delayed swallow initiation;Premature spillage to valleculae Pharyngeal - Honey Cup: Delayed swallow initiation;Premature spillage to valleculae;Pharyngeal residue - pyriform sinuses Pharyngeal - Nectar Pharyngeal - Nectar Teaspoon: Delayed swallow initiation;Premature spillage to valleculae;Pharyngeal residue - pyriform sinuses Pharyngeal - Nectar Cup: Delayed swallow initiation;Premature spillage to  valleculae;Pharyngeal residue - pyriform sinuses Pharyngeal - Thin Pharyngeal - Thin Teaspoon: Delayed swallow initiation;Premature spillage to pyriform sinuses;Pharyngeal residue -  pyriform sinuses Pharyngeal - Thin Cup: Pharyngeal residue - pyriform sinuses;Delayed swallow initiation;Premature spillage to pyriform sinuses Pharyngeal - Thin Straw: Penetration/Aspiration during swallow;Delayed swallow initiation;Premature spillage to pyriform sinuses;Pharyngeal residue - pyriform sinuses Penetration/Aspiration details (thin straw):  (scope occluded, not visualized, strong reflexive cough) Pharyngeal - Solids Pharyngeal - Regular: Within functional limits  Cervical Esophageal Phase        Cervical Esophageal Phase Cervical Esophageal Phase: [facility]         Darrow Bussing.Ed [employer] [phone]  05/22/2013

## 2013-05-22 NOTE — Progress Notes (Signed)
Spoke with Dr. Eliott Nine in reference to the critical lab value of PTT >200.  She advised to turn heparin syringe running with the CRRT off and recheck ACT in one hour.  Heparin syringe off.  Will continue to monitor.

## 2013-05-22 NOTE — Progress Notes (Signed)
Subjective:  CRRt initiated- BP is actually better- UOP remains poor Objective Vital signs in last 24 hours: Filed Vitals:   05/22/13 0500 05/22/13 0600 05/22/13 0700 05/22/13 0715  BP: 132/62 139/66 143/70   Pulse: 79 80 80   Temp: 97.7 F (36.5 C) 97.5 F (36.4 C) 97.3 F (36.3 C)   TempSrc:      Resp: 15 17 16    Height:      Weight: 122 kg (268 lb 15.4 oz)     SpO2: 94% 96% 96% 97%   Weight change: 0 kg (0 lb)  Intake/Output Summary (Last 24 hours) at 05/22/13 0741 Last data filed at 05/22/13 0700  Gross per 24 hour  Intake  920.5 ml  Output   1382 ml  Net -461.5 ml   Labs: Basic Metabolic Panel:  Recent Labs Lab 05/16/13 0400  05/20/13 0500 05/21/13 0335 05/22/13 0418  NA 138  < > 142 140 140  K 3.7  < > 3.7 3.9 4.1  CL 107  < > 104 102 102  CO2 22  < > 20 20 23   GLUCOSE 128*  < > 123* 119* 109*  BUN 43*  < > 93* 107* 89*  CREATININE 2.27*  < > 5.17* 5.93* 4.96*  CALCIUM 6.9*  < > 6.7* 6.7* 7.4*  PHOS 3.9  --  6.5*  --  5.2*  < > = values in this interval not displayed. Liver Function Tests:  Recent Labs Lab 05/19/13 0500 05/20/13 0500 05/21/13 0335 05/22/13 0418  AST 50*  --  77*  --   ALT 38  --  36  --   ALKPHOS 107  --  207*  --   BILITOT 2.5*  --  2.7*  --   PROT 6.0  --  6.5  --   ALBUMIN 2.4* 2.4* 2.3* 2.4*   No results found for this basename: LIPASE, AMYLASE,  in the last 168 hours No results found for this basename: AMMONIA,  in the last 168 hours CBC:  Recent Labs Lab 05/17/13 0350 05/18/13 0400 05/20/13 0500 05/21/13 0335 05/22/13 0418  WBC 9.1 7.0 9.6 13.7* 16.8*  HGB 9.1* 9.7* 8.3* 8.6* 8.5*  HCT 28.2* 30.8* 25.2* 25.3* 24.3*  MCV 81.7 82.8 80.3 78.3 77.4*  PLT 114* 168 221 271 284   Cardiac Enzymes: No results found for this basename: CKTOTAL, CKMB, CKMBINDEX, TROPONINI,  in the last 168 hours CBG:  Recent Labs Lab 05/17/13 0348 05/17/13 0721 05/17/13 1159 05/17/13 1537 05/17/13 2008  GLUCAP 101* 105* 98 101*  96    Iron Studies: No results found for this basename: IRON, TIBC, TRANSFERRIN, FERRITIN,  in the last 72 hours Studies/Results: Dg Chest Port 1 View  05/21/2013   *RADIOLOGY REPORT*  Clinical Data: Bedside hemodialysis catheter placement.  PORTABLE CHEST - 1 VIEW 05/21/2013 1514 hours:  Comparison: Portable chest x-rays 05/19/2013 dating back to 05/16/2013.  Findings: Left jugular dialysis catheter tip projects over the upper SVC.  No evidence of pneumothorax or mediastinal hematoma. Right subclavian central venous catheter tip projects over the lower SVC.  Prior sternotomy for CABG.  Cardiac silhouette enlarged but stable.  Pulmonary venous hypertension without overt edema. Stable dense consolidation in the left lower lobe and mild atelectasis at the right lung base.  No new pulmonary parenchymal abnormalities.  IMPRESSION:  1.  Left jugular dialysis catheter tip projects over the upper SVC. No acute complicating features. 2.  Right subclavian central venous catheter tip projects over the  lower SVC.  No acute complicating features. 3.  Stable dense left lower lobe atelectasis and/or pneumonia and mild right basilar atelectasis since the examination 2 days ago. No new abnormalities.   Original Report Authenticated By: Hulan Saas, M.D.   Medications: Infusions: . sodium chloride    . amiodarone (NEXTERONE PREMIX) 360 mg/200 mL dextrose 30 mg/hr (05/22/13 0530)  . heparin 10,000 units/ 20 mL infusion syringe Stopped (05/22/13 0518)  . heparin 1,200 Units/hr (05/22/13 0200)  . dialysis replacement fluid (prismasate)    . dialysis replacement fluid (prismasate)    . dialysate (PRISMASATE) 1,500 mL/hr at 05/22/13 0445    Scheduled Medications: . ipratropium  0.5 mg Nebulization Q6H   And  . albuterol  2.5 mg Nebulization Q6H  . antiseptic oral rinse  15 mL Mouth Rinse BID  . chlorhexidine  15 mL Mouth/Throat BID  . darbepoetin (ARANESP) injection - NON-DIALYSIS  60 mcg Subcutaneous Q  Tue-1800  . feeding supplement  1 Container Oral TID BM  . levothyroxine  25 mcg Intravenous Daily  . pantoprazole (PROTONIX) IV  40 mg Intravenous Q24H    have reviewed scheduled and prn medications.  Physical Exam: General: in bed- arousable to stimulation- still feels poorly new left IJ HD cath (placed 7/17) Heart: RRR Lungs: mostly clear Abdomen: distended Extremities: pitting edema   Assessment/Plan:79 year old WM with multiple medical problems including CKD who now has A on CRF in the setting of emergent cath/CABG and hemodynamic instability  1.Renal- A on CRF in the setting of above. Unfortunately UOP is still marginal.  CRRT was initiated late yest.  There have been no mechanical issues, CRRT running well.  I would like to keep foley cath in because want to be able to monitor for renal recovery.  I am hopeful this will not be a permanent situation.  2. Hypertension/volume - is volume overloaded-very little UOP.  will increase the rate of UF 3. Anemia - hgb 8.6 -  down for situation. Have added aranesp and follow- transfuse as needed.  4. Elytes- pretty stable 5. Metabolic acidosis- not much of an issue, will be able to use prismasate.    [name] A   05/22/2013,7:41 AM  LOS: 10 days

## 2013-05-23 ENCOUNTER — Inpatient Hospital Stay (HOSPITAL_COMMUNITY): Payer: Medicare Other

## 2013-05-23 LAB — CBC
HCT: 25.9 % — ABNORMAL LOW (ref 39.0–52.0)
MCHC: 34 g/dL (ref 30.0–36.0)
Platelets: 278 10*3/uL (ref 150–400)
RDW: 18.2 % — ABNORMAL HIGH (ref 11.5–15.5)
WBC: 19.7 10*3/uL — ABNORMAL HIGH (ref 4.0–10.5)

## 2013-05-23 LAB — RENAL FUNCTION PANEL
Albumin: 2.3 g/dL — ABNORMAL LOW (ref 3.5–5.2)
BUN: 56 mg/dL — ABNORMAL HIGH (ref 6–23)
BUN: 50 mg/dL — ABNORMAL HIGH (ref 6–23)
Calcium: 7.9 mg/dL — ABNORMAL LOW (ref 8.4–10.5)
Chloride: 102 mEq/L (ref 96–112)
Chloride: 100 mEq/L (ref 96–112)
Creatinine, Ser: 3.41 mg/dL — ABNORMAL HIGH (ref 0.50–1.35)
Creatinine, Ser: 3 mg/dL — ABNORMAL HIGH (ref 0.50–1.35)
Glucose, Bld: 103 mg/dL — ABNORMAL HIGH (ref 70–99)
Phosphorus: 4.2 mg/dL (ref 2.3–4.6)
Potassium: 4.2 mEq/L (ref 3.5–5.1)

## 2013-05-23 LAB — EXPECTORATED SPUTUM ASSESSMENT W GRAM STAIN, RFLX TO RESP C: Special Requests: NORMAL

## 2013-05-23 LAB — APTT: aPTT: 154 seconds — ABNORMAL HIGH (ref 24–37)

## 2013-05-23 LAB — HEPARIN LEVEL (UNFRACTIONATED): Heparin Unfractionated: 0.4 IU/mL (ref 0.30–0.70)

## 2013-05-23 MED ORDER — SODIUM CHLORIDE 0.9 % IJ SOLN
10.0000 mL | Freq: Two times a day (BID) | INTRAMUSCULAR | Status: DC
  Administered 2013-05-23: 10 mL
  Administered 2013-05-24: 40 mL
  Administered 2013-05-24: 10 mL
  Administered 2013-05-25 (×2): 20 mL
  Administered 2013-05-26: 10 mL
  Administered 2013-05-26: 30 mL
  Administered 2013-05-27: 10 mL
  Administered 2013-05-27: 30 mL
  Administered 2013-05-28: 10 mL
  Administered 2013-05-28 – 2013-05-29 (×2): 30 mL

## 2013-05-23 MED ORDER — SODIUM CHLORIDE 0.9 % IJ SOLN
10.0000 mL | INTRAMUSCULAR | Status: DC | PRN
  Administered 2013-05-24: 10 mL
  Administered 2013-05-24: 20 mL
  Administered 2013-05-25: 10 mL

## 2013-05-23 NOTE — Progress Notes (Signed)
ANTICOAGULATION CONSULT NOTE - Follow Up Consult  Pharmacy Consult for Heparin Indication: atrial fibrillation and chronic DVT  No Known Allergies  Patient Measurements: Height: 6\' 1"  (185.4 cm) Weight: 265 lb 14 oz (120.6 kg) IBW/kg (Calculated) : 79.9 Heparin Dosing Weight: 107 kg  Vital Signs: Temp: 98.1 F (36.7 C) (07/19 0800) Temp src: Core (Comment) (07/19 0400) BP: 110/57 mmHg (07/19 1000) Pulse Rate: 105 (07/19 1000)  Labs:  Recent Labs  05/21/13 0335  05/22/13 0053 05/22/13 0418 05/22/13 0930 05/22/13 1600 05/23/13 0400  HGB 8.6*  --   --  8.5*  --   --  8.8*  HCT 25.3*  --   --  24.3*  --   --  25.9*  PLT 271  --   --  284  --   --  278  APTT  --   --   --  >200*  --   --  154*  HEPARINUNFRC  --   < > 0.77*  --  0.56  --  0.40  CREATININE 5.93*  --   --  4.96*  --  3.97* 3.41*  < > = values in this interval not displayed.  Estimated Creatinine Clearance: 24.7 ml/min (by C-G formula based on Cr of 3.41).   Medications:  Infusions:  . sodium chloride    . amiodarone (NEXTERONE PREMIX) 360 mg/200 mL dextrose 30 mg/hr (05/23/13 0611)  . heparin 10,000 units/ 20 mL infusion syringe Stopped (05/22/13 0518)  . heparin 1,200 Units/hr (05/22/13 1344)  . dialysis replacement fluid (prismasate) 250 mL/hr at 05/22/13 1535  . dialysis replacement fluid (prismasate)    . dialysate (PRISMASATE) 1,500 mL/hr at 05/23/13 5409    Assessment: 79 year old male POD #10 s/p CABG who was started on anticoagulation with Heparin for persistent atrial fibrillation and history of chronic DVT.  Heparin level is therapeutic at 0.40. CBC stable, no s/sx of bleeding noted at this time. RN reports that heparin not needed for CRRT as ACT is therapeutic.  Goal of Therapy:  Heparin level 0.3-0.7 units/ml Monitor platelets by anticoagulation protocol: Yes   Plan:  - Continue heparin at 1200 units/hr - Check daily heparin level and CBC  Wendie Simmer, PharmD, BCPS Clinical  Pharmacist  Pager: [phone]

## 2013-05-23 NOTE — Progress Notes (Signed)
Pt coughed up a sputum sample, sample labeled and sent to the main lab w/ requisition.

## 2013-05-23 NOTE — Progress Notes (Signed)
10 Days Post-Op Procedure(s) (LRB): CORONARY ARTERY BYPASS GRAFTING (CABG) (N/A) Subjective: Some chest discomfort ocassional cough  Objective: Vital signs in last 24 hours: Temp:  [97.5 F (36.4 C)-99.3 F (37.4 C)] 98.1 F (36.7 C) (07/19 0800) Pulse Rate:  [79-121] 103 (07/19 0800) Cardiac Rhythm:  [-] Sinus tachycardia (07/19 0400) Resp:  [14-21] 15 (07/19 0800) BP: (100-136)/(50-84) 119/58 mmHg (07/19 0800) SpO2:  [91 %-100 %] 96 % (07/19 0800) Weight:  [265 lb 14 oz (120.6 kg)] 265 lb 14 oz (120.6 kg) (07/19 0500)  Hemodynamic parameters for last 24 hours:    Intake/Output from previous day: 07/18 0701 - 07/19 0700 In: 880.8 [I.V.:880.8] Out: 3197 [Urine:50] Intake/Output this shift: Total I/O In: 36.7 [I.V.:36.7] Out: -   General appearance: alert and no distress Neurologic: intact Heart: irregularly irregular rhythm Lungs: rhonchi bilaterally Abdomen: normal findings: soft, non-tender Wound: clean and dry  Lab Results:  Recent Labs  05/22/13 0418 05/23/13 0400  WBC 16.8* 19.7*  HGB 8.5* 8.8*  HCT 24.3* 25.9*  PLT 284 278   BMET:  Recent Labs  05/22/13 1600 05/23/13 0400  NA 140 139  K 4.0 4.3  CL 102 102  CO2 24 25  GLUCOSE 108* 98  BUN 70* 56*  CREATININE 3.97* 3.41*  CALCIUM 7.7* 7.9*    PT/INR: No results found for this basename: LABPROT, INR,  in the last 72 hours ABG    Component Value Date/Time   PHART 7.343* 05/16/2013 0409   HCO3 20.3 05/16/2013 0409   TCO2 21 05/16/2013 0409   ACIDBASEDEF 5.0* 05/16/2013 0409   O2SAT 90.0 05/16/2013 0409   CBG (last 3)  No results found for this basename: GLUCAP,  in the last 72 hours  Assessment/Plan: S/P Procedure(s) (LRB): CORONARY ARTERY BYPASS GRAFTING (CABG) (N/A) - CV- s/p emergency CABG for CAD/MI/cardiogenic shock  Stable hemodynamics  In flutter - rapid a paced, converted to SR briefly then went into a fib  On full dose heparin  RESP- rhonchi  CXR showed improved LLL  atelectasis, but still has some bibasilar atelectasis and effusions  RENAL- On CRRT, tolerating volume removal well- still oliguric  ID- afebrile, not on antibiotics, WBC trendng up - follow for now  Dysphagia III diet- encouraged PO intake     LOS: 11 days    Lamyah Creed C 05/23/2013

## 2013-05-23 NOTE — Progress Notes (Signed)
No complaints this AM  BP 110/55  Pulse 104  Temp(Src) 97.8 F (36.6 C) (Oral)  Resp 18  Ht 6\' 1"  (1.854 m)  Wt 265 lb 14 oz (120.6 kg)  BMI 35.09 kg/m2  SpO2 94%   Intake/Output Summary (Last 24 hours) at 05/23/13 1744 Last data filed at 05/23/13 1700  Gross per 24 hour  Intake 1120.8 ml  Output   3646 ml  Net -2525.2 ml    Tolerating CRRT

## 2013-05-23 NOTE — Progress Notes (Signed)
Subjective:  No issues with CRRT- says he feels better- believes the machine is "doing its job" Objective Vital signs in last 24 hours: Filed Vitals:   05/23/13 0400 05/23/13 0500 05/23/13 0600 05/23/13 0700  BP: 120/64 112/64 128/74 116/74  Pulse: 105 106 106   Temp: 98.6 F (37 C) 98.4 F (36.9 C) 98.2 F (36.8 C) 98.2 F (36.8 C)  TempSrc: Core (Comment)     Resp: 16 15 16 14   Height:      Weight:  120.6 kg (265 lb 14 oz)    SpO2: 100% 100% 100%    Weight change: -1.4 kg (-3 lb 1.4 oz)  Intake/Output Summary (Last 24 hours) at 05/23/13 0801 Last data filed at 05/23/13 0700  Gross per 24 hour  Intake  844.1 ml  Output   3092 ml  Net -2247.9 ml   Labs: Basic Metabolic Panel:  Recent Labs Lab 05/22/13 0418 05/22/13 1600 05/23/13 0400  NA 140 140 139  K 4.1 4.0 4.3  CL 102 102 102  CO2 23 24 25   GLUCOSE 109* 108* 98  BUN 89* 70* 56*  CREATININE 4.96* 3.97* 3.41*  CALCIUM 7.4* 7.7* 7.9*  PHOS 5.2* 4.5 4.1   Liver Function Tests:  Recent Labs Lab 05/19/13 0500  05/21/13 0335 05/22/13 0418 05/22/13 1600 05/23/13 0400  AST 50*  --  77*  --   --   --   ALT 38  --  36  --   --   --   ALKPHOS 107  --  207*  --   --   --   BILITOT 2.5*  --  2.7*  --   --   --   PROT 6.0  --  6.5  --   --   --   ALBUMIN 2.4*  < > 2.3* 2.4* 2.4* 2.3*  < > = values in this interval not displayed. No results found for this basename: LIPASE, AMYLASE,  in the last 168 hours No results found for this basename: AMMONIA,  in the last 168 hours CBC:  Recent Labs Lab 05/18/13 0400 05/20/13 0500 05/21/13 0335 05/22/13 0418 05/23/13 0400  WBC 7.0 9.6 13.7* 16.8* 19.7*  HGB 9.7* 8.3* 8.6* 8.5* 8.8*  HCT 30.8* 25.2* 25.3* 24.3* 25.9*  MCV 82.8 80.3 78.3 77.4* 76.4*  PLT 168 221 271 284 278   Cardiac Enzymes: No results found for this basename: CKTOTAL, CKMB, CKMBINDEX, TROPONINI,  in the last 168 hours CBG:  Recent Labs Lab 05/17/13 0348 05/17/13 0721 05/17/13 1159  05/17/13 1537 05/17/13 2008  GLUCAP 101* 105* 98 101* 96    Iron Studies: No results found for this basename: IRON, TIBC, TRANSFERRIN, FERRITIN,  in the last 72 hours Studies/Results: Dg Chest Port 1 View  05/23/2013   *RADIOLOGY REPORT*  Clinical Data: Left lower lobe atelectasis or pneumonia  PORTABLE CHEST - 1 VIEW  Comparison: Prior radiograph from 05/21/2013.  Findings: Left-sided central venous catheters stable in position with tip overlying the distal brachycephalic vein/SVC.  Right subclavian catheter is also stable with tip in the distal SVC. Median sternotomy wires are noted.  Cardiomegaly is unchanged.  The lungs remain hypoinflated. Pulmonary venous hypertension without overt edema.  There is interval improvement in aeration at the left lung base.  Dense retrocardiac left lower lobe opacity with mild linear opacities in the mid left lung persist however. No opacity consistent with atelectasis is slightly improved. Pulmonary parenchymal abnormality.  IMPRESSION: 1.  Support lines in place  as above. 2.  Interval improvement in aeration of the left lung base with persistent dense left lower lobe atelectasis and / or pneumonia. Mild right basilar atelectasis is stable to slightly improved as well.   Original Report Authenticated By: Rise Mu, M.D.   Dg Chest Port 1 View  05/21/2013   *RADIOLOGY REPORT*  Clinical Data: Bedside hemodialysis catheter placement.  PORTABLE CHEST - 1 VIEW 05/21/2013 1514 hours:  Comparison: Portable chest x-rays 05/19/2013 dating back to 05/16/2013.  Findings: Left jugular dialysis catheter tip projects over the upper SVC.  No evidence of pneumothorax or mediastinal hematoma. Right subclavian central venous catheter tip projects over the lower SVC.  Prior sternotomy for CABG.  Cardiac silhouette enlarged but stable.  Pulmonary venous hypertension without overt edema. Stable dense consolidation in the left lower lobe and mild atelectasis at the right lung  base.  No new pulmonary parenchymal abnormalities.  IMPRESSION:  1.  Left jugular dialysis catheter tip projects over the upper SVC. No acute complicating features. 2.  Right subclavian central venous catheter tip projects over the lower SVC.  No acute complicating features. 3.  Stable dense left lower lobe atelectasis and/or pneumonia and mild right basilar atelectasis since the examination 2 days ago. No new abnormalities.   Original Report Authenticated By: Hulan Saas, M.D.   Medications: Infusions: . sodium chloride    . amiodarone (NEXTERONE PREMIX) 360 mg/200 mL dextrose 30 mg/hr (05/23/13 0611)  . heparin 10,000 units/ 20 mL infusion syringe Stopped (05/22/13 0518)  . heparin 1,200 Units/hr (05/22/13 1344)  . dialysis replacement fluid (prismasate) 250 mL/hr at 05/22/13 1535  . dialysis replacement fluid (prismasate)    . dialysate (PRISMASATE) 1,500 mL/hr at 05/23/13 0213    Scheduled Medications: . ipratropium  0.5 mg Nebulization Q6H   And  . albuterol  2.5 mg Nebulization Q6H  . antiseptic oral rinse  15 mL Mouth Rinse BID  . chlorhexidine  15 mL Mouth/Throat BID  . darbepoetin (ARANESP) injection - NON-DIALYSIS  60 mcg Subcutaneous Q Tue-1800  . levothyroxine  25 mcg Intravenous Daily  . pantoprazole (PROTONIX) IV  40 mg Intravenous Q24H    have reviewed scheduled and prn medications.  Physical Exam: General: in bed- arousable to stimulation- finally says he feels better-   left IJ HD cath (placed 7/17) Heart: RRR Lungs: mostly clear Abdomen: distended Extremities: pitting edema   Assessment/Plan:79 year old WM with multiple medical problems including CKD who now has A on CRF in the setting of emergent cath/CABG and hemodynamic instability  1.Renal- A on CRF in the setting of above. Unfortunately UOP has decreased after the initiation of CRRT.  CRRT running well.  Would like to continue CRRT today due to borderline BP and the need for much volume removal.  I am  hopeful for eventual renal recovery in this nice gentleman but time will tell.  2. Hypertension/volume - is volume overloaded-very little UOP.  Achieving 100 cc per hour UF.  Will give parameters to pull more 3. Anemia - hgb 8.8 -  down for situation. Have added aranesp and follow- transfuse as needed.  4. Elytes- pretty stable 5. Metabolic acidosis- not much of an issue, continue prismasate.    Kassidee Narciso A   05/23/2013,8:01 AM  LOS: 11 days

## 2013-05-23 NOTE — Progress Notes (Signed)
Attempted to obtain sputum culture, pt was unable to cough up any secretions.  Sterile cup was left in room. RN aware.

## 2013-05-24 ENCOUNTER — Inpatient Hospital Stay (HOSPITAL_COMMUNITY): Payer: Medicare Other

## 2013-05-24 LAB — CBC
HCT: 26.5 % — ABNORMAL LOW (ref 39.0–52.0)
Hemoglobin: 8.9 g/dL — ABNORMAL LOW (ref 13.0–17.0)
MCH: 25.8 pg — ABNORMAL LOW (ref 26.0–34.0)
MCV: 76.8 fL — ABNORMAL LOW (ref 78.0–100.0)
RBC: 3.45 MIL/uL — ABNORMAL LOW (ref 4.22–5.81)

## 2013-05-24 LAB — RENAL FUNCTION PANEL
CO2: 24 mEq/L (ref 19–32)
CO2: 25 mEq/L (ref 19–32)
Calcium: 7.9 mg/dL — ABNORMAL LOW (ref 8.4–10.5)
Chloride: 100 mEq/L (ref 96–112)
Chloride: 99 mEq/L (ref 96–112)
GFR calc Af Amer: 21 mL/min — ABNORMAL LOW (ref >90)
GFR calc Af Amer: 24 mL/min — ABNORMAL LOW (ref >90)
GFR calc non Af Amer: 21 mL/min — ABNORMAL LOW (ref >90)
Glucose, Bld: 114 mg/dL — ABNORMAL HIGH (ref 70–99)
Phosphorus: 4.3 mg/dL (ref 2.3–4.6)
Potassium: 4.1 mEq/L (ref 3.5–5.1)
Potassium: 4.3 mEq/L (ref 3.5–5.1)
Sodium: 137 mEq/L (ref 135–145)
Sodium: 137 mEq/L (ref 135–145)

## 2013-05-24 MED ORDER — LACTULOSE 10 GM/15ML PO SOLN
30.0000 g | Freq: Once | ORAL | Status: AC
  Administered 2013-05-24: 30 g via ORAL
  Filled 2013-05-24: qty 45

## 2013-05-24 MED ORDER — DM-GUAIFENESIN ER 30-600 MG PO TB12
1.0000 | ORAL_TABLET | Freq: Two times a day (BID) | ORAL | Status: AC
  Administered 2013-05-24 – 2013-05-28 (×8): 1 via ORAL
  Filled 2013-05-24 (×10): qty 1

## 2013-05-24 MED ORDER — DEXTROSE 5 % IV SOLN
1.0000 g | Freq: Two times a day (BID) | INTRAVENOUS | Status: DC
  Administered 2013-05-24 – 2013-05-25 (×3): 1 g via INTRAVENOUS
  Filled 2013-05-24 (×4): qty 1

## 2013-05-24 MED ORDER — POLYETHYLENE GLYCOL 3350 17 G PO PACK
17.0000 g | PACK | Freq: Once | ORAL | Status: AC
  Administered 2013-05-24: 17 g via ORAL
  Filled 2013-05-24: qty 1

## 2013-05-24 NOTE — Progress Notes (Signed)
11 Days Post-Op Procedure(s) (LRB): CORONARY ARTERY BYPASS GRAFTING (CABG) (N/A) Subjective: C/o nausea and back pain Congested with frequent cough  Objective: Vital signs in last 24 hours: Temp:  [97.6 F (36.4 C)-98.9 F (37.2 C)] 98.1 F (36.7 C) (07/20 0715) Pulse Rate:  [65-107] 67 (07/20 0700) Cardiac Rhythm:  [-] Normal sinus rhythm (07/20 0200) Resp:  [11-24] 14 (07/20 0700) BP: (98-128)/(47-70) 119/47 mmHg (07/20 0700) SpO2:  [92 %-100 %] 99 % (07/20 0700) Weight:  [251 lb 12.3 oz (114.2 kg)] 251 lb 12.3 oz (114.2 kg) (07/20 0600)  Hemodynamic parameters for last 24 hours:    Intake/Output from previous day: 07/19 0701 - 07/20 0700 In: 1220.8 [P.O.:300; I.V.:920.8] Out: 4134  Intake/Output this shift: Total I/O In: -  Out: 188 [Other:188]  General appearance: alert and no distress Heart: regular rate and rhythm Lungs: rhonchi bilaterally Abdomen: mildly distended, soft, nontender, good BS Wound: clean and dry  Lab Results:  Recent Labs  05/23/13 0400 05/24/13 0423  WBC 19.7* 20.8*  HGB 8.8* 8.9*  HCT 25.9* 26.5*  PLT 278 228   BMET:  Recent Labs  05/23/13 1550 05/24/13 0423  NA 138 137  K 4.2 4.1  CL 100 100  CO2 26 24  GLUCOSE 103* 114*  BUN 50* 49*  CREATININE 3.00* 3.03*  CALCIUM 8.0* 7.9*    PT/INR: No results found for this basename: LABPROT, INR,  in the last 72 hours ABG    Component Value Date/Time   PHART 7.343* 05/16/2013 0409   HCO3 20.3 05/16/2013 0409   TCO2 21 05/16/2013 0409   ACIDBASEDEF 5.0* 05/16/2013 0409   O2SAT 90.0 05/16/2013 0409   CBG (last 3)  No results found for this basename: GLUCAP,  in the last 72 hours  Assessment/Plan: S/P Procedure(s) (LRB): CORONARY ARTERY BYPASS GRAFTING (CABG) (N/A) - CV- s/p MI/ cardiogenic shock  Converted to SR at 60-70 last PM- continue amiodarone IV until nausea resolves and then change to PO  On heparin gtt with plan to eventually convert to coumadin- may not need to use  coumadin if he will stay in SR  RESP- increased congestion and productive cough, Afebrile but WBC elevated, CXR shows some LLL atelectasis will start empiric antibiotics with ceftaz  RENAL- oliguric acute on chronic renal failure- 5 liters negative over past 48 hours- continue CRRT one more day  GI- c/o nausea, has good BS, no BM- will try lactulose  DVT prophylaxis- on heparin gtt currently   LOS: 12 days    [name] C 05/24/2013

## 2013-05-24 NOTE — Progress Notes (Signed)
Subjective:  No issues with CRRT- back to saying he feels rough- essentially no UOP- on CRRT was 3 liters negative Objective Vital signs in last 24 hours: Filed Vitals:   05/24/13 0500 05/24/13 0600 05/24/13 0700 05/24/13 0715  BP: 118/50 107/47 119/47   Pulse: 70 69 67   Temp:    98.1 F (36.7 C)  TempSrc:    Oral  Resp: 20 24 14    Height:      Weight:  114.2 kg (251 lb 12.3 oz)    SpO2: 99% 100% 99%    Weight change: -6.4 kg (-14 lb 1.8 oz)  Intake/Output Summary (Last 24 hours) at 05/24/13 0754 Last data filed at 05/24/13 0700  Gross per 24 hour  Intake 1220.8 ml  Output   4134 ml  Net -2913.2 ml   Labs: Basic Metabolic Panel:  Recent Labs Lab 05/23/13 0400 05/23/13 1550 05/24/13 0423  NA 139 138 137  K 4.3 4.2 4.1  CL 102 100 100  CO2 25 26 24   GLUCOSE 98 103* 114*  BUN 56* 50* 49*  CREATININE 3.41* 3.00* 3.03*  CALCIUM 7.9* 8.0* 7.9*  PHOS 4.1 4.2 4.3   Liver Function Tests:  Recent Labs Lab 05/19/13 0500  05/21/13 0335  05/23/13 0400 05/23/13 1550 05/24/13 0423  AST 50*  --  77*  --   --   --   --   ALT 38  --  36  --   --   --   --   ALKPHOS 107  --  207*  --   --   --   --   BILITOT 2.5*  --  2.7*  --   --   --   --   PROT 6.0  --  6.5  --   --   --   --   ALBUMIN 2.4*  < > 2.3*  < > 2.3* 2.4* 2.3*  < > = values in this interval not displayed. No results found for this basename: LIPASE, AMYLASE,  in the last 168 hours No results found for this basename: AMMONIA,  in the last 168 hours CBC:  Recent Labs Lab 05/20/13 0500 05/21/13 0335 05/22/13 0418 05/23/13 0400 05/24/13 0423  WBC 9.6 13.7* 16.8* 19.7* 20.8*  HGB 8.3* 8.6* 8.5* 8.8* 8.9*  HCT 25.2* 25.3* 24.3* 25.9* 26.5*  MCV 80.3 78.3 77.4* 76.4* 76.8*  PLT 221 271 284 278 228   Cardiac Enzymes: No results found for this basename: CKTOTAL, CKMB, CKMBINDEX, TROPONINI,  in the last 168 hours CBG:  Recent Labs Lab 05/17/13 1159 05/17/13 1537 05/17/13 2008  GLUCAP 98 101* 96     Iron Studies: No results found for this basename: IRON, TIBC, TRANSFERRIN, FERRITIN,  in the last 72 hours Studies/Results: Dg Chest Port 1 View  05/24/2013   *RADIOLOGY REPORT*  Clinical Data: Follow up atelectasis  PORTABLE CHEST - 1 VIEW  Comparison: 05/23/2013  Findings: Pulmonary vascular congestion without frank interstitial edema.  Mild patchy left lower lobe opacity, atelectasis versus pneumonia.  Mild right basilar atelectasis. No pleural effusion or pneumothorax.  Stable right subclavian and left IJ venous catheter.  IMPRESSION: Patchy left lower lobe opacity, atelectasis versus pneumonia, unchanged.  Mild right basilar atelectasis.  Pulmonary vascular congestion without frank interstitial edema.   Original Report Authenticated By: Charline Bills, M.D.   Dg Chest Port 1 View  05/23/2013   *RADIOLOGY REPORT*  Clinical Data: Left lower lobe atelectasis or pneumonia  PORTABLE CHEST - 1  VIEW  Comparison: Prior radiograph from 05/21/2013.  Findings: Left-sided central venous catheters stable in position with tip overlying the distal brachycephalic vein/SVC.  Right subclavian catheter is also stable with tip in the distal SVC. Median sternotomy wires are noted.  Cardiomegaly is unchanged.  The lungs remain hypoinflated. Pulmonary venous hypertension without overt edema.  There is interval improvement in aeration at the left lung base.  Dense retrocardiac left lower lobe opacity with mild linear opacities in the mid left lung persist however. No opacity consistent with atelectasis is slightly improved. Pulmonary parenchymal abnormality.  IMPRESSION: 1.  Support lines in place as above. 2.  Interval improvement in aeration of the left lung base with persistent dense left lower lobe atelectasis and / or pneumonia. Mild right basilar atelectasis is stable to slightly improved as well.   Original Report Authenticated By: Rise Mu, M.D.   Medications: Infusions: . sodium chloride    .  amiodarone (NEXTERONE PREMIX) 360 mg/200 mL dextrose 30 mg/hr (05/24/13 0521)  . heparin 10,000 units/ 20 mL infusion syringe Stopped (05/22/13 0518)  . heparin 1,200 Units/hr (05/23/13 1233)  . dialysis replacement fluid (prismasate) 250 mL/hr at 05/24/13 0415  . dialysis replacement fluid (prismasate) 250 mL/hr at 05/23/13 1128  . dialysate (PRISMASATE) 1,500 mL/hr at 05/24/13 7829    Scheduled Medications: . ipratropium  0.5 mg Nebulization Q6H   And  . albuterol  2.5 mg Nebulization Q6H  . antiseptic oral rinse  15 mL Mouth Rinse BID  . chlorhexidine  15 mL Mouth/Throat BID  . darbepoetin (ARANESP) injection - NON-DIALYSIS  60 mcg Subcutaneous Q Tue-1800  . levothyroxine  25 mcg Intravenous Daily  . pantoprazole (PROTONIX) IV  40 mg Intravenous Q24H  . sodium chloride  10-40 mL Intracatheter Q12H    have reviewed scheduled and prn medications.  Physical Exam: General: in bed- arousable to stimulation- finally says he feels better-   left IJ HD cath (placed 7/17) Heart: RRR Lungs: mostly clear Abdomen: distended Extremities: pitting edema   Assessment/Plan:79 year old WM with multiple medical problems including CKD who now has [name] on CRF in the setting of emergent cath/CABG and hemodynamic instability  1.Renal- [name] on CRF( baseline 1.5-2) in the setting of above. Unfortunately UOP has decreased after the initiation of CRRT and did not respond to lasix challenge in the past.  CRRT running well.  Would like to continue CRRT today due to borderline BP and the need for much volume removal.  Possible transition to IHD tomorrow.   I am hopeful for eventual renal recovery in this nice gentleman but time will tell.  2. Hypertension/volume - is volume overloaded-very little UOP.  Achieving 150  cc per hour UF with CRRT.  Will continue 3. Anemia - hgb 8.9 -  down for situation. Have added aranesp and follow- transfuse as needed. No anticoagulation with CRRT as is on separate heparin drip  4.  Elytes- pretty stable 5. Metabolic acidosis- not much of an issue, continue prismasate.  6. S/P CABG- per CCM.  WIll likely try to transition to IHD tomorrow so can mobilize pt   [name]   05/24/2013,7:54 AM  LOS: 12 days

## 2013-05-24 NOTE — Progress Notes (Signed)
Prismaflex CRRT continuously alarming "PBP pump/weight failure" and "PBP clamped"; all troubleshooting recommendations attempted without successful return to normal function; final alarm was for "PBP scale failure, disconnect and call service", Pt disconnected from CRRT without blood return and dialysis cath flushed with NS; Prismaflex taken out of service and Biomed repair request placed. Pt without any harm noted from CRRT failure. Dr Kathrene Bongo orders for IHD attempt in A.M.

## 2013-05-24 NOTE — Progress Notes (Signed)
ANTICOAGULATION CONSULT NOTE - Follow Up Consult  Pharmacy Consult for Heparin Indication: atrial fibrillation and chronic DVT  No Known Allergies  Patient Measurements: Height: 6\' 1"  (185.4 cm) Weight: 251 lb 12.3 oz (114.2 kg) IBW/kg (Calculated) : 79.9 Heparin Dosing Weight: 107 kg  Vital Signs: Temp: 98.1 F (36.7 C) (07/20 0715) Temp src: Oral (07/20 0715) BP: 111/70 mmHg (07/20 1000) Pulse Rate: 68 (07/20 1000)  Labs:  Recent Labs  05/22/13 0418 05/22/13 0930  05/23/13 0400 05/23/13 1550 05/24/13 0423  HGB 8.5*  --   --  8.8*  --  8.9*  HCT 24.3*  --   --  25.9*  --  26.5*  PLT 284  --   --  278  --  228  APTT >200*  --   --  154*  --  140*  HEPARINUNFRC  --  0.56  --  0.40  --  0.42  CREATININE 4.96*  --   < > 3.41* 3.00* 3.03*  < > = values in this interval not displayed.  Estimated Creatinine Clearance: 27 ml/min (by C-G formula based on Cr of 3.03).   Medications:  Infusions:  . sodium chloride    . amiodarone (NEXTERONE PREMIX) 360 mg/200 mL dextrose 30 mg/hr (05/24/13 0521)  . heparin 10,000 units/ 20 mL infusion syringe Stopped (05/22/13 0518)  . heparin 1,200 Units/hr (05/24/13 1019)  . dialysis replacement fluid (prismasate) 250 mL/hr at 05/24/13 0415  . dialysis replacement fluid (prismasate) 250 mL/hr at 05/24/13 0842  . dialysate (PRISMASATE) 1,500 mL/hr at 05/24/13 1019    Assessment: 79 year old male POD #11 s/p CABG who was started on anticoagulation with Heparin for persistent atrial fibrillation and history of chronic DVT.  Heparin level is therapeutic at 0.42. CBC stable, no s/sx of bleeding noted at this time.   Goal of Therapy:  Heparin level 0.3-0.7 units/ml Monitor platelets by anticoagulation protocol: Yes   Plan:  Continue heparin at 1200 units/hr Check daily heparin level and CBC F/u transition to coumadin  Wendie Simmer, PharmD, BCPS Clinical Pharmacist  Pager: [phone]

## 2013-05-24 NOTE — Progress Notes (Signed)
Per renal MD Kathrene Bongo do not restart CRRT if filter clots.  Will transition to IHD tomorrow.

## 2013-05-24 NOTE — Progress Notes (Signed)
Pt called staff to room after vomiting a significant amount of emesis consisting of bile and undigested food particles, Pt given Zofran 4 mg IV PRN, bathed and gown and linen changed; Pt reports a slight improvement in nausea, will cont' to monitor and assess.

## 2013-05-24 NOTE — Progress Notes (Signed)
Patient ID: ISAIR INABINET, male   DOB: 1934/07/05, 79 y.o.   MRN: [id number] PM ROUNDS  "please let me go home"  Still having some nausea  BP 108/48  Pulse 71  Temp(Src) 97.8 F (36.6 C) (Oral)  Resp 15  Ht 6\' 1"  (1.854 m)  Wt 251 lb 12.3 oz (114.2 kg)  BMI 33.22 kg/m2  SpO2 94%   Intake/Output Summary (Last 24 hours) at 05/24/13 1820 Last data filed at 05/24/13 1800  Gross per 24 hour  Intake 1024.1 ml  Output   4385 ml  Net -3360.9 ml    + flatus but no BM yet

## 2013-05-25 LAB — RENAL FUNCTION PANEL
CO2: 26 mEq/L (ref 19–32)
CO2: 24 mEq/L (ref 19–32)
Calcium: 7.7 mg/dL — ABNORMAL LOW (ref 8.4–10.5)
Chloride: 99 mEq/L (ref 96–112)
Creatinine, Ser: 3.39 mg/dL — ABNORMAL HIGH (ref 0.50–1.35)
Creatinine, Ser: 2.47 mg/dL — ABNORMAL HIGH (ref 0.50–1.35)
GFR calc Af Amer: 19 mL/min — ABNORMAL LOW (ref >90)
GFR calc non Af Amer: 24 mL/min — ABNORMAL LOW (ref >90)
Glucose, Bld: 129 mg/dL — ABNORMAL HIGH (ref 70–99)
Sodium: 136 mEq/L (ref 135–145)

## 2013-05-25 LAB — HEPATITIS B SURFACE ANTIBODY,QUALITATIVE: Hep B S Ab: POSITIVE — AB

## 2013-05-25 LAB — CBC
MCH: 25.5 pg — ABNORMAL LOW (ref 26.0–34.0)
MCV: 78.3 fL (ref 78.0–100.0)
Platelets: 200 10*3/uL (ref 150–400)
RDW: 18.5 % — ABNORMAL HIGH (ref 11.5–15.5)

## 2013-05-25 LAB — CULTURE, RESPIRATORY W GRAM STAIN

## 2013-05-25 LAB — HEPATITIS B CORE ANTIBODY, TOTAL: Hep B Core Total Ab: NEGATIVE

## 2013-05-25 LAB — HEPARIN LEVEL (UNFRACTIONATED): Heparin Unfractionated: 0.32 IU/mL (ref 0.30–0.70)

## 2013-05-25 MED ORDER — RENA-VITE PO TABS
1.0000 | ORAL_TABLET | Freq: Every day | ORAL | Status: DC
  Administered 2013-05-25 – 2013-06-03 (×9): 1 via ORAL
  Filled 2013-05-25 (×11): qty 1

## 2013-05-25 MED ORDER — ENSURE PUDDING PO PUDG
1.0000 | Freq: Three times a day (TID) | ORAL | Status: DC
  Administered 2013-05-25 – 2013-05-29 (×5): 1 via ORAL

## 2013-05-25 MED ORDER — HEPARIN SOD (PORK) LOCK FLUSH 100 UNIT/ML IV SOLN
2800.0000 [IU] | Freq: Once | INTRAVENOUS | Status: AC
  Administered 2013-05-25: 2800 [IU] via INTRAVENOUS

## 2013-05-25 MED ORDER — DEXTROSE 5 % IV SOLN
1.0000 g | INTRAVENOUS | Status: DC
  Administered 2013-05-25: 1 g via INTRAVENOUS
  Filled 2013-05-25 (×2): qty 1

## 2013-05-25 MED ORDER — ALBUMIN HUMAN 25 % IV SOLN
INTRAVENOUS | Status: AC
  Filled 2013-05-25: qty 100

## 2013-05-25 NOTE — Progress Notes (Signed)
12 Days Post-Op Procedure(s) (LRB): CORONARY ARTERY BYPASS GRAFTING (CABG) (N/A) Subjective: nsr- afib Anuric- HD to start No BM IV Fortaz started formpneumonia Objective: Vital signs in last 24 hours: Temp:  [97.4 F (36.3 C)-99.1 F (37.3 C)] 97.9 F (36.6 C) (07/21 1523) Pulse Rate:  [61-119] 112 (07/21 1523) Cardiac Rhythm:  [-] Atrial fibrillation (07/21 1520) Resp:  [9-23] 18 (07/21 1523) BP: (86-118)/(31-92) 100/51 mmHg (07/21 1523) SpO2:  [91 %-100 %] 97 % (07/21 1523) Weight:  [240 lb 15.4 oz (109.3 kg)-245 lb 9.5 oz (111.4 kg)] 240 lb 15.4 oz (109.3 kg) (07/21 1503)  Hemodynamic parameters for last 24 hours:    Intake/Output from previous day: 07/20 0701 - 07/21 0700 In: 1306.8 [P.O.:240; I.V.:966.8; IV Piggyback:100] Out: 2887  Intake/Output this shift: Total I/O In: 245.5 [I.V.:195.5; IV Piggyback:50] Out: 2305 [Other:2305]    Lab Results:  Recent Labs  05/24/13 0423 05/25/13 0300  WBC 20.8* 17.0*  HGB 8.9* 8.7*  HCT 26.5* 26.7*  PLT 228 200   BMET:  Recent Labs  05/24/13 1500 05/25/13 0300  NA 137 136  K 4.3 4.3  CL 99 98  CO2 25 26  GLUCOSE 116* 129*  BUN 42* 51*  CREATININE 2.72* 3.39*  CALCIUM 7.9* 7.7*    PT/INR: No results found for this basename: LABPROT, INR,  in the last 72 hours ABG    Component Value Date/Time   PHART 7.343* 05/16/2013 0409   HCO3 20.3 05/16/2013 0409   TCO2 21 05/16/2013 0409   ACIDBASEDEF 5.0* 05/16/2013 0409   O2SAT 90.0 05/16/2013 0409   CBG (last 3)  No results found for this basename: GLUCAP,  in the last 72 hours  Assessment/Plan: S/P Procedure(s) (LRB): CORONARY ARTERY BYPASS GRAFTING (CABG) (N/A) CXR in AM Cont current care   LOS: 13 days    VAN TRIGT III,Jovoni Borkenhagen 05/25/2013

## 2013-05-25 NOTE — Progress Notes (Signed)
       [address]       [place] [address]             [phone]          12 Days Post-Op Procedure(s) (LRB): CORONARY ARTERY BYPASS GRAFTING (CABG) (N/A)  Sputum culture positive for Enterobacter aerogenes, resistant to [employer].  Spoke with pharmacy, and will change to Maxepime 1 g daily and they will adjust for renal function as needed.      LOS: 13 days    COLLINS,GINA H 05/25/2013

## 2013-05-25 NOTE — Progress Notes (Signed)
NUTRITION FOLLOW UP  Intervention:   Add Ensure Pudding po TID, each supplement provides 170 kcal and 4 grams of protein.  RD to continue to follow nutrition care plan.  Nutrition Dx:   Inadequate oral intake now related to poor appetite and nausea AEB pt report and poor meal completion.  Goal:   Intake to meet >90% of estimated nutrition needs. Unmet.  Monitor:   weight trends, labs, I/O's, po intake, supplement tolerance  Assessment:   Pt transferred from [facility] related to STEMI. Pt required emergent intubation related to acute respiratory failure in the setting of ischemia. S/p cardiac cath and CABG on 7/9.  Extubated 7/12. CRRT initiated 7/17 - stopped on 7/20 2/2 clogged filter; renal team is initiating IHD today. Now on Rena-Vite.  Underwent FEES 7/18 - pt initiated on Dysphagia 3 diet with Nectar Thickened Liquids 2/2 intermittent lethargy and deconditioned status. Pt reports very poor appetite. RN notes that pt was eating fair over weekend - consumed approximately 50% of meals on Saturday. Breakfast tray untouched, pt just finished working with PT and per RN, complained of nausea during the entire time, however did not vomit.  Significant amount of emesis consisting of bile and undigested food particles on morning of 7/20.  Labs reviewed: sodium and potassium WNL. Phosphorus now WNL.  Height: Ht Readings from Last 1 Encounters:  05/12/13 6\' 1"  (1.854 m)    Weight Status:   Wt Readings from Last 1 Encounters:  05/25/13 245 lb 9.5 oz (111.4 kg)  Admit wt: 243 lb (UBW: 235 - 245 lb) Wt now consistent with admit wt likely 2/2 fluid balance maintained by HD.  Body mass index is 32.41 kg/(m^2). Obese Class I  Re-estimated needs:  Kcal: 2000 - 2300 Protein: 155 - 175 g Fluid: per team  Skin:  Chest incision R leg incision L leg incision L tibial wound Moderate edema  Diet Order: Dysphagia 3; Nectar Thickened Liquids   Intake/Output Summary (Last 24  hours) at 05/25/13 0849 Last data filed at 05/25/13 0800  Gross per 24 hour  Intake 1299.8 ml  Output   2699 ml  Net -1399.2 ml    Last BM: 7/21   Labs:   Recent Labs Lab 05/23/13 0400  05/24/13 0423 05/24/13 1500 05/25/13 0300  NA 139  < > 137 137 136  K 4.3  < > 4.1 4.3 4.3  CL 102  < > 100 99 98  CO2 25  < > 24 25 26   BUN 56*  < > 49* 42* 51*  CREATININE 3.41*  < > 3.03* 2.72* 3.39*  CALCIUM 7.9*  < > 7.9* 7.9* 7.7*  MG 2.9*  --  3.0*  --  2.9*  PHOS 4.1  < > 4.3 3.4 4.0  GLUCOSE 98  < > 114* 116* 129*  < > = values in this interval not displayed.  CBG (last 3)  No results found for this basename: GLUCAP,  in the last 72 hours  Scheduled Meds: . ipratropium  0.5 mg Nebulization Q6H   And  . albuterol  2.5 mg Nebulization Q6H  . antiseptic oral rinse  15 mL Mouth Rinse BID  . cefTAZidime (FORTAZ)  IV  1 g Intravenous Q12H  . chlorhexidine  15 mL Mouth/Throat BID  . darbepoetin (ARANESP) injection - NON-DIALYSIS  60 mcg Subcutaneous Q Tue-1800  . dextromethorphan-guaiFENesin  1 tablet Oral BID  . levothyroxine  25 mcg Intravenous Daily  . multivitamin  1 tablet Oral Daily  .  pantoprazole (PROTONIX) IV  40 mg Intravenous Q24H  . sodium chloride  10-40 mL Intracatheter Q12H    Continuous Infusions: . sodium chloride    . amiodarone (NEXTERONE PREMIX) 360 mg/200 mL dextrose 30 mg/hr (05/25/13 0454)  . heparin 1,300 Units/hr (05/25/13 0428)  . dialysis replacement fluid (prismasate) 250 mL/hr at 05/24/13 2043  . dialysis replacement fluid (prismasate) 250 mL/hr at 05/24/13 2127  . dialysate (PRISMASATE) 1,500 mL/hr at 05/24/13 2043    Jarold Motto MS, RD, LDN Pager: [phone] After-hours pager: [phone]

## 2013-05-25 NOTE — Progress Notes (Signed)
Pt with c/o urgency to urinate, urinal provided without noted output; bladder scan performed afterpation of bladder noted some firmness, bladder scan revealed no urine in the bladder; further inspection revealed Pt was incontinent in bed of urine, Pt cleaned and pad changed, Pt advised to notify staff of further urgency needs.

## 2013-05-25 NOTE — Progress Notes (Signed)
S:  Appetite poor,  Pain controlled O:BP 118/43  Pulse 65  Temp(Src) 98.7 F (37.1 C) (Oral)  Resp 22  Ht 6\' 1"  (1.854 m)  Wt 111.4 kg (245 lb 9.5 oz)  BMI 32.41 kg/m2  SpO2 99%  Intake/Output Summary (Last 24 hours) at 05/25/13 0817 Last data filed at 05/25/13 0800  Gross per 24 hour  Intake 1299.8 ml  Output   2699 ml  Net -1399.2 ml   Weight change: -2.8 kg (-6 lb 2.8 oz) Gen: Awake and alert CVS: RRR Resp: Decreased BS bases Abd:+ BS NTND Ext:3+ edema Rt 4+ Lt NEURO: CNI Ox3 no asterixis   . ipratropium  0.5 mg Nebulization Q6H   And  . albuterol  2.5 mg Nebulization Q6H  . antiseptic oral rinse  15 mL Mouth Rinse BID  . cefTAZidime (FORTAZ)  IV  1 g Intravenous Q12H  . chlorhexidine  15 mL Mouth/Throat BID  . darbepoetin (ARANESP) injection - NON-DIALYSIS  60 mcg Subcutaneous Q Tue-1800  . dextromethorphan-guaiFENesin  1 tablet Oral BID  . levothyroxine  25 mcg Intravenous Daily  . pantoprazole (PROTONIX) IV  40 mg Intravenous Q24H  . sodium chloride  10-40 mL Intracatheter Q12H   Dg Chest Port 1 View  05/24/2013   *RADIOLOGY REPORT*  Clinical Data: Follow up atelectasis  PORTABLE CHEST - 1 VIEW  Comparison: 05/23/2013  Findings: Pulmonary vascular congestion without frank interstitial edema.  Mild patchy left lower lobe opacity, atelectasis versus pneumonia.  Mild right basilar atelectasis. No pleural effusion or pneumothorax.  Stable right subclavian and left IJ venous catheter.  IMPRESSION: Patchy left lower lobe opacity, atelectasis versus pneumonia, unchanged.  Mild right basilar atelectasis.  Pulmonary vascular congestion without frank interstitial edema.   Original Report Authenticated By: Charline Bills, M.D.   BMET    Component Value Date/Time   NA 136 05/25/2013 0300   K 4.3 05/25/2013 0300   CL 98 05/25/2013 0300   CO2 26 05/25/2013 0300   GLUCOSE 129* 05/25/2013 0300   BUN 51* 05/25/2013 0300   CREATININE 3.39* 05/25/2013 0300   CALCIUM 7.7* 05/25/2013  0300   GFRNONAA 16* 05/25/2013 0300   GFRAA 19* 05/25/2013 0300   CBC    Component Value Date/Time   WBC 17.0* 05/25/2013 0300   RBC 3.41* 05/25/2013 0300   HGB 8.7* 05/25/2013 0300   HCT 26.7* 05/25/2013 0300   PLT 200 05/25/2013 0300   MCV 78.3 05/25/2013 0300   MCH 25.5* 05/25/2013 0300   MCHC 32.6 05/25/2013 0300   RDW 18.5* 05/25/2013 0300   LYMPHSABS 2.0 03/03/2011 0417   MONOABS 0.6 03/03/2011 0417   EOSABS 0.4 03/03/2011 0417   BASOSABS 0.0 03/03/2011 0417     Assessment: 1. Acute on CKD, UO negligible 2. SP CABG 3. Anemia on aranesp  Plan: 1.  Will see if will tolerate HD today to try to remove fluid 2. Start rena vite 3. Re check labs in AM   Zelene Barga T

## 2013-05-25 NOTE — Procedures (Signed)
Pt seen on HD.  Had an increase in HR that responded to some saline.  HR now low 100's.  SBP mostly above 90.

## 2013-05-25 NOTE — Evaluation (Signed)
Physical Therapy Evaluation Patient Details Name: [name] MRN: [id number] DOB: Sep 07, 1934 Today's Date: 05/25/2013 Time: 0347-4259 PT Time Calculation (min): 23 min  PT Assessment / Plan / Recommendation History of Present Illness  Pt transferred from [facility] with NSTEMI s/p CABG with elevated creatinine and lethargy, CVVHD x 3 days  Clinical Impression  Pt more alert today but continues to be limited with mobility with report of dizziness and nausea with activity although no vomiting and HR  77 throughout. Pt continues to demonstrate decreased awareness of precautions,  Mobility, activity tolerance and function. Pt will have 24hr assist at discharge but given slow progression and renal issues feel additional rehab prior to home with increase pt function and decrease burden of care and both pt and family are aware and agreeable to further therapy. Will follow acutely to maximize function and independence and decrease burden of care.     PT Assessment  Patient needs continued PT services    Follow Up Recommendations  SNF;Supervision/Assistance - 24 hour    Does the patient have the potential to tolerate intense rehabilitation      Barriers to Discharge        Equipment Recommendations  None recommended by PT    Recommendations for Other Services OT consult   Frequency Min 3X/week    Precautions / Restrictions Precautions Precautions: Sternal;Fall   Pertinent Vitals/Pain HR 77 throughout 6/10 RLQ pain 82-95 2-3L throughout, unable to get accurate reading throughout      Mobility  Bed Mobility Bed Mobility: Rolling Left;Left Sidelying to Sit Rolling Left: 3: Mod assist Left Sidelying to Sit: 3: Mod assist;HOB elevated (20 degrees) Sitting - Scoot to Edge of Bed: 3: Mod assist Details for Bed Mobility Assistance: cueing and assist for sequence, precautions and safety with use of pad to move sacrum Transfers Sit to Stand: 3: Mod assist;From bed;From elevated  surface Stand to Sit: 4: Min assist;To chair/3-in-1 Details for Transfer Assistance: cueing for hand placement, sequence, posture and safety. Pt trying to sit prior to fully backed to surface Ambulation/Gait Ambulation/Gait Assistance: 4: Min assist (+1 for chair and lines) Ambulation Distance (Feet): 50 Feet Assistive device: Rolling walker Ambulation/Gait Assistance Details: Pt with lean to left today with keeping RW off to right of pt with max cueing to step into RW, correct posture and look up. Pt with assist to direct and manage RW with max encouragement to ambulate Gait Pattern: Trunk flexed;Shuffle Gait velocity: decreased Stairs: No    Exercises General Exercises - Lower Extremity Long Arc Quad: AROM;20 reps;Both;Seated Hip Flexion/Marching: AROM;Both;20 reps;Seated   PT Diagnosis: Difficulty walking;Altered mental status  PT Problem List: Decreased activity tolerance;Decreased mobility;Decreased cognition;Decreased knowledge of use of DME;Decreased knowledge of precautions;Decreased safety awareness PT Treatment Interventions: DME instruction;Gait training;Stair training;Therapeutic exercise;Therapeutic activities;Functional mobility training;Cognitive remediation;Patient/family education     PT Goals(Current goals can be found in the care plan section) Acute Rehab PT Goals Patient Stated Goal: return to garden PT Goal Formulation: With patient/family Time For Goal Achievement: 06/08/13 Potential to Achieve Goals: Fair  Visit Information  Last PT Received On: 05/25/13 Assistance Needed: +2 (safety for gait) History of Present Illness: Pt transferred from [facility] with NSTEMI s/p CABG with elevated creatinine and lethargy, CVVHD x 3 days       Prior Functioning  Home Living Family/patient expects to be discharged to:: Private residence Living Arrangements: Spouse/significant other;Children (sons can help at DC 24hrs) Available Help at Discharge: Family;Available 24  hours/day Type of Home: House Home  Access: Stairs to enter [employer] of Steps: 3 Home Layout: One level Home Equipment: Walker - 2 wheels;Bedside commode;Wheelchair - manual Prior Function Level of Independence: Independent Communication Communication: No difficulties    Cognition  Cognition Arousal/Alertness: Awake/alert Behavior During Therapy: Flat affect Overall Cognitive Status: Impaired/Different from baseline Area of Impairment: Orientation Orientation Level: Disoriented to;Time Current Attention Level: Selective Memory: Decreased recall of precautions;Decreased short-term memory    Extremity/Trunk Assessment Upper Extremity Assessment Upper Extremity Assessment: Generalized weakness Lower Extremity Assessment Lower Extremity Assessment: Overall WFL for tasks assessed Cervical / Trunk Assessment Cervical / Trunk Assessment: Kyphotic   Balance    End of Session PT - End of Session Equipment Utilized During Treatment: Gait belt;Oxygen Activity Tolerance: Patient limited by fatigue Patient left: in chair;with call bell/phone within reach;with nursing/sitter in room Nurse Communication: Mobility status  GP     Toney Sang [facility] 05/25/2013, 11:08 AM Delaney Meigs, PT [phone]

## 2013-05-25 NOTE — Progress Notes (Signed)
ANTICOAGULATION CONSULT NOTE - Follow Up Consult  Pharmacy Consult for heparin Indication: atrial fibrillation and DVT  Labs:  Recent Labs  05/23/13 0400  05/24/13 0423 05/24/13 1500 05/25/13 0300 05/25/13 1550  HGB 8.8*  --  8.9*  --  8.7*  --   HCT 25.9*  --  26.5*  --  26.7*  --   PLT 278  --  228  --  200  --   APTT 154*  --  140*  --  134*  --   HEPARINUNFRC 0.40  --  0.42  --  0.26* 0.32  CREATININE 3.41*  < > 3.03* 2.72* 3.39*  --   < > = values in this interval not displayed.   Assessment: 79yo male now therapeutic on heparin   Goal of Therapy:  Heparin level 0.3-0.7 units/ml   Plan:  1) Continue heparin at 1300 units / hr 2) Follow up AM labs  Thank you. Okey Regal, PharmD  05/25/2013,5:00 PM

## 2013-05-25 NOTE — Progress Notes (Signed)
12 Days Post-Op Procedure(s) (LRB): CORONARY ARTERY BYPASS GRAFTING (CABG) (N/A) Subjective: C/o some nausea Multiple BM overnight  Objective: Vital signs in last 24 hours: Temp:  [97.5 F (36.4 C)-99.1 F (37.3 C)] 98.7 F (37.1 C) (07/21 0713) Pulse Rate:  [62-103] 65 (07/21 0700) Cardiac Rhythm:  [-] Normal sinus rhythm (07/20 2000) Resp:  [12-23] 20 (07/21 0700) BP: (93-124)/(40-92) 109/47 mmHg (07/21 0700) SpO2:  [91 %-100 %] 98 % (07/21 0700) Weight:  [245 lb 9.5 oz (111.4 kg)] 245 lb 9.5 oz (111.4 kg) (07/21 0600)  Hemodynamic parameters for last 24 hours:    Intake/Output from previous day: 07/20 0701 - 07/21 0700 In: 1306.8 [P.O.:240; I.V.:966.8; IV Piggyback:100] Out: 2887  Intake/Output this shift:    General appearance: alert and no distress Neurologic: intact Heart: regular rate and rhythm Lungs: diminished breath sounds bibasilar Abdomen: normal findings: bowel sounds normal and soft, non-tender Wound: clean and dry  Lab Results:  Recent Labs  05/24/13 0423 05/25/13 0300  WBC 20.8* 17.0*  HGB 8.9* 8.7*  HCT 26.5* 26.7*  PLT 228 200   BMET:  Recent Labs  05/24/13 1500 05/25/13 0300  NA 137 136  K 4.3 4.3  CL 99 98  CO2 25 26  GLUCOSE 116* 129*  BUN 42* 51*  CREATININE 2.72* 3.39*  CALCIUM 7.9* 7.7*    PT/INR: No results found for this basename: LABPROT, INR,  in the last 72 hours ABG    Component Value Date/Time   PHART 7.343* 05/16/2013 0409   HCO3 20.3 05/16/2013 0409   TCO2 21 05/16/2013 0409   ACIDBASEDEF 5.0* 05/16/2013 0409   O2SAT 90.0 05/16/2013 0409   CBG (last 3)  No results found for this basename: GLUCAP,  in the last 72 hours  Assessment/Plan: S/P Procedure(s) (LRB): CORONARY ARTERY BYPASS GRAFTING (CABG) (N/A) - CV- stable  On heparin gtt for a fib, convert to coumadin after any access procedure (if necessary)  RESP- stable  RENAL- CRRT stopped last PM when filter clogged  Had some unmeasured UO last PM -  incontinent  Hopefully can be managed with intermittent HD   ENDO- CBG well controlled  Deconditioning- PT consult/ OOB/ Ambulate   LOS: 13 days    Masiyah Jorstad C 05/25/2013

## 2013-05-25 NOTE — Progress Notes (Signed)
Rehab Admissions Coordinator Note:  Patient was screened by Trish Mage for appropriateness for an Inpatient Acute Rehab Consult.  Noted PT recommending SNF.  At this time, we are recommending Skilled Nursing Facility.  He will likely need some kind of post acute rehab services.  Lelon Frohlich M 05/25/2013, 11:15 AM  I can be reached at [phone].

## 2013-05-25 NOTE — Progress Notes (Signed)
ANTICOAGULATION CONSULT NOTE - Follow Up Consult  Pharmacy Consult for heparin Indication: atrial fibrillation and DVT  Labs:  Recent Labs  05/23/13 0400  05/24/13 0423 05/24/13 1500 05/25/13 0300  HGB 8.8*  --  8.9*  --  8.7*  HCT 25.9*  --  26.5*  --  26.7*  PLT 278  --  228  --  200  APTT 154*  --  140*  --  134*  HEPARINUNFRC 0.40  --  0.42  --  0.26*  CREATININE 3.41*  < > 3.03* 2.72* 3.39*  < > = values in this interval not displayed.   Assessment: 79yo male now slightly subtherapeutic on heparin after fairly stable levels at goal, no gtt issues overnight per RN.  Goal of Therapy:  Heparin level 0.3-0.7 units/ml   Plan:  Will increase heparin gtt by 1 unit/kg/hr to 1300 units/hr and check level in 8hr.  Vernard Gambles, PharmD, BCPS  05/25/2013,4:27 AM

## 2013-05-26 ENCOUNTER — Encounter (HOSPITAL_COMMUNITY): Payer: Self-pay | Admitting: Certified Registered"

## 2013-05-26 ENCOUNTER — Inpatient Hospital Stay (HOSPITAL_COMMUNITY): Payer: Medicare Other

## 2013-05-26 ENCOUNTER — Encounter (HOSPITAL_COMMUNITY): Payer: Self-pay | Admitting: Anesthesiology

## 2013-05-26 ENCOUNTER — Inpatient Hospital Stay (HOSPITAL_COMMUNITY): Payer: Medicare Other | Admitting: Certified Registered"

## 2013-05-26 ENCOUNTER — Encounter (HOSPITAL_COMMUNITY)
Admission: EM | Disposition: A | Discharge status: Rehabilitation Facility | Payer: Self-pay | Source: Other Acute Inpatient Hospital | Attending: Surgery

## 2013-05-26 HISTORY — PX: INSERTION OF DIALYSIS CATHETER: SHX1324

## 2013-05-26 LAB — CBC
HCT: 25 % — ABNORMAL LOW (ref 39.0–52.0)
Hemoglobin: 8.2 g/dL — ABNORMAL LOW (ref 13.0–17.0)
MCH: 26.2 pg (ref 26.0–34.0)
MCHC: 33.3 g/dL (ref 30.0–36.0)
MCV: 78.5 fL (ref 78.0–100.0)
MCV: 78.6 fL (ref 78.0–100.0)
Platelets: 79 10*3/uL — ABNORMAL LOW (ref 150–400)
Platelets: 93 10*3/uL — ABNORMAL LOW (ref 150–400)
RBC: 3.18 MIL/uL — ABNORMAL LOW (ref 4.22–5.81)
RDW: 19.1 % — ABNORMAL HIGH (ref 11.5–15.5)
WBC: 19.3 10*3/uL — ABNORMAL HIGH (ref 4.0–10.5)
WBC: 16.5 10*3/uL — ABNORMAL HIGH (ref 4.0–10.5)

## 2013-05-26 LAB — RENAL FUNCTION PANEL
Albumin: 2.1 g/dL — ABNORMAL LOW (ref 3.5–5.2)
BUN: 43 mg/dL — ABNORMAL HIGH (ref 6–23)
Calcium: 8 mg/dL — ABNORMAL LOW (ref 8.4–10.5)
Creatinine, Ser: 4.02 mg/dL — ABNORMAL HIGH (ref 0.50–1.35)
Phosphorus: 4.4 mg/dL (ref 2.3–4.6)
Potassium: 4 mEq/L (ref 3.5–5.1)

## 2013-05-26 SURGERY — INSERTION OF DIALYSIS CATHETER
Anesthesia: Monitor Anesthesia Care | Site: Neck | Wound class: Clean

## 2013-05-26 MED ORDER — DEXTROSE 5 % IV SOLN
2.0000 g | INTRAVENOUS | Status: DC
  Administered 2013-05-26: 2 g via INTRAVENOUS
  Filled 2013-05-26 (×3): qty 2

## 2013-05-26 MED ORDER — PROPOFOL INFUSION 10 MG/ML OPTIME
INTRAVENOUS | Status: DC | PRN
  Administered 2013-05-26: 50 ug/kg/min via INTRAVENOUS

## 2013-05-26 MED ORDER — HALOPERIDOL LACTATE 5 MG/ML IJ SOLN
2.0000 mg | Freq: Four times a day (QID) | INTRAMUSCULAR | Status: DC | PRN
  Administered 2013-05-26: 2 mg via INTRAVENOUS
  Filled 2013-05-26: qty 0.4
  Filled 2013-05-26: qty 1

## 2013-05-26 MED ORDER — HEPARIN SODIUM (PORCINE) 1000 UNIT/ML IJ SOLN
INTRAMUSCULAR | Status: DC | PRN
  Administered 2013-05-26: 10 mL via INTRAVENOUS

## 2013-05-26 MED ORDER — LIDOCAINE HCL (PF) 1 % IJ SOLN
INTRAMUSCULAR | Status: DC | PRN
  Administered 2013-05-26: 30 mL

## 2013-05-26 MED ORDER — 0.9 % SODIUM CHLORIDE (POUR BTL) OPTIME
TOPICAL | Status: DC | PRN
  Administered 2013-05-26: 1000 mL

## 2013-05-26 MED ORDER — ARGATROBAN 50 MG/50ML IV SOLN
0.5000 ug/kg/min | INTRAVENOUS | Status: DC
  Filled 2013-05-26: qty 50

## 2013-05-26 MED ORDER — LIDOCAINE HCL (PF) 1 % IJ SOLN
INTRAMUSCULAR | Status: AC
  Filled 2013-05-26: qty 30

## 2013-05-26 MED ORDER — HEPARIN SODIUM (PORCINE) 1000 UNIT/ML IJ SOLN
INTRAMUSCULAR | Status: AC
  Filled 2013-05-26: qty 1

## 2013-05-26 MED ORDER — FENTANYL CITRATE 0.05 MG/ML IJ SOLN
25.0000 ug | INTRAMUSCULAR | Status: DC | PRN
  Administered 2013-05-26: 25 ug via INTRAVENOUS
  Filled 2013-05-26: qty 2

## 2013-05-26 MED ORDER — METOCLOPRAMIDE HCL 5 MG/ML IJ SOLN
10.0000 mg | Freq: Once | INTRAMUSCULAR | Status: AC | PRN
  Filled 2013-05-26: qty 2

## 2013-05-26 MED ORDER — SODIUM CHLORIDE 0.9 % IR SOLN
Status: DC | PRN
  Administered 2013-05-26: 14:00:00

## 2013-05-26 MED ORDER — FENTANYL CITRATE 0.05 MG/ML IJ SOLN
INTRAMUSCULAR | Status: DC | PRN
  Administered 2013-05-26: 50 ug via INTRAVENOUS

## 2013-05-26 MED ORDER — MIDAZOLAM HCL 5 MG/5ML IJ SOLN
INTRAMUSCULAR | Status: DC | PRN
  Administered 2013-05-26: 1 mg via INTRAVENOUS

## 2013-05-26 MED ORDER — HEPARIN SOD (PORK) LOCK FLUSH 100 UNIT/ML IV SOLN
2600.0000 [IU] | Freq: Once | INTRAVENOUS | Status: AC
  Administered 2013-05-26: 2600 [IU] via INTRAVENOUS

## 2013-05-26 SURGICAL SUPPLY — 45 items
BAG DECANTER FOR FLEXI CONT (MISCELLANEOUS) ×2 IMPLANT
BIOPATCH BLUE 3/4IN DISK W/1.5 (GAUZE/BANDAGES/DRESSINGS) ×2 IMPLANT
CATH CANNON HEMO 15F 50CM (CATHETERS) IMPLANT
CATH CANNON HEMO 15FR 19 (HEMODIALYSIS SUPPLIES) IMPLANT
CATH CANNON HEMO 15FR 23CM (HEMODIALYSIS SUPPLIES) ×1 IMPLANT
CATH CANNON HEMO 15FR 31CM (HEMODIALYSIS SUPPLIES) IMPLANT
CATH CANNON HEMO 15FR 32 (HEMODIALYSIS SUPPLIES) IMPLANT
CATH CANNON HEMO 15FR 32CM (HEMODIALYSIS SUPPLIES) IMPLANT
CATH STRAIGHT 5FR 65CM (CATHETERS) IMPLANT
CHLORAPREP W/TINT 26ML (MISCELLANEOUS) ×2 IMPLANT
CLOTH BEACON ORANGE TIMEOUT ST (SAFETY) ×2 IMPLANT
COVER PROBE W GEL 5X96 (DRAPES) IMPLANT
COVER SURGICAL LIGHT HANDLE (MISCELLANEOUS) ×2 IMPLANT
DECANTER SPIKE VIAL GLASS SM (MISCELLANEOUS) ×2 IMPLANT
DRAPE C-ARM 42X72 X-RAY (DRAPES) ×2 IMPLANT
DRAPE CHEST BREAST 15X10 FENES (DRAPES) ×2 IMPLANT
GAUZE SPONGE 2X2 8PLY STRL LF (GAUZE/BANDAGES/DRESSINGS) IMPLANT
GAUZE SPONGE 4X4 16PLY XRAY LF (GAUZE/BANDAGES/DRESSINGS) ×2 IMPLANT
GLOVE BIO SURGEON STRL SZ 6.5 (GLOVE) ×2 IMPLANT
GLOVE BIO SURGEON STRL SZ7.5 (GLOVE) ×2 IMPLANT
GOWN PREVENTION PLUS XLARGE (GOWN DISPOSABLE) ×2 IMPLANT
GOWN STRL NON-REIN LRG LVL3 (GOWN DISPOSABLE) ×4 IMPLANT
KIT BASIN OR (CUSTOM PROCEDURE TRAY) ×2 IMPLANT
KIT ROOM TURNOVER OR (KITS) ×2 IMPLANT
NDL 18GX1X1/2 (RX/OR ONLY) (NEEDLE) ×1 IMPLANT
NDL HYPO 25GX1X1/2 BEV (NEEDLE) ×1 IMPLANT
NEEDLE 18GX1X1/2 (RX/OR ONLY) (NEEDLE) ×2 IMPLANT
NEEDLE HYPO 25GX1X1/2 BEV (NEEDLE) ×2 IMPLANT
NS IRRIG 1000ML POUR BTL (IV SOLUTION) ×2 IMPLANT
PACK SURGICAL SETUP 50X90 (CUSTOM PROCEDURE TRAY) ×2 IMPLANT
PAD ARMBOARD 7.5X6 YLW CONV (MISCELLANEOUS) ×4 IMPLANT
SET MICROPUNCTURE 5F STIFF (MISCELLANEOUS) IMPLANT
SPONGE GAUZE 2X2 STER 10/PKG (GAUZE/BANDAGES/DRESSINGS) ×1
SUT ETHILON 3 0 PS 1 (SUTURE) ×2 IMPLANT
SUT VICRYL 4-0 PS2 18IN ABS (SUTURE) ×2 IMPLANT
SYR 20CC LL (SYRINGE) ×4 IMPLANT
SYR 30ML LL (SYRINGE) IMPLANT
SYR 5ML LL (SYRINGE) ×4 IMPLANT
SYR CONTROL 10ML LL (SYRINGE) ×2 IMPLANT
SYRINGE 10CC LL (SYRINGE) ×2 IMPLANT
TAPE CLOTH SURG 4X10 WHT LF (GAUZE/BANDAGES/DRESSINGS) ×1 IMPLANT
TOWEL OR 17X24 6PK STRL BLUE (TOWEL DISPOSABLE) ×2 IMPLANT
TOWEL OR 17X26 10 PK STRL BLUE (TOWEL DISPOSABLE) ×2 IMPLANT
WATER STERILE IRR 1000ML POUR (IV SOLUTION) ×2 IMPLANT
WIRE AMPLATZ SS-J .035X180CM (WIRE) IMPLANT

## 2013-05-26 NOTE — Op Note (Signed)
Procedure: Ultrasound-guided insertion of Diatek catheter  Preoperative diagnosis: End-stage renal disease  Postoperative diagnosis: Same  Anesthesia: Local with IV sedation  Operative findings: 23 cm Diatek catheter right internal jugular vein  Operative details: After obtaining informed consent, the patient was taken to the operating room. The patient was placed in supine position on the operating room table. After adequate sedation the patient's entire neck and chest were prepped and draped in usual sterile fashion. The patient was placed in Trendelenburg position. Ultrasound was used to identify the patient's right internal jugular vein. This had normal compressibility and respiratory variation. Local anesthesia was infiltrated over the right jugular vein.  Using ultrasound guidance, the right internal jugular vein was successfully cannulated.  A 0.035 J-tipped guidewire was threaded into the right internal jugular vein and into the superior vena cava followed by the inferior vena cava under fluoroscopic guidance.   Next sequential 12 and 14 dilators were placed over the guidewire into the right atrium.  A 16 French dilator with a peel-away sheath was then placed over the guidewire into the right atrium.   The guidewire and dilator were removed. A 23 cm Diatek catheter was then placed through the peel away sheath into the right atrium.  The catheter was then tunneled subcutaneously, cut to length, and the hub attached. The catheter was noted to flush and draw easily. The catheter was inspected under fluoroscopy and found with its tip to be in the right atrium without any kinks throughout its course. The catheter was sutured to the skin with nylon sutures. The neck insertion site was closed with Vicryl stitch. The catheter was then loaded with concentrated Heparin solution. A dry sterile dressing was applied.  The patient tolerated procedure well and there were no complications. Instrument sponge and  needle counts correct in the case. The patient was taken to the recovery room in stable condition. Chest x-ray will be obtained in the recovery room.  Letia Guidry, MD [facility] Office: [phone] Pager: [phone] 

## 2013-05-26 NOTE — Progress Notes (Deleted)
CRITICAL VALUE ALERT  Critical value received:  Potassium 7.0   Date of notification: 05-27-2003   Time of notification:  0755  Critical value read back: yes  Nurse who received alert: Francene Boyers  MD notified (1st page):  Dr. Darrick Penna  Time of first page:  [phone]  MD notified (2nd page): not needed  Time of second page:  Responding MD: Dr. Darrick Penna  Time MD responded:  [phone]

## 2013-05-26 NOTE — Preoperative (Signed)
Beta Blockers   Reason not to administer Beta Blockers:Not Applicable 

## 2013-05-26 NOTE — Progress Notes (Signed)
SLP Cancellation Note  Patient Details Name: [name] MRN: [id number] DOB: 1934/08/08   Cancelled treatment:        Goal of therapy was to observe with trials of thin liquid for possible upgrade.  Pt. Has been saying he is leaving since last night.  He refused to attempt coffee with max encouragement and asked this SLP "who do I need to talk to to get out of here?"  SLP will attempt tomorrow.  [name].Ed [employer] [phone]  05/26/2013

## 2013-05-26 NOTE — Progress Notes (Signed)
S:  Only able to remove about 2 liters with HD yest due to low bp and tachtcardia.  Appetite remains marginal.  Says he is going home today? O:BP 116/47  Pulse 93  Temp(Src) 98 F (36.7 C) (Oral)  Resp 22  Ht 6\' 1"  (1.854 m)  Wt 108.4 kg (238 lb 15.7 oz)  BMI 31.54 kg/m2  SpO2 98%  Intake/Output Summary (Last 24 hours) at 05/26/13 0745 Last data filed at 05/26/13 0700  Gross per 24 hour  Intake 1169.8 ml  Output   2305 ml  Net -1135.2 ml   Weight change: 0 kg (0 lb) Gen: Awake and alert CVS: RRR Resp: clear Abd:+ BS NTND Ext:2+ edema Rt 3+ Lt NEURO: CNI no asterixis L IJ temp HD cath   . ipratropium  0.5 mg Nebulization Q6H   And  . albuterol  2.5 mg Nebulization Q6H  . ceFEPime (MAXIPIME) IV  1 g Intravenous Q24H  . darbepoetin (ARANESP) injection - NON-DIALYSIS  60 mcg Subcutaneous Q Tue-1800  . dextromethorphan-guaiFENesin  1 tablet Oral BID  . feeding supplement  1 Container Oral TID BM  . levothyroxine  25 mcg Intravenous Daily  . multivitamin  1 tablet Oral Daily  . pantoprazole (PROTONIX) IV  40 mg Intravenous Q24H  . sodium chloride  10-40 mL Intracatheter Q12H   No results found. BMET    Component Value Date/Time   NA 138 05/26/2013 0410   K 4.0 05/26/2013 0410   CL 99 05/26/2013 0410   CO2 25 05/26/2013 0410   GLUCOSE 102* 05/26/2013 0410   BUN 43* 05/26/2013 0410   CREATININE 4.02* 05/26/2013 0410   CALCIUM 8.0* 05/26/2013 0410   GFRNONAA 13* 05/26/2013 0410   GFRAA 15* 05/26/2013 0410   CBC    Component Value Date/Time   WBC 19.3* 05/26/2013 0410   RBC 3.21* 05/26/2013 0410   HGB 8.4* 05/26/2013 0410   HCT 25.2* 05/26/2013 0410   PLT 79* 05/26/2013 0410   MCV 78.5 05/26/2013 0410   MCH 26.2 05/26/2013 0410   MCHC 33.3 05/26/2013 0410   RDW 19.1* 05/26/2013 0410   LYMPHSABS 2.0 03/03/2011 0417   MONOABS 0.6 03/03/2011 0417   EOSABS 0.4 03/03/2011 0417   BASOSABS 0.0 03/03/2011 0417     Assessment: 1. Acute on CKD, UO negligible 2. SP CABG 3. Anemia on  aranesp  Plan: 1.  Plan HD again today 2. Re check labs in AM 3. DC pm labs and am Mg 4.  Will need perm cath.  I will call   [name]

## 2013-05-26 NOTE — Progress Notes (Signed)
ANTICOAGULATION CONSULT NOTE - Follow Up Consult  Pharmacy Consult for Argatroban Indication: atrial fibrillation, recent DVT  No Known Allergies  Patient Measurements: Height: 6\' 1"  (185.4 cm) Weight: 238 lb 15.7 oz (108.4 kg) IBW/kg (Calculated) : 79.9 Heparin Dosing Weight: 107 kg  Vital Signs: Temp: 97.7 F (36.5 C) (07/22 0812) Temp src: Oral (07/22 0812) BP: 109/46 mmHg (07/22 0800) Pulse Rate: 61 (07/22 0800)  Labs:  Recent Labs  05/24/13 0423  05/25/13 0300 05/25/13 1550 05/25/13 1551 05/26/13 0410 05/26/13 0700  HGB 8.9*  --  8.7*  --   --  8.4*  --   HCT 26.5*  --  26.7*  --   --  25.2*  --   PLT 228  --  200  --   --  79*  --   APTT 140*  --  134*  --   --   --  117*  HEPARINUNFRC 0.42  --  0.26* 0.32  --   --  0.44  CREATININE 3.03*  < > 3.39*  --  2.47* 4.02*  --   < > = values in this interval not displayed.  Estimated Creatinine Clearance: 19.9 ml/min (by C-G formula based on Cr of 4.02).  Assessment: tx from [facility] with CP=NSTEMI. Emergent CABG 7/9  Anticoagulation: Heparin for recent LLE DVT (04/23/13) and Afib/flutter while PTA warfarin on hold (Home dose: 5 mg daily. Last dose 7/8 am.). HL = 0.44. Hgb down to 8.4. Plts 200 to 79 this am? Repeat per Dr. Dorris Fetch: plts 93. Instructed to check HIT panel and change anticoagulant to Argatroban.  Infectious Disease: Afebrile. WBC 19.3 up. Sputum cx + Enterobacter. Change to Cefepime. ESRD  7/21 Cefepime>> 7/20 Ceftaz>>7/21 Vanc 7/9 >> 7/15 *VT (7/13): 26.1  Zosyn 7/9 >> 7/15  7/19: Sputum: Enterobacter aerogenes 7/10 UCx >> NG   Goal of Therapy:  APTT 50-90 s Monitor platelets by anticoagulation protocol: Yes   Plan:  1. Cefepime 2g IV MWF after HD. Plan HD again today 2. Argatroban 0.57mcg/kg/min= 54.2 mcg/min=3.3 ml/hr 3. APTT in 2 hrs.  Sie Formisano S. Merilynn Finland, PharmD, BCPS Clinical Staff Pharmacist Pager [phone]  Misty Stanley Stillinger 05/26/2013,10:33 AM

## 2013-05-26 NOTE — Anesthesia Procedure Notes (Signed)
Procedure Name: MAC Date/Time: 05/26/2013 1:37 PM Performed by: Ellin Goodie Pre-anesthesia Checklist: Patient identified, Emergency Drugs available, Suction available, Patient being monitored and Timeout performed Patient Re-evaluated:Patient Re-evaluated prior to inductionOxygen Delivery Method: Simple face mask Preoxygenation: Pre-oxygenation with 100% oxygen Intubation Type: IV induction Placement Confirmation: breath sounds checked- equal and bilateral and positive ETCO2 Dental Injury: Teeth and Oropharynx as per pre-operative assessment

## 2013-05-26 NOTE — Progress Notes (Signed)
Pt transported to OR by RN. Pt VS stable at time of transfer. Bedside report given to [facility], Charity fundraiser. Yohannes Waibel L

## 2013-05-26 NOTE — Progress Notes (Addendum)
TCTS DAILY ICU PROGRESS NOTE                   [address]            [place] [address]          [phone]   13 Days Post-Op Procedure(s) (LRB): CORONARY ARTERY BYPASS GRAFTING (CABG) (N/A)  Total Length of Stay:  LOS: 14 days   Subjective: Feels better, some chest incisional discomfort  Objective: Vital signs in last 24 hours: Temp:  [97.4 F (36.3 C)-98.7 F (37.1 C)] 98 F (36.7 C) (07/22 0400) Pulse Rate:  [60-119] 65 (07/22 0630) Cardiac Rhythm:  [-] Normal sinus rhythm (07/22 0400) Resp:  [9-24] 18 (07/22 0630) BP: (80-118)/(31-69) 95/48 mmHg (07/22 0630) SpO2:  [91 %-100 %] 98 % (07/22 0630) Weight:  [238 lb 15.7 oz (108.4 kg)-245 lb 9.5 oz (111.4 kg)] 238 lb 15.7 oz (108.4 kg) (07/22 0630)  Filed Weights   05/25/13 1050 05/25/13 1503 05/26/13 0630  Weight: 245 lb 9.5 oz (111.4 kg) 240 lb 15.4 oz (109.3 kg) 238 lb 15.7 oz (108.4 kg)    Weight change: 0 lb (0 kg)   Hemodynamic parameters for last 24 hours:    Intake/Output from previous day: 07/21 0701 - 07/22 0700 In: 1169.8 [P.O.:120; I.V.:949.8; IV Piggyback:100] Out: 2305   Intake/Output this shift:    Current Meds: Scheduled Meds: . ipratropium  0.5 mg Nebulization Q6H   And  . albuterol  2.5 mg Nebulization Q6H  . ceFEPime (MAXIPIME) IV  1 g Intravenous Q24H  . darbepoetin (ARANESP) injection - NON-DIALYSIS  60 mcg Subcutaneous Q Tue-1800  . dextromethorphan-guaiFENesin  1 tablet Oral BID  . feeding supplement  1 Container Oral TID BM  . levothyroxine  25 mcg Intravenous Daily  . multivitamin  1 tablet Oral Daily  . pantoprazole (PROTONIX) IV  40 mg Intravenous Q24H  . sodium chloride  10-40 mL Intracatheter Q12H   Continuous Infusions: . sodium chloride    . amiodarone (NEXTERONE PREMIX) 360 mg/200 mL dextrose 30 mg/hr (05/26/13 0700)  . heparin 1,300 Units/hr (05/26/13 0700)  . dialysis replacement fluid (prismasate) 250 mL/hr at 05/24/13 2043  . dialysis replacement  fluid (prismasate) 250 mL/hr at 05/24/13 2127  . dialysate (PRISMASATE) 1,500 mL/hr at 05/24/13 2043   PRN Meds:.acetaminophen, food thickener, ondansetron (ZOFRAN) IV, sodium chloride, sodium chloride  General appearance: alert, distracted and no distress Heart: regular rate and rhythm Lungs: clear to auscultation bilaterally Abdomen: soft, nontender Extremities: + BLE edema Wound: healing well  Lab Results: CBC: Recent Labs  05/25/13 0300 05/26/13 0410  WBC 17.0* 19.3*  HGB 8.7* 8.4*  HCT 26.7* 25.2*  PLT 200 79*   BMET:  Recent Labs  05/25/13 1551 05/26/13 0410  NA 136 138  K 3.4* 4.0  CL 99 99  CO2 24 25  GLUCOSE 103* 102*  BUN 28* 43*  CREATININE 2.47* 4.02*  CALCIUM 7.8* 8.0*    PT/INR: No results found for this basename: LABPROT, INR,  in the last 72 hours Radiology: Dg Chest Port 1 View  05/24/2013   *RADIOLOGY REPORT*  Clinical Data: Follow up atelectasis  PORTABLE CHEST - 1 VIEW  Comparison: 05/23/2013  Findings: Pulmonary vascular congestion without frank interstitial edema.  Mild patchy left lower lobe opacity, atelectasis versus pneumonia.  Mild right basilar atelectasis. No pleural effusion or pneumothorax.  Stable right subclavian and left IJ venous catheter.  IMPRESSION: Patchy left lower lobe opacity, atelectasis  versus pneumonia, unchanged.  Mild right basilar atelectasis.  Pulmonary vascular congestion without frank interstitial edema.   Original Report Authenticated By: Charline Bills, M.D.     Assessment/Plan: S/P Procedure(s) (LRB): CORONARY ARTERY BYPASS GRAFTING (CABG) (N/A)  1 Doing better, more alert than has been, mostly oriented but poor memory of recent events 2 nephrology managing renal issues 3 cont  maxepime for + enterobacter in sputum, CXR fairly stable in appearance, WBC increased 4 sugars controlled 5 on heparin, appears to be maintaining NSR currently, had some afib in dialysis, ? Timing of convert to coumadin   GOLD,WAYNE  E 05/26/2013 7:30 AM  He states adamantly that he is going home today. I explained to him the medical reasons he is not ready to go home yet but he continues to state that he is going home no matter what.  He is still cooperative but will order PRN Haldol in case he becomes more agitated/ aggressive  HD today  Plan to start coumadin after permcath placed if it appears that he will be able to safely manage it. At present I think his risk with coumadin might outweigh the benefit

## 2013-05-26 NOTE — Transfer of Care (Signed)
Immediate Anesthesia Transfer of Care Note  Patient: [name]  Procedure(s) Performed: Procedure(s) with comments: INSERTION OF DIALYSIS CATHETER (N/A) - right IJ  Patient Location: PACU  Anesthesia Type:MAC  Level of Consciousness: awake, alert  and oriented  Airway & Oxygen Therapy: Patient connected to face mask  Post-op Assessment: Report given to PACU RN  Post vital signs: stable  Complications: No apparent anesthesia complications

## 2013-05-26 NOTE — Progress Notes (Signed)
Patient ID: [name], male   DOB: [birth year], 79 y.o.   MRN: [id number] EVENING ROUNDS NOTE :     [address]       [name] [address]             [phone]                 Day of Surgery Procedure(s) (LRB): INSERTION OF DIALYSIS CATHETER (N/A)  Total Length of Stay:  LOS: 14 days  BP 111/54  Pulse 58  Temp(Src) 98.7 F (37.1 C) (Oral)  Resp 12  Ht 6\' 1"  (1.854 m)  Wt 238 lb 15.7 oz (108.4 kg)  BMI 31.54 kg/m2  SpO2 100%  .Intake/Output     07/21 0701 - 07/22 0700 07/22 0701 - 07/23 0700   P.O. 120    I.V. (mL/kg) 949.8 (8.8) 210 (1.9)   IV Piggyback 100    Total Intake(mL/kg) 1169.8 (10.8) 210 (1.9)   Other 2305    Total Output 2305     Net -1135.2 +210          . sodium chloride    . amiodarone (NEXTERONE PREMIX) 360 mg/200 mL dextrose 30 mg/hr (05/26/13 1700)     Lab Results  Component Value Date   WBC 16.5* 05/26/2013   HGB 8.2* 05/26/2013   HCT 25.0* 05/26/2013   PLT 93* 05/26/2013   GLUCOSE 102* 05/26/2013   CHOL 172 05/13/2013   TRIG 141 05/13/2013   HDL 47 05/13/2013   LDLCALC 97 05/13/2013   ALT 36 05/21/2013   AST 77* 05/21/2013   NA 138 05/26/2013   K 4.0 05/26/2013   CL 99 05/26/2013   CREATININE 4.02* 05/26/2013   BUN 43* 05/26/2013   CO2 25 05/26/2013   TSH 10.857* 05/12/2013   INR 2.43* 05/15/2013   Confused today, dialysis done this afternoon Hr 62 on Cordarone, off heparin now, resume coumadin in am   Delight Ovens MD  Beeper [phone] Office [phone] 05/26/2013 5:54 PM

## 2013-05-26 NOTE — Anesthesia Preprocedure Evaluation (Addendum)
Anesthesia Evaluation  Patient identified by MRN, date of birth, ID band Patient awake    Reviewed: Allergy & Precautions, H&P , NPO status , Patient's Chart, lab work & pertinent test results, reviewed documented beta blocker date and time   Airway Mallampati: II TM Distance: >3 FB Neck ROM: full    Dental   Pulmonary pneumonia -, resolved,  breath sounds clear to auscultation        Cardiovascular hypertension, On Medications - angina+ Past MI, + CABG and +CHF Rhythm:regular     Neuro/Psych PSYCHIATRIC DISORDERS negative neurological ROS     GI/Hepatic negative GI ROS, Neg liver ROS,   Endo/Other  negative endocrine ROS  Renal/GU ARFnegative Renal ROS  negative genitourinary   Musculoskeletal   Abdominal   Peds  Hematology negative hematology ROS (+)   Anesthesia Other Findings See surgeon's H&P   Reproductive/Obstetrics negative OB ROS                           Anesthesia Physical Anesthesia Plan  ASA: III  Anesthesia Plan: MAC   Post-op Pain Management:    Induction: Intravenous  Airway Management Planned: Simple Face Mask  Additional Equipment:   Intra-op Plan:   Post-operative Plan:   Informed Consent: I have reviewed the patients History and Physical, chart, labs and discussed the procedure including the risks, benefits and alternatives for the proposed anesthesia with the patient or authorized representative who has indicated his/her understanding and acceptance.   Dental Advisory Given  Plan Discussed with: CRNA and Surgeon  Anesthesia Plan Comments:         Anesthesia Quick Evaluation

## 2013-05-26 NOTE — Consult Note (Signed)
[facility] [place] CONSULT NOTE 05/26/2013 DOB: 11/26/1933 MRN : [id number]  NW:GNFA Referring physician: Dr. Briant Cedar  History of Present Illness: [name] is a 79 y.o. male with hx CA, HTN and CAD who had CABG by Dr. Laneta Simmers on 05/13/13. Pt developed acute renal failure post-op and has been dialysing through left IJ temp catheter. We were asked to place a diatek catheter as it appears pt will need HD in the immediate future. Wife is at bedside. Pt has been confused and is sedated with Haldol. Procedure has been explained to his wife who shows understanding and will sign consent.   Past Medical History  Diagnosis Date  . Cancer   . Hypertension   . Collagen vascular disease   . Anginal pain   . Depression   . Arthritis     Past Surgical History  Procedure Laterality Date  . Eye surgery    . Coronary artery bypass graft N/A 05/13/2013    Procedure: CORONARY ARTERY BYPASS GRAFTING (CABG);  Surgeon: Alleen Borne, MD;  Location: [facility] OR;  Service: Open Heart Surgery;  Laterality: N/A;    Social History History  Substance Use Topics  . Smoking status: Never Smoker   . Smokeless tobacco: Not on file  . Alcohol Use: No    Family History Mothe - CAD Father-  CKD  No Known Allergies  Current Facility-Administered Medications  Medication Dose Route Frequency Provider Last Rate Last Dose  . 0.9 %  sodium chloride infusion  250 mL Intravenous Continuous Erin Barrett, PA-C      . acetaminophen (TYLENOL) tablet 650 mg  650 mg Oral Q4H PRN Loreli Slot, MD   650 mg at 05/24/13 2116  . ipratropium (ATROVENT) nebulizer solution 0.5 mg  0.5 mg Nebulization Q6H Coralyn Helling, MD   0.5 mg at 05/26/13 0817   And  . albuterol (PROVENTIL) (5 MG/ML) 0.5% nebulizer solution 2.5 mg  2.5 mg Nebulization Q6H Coralyn Helling, MD   2.5 mg at 05/26/13 0817  . amiodarone (NEXTERONE PREMIX) 360 mg/200 mL dextrose IV infusion  30 mg/hr Intravenous Continuous Alleen Borne, MD 16.7 mL/hr at 05/26/13 0800 30 mg/hr at 05/26/13 0800  . ceFEPIme (MAXIPIME) 1 g in dextrose 5 % 50 mL IVPB  1 g Intravenous Q24H Wilmon Pali, PA-C   1 g at 05/25/13 1743  . darbepoetin (ARANESP) injection 60 mcg  60 mcg Subcutaneous Q Tue-1800 Cecille Aver, MD      . dextromethorphan-guaiFENesin [facility] DM) 30-600 MG per 12 hr tablet 1 tablet  1 tablet Oral BID Loreli Slot, MD   1 tablet at 05/26/13 0915  . feeding supplement (ENSURE) pudding 1 Container  1 Container Oral TID BM Haynes Bast, RD   1 Container at 05/26/13 [phone]  . food thickener (THICK IT) powder   Oral PRN Alleen Borne, MD      . haloperidol lactate (HALDOL) injection 2 mg  2 mg Intravenous Q6H PRN Loreli Slot, MD   2 mg at 05/26/13 0916  . heparin ADULT infusion 100 units/mL (25000 units/250 mL)  1,300 Units/hr Intravenous Continuous Colleen Can, RPH 13 mL/hr at 05/26/13 0800 1,300 Units/hr at 05/26/13 0800  . levothyroxine (SYNTHROID, LEVOTHROID) injection 25 mcg  25 mcg Intravenous Daily Alleen Borne, MD   25 mcg at 05/25/13 1028  . multivitamin (RENA-VIT) tablet 1 tablet  1 tablet Oral Daily Dyke Maes, MD   1 tablet  at 05/25/13 1030  . ondansetron (ZOFRAN) injection 4 mg  4 mg Intravenous Q6H PRN Erin Barrett, PA-C   4 mg at 05/25/13 1139  . pantoprazole (PROTONIX) injection 40 mg  40 mg Intravenous Q24H Alleen Borne, MD   40 mg at 05/26/13 0753  . sodium chloride 0.9 % injection 10-40 mL  10-40 mL Intracatheter Q12H Alleen Borne, MD   30 mL at 05/26/13 0916  . sodium chloride 0.9 % injection 10-40 mL  10-40 mL Intracatheter PRN Alleen Borne, MD   10 mL at 05/25/13 0316    ROS: [x]  Positive  [ ]  Denies    General: [ ]  Weight loss, [ ]  Fever, [ ]  chills Neurologic: [ ]  Dizziness, [ ]  Blackouts, [ ]  Seizure [ ]  Stroke, [ ]  "Mini stroke", [ ]  Slurred speech, [ ]  Temporary blindness; [ ]  weakness in arms or legs, [ ]  Hoarseness [ ]  Dysphagia Cardiac: [x]   Chest pain/pressure, [ ]  Shortness of breath at rest [ ]  Shortness of breath with exertion, [x ] Atrial fibrillation or irregular heartbeat  Vascular: [ ]  Pain in legs with walking, [ ]  Pain in legs at rest, [ ]  Pain in legs at night,  [ ]  Non-healing ulcer, [ ]  Blood clot in vein/DVT,   Pulmonary: [ ]  Home oxygen, [ ]  Productive cough, [ ]  Coughing up blood, [ ]  Asthma,  [ ]  Wheezing [ ]  COPD Musculoskeletal:  [ ]  Arthritis, [ ]  Low back pain, [ ]  Joint pain Hematologic: [ ]  Easy Bruising, [ ]  Anemia; [ ]  Hepatitis Gastrointestinal: [ ]  Blood in stool, [ ]  Gastroesophageal Reflux/heartburn, Urinary: [ ]  chronic Kidney disease, [ ]  on HD - [ ]  MWF or [ ]  TTHS, [ ]  Burning with urination, [ ]  Difficulty urinating Skin: [ ]  Rashes, [ ]  Wounds Psychological: [ ]  Anxiety, [ ]  Depression  Physical Examination Filed Vitals:   05/26/13 0707 05/26/13 0800 05/26/13 0812 05/26/13 0818  BP: 116/47 109/46    Pulse: 93 61    Temp:   97.7 F (36.5 C)   TempSrc:   Oral   Resp: 22 19    Height:      Weight:      SpO2: 98% 100%  100%   Body mass index is 31.54 kg/(m^2).  General:  WDWN in NAD, sedate on Haldol HENT: WNL Eyes: Pupils equal Pulmonary: normal non-labored breathing Cardiac: RRR,  Skin: no rashes, ulcers noted Vascular Exam/Pulses: palp radial pulses  Extremities without ischemic changes, no Gangrene , no cellulitis; no open wounds;  Musculoskeletal: no muscle wasting or atrophy  Neurologic: sedate awakens to name   Significant Diagnostic Studies: CBC Lab Results  Component Value Date   WBC 19.3* 05/26/2013   HGB 8.4* 05/26/2013   HCT 25.2* 05/26/2013   MCV 78.5 05/26/2013   PLT 79* 05/26/2013    BMET    Component Value Date/Time   NA 138 05/26/2013 0410   K 4.0 05/26/2013 0410   CL 99 05/26/2013 0410   CO2 25 05/26/2013 0410   GLUCOSE 102* 05/26/2013 0410   BUN 43* 05/26/2013 0410   CREATININE 4.02* 05/26/2013 0410   CALCIUM 8.0* 05/26/2013 0410   GFRNONAA 13* 05/26/2013  0410   GFRAA 15* 05/26/2013 0410   Estimated Creatinine Clearance: 19.9 ml/min (by C-G formula based on Cr of 4.02).  COAG Lab Results  Component Value Date   INR 2.43* 05/15/2013   INR 2.71* 05/13/2013   INR 1.72* 05/13/2013  No results found for this basename: PTT     ASSESSMENT/PLAN: [name] is a 79 y.o. male is S/P CABG. Pt developed acute on CKD now requiring HD. Wife is at bedside. Pt has been confused and is sedated with Haldol. Procedure has been explained to his wife who shows understanding and will sign consent. We will place [facility] tomorrow.  Pt on Heparin drip for A.Fib - will discuss with Dr. Darrick Penna when he wants to stop heparin for procedure   ROCZNIAK,REGINA J 05/26/2013 10:34 AM    History and exam details as above.  Diatek catheter today.  Stop heparin on call restart 2 hours post procedure.  Fabienne Bruns, MD [facility] [place] Office: [phone] Pager: [phone]

## 2013-05-27 ENCOUNTER — Encounter (HOSPITAL_COMMUNITY): Payer: Self-pay | Admitting: Vascular Surgery

## 2013-05-27 LAB — CBC
MCH: 25.8 pg — ABNORMAL LOW (ref 26.0–34.0)
MCV: 79 fL (ref 78.0–100.0)
Platelets: 70 10*3/uL — ABNORMAL LOW (ref 150–400)
RDW: 20.4 % — ABNORMAL HIGH (ref 11.5–15.5)
WBC: 14.5 10*3/uL — ABNORMAL HIGH (ref 4.0–10.5)

## 2013-05-27 LAB — RENAL FUNCTION PANEL
Albumin: 2 g/dL — ABNORMAL LOW (ref 3.5–5.2)
Calcium: 8 mg/dL — ABNORMAL LOW (ref 8.4–10.5)
Creatinine, Ser: 3.64 mg/dL — ABNORMAL HIGH (ref 0.50–1.35)
GFR calc non Af Amer: 15 mL/min — ABNORMAL LOW (ref >90)
Phosphorus: 4.2 mg/dL (ref 2.3–4.6)

## 2013-05-27 MED ORDER — LEVOTHYROXINE SODIUM 50 MCG PO TABS
50.0000 ug | ORAL_TABLET | Freq: Every day | ORAL | Status: DC
  Administered 2013-05-27 – 2013-05-28 (×2): 50 ug via ORAL
  Filled 2013-05-27 (×5): qty 1

## 2013-05-27 MED ORDER — PANTOPRAZOLE SODIUM 40 MG PO TBEC
40.0000 mg | DELAYED_RELEASE_TABLET | Freq: Every day | ORAL | Status: DC
  Administered 2013-05-27 – 2013-06-03 (×8): 40 mg via ORAL
  Filled 2013-05-27 (×9): qty 1

## 2013-05-27 MED ORDER — ALBUTEROL SULFATE (5 MG/ML) 0.5% IN NEBU: 2.5000 mg | INHALATION_SOLUTION | Freq: Four times a day (QID) | RESPIRATORY_TRACT | Status: DC | PRN

## 2013-05-27 MED ORDER — WARFARIN SODIUM 2.5 MG PO TABS
2.5000 mg | ORAL_TABLET | Freq: Every day | ORAL | Status: DC
  Administered 2013-05-27 – 2013-05-30 (×4): 2.5 mg via ORAL
  Filled 2013-05-27 (×6): qty 1

## 2013-05-27 MED ORDER — HYDROCODONE-ACETAMINOPHEN 5-325 MG PO TABS
1.0000 | ORAL_TABLET | ORAL | Status: DC | PRN
  Administered 2013-05-27 – 2013-05-28 (×6): 2 via ORAL
  Administered 2013-05-29 (×2): 1 via ORAL
  Administered 2013-05-29 – 2013-06-03 (×7): 2 via ORAL
  Filled 2013-05-27 (×11): qty 2
  Filled 2013-05-27: qty 1
  Filled 2013-05-27 (×2): qty 2
  Filled 2013-05-27: qty 1

## 2013-05-27 MED ORDER — DEXTROSE 5 % IV SOLN
2.0000 g | INTRAVENOUS | Status: DC
  Administered 2013-05-28 – 2013-05-30 (×2): 2 g via INTRAVENOUS
  Filled 2013-05-27 (×2): qty 2

## 2013-05-27 MED ORDER — AMIODARONE HCL 200 MG PO TABS
400.0000 mg | ORAL_TABLET | Freq: Two times a day (BID) | ORAL | Status: DC
  Administered 2013-05-27 – 2013-05-28 (×4): 400 mg via ORAL
  Filled 2013-05-27 (×6): qty 2

## 2013-05-27 MED ORDER — WARFARIN - PHYSICIAN DOSING INPATIENT
Freq: Every day | Status: DC
  Administered 2013-05-29 – 2013-05-31 (×2)

## 2013-05-27 NOTE — Progress Notes (Signed)
Speech Language Pathology Dysphagia Treatment Patient Details Name: [name] MRN: [id number] DOB: 07-31-1934 Today's Date: 05/27/2013 Time: 4098-1191 SLP Time Calculation (min): 8 min  Assessment / Plan / Recommendation Clinical Impression  Skilled dysphagia treatment for possible upgrade of liquids/diet texture.  Pt. cooperative today and in good spirits.  He consumed thin water via straw and cup with immediate cough after straw with probable penetration.  Prolonged mastication with regular texture.  Recommend he upgrade to thin liquids via cup, no straws, continue Dys 3 texture with continued ST safety for and texture upgrade.    Diet Recommendation  Initiate / Change Diet: Dysphagia 3 (mechanical soft);Thin liquid    SLP Plan Continue with current plan of care   Pertinent Vitals/Pain none   Swallowing Goals  SLP Swallowing Goals Patient will utilize recommended strategies during swallow to increase swallowing safety with: Minimal cueing Swallow Study Goal #2 - Progress: Progressing toward goal  General Temperature Spikes Noted: No Respiratory Status: Supplemental O2 delivered via (comment) Behavior/Cognition: Alert;Cooperative;Pleasant mood Oral Cavity - Dentition: Edentulous Patient Positioning: Upright in chair  Oral Cavity - Oral Hygiene Does patient have any of the following "at risk" factors?: Oxygen therapy - cannula, mask, simple oxygen devices Brush patient's teeth BID with toothbrush (using toothpaste with fluoride): Yes Patient is AT RISK - Oral Care Protocol followed (see row info): Yes   Dysphagia Treatment Treatment focused on: Skilled observation of diet tolerance;Upgraded PO texture trials Treatment Methods/Modalities: Skilled observation;Differential diagnosis Patient observed directly with PO's: Yes Type of PO's observed: Regular;Thin liquids Feeding: Able to feed self Liquids provided via: Cup;Straw Oral Phase Signs & Symptoms: Prolonged bolus  formation;Prolonged oral phase Pharyngeal Phase Signs & Symptoms: Immediate cough Type of cueing: Verbal Amount of cueing: Moderate   GO     [name].Ed [employer] [phone]  05/27/2013

## 2013-05-27 NOTE — Progress Notes (Signed)
S:  Did well with HD.  Able to get 3 liters off.  Sitting in chair  Says he ate breakfast O:BP 108/45  Pulse 61  Temp(Src) 98.2 F (36.8 C) (Oral)  Resp 19  Ht 6\' 1"  (1.854 m)  Wt 106.1 kg (233 lb 14.5 oz)  BMI 30.87 kg/m2  SpO2 98%  Intake/Output Summary (Last 24 hours) at 05/27/13 0817 Last data filed at 05/27/13 0700  Gross per 24 hour  Intake  999.8 ml  Output   3000 ml  Net -2000.2 ml   Weight change: -1.3 kg (-2 lb 13.9 oz) Gen: Awake and alert CVS: RRR Resp: clear Abd:+ BS NTND Ext:2+ edema Rt 3+ Lt NEURO: CNI no asterixis RT IJ perm cath   . ceFEPime (MAXIPIME) IV  2 g Intravenous Q M,W,F-HD  . darbepoetin (ARANESP) injection - NON-DIALYSIS  60 mcg Subcutaneous Q Tue-1800  . dextromethorphan-guaiFENesin  1 tablet Oral BID  . feeding supplement  1 Container Oral TID BM  . levothyroxine  25 mcg Intravenous Daily  . multivitamin  1 tablet Oral Daily  . pantoprazole (PROTONIX) IV  40 mg Intravenous Q24H  . sodium chloride  10-40 mL Intracatheter Q12H   Dg Chest 2 View  05/26/2013   *RADIOLOGY REPORT*  Clinical Data: Post CABG with some shortness of breath  CHEST - 2 VIEW  Comparison: 05/24/2013  Findings: A right subclavian CVP and left internal jugular dual lumen catheter remain unchanged in appearance.  The patient is status post median sternotomy.  Persistent small left pleural effusion and left lower lobe volume loss is seen.  The lung fields otherwise demonstrate interval improvement in basilar aeration and resolved vascular congestion and mild interstitial edema.  No new focal infiltrates or signs of congestive failure are evident.  No pneumothorax is seen.  Bony structures remain stable  IMPRESSION: Improving basilar aeration and interstitial edema with persistent small left pleural effusion and left lower lobe basilar atelectasis   Original Report Authenticated By: Rhodia Albright, M.D.   Dg Chest Port 1 View  05/26/2013   *RADIOLOGY REPORT*  Clinical Data: Status  post dialysis catheter placement.  PORTABLE CHEST - 1 VIEW  Comparison: Chest x-ray 05/26/2013.  Findings: New right-sided internal jugular PermCath with tips terminating at the superior cavoatrial junction and at the superior aspect of the right atrium.  Previously noted left IJ vas cath and right subclavian central venous catheter are unchanged in position. Lung volumes are low.  No definite pneumothorax.  Interstitial and airspace opacities throughout the left base concerning for atelectasis and/or consolidation, likely with superimposed small to moderate left-sided pleural effusion which appears increased. Cephalization of the pulmonary vasculature with slight indistinctness of interstitial markings which may suggest mild interstitial pulmonary edema.  Mild cardiomegaly is unchanged. The patient is rotated to the left on today's exam, resulting in distortion of the mediastinal contours and reduced diagnostic sensitivity and specificity for mediastinal pathology. Atherosclerosis in the thoracic aorta.  Status post median sternotomy for CABG.  IMPRESSION: 1.  Support apparatus, as above. 2.  No pneumothorax following hemodialysis catheter placement. 3.  Cardiomegaly with mild interstitial pulmonary edema. 4.  Increasing atelectasis and/or consolidation in the left lower lobe with increasing small to moderate left-sided pleural effusion. 5.  Atherosclerosis.   Original Report Authenticated By: Trudie Reed, M.D.   Dg Fluoro Guide Cv Line-no Report  05/26/2013   CLINICAL DATA: catheter   FLOURO GUIDE CV LINE  Fluoroscopy was utilized by the requesting physician.  No radiographic  interpretation.    BMET    Component Value Date/Time   NA 139 05/27/2013 0300   K 4.3 05/27/2013 0300   CL 101 05/27/2013 0300   CO2 28 05/27/2013 0300   GLUCOSE 105* 05/27/2013 0300   BUN 34* 05/27/2013 0300   CREATININE 3.64* 05/27/2013 0300   CALCIUM 8.0* 05/27/2013 0300   GFRNONAA 15* 05/27/2013 0300   GFRAA 17* 05/27/2013  0300   CBC    Component Value Date/Time   WBC 14.5* 05/27/2013 0300   RBC 3.10* 05/27/2013 0300   HGB 8.0* 05/27/2013 0300   HCT 24.5* 05/27/2013 0300   PLT 70* 05/27/2013 0300   MCV 79.0 05/27/2013 0300   MCH 25.8* 05/27/2013 0300   MCHC 32.7 05/27/2013 0300   RDW 20.4* 05/27/2013 0300   LYMPHSABS 2.0 03/03/2011 0417   MONOABS 0.6 03/03/2011 0417   EOSABS 0.4 03/03/2011 0417   BASOSABS 0.0 03/03/2011 0417     Assessment: 1. Acute on CKD, no UO 2. SP CABG 3. Anemia on aranesp  Plan: 1.  No HD today.  Will plan tomorrow.  This would be a good day to work on physical therapy 2.  ? Dc IV amiodarone and switch to PO  Bill Yohn T

## 2013-05-27 NOTE — Progress Notes (Signed)
PM ROUNDS  Still c/o hip pain but hydrocodone seems to be helping  BP 112/41  Pulse 59  Temp(Src) 98 F (36.7 C) (Oral)  Resp 16  Ht 6\' 1"  (1.854 m)  Wt 233 lb 14.5 oz (106.1 kg)  BMI 30.87 kg/m2  SpO2 98%   Intake/Output Summary (Last 24 hours) at 05/27/13 1718 Last data filed at 05/27/13 1500  Gross per 24 hour  Intake 1478.6 ml  Output   3000 ml  Net -1521.4 ml   Continue present care

## 2013-05-27 NOTE — Progress Notes (Signed)
Reported off to oncoming shift RN. Safety maintained. No acute distress noted.  

## 2013-05-27 NOTE — Anesthesia Postprocedure Evaluation (Signed)
Anesthesia Post Note  Patient: [name]  Procedure(s) Performed: Procedure(s) (LRB): INSERTION OF DIALYSIS CATHETER (N/A)  Anesthesia type: General  Patient location: PACU  Post pain: Pain level controlled  Post assessment: Patient's Cardiovascular Status Stable  Last Vitals:  Filed Vitals:   05/27/13 0600  BP: 106/55  Pulse: 62  Temp:   Resp: 14    Post vital signs: Reviewed and stable  Level of consciousness: alert  Complications: No apparent anesthesia complications

## 2013-05-27 NOTE — Progress Notes (Signed)
Physical Therapy to ambulate patient in hallways today per order.  Dr. Maren Beach made aware of complaints of right hip pain.  No acute distress noted at this time.  GCS = 14 VSS.

## 2013-05-27 NOTE — Progress Notes (Addendum)
[facility] DAILY ICU PROGRESS NOTE                   [address]            [name] [address]          [phone]   1 Day Post-Op Procedure(s) (LRB): INSERTION OF DIALYSIS CATHETER (N/A)  Total Length of Stay:  LOS: 15 days   Subjective: Feels better , less pain except now having some in right hip. Much more oriented and aware of surroundings/recent events.  Objective: Vital signs in last 24 hours: Temp:  [97.3 F (36.3 C)-98.7 F (37.1 C)] 98.2 F (36.8 C) (07/23 0728) Pulse Rate:  [56-88] 61 (07/23 0700) Cardiac Rhythm:  [-] Normal sinus rhythm (07/23 0600) Resp:  [10-21] 20 (07/23 0700) BP: (95-131)/(35-55) 115/49 mmHg (07/23 0700) SpO2:  [91 %-100 %] 98 % (07/23 0700) Weight:  [233 lb 14.5 oz (106.1 kg)-242 lb 11.6 oz (110.1 kg)] 233 lb 14.5 oz (106.1 kg) (07/23 0530)  Filed Weights   05/26/13 1620 05/26/13 2044 05/27/13 0530  Weight: 242 lb 11.6 oz (110.1 kg) 237 lb 7 oz (107.7 kg) 233 lb 14.5 oz (106.1 kg)    Weight change: -2 lb 13.9 oz (-1.3 kg)   Hemodynamic parameters for last 24 hours:    Intake/Output from previous day: 07/22 0701 - 07/23 0700 In: 1009.8 [P.O.:300; I.V.:659.8; IV Piggyback:50] Out: 3000   Intake/Output this shift:    Current Meds: Scheduled Meds: . ipratropium  0.5 mg Nebulization Q6H   And  . albuterol  2.5 mg Nebulization Q6H  . ceFEPime (MAXIPIME) IV  2 g Intravenous Q M,W,F-HD  . darbepoetin (ARANESP) injection - NON-DIALYSIS  60 mcg Subcutaneous Q Tue-1800  . dextromethorphan-guaiFENesin  1 tablet Oral BID  . feeding supplement  1 Container Oral TID BM  . levothyroxine  25 mcg Intravenous Daily  . multivitamin  1 tablet Oral Daily  . pantoprazole (PROTONIX) IV  40 mg Intravenous Q24H  . sodium chloride  10-40 mL Intracatheter Q12H   Continuous Infusions: . sodium chloride    . amiodarone (NEXTERONE PREMIX) 360 mg/200 mL dextrose 30 mg/hr (05/27/13 0700)   PRN Meds:.acetaminophen, fentaNYL, food thickener,  haloperidol lactate, ondansetron (ZOFRAN) IV, sodium chloride  General appearance: alert, cooperative and no distress Heart: regular rate and rhythm Lungs: clear to auscultation bilaterally Abdomen: soft, nontender Extremities: edema somewhat improved Wound: incisions healing well  Lab Results: CBC: Recent Labs  05/26/13 1125 05/27/13 0300  WBC 16.5* 14.5*  HGB 8.2* 8.0*  HCT 25.0* 24.5*  PLT 93* 70*   BMET:  Recent Labs  05/26/13 0410 05/27/13 0300  NA 138 139  K 4.0 4.3  CL 99 101  CO2 25 28  GLUCOSE 102* 105*  BUN 43* 34*  CREATININE 4.02* 3.64*  CALCIUM 8.0* 8.0*    PT/INR:  Recent Labs  05/27/13 0300  LABPROT 17.2*  INR 1.44   Radiology: Dg Chest 2 View  05/26/2013   *RADIOLOGY REPORT*  Clinical Data: Post CABG with some shortness of breath  CHEST - 2 VIEW  Comparison: 05/24/2013  Findings: A right subclavian CVP and left internal jugular dual lumen catheter remain unchanged in appearance.  The patient is status post median sternotomy.  Persistent small left pleural effusion and left lower lobe volume loss is seen.  The lung fields otherwise demonstrate interval improvement in basilar aeration and resolved vascular congestion and mild interstitial edema.  No new focal infiltrates or  signs of congestive failure are evident.  No pneumothorax is seen.  Bony structures remain stable  IMPRESSION: Improving basilar aeration and interstitial edema with persistent small left pleural effusion and left lower lobe basilar atelectasis   Original Report Authenticated By: Rhodia Albright, M.D.   Dg Chest Port 1 View  05/26/2013   *RADIOLOGY REPORT*  Clinical Data: Status post dialysis catheter placement.  PORTABLE CHEST - 1 VIEW  Comparison: Chest x-ray 05/26/2013.  Findings: New right-sided internal jugular PermCath with tips terminating at the superior cavoatrial junction and at the superior aspect of the right atrium.  Previously noted left IJ vas cath and right subclavian  central venous catheter are unchanged in position. Lung volumes are low.  No definite pneumothorax.  Interstitial and airspace opacities throughout the left base concerning for atelectasis and/or consolidation, likely with superimposed small to moderate left-sided pleural effusion which appears increased. Cephalization of the pulmonary vasculature with slight indistinctness of interstitial markings which may suggest mild interstitial pulmonary edema.  Mild cardiomegaly is unchanged. The patient is rotated to the left on today's exam, resulting in distortion of the mediastinal contours and reduced diagnostic sensitivity and specificity for mediastinal pathology. Atherosclerosis in the thoracic aorta.  Status post median sternotomy for CABG.  IMPRESSION: 1.  Support apparatus, as above. 2.  No pneumothorax following hemodialysis catheter placement. 3.  Cardiomegaly with mild interstitial pulmonary edema. 4.  Increasing atelectasis and/or consolidation in the left lower lobe with increasing small to moderate left-sided pleural effusion. 5.  Atherosclerosis.   Original Report Authenticated By: Trudie Reed, M.D.   Dg Fluoro Guide Cv Line-no Report  05/26/2013   CLINICAL DATA: catheter   FLOURO GUIDE CV LINE  Fluoroscopy was utilized by the requesting physician.  No radiographic  interpretation.      Assessment/Plan: S/P Procedure(s) (LRB): INSERTION OF DIALYSIS CATHETER (N/A)  1 doing better overall 2 push rehab as able, PT seeing  3 Anuric- creat improved. Nephrology managing. Diatek has been placed 4 poss start coumadin, per Dr Dorris Fetch risks may outway benefits. Had a few PAC's yesterday, otherwise maintaining Sinus rhythm 5 maxepime for + enterobacter in sputum 6 sugars controlled 7 anemia fairly stable GOLD,WAYNE E 05/27/2013 7:33 AM  Patient seen and examined. Agree with above  His mental status has improved and he has 2 indications for long term anticoagulation- CVT and a fib, will  restart coumadin. Home dose was 5 mg daily- will restart at 2.5 mg for a couple of days and see how INR responds  Thrombocytopenia- PLT count down a little today- no clinical signs of thrombosis or bleeding

## 2013-05-27 NOTE — Procedures (Signed)
Pt seen on HD yest but put note on wrong pt.  Had new permcath placed.  Now off heparin   SBP 111.  HR 57

## 2013-05-27 NOTE — Progress Notes (Signed)
Physical Therapy Treatment Patient Details Name: [name] MRN: [id number] DOB: 1934/06/09 Today's Date: 05/27/2013 Time: 8295-6213 PT Time Calculation (min): 24 min  PT Assessment / Plan / Recommendation  History of Present Illness Pt transferred from [facility] with NSTEMI s/p CABG with elevated creatinine and lethargy, CVVHD x 3 days   Clinical Impression Pt demonstrates some modest progress towards PT goals today. Patient able to transfer with less physical assist but continues to remain limited in ambulation at this time secondary to Hip pain, fatigue, and coughing spell. Educated patient on positioning to increase neck extension and performed some there-ex with patient to work on upright posture and cervical positioning. Will continue to see as indicated and progress as tolerated.       Follow Up Recommendations  SNF;Supervision/Assistance - 24 hour     Does the patient have the potential to tolerate intense rehabilitation     Barriers to Discharge        Equipment Recommendations  None recommended by PT    Recommendations for Other Services OT consult  Frequency Min 3X/week   Progress towards PT Goals    Plan Current plan remains appropriate    Precautions / Restrictions Precautions Precautions: Sternal;Fall Restrictions Weight Bearing Restrictions:  (sternal precautions)   Pertinent Vitals/Pain 6/10    Mobility  Bed Mobility Bed Mobility: Rolling Left;Left Sidelying to Sit Rolling Left: 3: Mod assist Left Sidelying to Sit: 3: Mod assist;HOB elevated (20 degrees) Sitting - Scoot to Edge of Bed: 4: Min guard Details for Bed Mobility Assistance: VCs for sternal precautions and positioning Transfers Transfers: Sit to Stand;Stand to Sit Sit to Stand: 3: Mod assist;From bed;From elevated surface Stand to Sit: 4: Min assist;To chair/3-in-1 Details for Transfer Assistance: VCs for hand placement and sternal precautions Ambulation/Gait Ambulation/Gait Assistance:  4: Min assist Ambulation Distance (Feet): 20 Feet Assistive device: Rolling walker Ambulation/Gait Assistance Details: limited by coughing spell and pain in right hip Gait Pattern: Trunk flexed;Shuffle Gait velocity: decreased Stairs: No    Exercises General Exercises - Lower Extremity Ankle Circles/Pumps: AROM;Both;10 reps Other Exercises Other Exercises: cervical extension exercises (counts ceiling blocks from door to computer slowly extending neck) Other Exercises: Neck extension manual PROM stretches 3x 5 seconds   PT Diagnosis: Difficulty walking;Altered mental status  PT Problem List: Decreased activity tolerance;Decreased mobility;Decreased cognition;Decreased knowledge of use of DME;Decreased knowledge of precautions;Decreased safety awareness PT Treatment Interventions: DME instruction;Gait training;Stair training;Therapeutic exercise;Therapeutic activities;Functional mobility training;Cognitive remediation;Patient/family education   PT Goals (current goals can now be found in the care plan section) Acute Rehab PT Goals Patient Stated Goal: return to garden PT Goal Formulation: With patient/family Time For Goal Achievement: 06/08/13 Potential to Achieve Goals: Fair  Visit Information  Last PT Received On: 05/27/13 Assistance Needed: +2 (safety for gait) History of Present Illness: Pt transferred from [facility] with NSTEMI s/p CABG with elevated creatinine and lethargy, CVVHD x 3 days    Subjective Data  Patient Stated Goal: return to garden   Cognition  Cognition Arousal/Alertness: Awake/alert Behavior During Therapy: Flat affect Overall Cognitive Status: Impaired/Different from baseline Area of Impairment: Orientation Orientation Level: Disoriented to;Time Current Attention Level: Selective Memory: Decreased recall of precautions;Decreased short-term memory General Comments: improved alertness from eval but still difficulty with orientation and maintaining arousal     Balance  Balance Balance Assessed: Yes Static Sitting Balance Static Sitting - Balance Support: Feet supported;Bilateral upper extremity supported Static Sitting - Level of Assistance: 5: Stand by assistance  End of Session PT -  End of Session Equipment Utilized During Treatment: Gait belt;Oxygen Activity Tolerance: Patient limited by fatigue (and coughing spell) Patient left: in chair;with call bell/phone within reach Nurse Communication: Mobility status   GP     Fabio Asa 05/27/2013, 2:48 PM Charlotte Crumb, PT DPT  [phone]

## 2013-05-28 DIAGNOSIS — I369 Nonrheumatic tricuspid valve disorder, unspecified: Secondary | ICD-10-CM

## 2013-05-28 DIAGNOSIS — R5381 Other malaise: Secondary | ICD-10-CM

## 2013-05-28 LAB — RENAL FUNCTION PANEL
CO2: 25 mEq/L (ref 19–32)
Calcium: 8 mg/dL — ABNORMAL LOW (ref 8.4–10.5)
Chloride: 95 mEq/L — ABNORMAL LOW (ref 96–112)
GFR calc Af Amer: 10 mL/min — ABNORMAL LOW (ref >90)
GFR calc non Af Amer: 9 mL/min — ABNORMAL LOW (ref >90)
Glucose, Bld: 94 mg/dL (ref 70–99)
Potassium: 4.7 mEq/L (ref 3.5–5.1)
Sodium: 132 mEq/L — ABNORMAL LOW (ref 135–145)

## 2013-05-28 LAB — CBC
Hemoglobin: 8 g/dL — ABNORMAL LOW (ref 13.0–17.0)
MCH: 25.6 pg — ABNORMAL LOW (ref 26.0–34.0)
Platelets: 92 10*3/uL — ABNORMAL LOW (ref 150–400)
RBC: 3.12 MIL/uL — ABNORMAL LOW (ref 4.22–5.81)
WBC: 17.3 10*3/uL — ABNORMAL HIGH (ref 4.0–10.5)

## 2013-05-28 LAB — TSH: TSH: 18.658 u[IU]/mL — ABNORMAL HIGH (ref 0.350–4.500)

## 2013-05-28 LAB — PROTIME-INR: Prothrombin Time: 17.6 seconds — ABNORMAL HIGH (ref 11.6–15.2)

## 2013-05-28 MED ORDER — CARVEDILOL 3.125 MG PO TABS
3.1250 mg | ORAL_TABLET | Freq: Two times a day (BID) | ORAL | Status: DC
Start: 2013-05-28 — End: 2013-05-29
  Administered 2013-05-28: 3.125 mg via ORAL
  Filled 2013-05-28 (×4): qty 1

## 2013-05-28 NOTE — Progress Notes (Signed)
  Echocardiogram 2D Echocardiogram has been performed.  Georgian Co 05/28/2013, 6:01 PM

## 2013-05-28 NOTE — Progress Notes (Signed)
[facility] BRIEF SICU PROGRESS NOTE  Stable day Maintaining NSR w/ stable BP   Plan: Continue current plan  [name] H 05/28/2013 8:03 PM

## 2013-05-28 NOTE — Procedures (Signed)
Pt seen on HD.  Ap 190  Vp 150  SBP 108.  Only OX2.  Tolerating HD well

## 2013-05-28 NOTE — Consult Note (Signed)
Physical Medicine and Rehabilitation Consult Reason for Consult: Metabolic encephalopathy, CABG for NSTEMI Referring Physician: Dr. Morton Peters   HPI: [name] is a 79 y.o. male with PMhx significant for HTN, RA, hypothyroidism along with diffuse vascular dz to include CAD, PAD, AAA and common iliac artery dissection. He also has hx of atrial flutter and CKD --Cr 1.77 when admitted on 7/9 with CP and recurrent a flutter with positive troponin.  His hospital course progressed quickly with him needing cardiac cath on 7/9 and eventually semi emergent CABG that same day. Post op with hypotension and declining renal function with decrease in UOP.  Noted to be volume overloaded as well as somnolence on 07/14. Renal consulted and patient failed diuretic challenge requiring dialysis support. Started on IV heparin for persistent A Fib.ST evaluation done 07/18 due to concerns of dysphagia and FEES done mild oropharyngeal stage dysphagia--D3, nectar liquids recommended due to intermittent lethargy and deconditioning--advanced to thins. HD ongoing but limited due to low BP. IJ placed on 07/22 as he due to lack of UOP.Patient continues to have bouts of confusion with agitation with impaired insight/awareness.   Patient complains of tailbone pain. Has been up in chair Review of Systems  HENT: Negative for hearing loss.   Eyes: Negative for blurred vision and double vision.  Respiratory: Negative for shortness of breath.   Cardiovascular: Negative for chest pain and palpitations.  Gastrointestinal: Positive for heartburn, abdominal pain and constipation.  Musculoskeletal: Positive for joint pain (right hip due to OA. ).  Neurological: Positive for weakness. Negative for headaches.   Past Medical History  Diagnosis Date  . Cancer   . Hypertension   . Collagen vascular disease   . Anginal pain   . Depression   . Arthritis    Past Surgical History  Procedure Laterality Date  . Eye surgery    .  Coronary artery bypass graft N/A 05/13/2013    Procedure: CORONARY ARTERY BYPASS GRAFTING (CABG);  Surgeon: Alleen Borne, MD;  [facility];  Service: Open Heart Surgery;  Laterality: N/A;  . Insertion of dialysis catheter N/A 05/26/2013    Procedure: INSERTION OF DIALYSIS CATHETER;  Surgeon: Sherren Kerns, MD;  Location: [facility] OR;  Service: Vascular;  Laterality: N/A;  right IJ   History reviewed. No pertinent family history.  Social History:  Married--wife in good health.  Independent without AD. Used to work for [employer] He reports that he smoked years ago. He does not have any smokeless tobacco history on file. He reports that he does not drink alcohol or use illicit drugs.  Allergies: No Known Allergies  Medications Prior to Admission  Medication Sig Dispense Refill  . aspirin EC 81 MG tablet Take 81 mg by mouth daily.      [name] diltiazem (TIAZAC) 360 MG 24 hr capsule Take 360 mg by mouth daily.      . simvastatin (ZOCOR) 40 MG tablet Take 40 mg by mouth every evening.      . warfarin (COUMADIN) 5 MG tablet Take 5 mg by mouth daily.        Home: Home Living Family/patient expects to be discharged to:: Private residence Living Arrangements: Spouse/significant other;Children (sons can help at DC 24hrs) Available Help at Discharge: Family;Available 24 hours/day Type of Home: House Home Access: Stairs to enter [employer] of Steps: 3 Home Layout: One level Home Equipment: Walker - 2 wheels;Bedside commode;Wheelchair - manual  Functional History:   Functional Status:  Mobility: Bed Mobility Bed Mobility: Rolling Left;Left Sidelying to Sit Rolling Left: 3: Mod assist Left Sidelying to Sit: 3: Mod assist;HOB elevated (20 degrees) Sitting - Scoot to Edge of Bed: 4: Min guard Sit to Supine: 1: +2 Total assist;HOB flat Sit to Supine: Patient Percentage: 60% Transfers Transfers: Sit to Stand;Stand to Sit Sit to Stand: 3: Mod assist;From bed;From  elevated surface Sit to Stand: Patient Percentage: 70% Stand to Sit: 4: Min assist;To chair/3-in-1 Ambulation/Gait Ambulation/Gait Assistance: 4: Min assist Ambulation Distance (Feet): 20 Feet Assistive device: Rolling walker Ambulation/Gait Assistance Details: limited by coughing spell and pain in right hip Gait Pattern: Trunk flexed;Shuffle Gait velocity: decreased Stairs: No    ADL:    Cognition: Cognition Overall Cognitive Status: Impaired/Different from baseline Orientation Level: Oriented to person;Oriented to place;Oriented to situation Cognition Arousal/Alertness: Awake/alert Behavior During Therapy: Flat affect Overall Cognitive Status: Impaired/Different from baseline Area of Impairment: Orientation Orientation Level: Disoriented to;Time Current Attention Level: Selective Memory: Decreased recall of precautions;Decreased short-term memory General Comments: improved alertness from eval but still difficulty with orientation and maintaining arousal  Blood pressure 97/54, pulse 116, temperature 98.2 F (36.8 C), temperature source Oral, resp. rate 12, height 6\' 1"  (1.854 m), weight 106.3 kg (234 lb 5.6 oz), SpO2 97.00%.   Physical Exam  Nursing note and vitals reviewed. Constitutional: He appears well-developed and well-nourished.  HENT:  Head: Normocephalic and atraumatic.  Eyes: Conjunctivae are normal. Pupils are equal, round, and reactive to light.  Neck: Normal range of motion. Neck supple.  Cardiovascular: Normal rate and regular rhythm.   Pulmonary/Chest: Effort normal and breath sounds normal. No respiratory distress. He has no wheezes.  Abdominal: Soft. Bowel sounds are normal. He exhibits no distension. There is tenderness.  Musculoskeletal: He exhibits edema (BLE with 1+ edema.).  Neurological: He is alert.  Oriented to self and place "hospital" but needed max cues for name.  Decreased awareness of current issue with impaired insight. Decreased memory.  Able to follow simple commands without difficulty.   Skin: Skin is warm and dry.   motor strength is 4/5 in left hip flexor knee extensor ankle dorsiflexor and plantar flexor 3 minus right hip flexor, knee extensors, ankle dorsiflexor plantar flexor 5/5 bilateral deltoid, bicep, tricep, grip Decreased sensation right foot compared to left foot.  Results for orders placed during the hospital encounter of 05/12/13 (from the past 24 hour(s))  CBC     Status: Abnormal   Collection Time    05/28/13  2:30 AM      Result Value Range   WBC 17.3 (*) 4.0 - 10.5 K/uL   RBC 3.12 (*) 4.22 - 5.81 MIL/uL   Hemoglobin 8.0 (*) 13.0 - 17.0 g/dL   HCT 16.1 (*) 09.6 - 04.5 %   MCV 79.5  78.0 - 100.0 fL   MCH 25.6 (*) 26.0 - 34.0 pg   MCHC 32.3  30.0 - 36.0 g/dL   RDW 40.9 (*) 81.1 - 91.4 %   Platelets 92 (*) 150 - 400 K/uL  RENAL FUNCTION PANEL     Status: Abnormal   Collection Time    05/28/13  2:30 AM      Result Value Range   Sodium 132 (*) 135 - 145 mEq/L   Potassium 4.7  3.5 - 5.1 mEq/L   Chloride 95 (*) 96 - 112 mEq/L   CO2 25  19 - 32 mEq/L   Glucose, Bld 94  70 - 99 mg/dL   BUN 52 (*) 6 -  23 mg/dL   Creatinine, Ser 1.61 (*) 0.50 - 1.35 mg/dL   Calcium 8.0 (*) 8.4 - 10.5 mg/dL   Phosphorus 6.9 (*) 2.3 - 4.6 mg/dL   Albumin 2.1 (*) 3.5 - 5.2 g/dL   GFR calc non Af Amer 9 (*) >90 mL/min   GFR calc Af Amer 10 (*) >90 mL/min  PROTIME-INR     Status: Abnormal   Collection Time    05/28/13  2:30 AM      Result Value Range   Prothrombin Time 17.6 (*) 11.6 - 15.2 seconds   INR 1.49  0.00 - 1.49   Dg Chest Port 1 View  05/26/2013   *RADIOLOGY REPORT*  Clinical Data: Status post dialysis catheter placement.  PORTABLE CHEST - 1 VIEW  Comparison: Chest x-ray 05/26/2013.  Findings: New right-sided internal jugular PermCath with tips terminating at the superior cavoatrial junction and at the superior aspect of the right atrium.  Previously noted left IJ vas cath and right subclavian central venous  catheter are unchanged in position. Lung volumes are low.  No definite pneumothorax.  Interstitial and airspace opacities throughout the left base concerning for atelectasis and/or consolidation, likely with superimposed small to moderate left-sided pleural effusion which appears increased. Cephalization of the pulmonary vasculature with slight indistinctness of interstitial markings which may suggest mild interstitial pulmonary edema.  Mild cardiomegaly is unchanged. The patient is rotated to the left on today's exam, resulting in distortion of the mediastinal contours and reduced diagnostic sensitivity and specificity for mediastinal pathology. Atherosclerosis in the thoracic aorta.  Status post median sternotomy for CABG.  IMPRESSION: 1.  Support apparatus, as above. 2.  No pneumothorax following hemodialysis catheter placement. 3.  Cardiomegaly with mild interstitial pulmonary edema. 4.  Increasing atelectasis and/or consolidation in the left lower lobe with increasing small to moderate left-sided pleural effusion. 5.  Atherosclerosis.   Original Report Authenticated By: Trudie Reed, M.D.   Dg Fluoro Guide Cv Line-no Report  05/26/2013   CLINICAL DATA: catheter   FLOURO GUIDE CV LINE  Fluoroscopy was utilized by the requesting physician.  No radiographic  interpretation.     Assessment/Plan: Diagnosis: Deconditioning after myocardial infarction and emergent coronary artery bypass grafting 1. Does the need for close, 24 hr/day medical supervision in concert with the patient's rehab needs make it unreasonable for this patient to be served in a less intensive setting? Yes 2. Co-Morbidities requiring supervision/potential complications: Coronary artery disease, postop respiratory failure, acute renal failure requiring hemodialysis 3. Due to bowel management, safety, skin/wound care, disease management, medication administration, pain management and patient education, does the patient require 24 hr/day  rehab nursing? Yes 4. Does the patient require coordinated care of a physician, rehab nurse, PT (1-2 hrs/day, 5 days/week), OT (1-2 hrs/day, 5 days/week) and SLP (45-1 hrs/day, 5 days/week) to address physical and functional deficits in the context of the above medical diagnosis(es)? Yes Addressing deficits in the following areas: balance, endurance, locomotion, strength, transferring, bowel/bladder control, bathing, dressing, feeding, grooming, toileting and cognition 5. Can the patient actively participate in an intensive therapy program of at least 3 hrs of therapy per day at least 5 days per week? Yes 6. The potential for patient to make measurable gains while on inpatient rehab is good 7. Anticipated functional outcomes upon discharge from inpatient rehab are supervision mobility with PT, supervision ADLs with OT, 100% orientation, modified independent medication management with SLP. 8. Estimated rehab length of stay to reach the above functional goals is: 10-12 days  9. Does the patient have adequate social supports to accommodate these discharge functional goals? Yes 10. Anticipated D/C setting: Home 11. Anticipated post D/C treatments: HH therapy 12. Overall Rehab/Functional Prognosis: good  RECOMMENDATIONS: This patient's condition is appropriate for continued rehabilitative care in the following setting: CIR Patient has agreed to participate in recommended program. Yes Note that insurance prior authorization may be required for reimbursement for recommended care.  Comment: Patient has some asymmetric weakness right lower extremity, may need further workup if this does not improve    05/28/2013

## 2013-05-28 NOTE — Progress Notes (Addendum)
T[facility] DAILY ICU PROGRESS NOTE                   [address]            [name] [address]          [phone]   2 Days Post-Op Procedure(s) (LRB): INSERTION OF DIALYSIS CATHETER (N/A)  Total Length of Stay:  LOS: 16 days   Subjective: Says he made a small amt of urine yesterday but I can't verify. Hip less painful  Objective: Vital signs in last 24 hours: Temp:  [97.3 F (36.3 C)-98.6 F (37 C)] 97.7 F (36.5 C) (07/24 0645) Pulse Rate:  [56-72] 60 (07/24 0715) Cardiac Rhythm:  [-] Sinus bradycardia (07/24 0600) Resp:  [10-23] 14 (07/24 0715) BP: (101-131)/(39-58) 117/49 mmHg (07/24 0715) SpO2:  [93 %-99 %] 95 % (07/24 0715) Weight:  [233 lb 11 oz (106 kg)] 233 lb 11 oz (106 kg) (07/24 0645)  Filed Weights   05/27/13 0530 05/28/13 0400 05/28/13 0645  Weight: 233 lb 14.5 oz (106.1 kg) 233 lb 11 oz (106 kg) 233 lb 11 oz (106 kg)    Weight change: -9 lb 0.6 oz (-4.1 kg)   Hemodynamic parameters for last 24 hours:    Intake/Output from previous day: 07/23 0701 - 07/24 0700 In: 1218.8 [P.O.:1140; I.V.:78.8] Out: -   Intake/Output this shift:    Current Meds: Scheduled Meds: . amiodarone  400 mg Oral BID  . ceFEPime (MAXIPIME) IV  2 g Intravenous Q T,Th,Sa-HD  . darbepoetin (ARANESP) injection - NON-DIALYSIS  60 mcg Subcutaneous Q Tue-1800  . dextromethorphan-guaiFENesin  1 tablet Oral BID  . feeding supplement  1 Container Oral TID BM  . levothyroxine  50 mcg Oral QAC breakfast  . multivitamin  1 tablet Oral Daily  . pantoprazole  40 mg Oral Daily  . sodium chloride  10-40 mL Intracatheter Q12H  . warfarin  2.5 mg Oral q1800  . Warfarin - Physician Dosing Inpatient   Does not apply q1800   Continuous Infusions: . sodium chloride     PRN Meds:.acetaminophen, albuterol, fentaNYL, food thickener, haloperidol lactate, HYDROcodone-acetaminophen, ondansetron (ZOFRAN) IV, sodium chloride  General appearance: alert, cooperative and no  distress Heart: irregularly irregular rhythm Lungs: coarse in left base Abdomen: soft, nontender,, mod distension Extremities: + LE edema, somewha improved Wound: incisions healing well  Lab Results: CBC: Recent Labs  05/27/13 0300 05/28/13 0230  WBC 14.5* 17.3*  HGB 8.0* 8.0*  HCT 24.5* 24.8*  PLT 70* 92*   BMET:  Recent Labs  05/27/13 0300 05/28/13 0230  NA 139 132*  K 4.3 4.7  CL 101 95*  CO2 28 25  GLUCOSE 105* 94  BUN 34* 52*  CREATININE 3.64* 5.55*  CALCIUM 8.0* 8.0*    PT/INR:  Recent Labs  05/28/13 0230  LABPROT 17.6*  INR 1.49   Radiology: Dg Chest Port 1 View  05/26/2013   *RADIOLOGY REPORT*  Clinical Data: Status post dialysis catheter placement.  PORTABLE CHEST - 1 VIEW  Comparison: Chest x-ray 05/26/2013.  Findings: New right-sided internal jugular PermCath with tips terminating at the superior cavoatrial junction and at the superior aspect of the right atrium.  Previously noted left IJ vas cath and right subclavian central venous catheter are unchanged in position. Lung volumes are low.  No definite pneumothorax.  Interstitial and airspace opacities throughout the left base concerning for atelectasis and/or consolidation, likely with superimposed small to moderate left-sided pleural effusion which  appears increased. Cephalization of the pulmonary vasculature with slight indistinctness of interstitial markings which may suggest mild interstitial pulmonary edema.  Mild cardiomegaly is unchanged. The patient is rotated to the left on today's exam, resulting in distortion of the mediastinal contours and reduced diagnostic sensitivity and specificity for mediastinal pathology. Atherosclerosis in the thoracic aorta.  Status post median sternotomy for CABG.  IMPRESSION: 1.  Support apparatus, as above. 2.  No pneumothorax following hemodialysis catheter placement. 3.  Cardiomegaly with mild interstitial pulmonary edema. 4.  Increasing atelectasis and/or consolidation in  the left lower lobe with increasing small to moderate left-sided pleural effusion. 5.  Atherosclerosis.   Original Report Authenticated By: Trudie Reed, M.D.   Dg Fluoro Guide Cv Line-no Report  05/26/2013   CLINICAL DATA: catheter   FLOURO GUIDE CV LINE  Fluoroscopy was utilized by the requesting physician.  No radiographic  interpretation.      Assessment/Plan: S/P Procedure(s) (LRB): INSERTION OF DIALYSIS CATHETER (N/A)  1 Currently on dialysis- as per nephrology 2 Having Pac's, cont amio/coumadin 3 Maxepime- started 05/25/13, leukocytosis sightly increased, will recheck CXR in am 4 recheck TSH on amio, was elevated 10.857 on 7/8 5 HCT stable at 24.8, platelets stable. Ranging from 60 k-93k over last couple days 6 push PT as able- getting stronger. Disp SNF vs LTAC for further rehab prior to home   GOLD,WAYNE E 05/28/2013 7:32 AM patient examined and medical record reviewed,agree with above note. Keep in ICU today Start low dose coreg for preop MI- no Ace-I due to ATN VAN TRIGT III,PETER 05/28/2013

## 2013-05-29 ENCOUNTER — Inpatient Hospital Stay (HOSPITAL_COMMUNITY): Payer: Medicare Other

## 2013-05-29 LAB — CBC
HCT: 24.5 % — ABNORMAL LOW (ref 39.0–52.0)
MCHC: 32.7 g/dL (ref 30.0–36.0)
Platelets: 136 10*3/uL — ABNORMAL LOW (ref 150–400)
RDW: 22.7 % — ABNORMAL HIGH (ref 11.5–15.5)

## 2013-05-29 LAB — RENAL FUNCTION PANEL
Albumin: 2 g/dL — ABNORMAL LOW (ref 3.5–5.2)
BUN: 37 mg/dL — ABNORMAL HIGH (ref 6–23)
Creatinine, Ser: 4.55 mg/dL — ABNORMAL HIGH (ref 0.50–1.35)
GFR calc non Af Amer: 11 mL/min — ABNORMAL LOW (ref >90)
Phosphorus: 6 mg/dL — ABNORMAL HIGH (ref 2.3–4.6)
Potassium: 4.3 mEq/L (ref 3.5–5.1)

## 2013-05-29 LAB — PROTIME-INR: Prothrombin Time: 20.2 seconds — ABNORMAL HIGH (ref 11.6–15.2)

## 2013-05-29 LAB — HEPARIN INDUCED THROMBOCYTOPENIA PNL
UFH Low Dose 0.1 IU/mL: 3 % Release
UFH SRA Result: NEGATIVE

## 2013-05-29 MED ORDER — SODIUM CHLORIDE 0.9 % IJ SOLN: 3.0000 mL | INTRAMUSCULAR | Status: DC | PRN

## 2013-05-29 MED ORDER — MOVING RIGHT ALONG BOOK
Freq: Once | Status: AC
  Administered 2013-05-29: 17:00:00
  Filled 2013-05-29: qty 1

## 2013-05-29 MED ORDER — CALCIUM ACETATE 667 MG PO CAPS
667.0000 mg | ORAL_CAPSULE | Freq: Three times a day (TID) | ORAL | Status: DC
  Administered 2013-05-29 – 2013-06-03 (×16): 667 mg via ORAL
  Filled 2013-05-29 (×19): qty 1

## 2013-05-29 MED ORDER — AMIODARONE HCL 200 MG PO TABS
200.0000 mg | ORAL_TABLET | Freq: Two times a day (BID) | ORAL | Status: DC
  Administered 2013-05-29 – 2013-06-02 (×9): 200 mg via ORAL
  Filled 2013-05-29 (×10): qty 1

## 2013-05-29 MED ORDER — CARVEDILOL 3.125 MG PO TABS
1.5600 mg | ORAL_TABLET | Freq: Two times a day (BID) | ORAL | Status: DC
  Administered 2013-05-29 – 2013-06-03 (×10): 1.56 mg via ORAL
  Filled 2013-05-29 (×13): qty 0.5

## 2013-05-29 MED ORDER — SIMVASTATIN 40 MG PO TABS
40.0000 mg | ORAL_TABLET | Freq: Every evening | ORAL | Status: DC
  Administered 2013-05-29: 40 mg via ORAL
  Filled 2013-05-29 (×2): qty 1

## 2013-05-29 MED ORDER — GUAIFENESIN-DM 100-10 MG/5ML PO SYRP: 15.0000 mL | ORAL_SOLUTION | ORAL | Status: DC | PRN

## 2013-05-29 MED ORDER — LEVOTHYROXINE SODIUM 100 MCG PO TABS
100.0000 ug | ORAL_TABLET | Freq: Every day | ORAL | Status: DC
  Administered 2013-05-29 – 2013-06-03 (×5): 100 ug via ORAL
  Filled 2013-05-29 (×7): qty 1

## 2013-05-29 MED ORDER — SODIUM CHLORIDE 0.9 % IV SOLN: 250.0000 mL | INTRAVENOUS | Status: DC | PRN

## 2013-05-29 MED ORDER — SODIUM CHLORIDE 0.9 % IJ SOLN
3.0000 mL | Freq: Two times a day (BID) | INTRAMUSCULAR | Status: DC
  Administered 2013-05-29: 3 mL via INTRAVENOUS

## 2013-05-29 NOTE — Plan of Care (Signed)
Problem: Phase III Progression Outcomes Goal: Time patient transferred to PCTU/Telemetry POD Outcome: Completed/Met Date Met:  05/29/13 1025

## 2013-05-29 NOTE — Progress Notes (Signed)
S:  Was up walking with PT O:BP 108/41  Pulse 63  Temp(Src) 97.6 F (36.4 C) (Oral)  Resp 16  Ht 6\' 1"  (1.854 m)  Wt 106.5 kg (234 lb 12.6 oz)  BMI 30.98 kg/m2  SpO2 95%  Intake/Output Summary (Last 24 hours) at 05/29/13 0806 Last data filed at 05/29/13 0400  Gross per 24 hour  Intake    530 ml  Output   2648 ml  Net  -2118 ml   Weight change: 0.3 kg (10.6 oz) Gen: Awake and alert CVS: RRR Resp: clear Abd:+ BS NTND Ext:1+ edema Rt 2+ Lt NEURO: CNI no asterixis RT IJ perm cath   . amiodarone  400 mg Oral BID  . carvedilol  1.56 mg Oral BID WC  . ceFEPime (MAXIPIME) IV  2 g Intravenous Q T,Th,Sa-HD  . darbepoetin (ARANESP) injection - NON-DIALYSIS  60 mcg Subcutaneous Q Tue-1800  . dextromethorphan-guaiFENesin  1 tablet Oral BID  . feeding supplement  1 Container Oral TID BM  . levothyroxine  100 mcg Oral QAC breakfast  . multivitamin  1 tablet Oral Daily  . pantoprazole  40 mg Oral Daily  . sodium chloride  10-40 mL Intracatheter Q12H  . warfarin  2.5 mg Oral q1800  . Warfarin - Physician Dosing Inpatient   Does not apply q1800   Dg Chest Port 1 View  05/29/2013   *RADIOLOGY REPORT*  Clinical Data: Evaluate effusions  PORTABLE CHEST - 1 VIEW  Comparison: Prior radiograph from 05/26/2013  Findings: Right IJ central venous catheter is unchanged.  A left IJ Cordis catheter has been removed.  Median sternotomy wires are intact.  Cardiomegaly is unchanged.  There is been interval improvement in aeration of both lung bases, particularly the left.  Opacities persist, left greater than right. There is a persistent left pleural effusion.  No definite effusion is now seen on the right.  The osseous structures are unchanged.  IMPRESSION: Interval improvement in aeration of both lung bases with improved left pleural effusion as compared to 05/26/2013.   Original Report Authenticated By: Rise Mu, M.D.   BMET    Component Value Date/Time   NA 136 05/29/2013 0420   K 4.3  05/29/2013 0420   CL 97 05/29/2013 0420   CO2 29 05/29/2013 0420   GLUCOSE 100* 05/29/2013 0420   BUN 37* 05/29/2013 0420   CREATININE 4.55* 05/29/2013 0420   CALCIUM 8.0* 05/29/2013 0420   GFRNONAA 11* 05/29/2013 0420   GFRAA 13* 05/29/2013 0420   CBC    Component Value Date/Time   WBC 16.8* 05/29/2013 0420   RBC 3.05* 05/29/2013 0420   HGB 8.0* 05/29/2013 0420   HCT 24.5* 05/29/2013 0420   PLT 136* 05/29/2013 0420   MCV 80.3 05/29/2013 0420   MCH 26.2 05/29/2013 0420   MCHC 32.7 05/29/2013 0420   RDW 22.7* 05/29/2013 0420   LYMPHSABS 2.0 03/03/2011 0417   MONOABS 0.6 03/03/2011 0417   EOSABS 0.4 03/03/2011 0417   BASOSABS 0.0 03/03/2011 0417     Assessment: 1. Acute on CKD, made a small amt of urine yest 2. SP CABG 3. Anemia on aranesp  Plan: 1.  HD in AM 2. Cont with PT 3.  See if UO increases today 4. Start phoslo AC 5. Does he still need maxipime? Clois Treanor T

## 2013-05-29 NOTE — Progress Notes (Signed)
Speech Language Pathology Dysphagia Treatment Patient Details Name: [name] MRN: [id number] DOB: 1934-03-14 Today's Date: 05/29/2013 Time: 4098-1191 SLP Time Calculation (min): 13 min  Assessment / Plan / Recommendation Clinical Impression  Pt. seen for dysphagia treatment with portion of breakfast tray.  He denies difficulty with thin liquids since liquids upgraded to thin 7/23.  Mastication WFL's and no s/s aspiration.  Minimal reminders of strategies given.  Recommend continue regular texture and thin liquids.  No further ST needed.    Diet Recommendation  Continue with Current Diet: Regular;Thin liquid    SLP Plan All goals met   Pertinent Vitals/Pain none   Swallowing Goals  SLP Swallowing Goals Patient will utilize recommended strategies during swallow to increase swallowing safety with: Minimal cueing Swallow Study Goal #2 - Progress: Met  General Temperature Spikes Noted: No Respiratory Status: Room air Behavior/Cognition: Alert;Cooperative;Pleasant mood Oral Cavity - Dentition: Edentulous Patient Positioning: Upright in chair  Oral Cavity - Oral Hygiene Does patient have any of the following "at risk" factors?: Other - dysphagia Brush patient's teeth BID with toothbrush (using toothpaste with fluoride): Yes   Dysphagia Treatment Treatment focused on: Skilled observation of diet tolerance Treatment Methods/Modalities: Skilled observation Patient observed directly with PO's: Yes Type of PO's observed: Regular;Thin liquids Feeding: Able to feed self Liquids provided via: Cup Type of cueing: Verbal Amount of cueing: Minimal   GO     [name] M.Ed [employer] [phone]  05/29/2013

## 2013-05-29 NOTE — Progress Notes (Signed)
Rehab admissions - Evaluated for possible admission.  I met with patient, his wife, son and grandson.  Patient has just transferred to 2018.  I will follow up again on Monday for medical readiness and to see if there is a continued need for inpatient rehab.  Call me for questions.  #[phone]

## 2013-05-29 NOTE — Progress Notes (Addendum)
[facility] DAILY ICU PROGRESS NOTE                   [address]            [place] [address]          [phone]   3 Days Post-Op Procedure(s) (LRB): INSERTION OF DIALYSIS CATHETER (N/A)  Total Length of Stay:  LOS: 17 days   Subjective: Looks and feels better, made 25 cc urine  Objective: Vital signs in last 24 hours: Temp:  [97.3 F (36.3 C)-98.7 F (37.1 C)] 98.4 F (36.9 C) (07/25 0343) Pulse Rate:  [49-122] 58 (07/25 0700) Cardiac Rhythm:  [-] Sinus bradycardia (07/25 0600) Resp:  [7-26] 16 (07/25 0700) BP: (87-120)/(40-68) 108/41 mmHg (07/25 0700) SpO2:  [92 %-99 %] 97 % (07/25 0700) Weight:  [234 lb 5.6 oz (106.3 kg)-234 lb 12.6 oz (106.5 kg)] 234 lb 12.6 oz (106.5 kg) (07/25 0400)  Filed Weights   05/28/13 0645 05/28/13 1056 05/29/13 0400  Weight: 233 lb 11 oz (106 kg) 234 lb 5.6 oz (106.3 kg) 234 lb 12.6 oz (106.5 kg)    Weight change: 10.6 oz (0.3 kg)   Hemodynamic parameters for last 24 hours:    Intake/Output from previous day: 07/24 0701 - 07/25 0700 In: 530 [P.O.:480; IV Piggyback:50] Out: 2648 [Urine:25]  Intake/Output this shift:    Current Meds: Scheduled Meds: . amiodarone  400 mg Oral BID  . carvedilol  3.125 mg Oral BID WC  . ceFEPime (MAXIPIME) IV  2 g Intravenous Q T,Th,Sa-HD  . darbepoetin (ARANESP) injection - NON-DIALYSIS  60 mcg Subcutaneous Q Tue-1800  . dextromethorphan-guaiFENesin  1 tablet Oral BID  . feeding supplement  1 Container Oral TID BM  . levothyroxine  50 mcg Oral QAC breakfast  . multivitamin  1 tablet Oral Daily  . pantoprazole  40 mg Oral Daily  . sodium chloride  10-40 mL Intracatheter Q12H  . warfarin  2.5 mg Oral q1800  . Warfarin - Physician Dosing Inpatient   Does not apply q1800   Continuous Infusions: . sodium chloride     PRN Meds:.acetaminophen, albuterol, fentaNYL, food thickener, haloperidol lactate, HYDROcodone-acetaminophen, ondansetron (ZOFRAN) IV, sodium chloride  General  appearance: alert, cooperative and no distress Heart: S1, S2 normal and brady Lungs: dim in bases Abdomen: soft, nontender Extremities: edema conts to improve Wound: incis without signs of infection  Lab Results: CBC: Recent Labs  05/28/13 0230 05/29/13 0420  WBC 17.3* 16.8*  HGB 8.0* 8.0*  HCT 24.8* 24.5*  PLT 92* 136*   BMET:  Recent Labs  05/28/13 0230 05/29/13 0420  NA 132* 136  K 4.7 4.3  CL 95* 97  CO2 25 29  GLUCOSE 94 100*  BUN 52* 37*  CREATININE 5.55* 4.55*  CALCIUM 8.0* 8.0*    PT/INR:  Recent Labs  05/29/13 0420  LABPROT 20.2*  INR 1.78*     Radiology: Dg Chest Port 1 View  05/29/2013   *RADIOLOGY REPORT*  Clinical Data: Evaluate effusions  PORTABLE CHEST - 1 VIEW  Comparison: Prior radiograph from 05/26/2013  Findings: Right IJ central venous catheter is unchanged.  A left IJ Cordis catheter has been removed.  Median sternotomy wires are intact.  Cardiomegaly is unchanged.  There is been interval improvement in aeration of both lung bases, particularly the left.  Opacities persist, left greater than right. There is a persistent left pleural effusion.  No definite effusion is now seen on the right.  The osseous structures are unchanged.  IMPRESSION: Interval improvement in aeration of both lung bases with improved left pleural effusion as compared to 05/26/2013.   Original Report Authenticated By: Rise Mu, M.D.     Assessment/Plan: S/P Procedure(s) (LRB): INSERTION OF DIALYSIS CATHETER (N/A)  1 sinus brady, QTc looks prolonged, will check 12 lead to verify. Will decrease cored dose, may need to decrease amiodarone as well. 2 Hypothyroidism- TSH is elevated to 18.658, will increase synthroid from 50 to 100 mcg  3 INR up to 1.78- cont current dose 2.5 mg daily and follow 4 Day 5 maxepime for enterobacter in sputum, CXR is improved, May be able to transition to po abx 5 small amt urine, cont to monitor, Dialysis as per nephrology 6 cont  rehab- is a CIR candidate when medically stable    GOLD,WAYNE E 05/29/2013 7:20 AM  Patient seen and examined. Agree with above.  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

## 2013-05-29 NOTE — Progress Notes (Signed)
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

## 2013-05-29 NOTE — Progress Notes (Signed)
Physical Therapy Treatment Patient Details Name: [name] MRN: [id number] DOB: 11/30/1933 Today's Date: 05/29/2013 Time: 7829-5621 PT Time Calculation (min): 29 min  PT Assessment / Plan / Recommendation  History of Present Illness Pt transferred from [facility] with NSTEMI s/p CABG with elevated creatinine and lethargy, CVVHD x 3 days   Clinical Impression Pt continues to progress with mobility slowly. Pt reports nausea and dizziness with all mobility and that he was experiencing the same at home at least once a month. Pt unable to endorse that any movement makes it worse because he is nauseated all the time. Pt able to perform visual tracking without difficulty with initial gaze to left creating nystagmus but not duplicated and pt reports no change in symptoms. No vomiting. Pt continues to benefit from max encouragement. Will continue to follow with attempts at gaze stabilization.   PT Comments     Follow Up Recommendations  CIR;Supervision/Assistance - 24 hour;SNF     Does the patient have the potential to tolerate intense rehabilitation     Barriers to Discharge        Equipment Recommendations       Recommendations for Other Services    Frequency     Progress towards PT Goals Progress towards PT goals: Progressing toward goals  Plan Discharge plan needs to be updated    Precautions / Restrictions Precautions Precautions: Sternal;Fall   Pertinent Vitals/Pain 8/10 chronic right hip arthritic pain, premedicated, repositioned, RN aware HR 59-64 sats 91-95% on Ra    Mobility  Bed Mobility Bed Mobility: Rolling Left;Left Sidelying to Sit;Sitting - Scoot to [place] of Bed Rolling Left: 4: Min assist Left Sidelying to Sit: 3: Mod assist;HOB flat Sitting - Scoot to Edge of Bed: 5: Supervision Details for Bed Mobility Assistance: cueing for sequence and precautions with particular cueing not to pull Transfers Sit to Stand: 4: Min assist;From bed Stand to Sit: 4: Min  assist;To chair/3-in-1 Details for Transfer Assistance: cueing for hand placement and precautions Ambulation/Gait Ambulation/Gait Assistance: 4: Min assist Ambulation Distance (Feet): 65 Feet Assistive device: Rolling walker Ambulation/Gait Assistance Details: cueing for gaze stabilization but pt unable to maintain despite max cueing. Cued for posture and position in Rw Gait Pattern: Trunk flexed;Shuffle Gait velocity: decreased    Exercises General Exercises - Lower Extremity Long Arc Quad: AROM;20 reps;Both;Seated Hip Flexion/Marching: AROM;Both;20 reps;Seated   PT Diagnosis:    PT Problem List:   PT Treatment Interventions:     PT Goals (current goals can now be found in the care plan section)    Visit Information  Last PT Received On: 05/29/13 Assistance Needed: +1 History of Present Illness: Pt transferred from [facility] with NSTEMI s/p CABG with elevated creatinine and lethargy, CVVHD x 3 days    Subjective Data      Cognition  Cognition Arousal/Alertness: Awake/alert Behavior During Therapy: Flat affect Orientation Level: Disoriented to;Time Current Attention Level: Selective Memory: Decreased recall of precautions;Decreased short-term memory    Balance     End of Session PT - End of Session Equipment Utilized During Treatment: Gait belt Activity Tolerance: Other (comment) (limited by nausea) Patient left: in chair;with call bell/phone within reach Nurse Communication: Mobility status   GP     Delorse Lek 05/29/2013, 8:02 AM Delaney Meigs, PT [phone]

## 2013-05-29 NOTE — Progress Notes (Signed)
CARDIAC REHAB PHASE I   PRE:  Rate/Rhythm: 58SB  BP:  Supine:   Sitting: 108/51  Standing:    SaO2: 95%RA  MODE:  Ambulation: 16 ft   POST:  Rate/Rhythm: 61SR  BP:  Supine: 109/48  Sitting:   Standing: 77/47   SaO2: 98%RA 1330-1425 Pt walked 16 ft on RA with gait belt use and rolling walker and asst x 2. One asst to follow with chair. Pt had to sit down twice to walk 16 ft due to dizziness and nausea. Wheeled pt back to room and had him stand for BP. Pt could barely stand long enough for BP check. York Spaniel he was getting sick. BP 77/47. Assisted to lying position and BP 109/48. Pt felt better lying down. Notified pt's RN.   Luetta Nutting, RN BSN  05/29/2013 2:21 PM

## 2013-05-30 LAB — CBC
HCT: 26.7 % — ABNORMAL LOW (ref 39.0–52.0)
Hemoglobin: 8.4 g/dL — ABNORMAL LOW (ref 13.0–17.0)
RBC: 3.3 MIL/uL — ABNORMAL LOW (ref 4.22–5.81)
WBC: 12.5 10*3/uL — ABNORMAL HIGH (ref 4.0–10.5)

## 2013-05-30 LAB — RENAL FUNCTION PANEL
CO2: 23 mEq/L (ref 19–32)
Calcium: 8.1 mg/dL — ABNORMAL LOW (ref 8.4–10.5)
Chloride: 95 mEq/L — ABNORMAL LOW (ref 96–112)
GFR calc Af Amer: 8 mL/min — ABNORMAL LOW (ref >90)
Glucose, Bld: 99 mg/dL (ref 70–99)
Potassium: 4.7 mEq/L (ref 3.5–5.1)
Sodium: 134 mEq/L — ABNORMAL LOW (ref 135–145)

## 2013-05-30 LAB — PROTIME-INR: INR: 1.82 — ABNORMAL HIGH (ref 0.00–1.49)

## 2013-05-30 MED ORDER — ATORVASTATIN CALCIUM 20 MG PO TABS
20.0000 mg | ORAL_TABLET | Freq: Every day | ORAL | Status: DC
  Administered 2013-05-30 – 2013-06-02 (×4): 20 mg via ORAL
  Filled 2013-05-30 (×5): qty 1

## 2013-05-30 MED ORDER — PROMETHAZINE HCL 25 MG/ML IJ SOLN
12.5000 mg | Freq: Four times a day (QID) | INTRAMUSCULAR | Status: DC | PRN
  Administered 2013-05-30 – 2013-06-01 (×2): 12.5 mg via INTRAVENOUS
  Filled 2013-05-30 (×2): qty 1

## 2013-05-30 MED ORDER — HYDROCODONE-ACETAMINOPHEN 5-325 MG PO TABS
ORAL_TABLET | ORAL | Status: AC
  Administered 2013-05-30: 2 via ORAL
  Filled 2013-05-30: qty 2

## 2013-05-30 NOTE — Progress Notes (Signed)
Nursing Note: Central Telemetry notified me of patient going into AFIB. EKG done to confirm. Notified Coral Ceo, PA and she came to check on him. No new orders. Will continue to monitor closely. Lajuana Matte, RN

## 2013-05-30 NOTE — Procedures (Signed)
I was present at this dialysis session. I have reviewed the session itself and made appropriate changes.   Minimal UOP overnight, still requires renal replacement therapy (i.e dialysis). Exam unchanged, +LE edema, lungs clear  [name] Moselle  MD Pager [phone]    Cell  [phone] 05/30/2013, 11:55 AM

## 2013-05-30 NOTE — Progress Notes (Signed)
CARDIAC REHAB PHASE I   PRE:  Rate/Rhythm: 71 sinus rhythm  BP:  Supine:   Sitting: 97/47  Standing:    SaO2: 90% up to 95% with PLB   MODE:  Ambulation: 12 ft   POST:  Rate/Rhythem: 76  BP:  Supine:   Sitting: 106/46  Standing:    SaO2: 92% ra 1415-1435pt reluctantly attempted to ambulate.  Pt OOB ambulated to doorway before c/o fatigue and worsening leg cramps. Otherwise asymptomatic Pt assisted to chair. Pt in chair at bedside with call light in reach.  [place], [place]

## 2013-05-30 NOTE — Progress Notes (Signed)
Nursing Note:  Patient ambulated with RW 30 feet. Patient had to rest at the door of his room and then returned to bed. Patient still complaining of nausea. Will continue to monitor closely. Lajuana Matte, RN

## 2013-05-30 NOTE — Progress Notes (Addendum)
[address]       [place] [address]             [phone]          4 Days Post-Op Procedure(s) (LRB): INSERTION OF DIALYSIS CATHETER (N/A)  Subjective: Just back to floor after HD and went into AF, rates 100-110s.  Weak, nauseated.   Objective: Vital signs in last 24 hours: Patient Vitals for the past 24 hrs:  BP Temp Temp src Pulse Resp SpO2 Weight  05/30/13 1110 118/56 mmHg - - 72 15 - 223 lb 1.7 oz (101.2 kg)  05/30/13 1100 109/61 mmHg - - 71 15 - -  05/30/13 1030 121/56 mmHg - - 78 15 - -  05/30/13 1000 93/47 mmHg - - 68 19 - -  05/30/13 0930 112/55 mmHg - - 71 14 - -  05/30/13 0900 105/57 mmHg - - 74 15 - -  05/30/13 0839 103/61 mmHg - - 69 14 - -  05/30/13 0830 97/49 mmHg - - 66 9 - -  05/30/13 0800 111/47 mmHg - - - 16 - -  05/30/13 0730 111/58 mmHg - - - 17 - -  05/30/13 0707 124/63 mmHg - - 64 17 - -  05/30/13 0702 123/66 mmHg 98.1 F (36.7 C) Oral 66 16 92 % 230 lb 2.6 oz (104.4 kg)  05/30/13 0500 123/53 mmHg 98.2 F (36.8 C) Oral 66 18 92 % 230 lb 2.6 oz (104.4 kg)  05/29/13 2202 103/46 mmHg 98.1 F (36.7 C) Oral 64 17 100 % -  05/29/13 1730 94/47 mmHg - - 63 - - -  05/29/13 1500 100/63 mmHg 97.5 F (36.4 C) Oral 67 16 98 % -   Current Weight  05/30/13 223 lb 1.7 oz (101.2 kg)     Intake/Output from previous day: 07/25 0701 - 07/26 0700 In: 120 [P.O.:120] Out: -     PHYSICAL EXAM:  Heart: Irr irr, rates 100s Lungs: Clear Wound: Clean and dry Extremities: Mild LE edema    Lab Results: CBC: Recent Labs  05/29/13 0420 05/30/13 0535  WBC 16.8* 12.5*  HGB 8.0* 8.4*  HCT 24.5* 26.7*  PLT 136* 186   BMET:  Recent Labs  05/29/13 0420 05/30/13 0535  NA 136 134*  K 4.3 4.7  CL 97 95*  CO2 29 23  GLUCOSE 100* 99  BUN 37* 62*  CREATININE 4.55* 6.68*  CALCIUM 8.0* 8.1*    PT/INR:  Recent Labs  05/30/13 0535  LABPROT 20.5*  INR 1.82*      Assessment/Plan: S/P Procedure(s) (LRB): INSERTION OF  DIALYSIS CATHETER (N/A)  CV-back in AF, rates around 100.  Had not received am meds due to being on HD, so RN just gave Amio.  Will monitor closely. He has had issues with bradycardia requiring Coreg and Amio doses to be decreased.  BPs low this am from HD, so will not give further beta blocker for now. Continue Coumadin.  A/CKD- per renal.   Enterobacter in sputum- D#6 Maxepime. WBC trending down, no fever, little cough.  Pt had +HIT panel, however, his SRA study is negative for HIT. Heparin d/c'ed.  Anemia- stable.  Deconditioning- Continue PT.  Disp- hopefully to CIR/SNF soon.    LOS: 18 days    COLLINS,GINA H 05/30/2013  patient examined and medical record reviewed,agree with above note 2-D echo shows good recovery of LV with EF 50% Continue amiodarone and Coumadin for A. Fib Will probably  need hemodialysis for another week to 2 weeks until kidneys recover. VAN TRIGT III,Mahdiya Mossberg 05/30/2013

## 2013-05-31 ENCOUNTER — Inpatient Hospital Stay (HOSPITAL_COMMUNITY): Payer: Medicare Other

## 2013-05-31 LAB — RENAL FUNCTION PANEL
Albumin: 2 g/dL — ABNORMAL LOW (ref 3.5–5.2)
CO2: 30 mEq/L (ref 19–32)
Chloride: 97 mEq/L (ref 96–112)
GFR calc Af Amer: 12 mL/min — ABNORMAL LOW (ref >90)
GFR calc non Af Amer: 10 mL/min — ABNORMAL LOW (ref >90)
Potassium: 3.9 mEq/L (ref 3.5–5.1)
Sodium: 136 mEq/L (ref 135–145)

## 2013-05-31 LAB — CBC
Hemoglobin: 8.2 g/dL — ABNORMAL LOW (ref 13.0–17.0)
Platelets: 231 10*3/uL (ref 150–400)
RBC: 3.12 MIL/uL — ABNORMAL LOW (ref 4.22–5.81)

## 2013-05-31 LAB — PROTIME-INR
INR: 2.22 — ABNORMAL HIGH (ref 0.00–1.49)
Prothrombin Time: 23.9 seconds — ABNORMAL HIGH (ref 11.6–15.2)

## 2013-05-31 LAB — IRON AND TIBC
Saturation Ratios: 10 % — ABNORMAL LOW (ref 20–55)
TIBC: 250 ug/dL (ref 215–435)

## 2013-05-31 MED ORDER — WARFARIN SODIUM 2.5 MG PO TABS
2.5000 mg | ORAL_TABLET | Freq: Every day | ORAL | Status: DC
  Administered 2013-06-01: 2.5 mg via ORAL
  Filled 2013-05-31 (×3): qty 1

## 2013-05-31 NOTE — Progress Notes (Addendum)
       [address]       [name] [address]             [phone]          5 Days Post-Op Procedure(s) (LRB): INSERTION OF DIALYSIS CATHETER (N/A)  Subjective: Feels better today, less weak. Nausea resolved and eating a little better. Back in SR.   Objective: Vital signs in last 24 hours: Patient Vitals for the past 24 hrs:  BP Temp Temp src Pulse Resp SpO2 Weight  05/31/13 0542 117/56 mmHg 98.3 F (36.8 C) Oral 58 14 94 % 230 lb 13.2 oz (104.7 kg)  05/30/13 2057 113/54 mmHg 98.5 F (36.9 C) Oral 66 18 96 % -  05/30/13 1351 101/55 mmHg 98 F (36.7 C) Oral 100 20 93 % -  05/30/13 1110 118/56 mmHg - - 72 15 - 223 lb 1.7 oz (101.2 kg)  05/30/13 1100 109/61 mmHg - - 71 15 - -  05/30/13 1030 121/56 mmHg - - 78 15 - -   Current Weight  05/31/13 230 lb 13.2 oz (104.7 kg)     Intake/Output from previous day: 07/26 0701 - 07/27 0700 In: -  Out: 2900     PHYSICAL EXAM:  Heart: RRR Lungs: Crackles in bases bilaterally Wound: Clean and dry Extremities: Mild LE edema    Lab Results: CBC: Recent Labs  05/30/13 0535 05/31/13 0425  WBC 12.5* 12.0*  HGB 8.4* 8.2*  HCT 26.7* 26.2*  PLT 186 231   BMET:  Recent Labs  05/30/13 0535 05/31/13 0425  NA 134* 136  K 4.7 3.9  CL 95* 97  CO2 23 30  GLUCOSE 99 93  BUN 62* 37*  CREATININE 6.68* 4.89*  CALCIUM 8.1* 8.1*    PT/INR:  Recent Labs  05/31/13 0425  LABPROT 23.9*  INR 2.22*      Assessment/Plan: S/P Procedure(s) (LRB): INSERTION OF DIALYSIS CATHETER (N/A)  CV-AF, now SR. Continue current meds and monitor closely. Will hold Coumadin  today so EPWs can be removed in am.   A/CKD- For HD on Tuesday per renal.  Enterobacter in sputum- D#7 Maxepime. WBC trending down, no fever, little cough. ?d/c soon.  Anemia- stable.   Deconditioning- Continue PT.   Disp- hopefully to CIR soon (cannot get outpatient HD while at SNF as he is not officially ESRD).    LOS: 19 days     COLLINS,GINA H 05/31/2013  Should be ready for CIR Mon Incisions clean and dry patient examined and medical record reviewed,agree with above note. VAN TRIGT III,PETER 05/31/2013

## 2013-05-31 NOTE — Progress Notes (Signed)
Patient did not feel like completing an evening walk. Will continue to monitor.

## 2013-05-31 NOTE — Progress Notes (Signed)
Patient ambulated 40 feet with RW on RA. Patient complained of feeling sick to stomach. Patient returned to bed on his side. Will continue to monitor closely. Lajuana Matte, RN

## 2013-05-31 NOTE — Progress Notes (Signed)
S:  Eating well.  Bowels moving.  No SOB O:BP 117/56  Pulse 58  Temp(Src) 98.3 F (36.8 C) (Oral)  Resp 14  Ht 6\' 1"  (1.854 m)  Wt 104.7 kg (230 lb 13.2 oz)  BMI 30.46 kg/m2  SpO2 94%  Intake/Output Summary (Last 24 hours) at 05/31/13 0755 Last data filed at 05/30/13 1110  Gross per 24 hour  Intake      0 ml  Output   2900 ml  Net  -2900 ml   Weight change: 0 kg (0 lb) Gen: Awake and alert CVS: RRR Resp: clear Abd:+ BS NTND Ext:0 edema Rt 1+ Lt NEURO: CNI no asterixis RT IJ perm cath   . amiodarone  200 mg Oral BID  . atorvastatin  20 mg Oral q1800  . calcium acetate  667 mg Oral TID WC  . carvedilol  1.56 mg Oral BID WC  . ceFEPime (MAXIPIME) IV  2 g Intravenous Q [name],Th,Sa-HD  . darbepoetin (ARANESP) injection - NON-DIALYSIS  60 mcg Subcutaneous Q Tue-1800  . feeding supplement  1 Container Oral TID BM  . levothyroxine  100 mcg Oral QAC breakfast  . multivitamin  1 tablet Oral Daily  . pantoprazole  40 mg Oral Daily  . sodium chloride  3 mL Intravenous Q12H  . warfarin  2.5 mg Oral q1800  . Warfarin - Physician Dosing Inpatient   Does not apply q1800   Dg Chest 2 View  05/31/2013   *RADIOLOGY REPORT*  Clinical Data: Status post CABG.  CHEST - 2 VIEW  Comparison: Chest x-ray 05/29/2013.  Findings: Right subclavian PermCath with tips terminating in the superior cavoatrial junction and superior aspect of the right atrium.  Lung volumes are normal.  Mild cephalization of the pulmonary vasculature, without frank pulmonary edema.  No significant pleural effusions.  No definite consolidative airspace disease.  Mild enlargement of the cardiopericardial silhouette, unchanged compared to the prior examination.  Status post median sternotomy.  Atherosclerosis of the thoracic aorta.  Right subclavian central venous catheter with tip terminating in the distal superior vena cava.  IMPRESSION: 1.  Support apparatus and postoperative changes, as above. 2.  Mild cardiomegaly with mild  pulmonary venous congestion, but no frank pulmonary edema. 3.  Atherosclerosis.   Original Report Authenticated By: Trudie Reed, M.D.   BMET    Component Value Date/Time   NA 136 05/31/2013 0425   K 3.9 05/31/2013 0425   CL 97 05/31/2013 0425   CO2 30 05/31/2013 0425   GLUCOSE 93 05/31/2013 0425   BUN 37* 05/31/2013 0425   CREATININE 4.89* 05/31/2013 0425   CALCIUM 8.1* 05/31/2013 0425   GFRNONAA 10* 05/31/2013 0425   GFRAA 12* 05/31/2013 0425   CBC    Component Value Date/Time   WBC 12.0* 05/31/2013 0425   RBC 3.12* 05/31/2013 0425   HGB 8.2* 05/31/2013 0425   HCT 26.2* 05/31/2013 0425   PLT 231 05/31/2013 0425   MCV 84.0 05/31/2013 0425   MCH 26.3 05/31/2013 0425   MCHC 31.3 05/31/2013 0425   RDW 22.9* 05/31/2013 0425   LYMPHSABS 2.0 03/03/2011 0417   MONOABS 0.6 03/03/2011 0417   EOSABS 0.4 03/03/2011 0417   BASOSABS 0.0 03/03/2011 0417     Assessment: 1. Acute on CKD, 2. SP CABG 3. Anemia on aranesp 4. A fib  Plan: 1. Inpt rehab or LTAC would be the best for now as he will not be able to get outpt HD in a SNF as he cannot be  labeled ESRD yet (as ridiculous as that sounds ) 2. Plan HD tues 3. Will check iron studies     [name]

## 2013-05-31 NOTE — Progress Notes (Signed)
Patient did not feel like walking tonight.  Patient has been nauseated and received phenergan and is sleepy. Will continue to monitor.

## 2013-05-31 NOTE — Progress Notes (Signed)
Patient ambulated 30 feet using a RW on RA. Patient had to stop and rest and stated "my legs were about to give out". Returned patient to the bed. Will continue to monitor closely. Lajuana Matte, RN

## 2013-06-01 LAB — CBC
Hemoglobin: 8.2 g/dL — ABNORMAL LOW (ref 13.0–17.0)
MCH: 25.8 pg — ABNORMAL LOW (ref 26.0–34.0)
MCHC: 30.7 g/dL (ref 30.0–36.0)
MCV: 84 fL (ref 78.0–100.0)

## 2013-06-01 LAB — RENAL FUNCTION PANEL
CO2: 24 mEq/L (ref 19–32)
Calcium: 8.1 mg/dL — ABNORMAL LOW (ref 8.4–10.5)
Creatinine, Ser: 6.81 mg/dL — ABNORMAL HIGH (ref 0.50–1.35)
GFR calc Af Amer: 8 mL/min — ABNORMAL LOW (ref >90)
Glucose, Bld: 83 mg/dL (ref 70–99)
Phosphorus: 5.2 mg/dL — ABNORMAL HIGH (ref 2.3–4.6)
Sodium: 135 mEq/L (ref 135–145)

## 2013-06-01 MED ORDER — SODIUM CHLORIDE 0.9 % IV SOLN
125.0000 mg | INTRAVENOUS | Status: DC
  Administered 2013-06-02: 125 mg via INTRAVENOUS
  Filled 2013-06-01 (×2): qty 10

## 2013-06-01 MED ORDER — BOOST / RESOURCE BREEZE PO LIQD
1.0000 | Freq: Three times a day (TID) | ORAL | Status: DC
  Administered 2013-06-02: 1 via ORAL

## 2013-06-01 NOTE — Progress Notes (Signed)
Pt refused to ambulate due to nausea and dizziness,phenergan was given earlier,will continue to monitor.

## 2013-06-01 NOTE — Progress Notes (Addendum)
      [address]       [name] [address]             [phone]      6 Days Post-Op Procedure(s) (LRB): INSERTION OF DIALYSIS CATHETER (N/A)  Subjective:  [name] is without complaints this morning.  He is ambulating with some assistance.  He will require inpatient rehab at discharge. +BM  Objective: Vital signs in last 24 hours: Temp:  [98.2 F (36.8 C)-98.4 F (36.9 C)] 98.3 F (36.8 C) (07/28 0557) Pulse Rate:  [63-69] 69 (07/28 0557) Cardiac Rhythm:  [-] Normal sinus rhythm (07/28 0738) Resp:  [18-22] 18 (07/28 0557) BP: (103-120)/(49-66) 118/54 mmHg (07/28 0557) SpO2:  [92 %-94 %] 92 % (07/28 0557) Weight:  [227 lb 8.2 oz (103.2 kg)] 227 lb 8.2 oz (103.2 kg) (07/28 0557)  General appearance: alert, cooperative and no distress Neurologic: intact Heart: regular rate and rhythm Lungs: clear to auscultation bilaterally Abdomen: soft, non-tender; bowel sounds normal; no masses,  no organomegaly Extremities: edema trace Wound: clean and dry  Lab Results:  Recent Labs  05/31/13 0425 06/01/13 0500  WBC 12.0* 10.3  HGB 8.2* 8.2*  HCT 26.2* 26.7*  PLT 231 251   BMET:  Recent Labs  05/31/13 0425 06/01/13 0500  NA 136 135  K 3.9 4.1  CL 97 97  CO2 30 24  GLUCOSE 93 83  BUN 37* 56*  CREATININE 4.89* 6.81*  CALCIUM 8.1* 8.1*    PT/INR:  Recent Labs  06/01/13 0500  LABPROT 25.4*  INR 2.40*   ABG    Component Value Date/Time   PHART 7.343* 05/16/2013 0409   HCO3 20.3 05/16/2013 0409   TCO2 21 05/16/2013 0409   ACIDBASEDEF 5.0* 05/16/2013 0409   O2SAT 90.0 05/16/2013 0409   CBG (last 3)  No results found for this basename: GLUCAP,  in the last 72 hours  Assessment/Plan: S/P Procedure(s) (LRB): INSERTION OF DIALYSIS CATHETER (N/A)  1. CV- NSR rate and pressure controlled- on Amiodarone and Coreg 2. Acute Real failure on CKD- requiring HD currently every T/Th/Sat, Nephrology following, hopeful function may recover 3.  Enterobacter in Sputum- completed 7 day course of Maxipime will d/c 4. Deconditioning- PT/OT 5. Dispo- patient is medically stable at this time, ready for discharge to CIR vs Ltach   LOS: 20 days    [name] 06/01/2013   Chart reviewed, patient examined, agree with above. He is medically stable. Awaiting LTAC placement.

## 2013-06-01 NOTE — Progress Notes (Signed)
Rehab admissions - Patient is doing well with therapies.  My concern is that he is still requiring temporary HD, which could turn into permanent HD.  If I brought him to CIR, he would only need a short LOS, but I would not be able to discharge him because of the HD issue.  I have talked to Sidney Ace, CM and I feel we need to look into LTACH to allow a longer time for inpatient as well as get him some therapies.  I will continue to follow progress.  Call me for questions.  #[phone]

## 2013-06-01 NOTE — Progress Notes (Signed)
Pt given 4mg  IV Zofran at this time; will cont. To monitor.

## 2013-06-01 NOTE — Progress Notes (Signed)
Pt c/o nausea at this time; pt not wanting to ambulate due to nausea and dizziness; will attempt to ambulate later today.

## 2013-06-01 NOTE — Progress Notes (Signed)
CARDIAC REHAB PHASE I   PRE:  Rate/Rhythm: 69 SR    BP: lying 120/50, sitting 110/50, standing 100/50    SaO2: 95 RA on ear  MODE:  Ambulation: 40 ft   POST:  Rate/Rhythm: 72 SR    BP: sitting 100/46     SaO2: 95-96 RA ear  Pt continues to be dizzy and nauseated sitting up and walking. Reluctant to walk but agreed. Stood and took [employer]ista which were mild then pt had to sit and rest due to dizziness. Encouraged pt to try to walk. Able to total 40 ft assist x2 with RW. Had to sit x3 for 40 ft then be rolled back to room in w/c due to continued dizziness and nausea. Spoke with PT who suggested vestibular assessment. Pt to bed after walk. Will f/u. [phone]  Elissa Lovett [place] CES, ACSM 06/01/2013 10:20 AM

## 2013-06-01 NOTE — Progress Notes (Signed)
NUTRITION FOLLOW UP  Intervention:   Resource Breeze PO TID, each supplement provides 250 kcal and 9 grams of protein. Discontinue Ensure Pudding.  Nutrition Dx:   Inadequate oral intake now related to poor appetite as evidenced by poor meal completion. Ongoing.  Goal:   Intake to meet >90% of estimated nutrition needs. Unmet.  Monitor:   weight trends, labs, po intake, supplement tolerance  Assessment:   Pt transferred from [facility] related to STEMI. Pt required emergent intubation related to acute respiratory failure in the setting of ischemia. S/p cardiac cath and CABG on 7/9.   Patient continues with poor appetite and poor oral intake. Receiving intermittent HD (via temporary dialysis catheter) on TTS schedule with hopes of recovery of renal function. May need LTACH placement. Patient is not eating the Ensure Pudding supplement. Will try [employer] instead.  Labs reviewed: sodium and potassium WNL. Phosphorus now slightly elevated.  Height: Ht Readings from Last 1 Encounters:  05/12/13 6\' 1"  (1.854 m)    Weight Status:   Wt Readings from Last 1 Encounters:  06/01/13 227 lb 8.2 oz (103.2 kg)  05/25/13  245 lb 9.5 oz (111.4 kg)  Admit wt: 243 lb (UBW: 235 - 245 lb)  Significant weight loss likely related to fluid status with HD and poor appetite with poor intake.  Body mass index is 30.02 kg/(m^2). Obese Class I  Re-estimated needs:  Kcal: 2000 - 2300 Protein: 155 - 175 g Fluid: per team  Skin:  Chest incision R leg incision L leg incision L tibial wound Moderate edema  Diet Order: Dysphagia 3 with thin liquids  PO's: 10-50% meal completion   Intake/Output Summary (Last 24 hours) at 06/01/13 1236 Last data filed at 06/01/13 0941  Gross per 24 hour  Intake    240 ml  Output      0 ml  Net    240 ml    Last BM: 7/25  Labs:   Recent Labs Lab 05/26/13 0410  05/30/13 0535 05/31/13 0425 06/01/13 0500  NA 138  < > 134* 136 135  K 4.0   < > 4.7 3.9 4.1  CL 99  < > 95* 97 97  CO2 25  < > 23 30 24   BUN 43*  < > 62* 37* 56*  CREATININE 4.02*  < > 6.68* 4.89* 6.81*  CALCIUM 8.0*  < > 8.1* 8.1* 8.1*  MG 2.8*  --   --   --   --   PHOS 4.4  < > 6.0* 4.2 5.2*  GLUCOSE 102*  < > 99 93 83  < > = values in this interval not displayed.  CBG (last 3)  No results found for this basename: GLUCAP,  in the last 72 hours  Scheduled Meds: . amiodarone  200 mg Oral BID  . atorvastatin  20 mg Oral q1800  . calcium acetate  667 mg Oral TID WC  . carvedilol  1.56 mg Oral BID WC  . darbepoetin (ARANESP) injection - NON-DIALYSIS  60 mcg Subcutaneous Q Tue-1800  . feeding supplement  1 Container Oral TID BM  . [START ON 06/02/2013] ferric gluconate (FERRLECIT/NULECIT) IV  125 mg Intravenous Q T,Th,Sa-HD  . levothyroxine  100 mcg Oral QAC breakfast  . multivitamin  1 tablet Oral Daily  . pantoprazole  40 mg Oral Daily  . sodium chloride  3 mL Intravenous Q12H  . warfarin  2.5 mg Oral q1800  . Warfarin - Physician Dosing Inpatient  Does not apply q1800    Continuous Infusions: None   Joaquin Courts, RD, LDN, CNSC Pager [phone] After Hours Pager [phone]

## 2013-06-01 NOTE — Progress Notes (Signed)
[name] Note:  ASSESSMENT/RECOMMENDATIONS  79yo WM with baseline CKD (1.7-1.7) with oligoanuric ischemic ATN post CP/cardiogenic shock, cath, CABG (7/9), requiring renal replacement therapy since 7/17 with CRRT, transitioned to intermittent HD on 7/21, now with TDC placed 7/22. On TTS schedule with hopes of recovery of function.  1. AKI on CKD, dialysis dependent since 7/17. Minimal UOP. Too early to call ESRD. Continue TTS schedule until evidence of recovery. Inpt rehab or LTAC would be the best for now as he will not be able to get outpt HD in a SNF as he cannot be labeled ESRD yet  2. SP CABG 3. Anemia -  on aranesp 60 QTues. Tsat low. Replete with HD X 10 doses (to start 7/29)  4. A fib, now SR. Amio/coumadin. 5. Deconditioning - ? CIR (cannot go to SNF and dialyze as outpt b/c cannot be deemed ESRD at this time).     S:  Says very fearful that he will "end up on dialysis for the rest of his life. No chest pain or SOB Minimal UOP - still very dark Wife in room with him "I just wish he could come home"  O:BP 118/54  Pulse 69  Temp(Src) 98.3 F (36.8 C) (Oral)  Resp 18  Ht 6\' 1"  (1.854 m)  Wt 103.2 kg (227 lb 8.2 oz)  BMI 30.02 kg/m2  SpO2 92% No intake or output data in the 24 hours ending 06/01/13 0720 Weight change: -1.2 kg (-2 lb 10.3 oz) Gen: Awake and alert CVS: Regular. Ht sds distant  Sternotomy scar healing well Resp: clear but diminished at bases Abd:+ BS NTND Ext: 1+ edema Rt 1+-2+ Lt Incisions clean and dry NEURO:  no asterixis  RT IJ perm cath (7/22)  Weights:  05/12/13 1411 110.5 kg  05/20/13 0500 124.921 kg  05/25/13 1503 109.3 kg  05/31/13 0542 104.7 kg  05/30/13 1110 101.2 kg  06/01/13 0557 103.2 kg   . amiodarone  200 mg Oral BID  . atorvastatin  20 mg Oral q1800  . calcium acetate  667 mg Oral TID WC  . carvedilol  1.56 mg Oral BID WC  . ceFEPime (MAXIPIME) IV  2 g Intravenous Q T,Th,Sa-HD  . darbepoetin (ARANESP) injection -  NON-DIALYSIS  60 mcg Subcutaneous Q Tue-1800  . feeding supplement  1 Container Oral TID BM  . levothyroxine  100 mcg Oral QAC breakfast  . multivitamin  1 tablet Oral Daily  . pantoprazole  40 mg Oral Daily  . sodium chloride  3 mL Intravenous Q12H  . warfarin  2.5 mg Oral q1800  . Warfarin - Physician Dosing Inpatient   Does not apply q1800   Dg Chest 2 View  05/31/2013   *RADIOLOGY REPORT*  Clinical Data: Status post CABG.  CHEST - 2 VIEW  Comparison: Chest x-ray 05/29/2013.  Findings: Right subclavian PermCath with tips terminating in the superior cavoatrial junction and superior aspect of the right atrium.  Lung volumes are normal.  Mild cephalization of the pulmonary vasculature, without frank pulmonary edema.  No significant pleural effusions.  No definite consolidative airspace disease.  Mild enlargement of the cardiopericardial silhouette, unchanged compared to the prior examination.  Status post median sternotomy.  Atherosclerosis of the thoracic aorta.  Right subclavian central venous catheter with tip terminating in the distal superior vena cava.  IMPRESSION: 1.  Support apparatus and postoperative changes, as above. 2.  Mild cardiomegaly with mild pulmonary venous congestion, but no frank pulmonary edema. 3.  Atherosclerosis.  Original Report Authenticated By: Trudie Reed, M.D.   Basic Metabolic Panel:  Recent Labs  41/32/44 0535 05/31/13 0425  NA 134* 136  K 4.7 3.9  CL 95* 97  CO2 23 30  GLUCOSE 99 93  BUN 62* 37*  CREATININE 6.68* 4.89*  CALCIUM 8.1* 8.1*  PHOS 6.0* 4.2    Recent Labs  05/30/13 0535 05/31/13 0425  ALBUMIN 1.9* 2.0*    Recent Labs  05/31/13 0425 06/01/13 0500  WBC 12.0* 10.3  HGB 8.2* 8.2*  HCT 26.2* 26.7*  MCV 84.0 84.0  PLT 231 251   Results for [name], [name] (MRN [id number]) as of 06/01/2013 07:32  Ref. Range 05/31/2013 10:33  Iron Latest Range: 42-135 ug/dL 26 (L)  UIBC Latest Range: 125-400 ug/dL 644  TIBC Latest Range:  215-435 ug/dL 034  Saturation Ratios Latest Range: 20-55 % 10 (L)       [name] B

## 2013-06-01 NOTE — Progress Notes (Signed)
Pt resting at this time; no nausea noted; will cont. To monitor.

## 2013-06-01 NOTE — Progress Notes (Signed)
Physical Therapy Treatment Patient Details Name: [name] MRN: [id number] DOB: September 20, 1934 Today's Date: 06/01/2013 Time: 1410-1441 PT Time Calculation (min): 31 min  PT Assessment / Plan / Recommendation  History of Present Illness Pt transferred from [facility] with NSTEMI s/p CABG with elevated creatinine and lethargy, CVVHD x 3 days   Clinical Impression Pt admitted with NSTEMI and CABG. Pt currently with functional limitations due to nausea.  Assessed for BPPV with positive right modified Hallpike.  Treated via canalith repositioning maneuver which appeared to be effective after treatment.  Will follow up and recheck pt tomorrow.  Noted Rehab's recommendation for LTACH.  Agree that pt needs continued therapy and LTACH is a good option.   Pt will benefit from skilled PT to increase their independence and safety with mobility to allow discharge to the venue listed below.        Follow Up Recommendations  LTACH;Supervision/Assistance - 24 hour                 Equipment Recommendations  None recommended by PT    Recommendations for Other Services OT consult  Frequency Min 3X/week   Progress towards PT Goals Progress towards PT goals: Progressing toward goals  Plan Discharge plan needs to be updated    Precautions / Restrictions Precautions Precautions: Sternal;Fall Precaution Comments: vertigo Restrictions Weight Bearing Restrictions: No   Pertinent Vitals/Pain VSS, no pain    Mobility  Bed Mobility Bed Mobility: Right Sidelying to Sit;Left Sidelying to Sit;Rolling Left;Sitting - Scoot to Edge of Bed Rolling Left: 4: Min assist Right Sidelying to Sit: 4: Min assist Left Sidelying to Sit: 4: Min assist Sitting - Scoot to Edge of Bed: 5: Supervision Sit to Supine: Not Tested (comment) Details for Bed Mobility Assistance: cueing for sequence and precautions with particular cueing not to pull.  Assessed pt for BPPV as PT received order from MD for vestibular eval.   Modified Hallpike to left was negative.  Modified Hallpike to right was positive.  Performed right canalith repositioning maneuver using reverse trendelenberg bed position given patient's recent surgery.  After positioning complete, assisted pt back to bed.  Pt reports that nausea was initially 8/10 and 5 min after positioning  nausea at 6/10.   Transfers Transfers: Sit to Stand;Stand to Sit Sit to Stand: 4: Min assist;From chair/3-in-1;Without upper extremity assist Stand to Sit: 4: Min assist;Without upper extremity assist;To bed Details for Transfer Assistance: cues needed for hand placement and for sternal precautions to place hands on knees  Ambulation/Gait Ambulation/Gait Assistance: 4: Min guard Ambulation Distance (Feet): 3 Feet Assistive device: Rolling walker Ambulation/Gait Assistance Details: cuing for posture Gait Pattern: Trunk flexed;Shuffle Gait velocity: decreased Stairs: No Wheelchair Mobility Wheelchair Mobility: No    PT Goals (current goals can now be found in the care plan section)    Visit Information  Last PT Received On: 06/01/13 Assistance Needed: +2 (to get appropriate vestibular positioning) History of Present Illness: Pt transferred from [facility] with NSTEMI s/p CABG with elevated creatinine and lethargy, CVVHD x 3 days    Subjective Data  Subjective: "I feel so nauseaous when I move. "   Cognition  Cognition Arousal/Alertness: Awake/alert Behavior During Therapy: Flat affect Overall Cognitive Status: Impaired/Different from baseline Area of Impairment: Orientation Orientation Level: Disoriented to;Time Current Attention Level: Selective Memory: Decreased recall of precautions;Decreased short-term memory General Comments: Pt alert today and was able to follow during PT treatment    Balance  Static Sitting Balance Static Sitting - Balance Support:  Feet supported;Bilateral upper extremity supported Static Sitting - Level of Assistance: 5: Stand by  assistance Static Sitting - Comment/# of Minutes: 3  End of Session PT - End of Session Equipment Utilized During Treatment: Gait belt Activity Tolerance: Patient tolerated treatment well Patient left: in bed;with call bell/phone within reach;with family/visitor present Nurse Communication: Mobility status        INGOLD,[name] 06/01/2013, 4:05 PM  [facility] [phone] (pager)

## 2013-06-02 DIAGNOSIS — Z951 Presence of aortocoronary bypass graft: Secondary | ICD-10-CM

## 2013-06-02 LAB — RENAL FUNCTION PANEL
Albumin: 1.9 g/dL — ABNORMAL LOW (ref 3.5–5.2)
CO2: 22 mEq/L (ref 19–32)
Calcium: 8.5 mg/dL (ref 8.4–10.5)
GFR calc Af Amer: 6 mL/min — ABNORMAL LOW (ref >90)
GFR calc non Af Amer: 5 mL/min — ABNORMAL LOW (ref >90)
Phosphorus: 6.2 mg/dL — ABNORMAL HIGH (ref 2.3–4.6)
Sodium: 136 mEq/L (ref 135–145)

## 2013-06-02 LAB — CBC
MCH: 24.8 pg — ABNORMAL LOW (ref 26.0–34.0)
Platelets: 282 10*3/uL (ref 150–400)
RBC: 3.1 MIL/uL — ABNORMAL LOW (ref 4.22–5.81)
RDW: 23 % — ABNORMAL HIGH (ref 11.5–15.5)

## 2013-06-02 LAB — GLUCOSE, CAPILLARY: Glucose-Capillary: 78 mg/dL (ref 70–99)

## 2013-06-02 MED ORDER — DARBEPOETIN ALFA-POLYSORBATE 60 MCG/0.3ML IJ SOLN
INTRAMUSCULAR | Status: AC
  Administered 2013-06-02: 60 ug via SUBCUTANEOUS
  Filled 2013-06-02: qty 0.3

## 2013-06-02 MED ORDER — MECLIZINE HCL 12.5 MG PO TABS
12.5000 mg | ORAL_TABLET | Freq: Two times a day (BID) | ORAL | Status: DC | PRN
  Administered 2013-06-02 (×2): 12.5 mg via ORAL
  Filled 2013-06-02 (×2): qty 1

## 2013-06-02 NOTE — Progress Notes (Signed)
Pt sitting up in chair; pt c/o dizziness; pt given Meclizine at this time; will cont. To monitor.

## 2013-06-02 NOTE — Progress Notes (Signed)
OT Cancellation Note   06/02/13 1200  OT Visit Information  Reason Eval/Treat Not Completed [name]/ unavailable ( Pt at HD. will attempt this pm.)   [facility], OTR/L  [phone] 06/02/2013

## 2013-06-02 NOTE — Procedures (Signed)
Pt initiating HD via TDC (plan 4 hours/400BFR/800 DFR).   Goal 3L.  2K bath.   To get Aranesp and ferrlecit today Pre HD labs K 4.5 creat 9.13 (6.81) Hb 7.7

## 2013-06-02 NOTE — Progress Notes (Signed)
Occupational Therapy Evaluation Patient Details Name: [name] MRN: [id number] DOB: June 03, 1934 Today's Date: 06/02/2013 Time: 1478-2956 OT Time Calculation (min): 21 min  OT Assessment / Plan / Recommendation History of present illness Pt transferred from [facility] with NSTEMI s/p CABG with elevated creatinine and lethargy, CVVHD x 3 days   Clinical Impression   PTA, pt was independent with all ADL and mobility. Pt presents with significant functional decline and is currently mod A with all ADL. And mobility and unable to ambulate due to fatigue after HD. Pt limited by lethargy and c/o dizziness in addition to generalized weakness. If family is able to provide 24/7 assistance, pt would be a good CIR candidate.    OT Assessment  Patient needs continued OT Services    Follow Up Recommendations  CIR    Barriers to Discharge      Equipment Recommendations  3 in 1 bedside comode    Recommendations for Other Services Rehab consult  Frequency  Min 2X/week    Precautions / Restrictions Precautions Precautions: Sternal;Fall Precaution Comments: vertigo Restrictions Weight Bearing Restrictions: No   Pertinent Vitals/Pain no apparent distress.    ADL  Eating/Feeding: Supervision/safety;Other (comment) (full supervision) Grooming: Minimal assistance Where Assessed - Grooming: Unsupported sitting Upper Body Bathing: Moderate assistance Where Assessed - Upper Body Bathing: Unsupported sitting Lower Body Bathing: Moderate assistance Where Assessed - Lower Body Bathing: Supported sit to stand Upper Body Dressing: Moderate assistance Where Assessed - Upper Body Dressing: Unsupported sitting Lower Body Dressing: Maximal assistance Where Assessed - Lower Body Dressing: Supported sit to stand Toilet Transfer: Moderate assistance Toilet Transfer Method: Sit to stand Toileting - Clothing Manipulation and Hygiene: Maximal assistance Equipment Used: Gait belt Transfers/Ambulation  Related to ADLs: mod A ADL Comments: functional decline    OT Diagnosis: Generalized weakness;Altered mental status  OT Problem List: Decreased strength;Decreased activity tolerance;Impaired balance (sitting and/or standing);Decreased cognition;Decreased safety awareness;Decreased knowledge of use of DME or AE;Decreased knowledge of precautions;Cardiopulmonary status limiting activity;Obesity OT Treatment Interventions: Self-care/ADL training;Therapeutic exercise;Energy conservation;DME and/or AE instruction;Therapeutic activities;Cognitive remediation/compensation;Patient/family education;Balance training   OT Goals(Current goals can be found in the care plan section) Acute Rehab OT Goals Patient Stated Goal: to go home OT Goal Formulation: With patient Time For Goal Achievement: 06/16/13 Potential to Achieve Goals: Good  Visit Information  Last OT Received On: 06/02/13 Assistance Needed: +2 (to get appropriate vestibular positioning) Reason Eval/Treat Not Completed: [name]/ unavailable ( Pt at HD. will attempt this pm.) History of Present Illness: Pt transferred from [facility] with NSTEMI s/p CABG with elevated creatinine and lethargy, CVVHD x 3 days       Prior Functioning     Home Living Family/patient expects to be discharged to:: Private residence Living Arrangements: Spouse/significant other;Children (sons can help at DC 24hrs) Available Help at Discharge: Family;Available 24 hours/day Type of Home: House Home Access: Stairs to enter [employer] of Steps: 3 Home Layout: One level Home Equipment: Walker - 2 wheels;Bedside commode;Wheelchair - manual Prior Function Level of Independence: Independent Communication Communication: No difficulties         Vision/Perception Vision - History Baseline Vision: Other (comment) Visual History:  (unable to assess)   Cognition  Cognition Arousal/Alertness: Awake/alert Behavior During  Therapy: Flat affect Overall Cognitive Status: Impaired/Different from baseline Area of Impairment: Orientation Orientation Level: Disoriented to;Time Current Attention Level: Selective Memory: Decreased recall of precautions;Decreased short-term memory    Extremity/Trunk Assessment Upper Extremity Assessment Upper Extremity Assessment: Generalized weakness Lower Extremity Assessment  Lower Extremity Assessment: Defer to PT evaluation Cervical / Trunk Assessment Cervical / Trunk Assessment: Kyphotic     Mobility Transfers Transfers: Sit to Stand;Stand to Sit Sit to Stand: From chair/3-in-1;Without upper extremity assist;3: Mod assist Stand to Sit: 3: Mod assist Details for Transfer Assistance: cues needed for hand placement and for sternal precautions to place hands on knees      Exercise     Balance Balance Balance Assessed: Yes Static Sitting Balance Static Sitting - Balance Support: Feet supported;Bilateral upper extremity supported Static Sitting - Level of Assistance: 5: Stand by assistance   End of Session OT - End of Session Equipment Utilized During Treatment: Gait belt Activity Tolerance: Patient limited by fatigue;Patient limited by lethargy Patient left: in chair;with call bell/phone within reach Nurse Communication: Mobility status  GO     Chevon Fomby,HILLARY 06/02/2013, 4:23 PM [facility], OTR/L  [phone] 06/02/2013

## 2013-06-02 NOTE — Progress Notes (Signed)
Pt HR back in SR @ 1610; HR now 70's; will cont. To monitor.

## 2013-06-02 NOTE — Progress Notes (Signed)
[facility] ROUNDING NOTE  Subjective:  Having issues with dizziness and nausea when he gets up, despite no orthostatic hypotension Has been medicated with zofran Wife said had a difficult night because could not get comfortable (probably why he is so sleepy now in HD)  Objective Vital signs in last 24 hours: Filed Vitals:   06/01/13 1429 06/01/13 2029 06/02/13 0236 06/02/13 0444  BP: 120/64 119/53  132/57  Pulse: 67 67  73  Temp: 97.5 F (36.4 C) 97.8 F (36.6 C)  98.3 F (36.8 C)  TempSrc: Oral Oral  Oral  Resp: 18   18  Height:      Weight:   102.3 kg (225 lb 8.5 oz)   SpO2: 94% 95%  98%   Weight change: -0.9 kg (-1 lb 15.8 oz)  Intake/Output Summary (Last 24 hours) at 06/02/13 0800 Last data filed at 06/02/13 0656  Gross per 24 hour  Intake    360 ml  Output    120 ml  Net    240 ml   Physical Exam:  BP 132/57  Pulse 73  Temp(Src) 98.3 F (36.8 C) (Oral)  Resp 18  Ht 6\' 1"  (1.854 m)  Wt 102.3 kg (225 lb 8.5 oz)  BMI 29.76 kg/m2  SpO2 98% Sleepy but awakens and is appropriate Not nauseous right now Lungs with basilar crackles Sternotomy scar healing nicely Abd soft and not tender Leg incisions healing Trace edema LE's  Labs: Basic Metabolic Panel:  Recent Labs Lab 05/27/13 0300 05/28/13 0230 05/29/13 0420 05/30/13 0535 05/31/13 0425 06/01/13 0500 06/02/13 0518  NA 139 132* 136 134* 136 135 136  K 4.3 4.7 4.3 4.7 3.9 4.1 4.5  CL 101 95* 97 95* 97 97 98  CO2 28 25 29 23 30 24 22   GLUCOSE 105* 94 100* 99 93 83 91  BUN 34* 52* 37* 62* 37* 56* 72*  CREATININE 3.64* 5.55* 4.55* 6.68* 4.89* 6.81* 9.13*  CALCIUM 8.0* 8.0* 8.0* 8.1* 8.1* 8.1* 8.5  PHOS 4.2 6.9* 6.0* 6.0* 4.2 5.2* 6.2*   Results for TREMAIN, RUCINSKI (MRN [id number]) as of 06/01/2013 07:32   Ref. Range  05/31/2013 10:33   Iron  Latest Range: 42-135 ug/dL  26 (L)   UIBC  Latest Range: 125-400 ug/dL  119   TIBC  Latest Range: 215-435 ug/dL  147   Saturation Ratios  Latest  Range: 20-55 %  10 (L)     Recent Labs Lab 05/31/13 0425 06/01/13 0500 06/02/13 0518  ALBUMIN 2.0* 1.8* 1.9*   Recent Labs Lab 05/30/13 0535 05/31/13 0425 06/01/13 0500 06/02/13 0518  WBC 12.5* 12.0* 10.3 8.3  HGB 8.4* 8.2* 8.2* 7.7*  HCT 26.7* 26.2* 26.7* 26.4*  MCV 80.9 84.0 84.0 85.2  PLT 186 231 251 282   Lab Results  Component Value Date   INR 2.66* 06/02/2013   INR 2.40* 06/01/2013   INR 2.22* 05/31/2013    Studies/Results: No results found. Medications:   . amiodarone  200 mg Oral BID  . atorvastatin  20 mg Oral q1800  . calcium acetate  667 mg Oral TID WC  . carvedilol  1.56 mg Oral BID WC  . darbepoetin (ARANESP) injection - NON-DIALYSIS  60 mcg Subcutaneous Q Tue-1800  . feeding supplement  1 Container Oral TID BM  . ferric gluconate (FERRLECIT/NULECIT) IV  125 mg Intravenous Q T,Th,Sa-HD  . levothyroxine  100 mcg Oral QAC breakfast  . multivitamin  1 tablet Oral Daily  .  pantoprazole  40 mg Oral Daily  . sodium chloride  3 mL Intravenous Q12H  . warfarin  2.5 mg Oral q1800  . Warfarin - Physician Dosing Inpatient   Does not apply q1800  sodium chloride, acetaminophen, albuterol, food thickener, guaiFENesin-dextromethorphan, haloperidol lactate, HYDROcodone-acetaminophen, meclizine, ondansetron (ZOFRAN) IV, promethazine, sodium chloride  I  have reviewed scheduled and prn medications.   ASSESSMENT/RECOMMENDATIONS   79yo WM with baseline CKD (1.7-1.7) with oligoanuric ischemic ATN post CP/cardiogenic shock, cath, CABG (7/9), requiring renal replacement therapy since 7/17 with CRRT, transitioned to intermittent HD on 7/21, now with TDC placed 7/22. On TTS schedule with hopes of recovery of function.   AKI on CKD Dialysis dependent since 7/17.  No evidence recovery as of yet  Minimal UOP.  Too early to call ESRD.  Continue TTS schedule until evidence of recovery.  Inpt rehab feels LTAC better choice because of the renal failure   (LTAC would probably be  the best for now as he will not be able to get outpt HD in a SNF as he cannot be labeled ESRD yet)   SP CABG Per TCTS Needs rehab On amio and coumadin   Anemia  On aranesp 60 QTues.  Tsat low.  Replete with HD X 10 doses of ferrlecit (to start 7/29)   A fib, now SR.  Amio/coumadin is on hold d/t rising INR.   Deconditioning  ? LTAC (cannot go to SNF and dialyze as outpt b/c cannot be deemed ESRD at this time - too early).   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

## 2013-06-02 NOTE — Discharge Summary (Signed)
Physician Discharge Summary  Patient ID: [name] MRN: [id number] DOB/AGE: 1934/08/09 79 y.o.  Admit date: 05/12/2013 Discharge date: 06/02/2013  Admission Diagnoses:  Patient Active Problem List   Diagnosis Date Noted  . Acute respiratory failure 05/13/2013  . Acute on chronic combined systolic and diastolic CHF, NYHA class 4 05/13/2013  . HCAP (healthcare-associated pneumonia) 05/13/2013  . NSTEMI (non-ST elevated myocardial infarction) 05/13/2013   Discharge Diagnoses:   Patient Active Problem List   Diagnosis Date Noted  . S/P CABG x 4 06/02/2013  . Acute renal failure 06/02/2013  . Acute respiratory failure 05/13/2013  . Acute on chronic combined systolic and diastolic CHF, NYHA class 4 05/13/2013  . HCAP (healthcare-associated pneumonia) 05/13/2013  . NSTEMI (non-ST elevated myocardial infarction) 05/13/2013   Discharged Condition: good  History of Present Illness:   [name] is a 79 yo white male with no prior cardiac history but presented to [facility] on 05/12/2013 with a week of intermittent chest pain culminating in a 10/10 episode early yesterday morning. He ruled in for non-STEMI with troponin of 6. He was found to be in A-flutter with variable conduction. His pain improved with NTG and morphine but did not resolve and he was transferred to [facility] for further care. 2D echo showed EF of 45% with no valvular abnormalities. He was apparently stable overnight last night and early this am developed sudden pulmonary edema requiring intubation. He was taken to the cath lab and was in cardiogenic shock. Cath showed 90% distal LM and severe 3 vessel coronary disease with RCA occlusion. IABP was not inserted due to a 5 cm AAA. He was started on levophed and taken back to the CCU in hemodynamically stable condition with SBP 120. CXR now continues to show some pulmonary edema. His ABG on 100% shows a PO2 of 60 and PCO2 60. He has a hx of chronic kidney disease with creat 1.77  this am. He was also on coumadin for a hx of left leg DVT and atrial flutter. INR was 3.3 when admitted and he was given FFP and Vit K. Troponin this am was 8.9. Cardiac surgery was consulted and the patient was evaluated by Dr. Laneta Simmers on 05/13/2013.  At that time it was felt the patient would benefit from Emergent Coronary Bypass Grafting procedure.   Hospital Course:   The patient was taken to the operating room and underwent CABG x 4 utilizing LIMA to LAD, SVG to PDA, and Sequential SVG to Ramus Intermediate and OM.  The patient tolerated the procedure and was taken to the ICU in critical but stable condition.  During his stay in the ICU the patient the patient's renal status worsened to Acute renal failure.  He did have some Urinary output and was monitored closely by Nephrology.  He was weaned off his cardiac support drips as tolerated. Critical care was consulted for assistance in ventilator management.  The patient was successfully weaned and extubated on POD #3.  The patient developed Rapid Atrial Fibrillation with RVR.  He was treated with IV Amiodarone with successful conversion to NSR.  His chest tubes and arterial lines were removed without difficulty.  The patient developed a post operative Ileus.  The patients renal status continued to decline.  His creatinine continued to rise with slow decline in urinary output despite use of Lasix.  Due to this he progressed to Pulmonary edema.  Nephrology consult was obtained who was initially hopeful kidney function would recover.  However, despite Lasix challenge  patients renal status continued to decline prompting initiation of Hemodialysis.  Patient was also found to be Hypothyroid with continued episodes of Atrial Fibrillation and he was initiated on Synthroid.  Patient developed dysphagia prompting evaluation by Speech and Swallow.  They found the patient to be at high risk for aspiration.  Further study with Fibro-optic scope recommend the patient to be  low risk for aspiration and he was placed on a Dysphagia 3 diet.  The patient developed productive cough.  Sputum culture was obtained and revealed Enterobacter.  The patient was treated with IV Maxipime.  The patient continued to progress slowly.  He was participating with PT and ambulating around the SICU.  Once medically stable he was transferred to the step down unit in stable condition.  The patient continued to have episodes of Atrial Fibrillation.  He was therefore placed on Coumadin.  He is currently maintaining NSR and his pacing wires have been removed.  He continues to have Renal Failure requiring dialysis every Tuesday, Thursday, and Saturday.  Nephrology remains hopeful his kidney function will recover.  He has continued to participate with Speech and Swallow.  He has progressed to a regular diet.  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  The patient continues to be deconditioned requiring additional rehabilitation at discharge.  He is tolerating a regular diet.  His INR is therapeutic at 2.66 and we will continue his Coumadin at 2.5 mg daily.  Please keep INR 2.0-3.0.  The patient developed dizziness and nausea with standing and ambulation.  This is likely due to use of Amiodarone which was discontinued.  The patient is medically stable at this time.  Due to his acute renal failure he will require dialysis for now.  We will arrange placement at an inpatient rehab facility hopefully for the next 24-48 hours.  He will follow up with Dr. Laneta Simmers in 3 weeks with a CXR prior to his appointment.  He will also need to follow up with [facility] in 2-4 weeks.                 Consults: cardiology, pulmonary/intensive care, nephrology, rehabilitation medicine and vascular surgery  Treatments: dialysis: Hemodialysis and surgery:   1. Median Sternotomy 2. Extracorporeal circulation       3. Coronary artery bypass grafting x 4  Left internal mammary graft to the LAD  Seq SVG to  intermediate and OM  SVG to PDA.       4. Endoscopic vein harvest from the right and left legs  Procedure: Ultrasound-guided insertion of Diatek catheter  Disposition: LTAC    Discharge Medications:     Medication List    STOP taking these medications       aspirin EC 81 MG tablet     diltiazem 360 MG 24 hr capsule  Commonly known as:  TIAZAC      TAKE these medications       albuterol (5 MG/ML) 0.5% nebulizer solution  Commonly known as:  PROVENTIL  Take 0.5 mLs (2.5 mg total) by nebulization every 6 (six) hours as needed for wheezing or shortness of breath.     atorvastatin 20 MG tablet  Commonly known as:  LIPITOR  Take 1 tablet (20 mg total) by mouth daily at 6 PM.     calcium acetate 667 MG capsule  Commonly known as:  PHOSLO  Take 1 capsule (667 mg total) by mouth 3 (three) times daily with meals.  carvedilol 3.125 MG tablet  Commonly known as:  COREG  Take 1 tablet (3.125 mg total) by mouth 2 (two) times daily with a meal.     darbepoetin 60 MCG/0.3ML Soln  Commonly known as:  ARANESP  Inject 0.3 mLs (60 mcg total) into the skin every Tuesday at 6 PM.     feeding supplement Liqd  Take 1 Container by mouth 3 (three) times daily between meals.     guaiFENesin-dextromethorphan 100-10 MG/5ML syrup  Commonly known as:  ROBITUSSIN DM  Take 15 mLs by mouth every 4 (four) hours as needed for cough.     HYDROcodone-acetaminophen 5-325 MG per tablet  Commonly known as:  NORCO/VICODIN  Take 1-2 tablets by mouth every 4 (four) hours as needed.     levothyroxine 100 MCG tablet  Commonly known as:  SYNTHROID, LEVOTHROID  Take 1 tablet (100 mcg total) by mouth daily before breakfast.     multivitamin Tabs tablet  Take 1 tablet by mouth daily.     ondansetron 4 MG tablet  Commonly known as:  ZOFRAN  Take 1 tablet (4 mg total) by mouth every 8 (eight) hours as needed for nausea.     simvastatin 40 MG tablet  Commonly known as:  ZOCOR  Take 40 mg by mouth  every evening.     sodium chloride 0.9 % SOLN 100 mL with ferric gluconate 12.5 MG/ML SOLN 125 mg  Inject 125 mg into the vein Every Tuesday,Thursday,and Saturday with dialysis.     warfarin 2.5 MG tablet  Commonly known as:  COUMADIN  Take 1 tablet (2.5 mg total) by mouth daily at 6 PM.          The patient has been discharged on:   1.Beta Blocker:  Yes [x  ]                              No   [   ]                              If No, reason:  2.Ace Inhibitor/ARB: Yes [   ]                                     No  [  x  ]                                     If No, reason: Renal Failure  3.Statin:   Yes [ x  ]                  No  [   ]                  If No, reason:  4.Ecasa:  Yes  [   ]                  No   [ x  ]                  If No, reason: on Coumadin     Signed: BARRETT, ERIN 06/02/2013, 8:25 AM

## 2013-06-02 NOTE — Care Management Note (Signed)
    Page 1 of 2   06/03/2013     4:33:00 PM   CARE MANAGEMENT NOTE 06/03/2013  Patient:  [name], [name]   Account Number:  [id number]  Date Initiated:  05/12/2013  Documentation initiated by:  Junius Creamer  Subjective/Objective Assessment:   adm w mi     Action/Plan:   lives w wife, pcp dr Herminio Commons vyas   Anticipated DC Date:  06/04/2013   Anticipated DC Plan:  LONG TERM ACUTE CARE (LTAC)      DC Planning Services  CM consult      Choice offered to / List presented to:             Status of service:  Completed, signed off Medicare Important Message given?   (If response is "NO", the following Medicare IM given date fields will be blank) Date Medicare IM given:   Date Additional Medicare IM given:    Discharge Disposition:  IP REHAB FACILITY  Per UR Regulation:  Reviewed for med. necessity/level of care/duration of stay  If discussed at Long Length of Stay Meetings, dates discussed:   05/19/2013  05/21/2013  05/26/2013  05/28/2013  06/02/2013    Comments:  Contact:  Aven Christen, spouse  [phone]  06/03/13 Jearldine Cassady,RN,BSN [phone] CIR HAS RECONSIDERED AND IS WILLING TO TAKE PT TO IP REHAB TODAY.  MD AGREEABLE WITH DC.  PT/FAMILY HAPPY WITH THIS DISPOSITION.  NOTIFIED [facility] OF PLANS.  06/02/13 Cleve Paolillo,RN,BSN [phone] [facility] HAS DENIED ADMISSION TO THEIR FACILITY. [facility] WOULD BE ABLE TO ADMIT PT, BUT DOES NOT HAVE HD BED AVAILABLE, AT PRESENT.  WILL PUT MR. Neldon Labella ON LIST FOR NEXT AVAILABLE  DIALYSIS BED.  WILL ALSO MAKE SURE THAT INPT REHAB IS DEFINITELY NOT AN OPTION.  LEFT MESSAGE WITH IP REHAB LIASION BARBARA BOYETTE.  06/01/13 Kwynn Schlotter,RN,BSN [phone] IP REHAB HESITANT TO TAKE PT, AS HE MAY NEED LONGER STAY THAN THEY CAN PROVIDE.  LTAC MAY BE MORE APPROPRIATE DISPOSITION.  WILL FOLLOW UP WITH [facility] AND [facility] ON BED AVAILABILITY AND ELIGIBILITY.  PT/FAM PREFERENCE IS [facility].   05/29/13 1032 Henrietta Mayo RN MSN BSN  CCM Discussed rehab options - LTAC vs CIR.  Spouse will be very limited in her ability to help but grandsons will be available. 1439 Plan is d/c to CIR when medically stable.  05/26/13 1126 Henrietta Mayo RN MSN BSN CCM Nephrology attempting intermittent HD, perm cath scheduled for tomorrow.  Remains on amiodarone gtt.  05/22/13 1119 Henrietta Mayo RN MSN BSN CCM Pt with creat >5, now on CRRT.  Swallow eval pending.  Per spouse, sons and grandson will be able to assist when pt medically stable to d/c.  PT recommended home health, will continue to monitor for possible rehab needs before d/c home.  05/18/13 1122 Henrietta Mayo RN MSN BSN CCM Met @ bedside with pt, spouse, and son.  Spouse is mostly w/c bound, states pt was independent and very active PTA. She has cane, walker, BSC @ home that pt can use if needed. Per staff, pt has ileus and has been requesting pain meds q 2 hrs, is over-sedated.  Staff now trying to limit meds and encourage ambulation - ambulated 150 ft this a.m.

## 2013-06-02 NOTE — Progress Notes (Signed)
PT Cancellation Note  Patient Details Name: [name] MRN: [id number] DOB: 05-Aug-1934   Cancelled Treatment:    Reason Eval/Treat Not Completed: [name]/unavailable.  Pt receiving hemodialysis this am.  Will return tomorrow and continue PT as able.  Thanks.   INGOLD,[name] 06/02/2013, 10:00 AM [facility] [phone] (pager)

## 2013-06-02 NOTE — Progress Notes (Addendum)
      [address]       [name] [address]             [phone]      7 Days Post-Op Procedure(s) (LRB): INSERTION OF DIALYSIS CATHETER (N/A)  Subjective:  [name] had rough night last night.  Per patient's wife he was very restless and unable to get comfortable.  The patient continues to have dizziness and nausea with ambulation.  Orthostatic pressure was checked and was fine by cardiac rehab.    Objective: Vital signs in last 24 hours: Temp:  [97.5 F (36.4 C)-98.3 F (36.8 C)] 98.3 F (36.8 C) (07/29 0444) Pulse Rate:  [67-73] 73 (07/29 0444) Cardiac Rhythm:  [-] Normal sinus rhythm (07/28 1940) Resp:  [18] 18 (07/29 0444) BP: (107-132)/(50-64) 132/57 mmHg (07/29 0444) SpO2:  [94 %-98 %] 98 % (07/29 0444) Weight:  [225 lb 8.5 oz (102.3 kg)] 225 lb 8.5 oz (102.3 kg) (07/29 0236)  Intake/Output from previous day: 07/28 0701 - 07/29 0700 In: 360 [P.O.:360] Out: 120 [Urine:120]  General appearance: alert, cooperative and no distress Heart: regular rate and rhythm Lungs: clear to auscultation bilaterally Abdomen: soft, non-tender; bowel sounds normal; no masses,  no organomegaly Extremities: edema trace Wound: clean and dry  Lab Results:  Recent Labs  06/01/13 0500 06/02/13 0518  WBC 10.3 8.3  HGB 8.2* 7.7*  HCT 26.7* 26.4*  PLT 251 282   BMET:  Recent Labs  06/01/13 0500 06/02/13 0518  NA 135 136  K 4.1 4.5  CL 97 98  CO2 24 22  GLUCOSE 83 91  BUN 56* 72*  CREATININE 6.81* 9.13*  CALCIUM 8.1* 8.5    PT/INR:  Recent Labs  06/02/13 0518  LABPROT 27.4*  INR 2.66*   ABG    Component Value Date/Time   PHART 7.343* 05/16/2013 0409   HCO3 20.3 05/16/2013 0409   TCO2 21 05/16/2013 0409   ACIDBASEDEF 5.0* 05/16/2013 0409   O2SAT 90.0 05/16/2013 0409   CBG (last 3)  No results found for this basename: GLUCAP,  in the last 72 hours  Assessment/Plan: S/P Procedure(s) (LRB): INSERTION OF DIALYSIS CATHETER (N/A)  1. CV- NSR  rate and pressure controlled- on Amiodarone 200 mg BID and Coreg 2. Pulm- no acute issues, encouraged use of IS 3. INR 2.66- continues to rise despite holding dose, will d/c EPW today before INR gets any higher 4. Dizziness and Nausea with ambulation- possibly Vertigo, can add meclizine prn 5. Renal- Acute Renal Failure on CKD- currently requiring HD, nephrology following 6. Deconditioning- continue PT/OT 7. Dispo- patient stable, will d/c EPW after dialysis today, may benefit from discontinuation of Amiodarone with continued complaints of nausea, plan for d/c once LTAC approved   LOS: 21 days    [name] 06/02/2013   Chart reviewed, patient examined, agree with above. I would discontinue amio since it may be causing nausea and can cause dizziness.

## 2013-06-02 NOTE — Progress Notes (Signed)
Pt back in room s/p HD; pt given PO Amio at this time; HR back in a-fib at end of HD treatment; per PA will not pull EPW today; HR a-fib 110-120's at this time; family at bedside; pt lethargic, but easily aroused; will cont. To monitor.

## 2013-06-02 NOTE — Progress Notes (Signed)
Clinical Social Worker received referral for SNF placement. CSW reviewed chart and noticed plan is for patient to go to LTAC. CSW consulted with CM. Please re consult if social work is needed.  [name], MSW, [name] [phone]

## 2013-06-03 ENCOUNTER — Inpatient Hospital Stay (HOSPITAL_COMMUNITY)
Admission: RE | Admit: 2013-06-03 | Discharge: 2013-06-12 | DRG: 945 | Disposition: A | Discharge status: Other Acute Inpatient Hospital | Payer: Medicare Other | Source: Intra-hospital | Attending: Physical Medicine & Rehabilitation | Admitting: Physical Medicine & Rehabilitation

## 2013-06-03 ENCOUNTER — Encounter (HOSPITAL_COMMUNITY): Payer: Self-pay | Admitting: Physical Medicine and Rehabilitation

## 2013-06-03 ENCOUNTER — Encounter (HOSPITAL_COMMUNITY): Payer: Self-pay | Admitting: *Deleted

## 2013-06-03 DIAGNOSIS — F3289 Other specified depressive episodes: Secondary | ICD-10-CM | POA: Diagnosis present

## 2013-06-03 DIAGNOSIS — N186 End stage renal disease: Secondary | ICD-10-CM | POA: Diagnosis present

## 2013-06-03 DIAGNOSIS — F329 Major depressive disorder, single episode, unspecified: Secondary | ICD-10-CM | POA: Diagnosis present

## 2013-06-03 DIAGNOSIS — R5381 Other malaise: Secondary | ICD-10-CM | POA: Diagnosis present

## 2013-06-03 DIAGNOSIS — H9190 Unspecified hearing loss, unspecified ear: Secondary | ICD-10-CM | POA: Diagnosis present

## 2013-06-03 DIAGNOSIS — Z87891 Personal history of nicotine dependence: Secondary | ICD-10-CM

## 2013-06-03 DIAGNOSIS — K5900 Constipation, unspecified: Secondary | ICD-10-CM | POA: Diagnosis present

## 2013-06-03 DIAGNOSIS — Z5189 Encounter for other specified aftercare: Principal | ICD-10-CM

## 2013-06-03 DIAGNOSIS — N179 Acute kidney failure, unspecified: Secondary | ICD-10-CM | POA: Diagnosis present

## 2013-06-03 DIAGNOSIS — I2510 Atherosclerotic heart disease of native coronary artery without angina pectoris: Secondary | ICD-10-CM

## 2013-06-03 DIAGNOSIS — Z951 Presence of aortocoronary bypass graft: Secondary | ICD-10-CM

## 2013-06-03 DIAGNOSIS — I5043 Acute on chronic combined systolic (congestive) and diastolic (congestive) heart failure: Secondary | ICD-10-CM | POA: Diagnosis present

## 2013-06-03 DIAGNOSIS — I214 Non-ST elevation (NSTEMI) myocardial infarction: Secondary | ICD-10-CM | POA: Diagnosis present

## 2013-06-03 DIAGNOSIS — E039 Hypothyroidism, unspecified: Secondary | ICD-10-CM | POA: Diagnosis present

## 2013-06-03 DIAGNOSIS — Z992 Dependence on renal dialysis: Secondary | ICD-10-CM

## 2013-06-03 DIAGNOSIS — N185 Chronic kidney disease, stage 5: Secondary | ICD-10-CM

## 2013-06-03 DIAGNOSIS — I480 Paroxysmal atrial fibrillation: Secondary | ICD-10-CM | POA: Diagnosis present

## 2013-06-03 DIAGNOSIS — I120 Hypertensive chronic kidney disease with stage 5 chronic kidney disease or end stage renal disease: Secondary | ICD-10-CM | POA: Diagnosis present

## 2013-06-03 LAB — RENAL FUNCTION PANEL
BUN: 35 mg/dL — ABNORMAL HIGH (ref 6–23)
CO2: 26 mEq/L (ref 19–32)
Calcium: 8.6 mg/dL (ref 8.4–10.5)
Creatinine, Ser: 6.06 mg/dL — ABNORMAL HIGH (ref 0.50–1.35)
Glucose, Bld: 81 mg/dL (ref 70–99)
Phosphorus: 4.2 mg/dL (ref 2.3–4.6)

## 2013-06-03 LAB — CBC
HCT: 28.3 % — ABNORMAL LOW (ref 39.0–52.0)
Hemoglobin: 8.4 g/dL — ABNORMAL LOW (ref 13.0–17.0)
MCH: 25.6 pg — ABNORMAL LOW (ref 26.0–34.0)
MCHC: 29.7 g/dL — ABNORMAL LOW (ref 30.0–36.0)
MCV: 86.3 fL (ref 78.0–100.0)

## 2013-06-03 LAB — GLUCOSE, CAPILLARY
Glucose-Capillary: 78 mg/dL (ref 70–99)
Glucose-Capillary: 103 mg/dL — ABNORMAL HIGH (ref 70–99)

## 2013-06-03 MED ORDER — ONDANSETRON HCL 4 MG PO TABS: 4.0000 mg | ORAL_TABLET | Freq: Three times a day (TID) | ORAL | Status: DC | PRN

## 2013-06-03 MED ORDER — DIPHENHYDRAMINE HCL 12.5 MG/5ML PO ELIX: 12.5000 mg | ORAL_SOLUTION | Freq: Four times a day (QID) | ORAL | Status: DC | PRN

## 2013-06-03 MED ORDER — ONDANSETRON HCL 4 MG/2ML IJ SOLN
4.0000 mg | Freq: Four times a day (QID) | INTRAMUSCULAR | Status: DC | PRN
  Administered 2013-06-09: 4 mg via INTRAVENOUS

## 2013-06-03 MED ORDER — ATORVASTATIN CALCIUM 20 MG PO TABS: 20.0000 mg | ORAL_TABLET | Freq: Every day | ORAL | Status: DC

## 2013-06-03 MED ORDER — WARFARIN SODIUM 2.5 MG PO TABS: 2.5000 mg | ORAL_TABLET | Freq: Every day | ORAL | Status: DC

## 2013-06-03 MED ORDER — LEVOTHYROXINE SODIUM 100 MCG PO TABS: 100.0000 ug | ORAL_TABLET | Freq: Every day | ORAL | Status: DC

## 2013-06-03 MED ORDER — GUAIFENESIN-DM 100-10 MG/5ML PO SYRP: 15.0000 mL | ORAL_SOLUTION | ORAL | Status: DC | PRN

## 2013-06-03 MED ORDER — BOOST / RESOURCE BREEZE PO LIQD
1.0000 | Freq: Three times a day (TID) | ORAL | Status: DC
  Administered 2013-06-03 – 2013-06-08 (×6): 1 via ORAL

## 2013-06-03 MED ORDER — WARFARIN - PHARMACIST DOSING INPATIENT
Freq: Every day | Status: DC
  Administered 2013-06-05: 18:00:00

## 2013-06-03 MED ORDER — SORBITOL 70 % SOLN
45.0000 mL | Freq: Once | Status: AC
  Administered 2013-06-03: 45 mL via ORAL
  Filled 2013-06-03: qty 60

## 2013-06-03 MED ORDER — CARVEDILOL 3.125 MG PO TABS: 3.1250 mg | ORAL_TABLET | Freq: Two times a day (BID) | ORAL | Status: DC

## 2013-06-03 MED ORDER — CARVEDILOL 3.125 MG PO TABS
3.1250 mg | ORAL_TABLET | Freq: Two times a day (BID) | ORAL | Status: DC
  Filled 2013-06-03 (×2): qty 1

## 2013-06-03 MED ORDER — CALCIUM ACETATE 667 MG PO CAPS
667.0000 mg | ORAL_CAPSULE | Freq: Three times a day (TID) | ORAL | Status: DC
  Administered 2013-06-03 – 2013-06-12 (×18): 667 mg via ORAL
  Filled 2013-06-03 (×29): qty 1

## 2013-06-03 MED ORDER — HYDROCODONE-ACETAMINOPHEN 5-325 MG PO TABS
1.0000 | ORAL_TABLET | ORAL | Status: DC | PRN
  Administered 2013-06-04 – 2013-06-05 (×2): 2 via ORAL
  Administered 2013-06-05 – 2013-06-06 (×2): 1 via ORAL
  Administered 2013-06-08 (×2): 2 via ORAL
  Administered 2013-06-08: 1 via ORAL
  Administered 2013-06-09: 2 via ORAL
  Administered 2013-06-12: 1 via ORAL
  Filled 2013-06-03 (×2): qty 1
  Filled 2013-06-03: qty 2
  Filled 2013-06-03 (×2): qty 1
  Filled 2013-06-03 (×5): qty 2

## 2013-06-03 MED ORDER — ALUMINUM HYDROXIDE GEL 320 MG/5ML PO SUSP: 15.0000 mL | Freq: Four times a day (QID) | ORAL | Status: DC | PRN

## 2013-06-03 MED ORDER — WARFARIN SODIUM 2 MG PO TABS
2.0000 mg | ORAL_TABLET | Freq: Once | ORAL | Status: AC
  Administered 2013-06-03: 2 mg via ORAL
  Filled 2013-06-03: qty 1

## 2013-06-03 MED ORDER — SENNOSIDES-DOCUSATE SODIUM 8.6-50 MG PO TABS
2.0000 | ORAL_TABLET | Freq: Every day | ORAL | Status: DC
  Administered 2013-06-03 – 2013-06-11 (×7): 2 via ORAL
  Filled 2013-06-03 (×6): qty 2

## 2013-06-03 MED ORDER — DARBEPOETIN ALFA-POLYSORBATE 60 MCG/0.3ML IJ SOLN
60.0000 ug | INTRAMUSCULAR | Status: DC
  Administered 2013-06-10: 60 ug via SUBCUTANEOUS
  Filled 2013-06-03 (×2): qty 0.3

## 2013-06-03 MED ORDER — LEVOTHYROXINE SODIUM 100 MCG PO TABS
100.0000 ug | ORAL_TABLET | Freq: Every day | ORAL | Status: DC
  Administered 2013-06-04 – 2013-06-12 (×9): 100 ug via ORAL
  Filled 2013-06-03 (×10): qty 1

## 2013-06-03 MED ORDER — SODIUM CHLORIDE 0.9 % IV SOLN
125.0000 mg | INTRAVENOUS | Status: DC
  Administered 2013-06-04 – 2013-06-11 (×3): 125 mg via INTRAVENOUS
  Filled 2013-06-03 (×7): qty 10

## 2013-06-03 MED ORDER — HYDROCODONE-ACETAMINOPHEN 5-325 MG PO TABS: 1.0000 | ORAL_TABLET | ORAL | Status: DC | PRN

## 2013-06-03 MED ORDER — SODIUM CHLORIDE 0.9 % IJ SOLN: 3.0000 mL | Freq: Two times a day (BID) | INTRAMUSCULAR | Status: DC

## 2013-06-03 MED ORDER — SODIUM CHLORIDE 0.9 % IJ SOLN: 3.0000 mL | INTRAMUSCULAR | Status: DC | PRN

## 2013-06-03 MED ORDER — PANTOPRAZOLE SODIUM 40 MG PO TBEC
40.0000 mg | DELAYED_RELEASE_TABLET | Freq: Every day | ORAL | Status: DC
  Administered 2013-06-04 – 2013-06-12 (×8): 40 mg via ORAL
  Filled 2013-06-03 (×7): qty 1

## 2013-06-03 MED ORDER — ATORVASTATIN CALCIUM 20 MG PO TABS
20.0000 mg | ORAL_TABLET | Freq: Every day | ORAL | Status: DC
  Administered 2013-06-03 – 2013-06-11 (×9): 20 mg via ORAL
  Filled 2013-06-03 (×10): qty 1

## 2013-06-03 MED ORDER — ACETAMINOPHEN 325 MG PO TABS
325.0000 mg | ORAL_TABLET | ORAL | Status: DC | PRN
  Administered 2013-06-03: 650 mg via ORAL
  Filled 2013-06-03: qty 2

## 2013-06-03 MED ORDER — RENA-VITE PO TABS: 1.0000 | ORAL_TABLET | Freq: Every day | ORAL | Status: DC

## 2013-06-03 MED ORDER — ONDANSETRON HCL 4 MG PO TABS
4.0000 mg | ORAL_TABLET | Freq: Four times a day (QID) | ORAL | Status: DC | PRN
  Administered 2013-06-05 – 2013-06-10 (×7): 4 mg via ORAL
  Filled 2013-06-03 (×8): qty 1

## 2013-06-03 MED ORDER — INSULIN ASPART 100 UNIT/ML ~~LOC~~ SOLN: 0.0000 [IU] | Freq: Three times a day (TID) | SUBCUTANEOUS | Status: DC

## 2013-06-03 MED ORDER — INSULIN ASPART 100 UNIT/ML ~~LOC~~ SOLN: 0.0000 [IU] | Freq: Every day | SUBCUTANEOUS | Status: DC

## 2013-06-03 MED ORDER — FLEET ENEMA 7-19 GM/118ML RE ENEM: 1.0000 | ENEMA | Freq: Once | RECTAL | Status: AC | PRN

## 2013-06-03 MED ORDER — BOOST / RESOURCE BREEZE PO LIQD: 1.0000 | Freq: Three times a day (TID) | ORAL | Status: DC

## 2013-06-03 MED ORDER — ALBUTEROL SULFATE (5 MG/ML) 0.5% IN NEBU: 2.5000 mg | INHALATION_SOLUTION | Freq: Four times a day (QID) | RESPIRATORY_TRACT | Status: DC | PRN

## 2013-06-03 MED ORDER — DARBEPOETIN ALFA-POLYSORBATE 60 MCG/0.3ML IJ SOLN: 60.0000 ug | INTRAMUSCULAR | Status: DC

## 2013-06-03 MED ORDER — CALCIUM ACETATE 667 MG PO CAPS: 667.0000 mg | ORAL_CAPSULE | Freq: Three times a day (TID) | ORAL | Status: DC

## 2013-06-03 MED ORDER — TRAZODONE HCL 50 MG PO TABS: 25.0000 mg | ORAL_TABLET | Freq: Every evening | ORAL | Status: DC | PRN

## 2013-06-03 MED ORDER — CARVEDILOL 3.125 MG PO TABS
3.1250 mg | ORAL_TABLET | Freq: Two times a day (BID) | ORAL | Status: DC
  Administered 2013-06-03 – 2013-06-05 (×4): 3.125 mg via ORAL
  Filled 2013-06-03 (×8): qty 1

## 2013-06-03 MED ORDER — SODIUM CHLORIDE 0.9 % IV SOLN: 125.0000 mg | INTRAVENOUS | Status: DC

## 2013-06-03 MED ORDER — BISACODYL 10 MG RE SUPP: 10.0000 mg | Freq: Every day | RECTAL | Status: DC | PRN

## 2013-06-03 MED ORDER — RENA-VITE PO TABS
1.0000 | ORAL_TABLET | Freq: Every day | ORAL | Status: DC
  Administered 2013-06-04 – 2013-06-12 (×8): 1 via ORAL
  Filled 2013-06-03 (×10): qty 1

## 2013-06-03 NOTE — Progress Notes (Signed)
[id number] Helped PT with positioning pt for vestibular treatment. Reviewed sternal precautions, IS and permission given to refer to [facility] after pt recovers. Since pt on dialysis did not give diet as he needs renal diet. Encouraged walking as tolerated at home when stronger. Pt to go to Rehab today.Luetta Nutting RN BSN

## 2013-06-03 NOTE — H&P (Signed)
Physical Medicine and Rehabilitation Admission H&P      CC: Deconditioning.    HPI: [name] is a 79 y.o. male with PMhx significant for HTN, RA, hypothyroidism along with diffuse vascular dz to include CAD, PAD, AAA and common iliac artery dissection. He also has hx of atrial flutter and CKD --Cr 1.77 when admitted on 7/9 with CP and recurrent a flutter with positive troponin. His hospital course progressed quickly with him needing cardiac cath on 7/9 and eventually semi emergent CABG that same day. Post op with hypotension and declining renal function with decrease in UOP. Noted to be volume overloaded as well as somnolence on 07/14. Renal consulted and patient failed diuretic challenge requiring dialysis support. Started on IV heparin for persistent A Fib.ST evaluation done 07/18 due to concerns of dysphagia and FEES done mild oropharyngeal stage dysphagia--D3, nectar liquids recommended due to intermittent lethargy and deconditioning--advanced to thins. HD ongoing but limited due to low BP on TTS schedule with hopes of recovery in the future.  IJ placed on 07/22 as he due to lack of UOP.   He started developing lethargy as well as dizziness and nausea with mobility for the past few days.  No orthostatic hypotension and amiodarone discontinued to see if symptoms would improve.  Continues with bouts of confusion and minimal urine output. Renal monitoring to see if patient with  Back in atrial fibrillation with heart rates 100-120s. Pacer wires discontinued today. To continue on coumadin for A Fib with INR goal 2-3. Therapies ongoing and patient deconditioned. CIR recommended by therapy team and patient admitted today.      Review of Systems  Constitutional: Positive for malaise/fatigue (especially past HD. Tooks naps X 2 daily. ).  HENT: Positive for hearing loss (deaf in left ear.). Negative for neck pain.   Respiratory: Positive for shortness of breath. Negative for wheezing.    Cardiovascular: Positive for chest pain and palpitations.  Gastrointestinal: Positive for nausea (complaint while in bed "6") and constipation (No BM for 5 days.). Negative for heartburn and vomiting.  Genitourinary: Negative for dysuria, urgency and frequency.  Musculoskeletal: Negative for myalgias and back pain.  Neurological: Positive for dizziness.  Psychiatric/Behavioral: Positive for memory loss.     Past Medical History   Diagnosis  Date   .  Cancer     .  Hypertension     .  Collagen vascular disease     .  Anginal pain     .  Depression     .  Arthritis     .  Peripheral edema         chronic/wears TEDs   .  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  Eye surgery       .  Coronary artery bypass graft  N/A  05/13/2013       Procedure: CORONARY ARTERY BYPASS GRAFTING (CABG);  Surgeon: Alleen Borne, MD;  Location: [facility] OR;  Service: Open Heart Surgery;  Laterality: N/A;   .  Insertion of dialysis catheter  N/A  05/26/2013       Procedure: INSERTION OF DIALYSIS CATHETER;  Surgeon: Sherren Kerns, MD;  Location: [facility] OR;  Service: Vascular;  Laterality: N/A;  right IJ    History reviewed. No pertinent family history.   Social History: Married--Wife elderly but can provide supervision past  discharge. Independent without AD. Used to work for [employer] He reports that he smoked years ago. He does not have any smokeless tobacco history on file. He reports that he does not drink alcohol or use illicit drugs.         Allergies   Allergen  Reactions   .  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  simvastatin (ZOCOR) 40 MG tablet  Take 40 mg by mouth every evening.         .  [DISCONTINUED] aspirin EC 81 MG tablet  Take 81 mg by mouth daily.         .  [DISCONTINUED] diltiazem (TIAZAC) 360 MG 24 hr capsule  Take 360 mg by mouth  daily.         .  [DISCONTINUED] warfarin (COUMADIN) 5 MG tablet  Take 5 mg by mouth daily.            Home: Home Living Family/patient expects to be discharged to:: Private residence Living Arrangements: Spouse/significant other;Children (sons can help at DC 24hrs) Available Help at Discharge: Family;Available 24 hours/day Type of Home: House Home Access: Stairs to enter [employer] of Steps: 3 Home Layout: One level Home Equipment: Walker - 2 wheels;Bedside commode;Wheelchair - manual    Functional History: Prior Function Comments: Was driving PTA.   Functional Status:   Mobility: Bed Mobility Bed Mobility: Right Sidelying to Sit;Left Sidelying to Sit;Rolling Left;Sitting - Scoot to Edge of Bed Rolling Left: 4: Min assist Right Sidelying to Sit: 4: Min assist Left Sidelying to Sit: 4: Min assist Sitting - Scoot to Edge of Bed: 5: Supervision Sit to Supine: Not Tested (comment) Sit to Supine: Patient Percentage: 60% Transfers Transfers: Sit to Stand;Stand to Sit Sit to Stand: From chair/3-in-1;Without upper extremity assist;3: Mod assist Sit to Stand: Patient Percentage: 70% Stand to Sit: 3: Mod assist Ambulation/Gait Ambulation/Gait Assistance: 4: Min guard Ambulation Distance (Feet): 3 Feet Assistive device: Rolling walker Ambulation/Gait Assistance Details: cuing for posture Gait Pattern: Trunk flexed;Shuffle Gait velocity: decreased Stairs: No Wheelchair Mobility Wheelchair Mobility: No   ADL: ADL Eating/Feeding: Supervision/safety;Other (comment) (full supervision) Grooming: Minimal assistance Where Assessed - Grooming: Unsupported sitting Upper Body Bathing: Moderate assistance Where Assessed - Upper Body Bathing: Unsupported sitting Lower Body Bathing: Moderate assistance Where Assessed - Lower Body Bathing: Supported sit to stand Upper Body Dressing: Moderate assistance Where Assessed - Upper Body Dressing: Unsupported sitting Lower Body  Dressing: Maximal assistance Where Assessed - Lower Body Dressing: Supported sit to stand Toilet Transfer: Moderate assistance Toilet Transfer Method: Sit to stand Equipment Used: Gait belt Transfers/Ambulation Related to ADLs: mod A ADL Comments: functional decline   Cognition: Cognition Overall Cognitive Status: Impaired/Different from baseline Orientation Level: Oriented X4 Cognition Arousal/Alertness: Awake/alert Behavior During Therapy: Flat affect Overall Cognitive Status: Impaired/Different from baseline Area of Impairment: Orientation Orientation Level: Disoriented to;Time Current Attention Level: Selective Memory: Decreased recall of precautions;Decreased short-term memory General Comments: Pt alert today and was able to follow during PT treatment   Physical Exam: Blood pressure 110/50, pulse 77, temperature 97.4 F (36.3 C), temperature source Oral, resp. rate 18, height 6\' 1"  (1.854 m), weight 99.3 kg (218 lb 14.7 oz), SpO2 93.00%.     Physical Exam  Nursing note and vitals reviewed. Constitutional: He is oriented to person, place, and time. He  appears well-developed and well-nourished.  Keeps eyes closed  HENT:   Head: Normocephalic and atraumatic.  Eyes: Conjunctivae are normal. Pupils are equal, round, and reactive to light.  Neck: Normal range of motion. Neck supple. No thyromegaly present.  Cardiovascular: Normal rate.  An irregularly irregular rhythm present. No murmur Pulmonary/Chest: Effort normal and breath sounds normal. No respiratory distress. He has no wheezes.  Abdominal: Soft. Bowel sounds are normal. He exhibits no distension. There is no tenderness.  Musculoskeletal: He exhibits no edema and no tenderness. Right greater troch tender to touch.  Stasis changes BLE. Incisions BLE clean, dry  And intact.  Neurological: He is oriented to person, place, and time. Appropriate conversationally. Processed simple questions and performed tasks without  substantial delay. Took him a few seconds to recall the year. Used calendar to recall day. Able to follow simple commands without difficulty.  Motor strength is 4/5 in left hip flexor knee extensor ankle dorsiflexor and plantar flexor 3 minus right hip flexor, knee extensors, ankle dorsiflexor plantar flexor, 5/5 bilateral deltoid, bicep, tricep, grip.  Decreased sensation right foot compared to left foot, but difference negligible   Skin: Skin is warm and dry.  Sternal incision healing well. Sacral wound with stage 2 50/50 wound, approximately 2cm diameter, tender. No drainage    Results for orders placed during the hospital encounter of 05/12/13 (from the past 48 hour(s))   CBC     Status: Abnormal     Collection Time      06/02/13  5:18 AM       Result  Value  Range     WBC  8.3   4.0 - 10.5 K/uL     RBC  3.10 (*)  4.22 - 5.81 MIL/uL     Hemoglobin  7.7 (*)  13.0 - 17.0 g/dL     HCT  96.0 (*)  45.4 - 52.0 %     MCV  85.2   78.0 - 100.0 fL     MCH  24.8 (*)  26.0 - 34.0 pg     MCHC  29.2 (*)  30.0 - 36.0 g/dL     RDW  09.8 (*)  11.9 - 15.5 %     Platelets  282   150 - 400 K/uL   RENAL FUNCTION PANEL     Status: Abnormal     Collection Time      06/02/13  5:18 AM       Result  Value  Range     Sodium  136   135 - 145 mEq/L     Potassium  4.5   3.5 - 5.1 mEq/L     Chloride  98   96 - 112 mEq/L     CO2  22   19 - 32 mEq/L     Glucose, Bld  91   70 - 99 mg/dL     BUN  72 (*)  6 - 23 mg/dL     Creatinine, Ser  1.47 (*)  0.50 - 1.35 mg/dL     Calcium  8.5   8.4 - 10.5 mg/dL     Phosphorus  6.2 (*)  2.3 - 4.6 mg/dL     Albumin  1.9 (*)  3.5 - 5.2 g/dL     GFR calc non Af Amer  5 (*)  >90 mL/min     GFR calc Af Amer  6 (*)  >90 mL/min     Comment:  The eGFR has been calculated        using the CKD EPI equation.        This calculation has not been        validated in all clinical        situations.        eGFR's persistently        <90 mL/min signify         possible Chronic Kidney Disease.   PROTIME-INR     Status: Abnormal     Collection Time      06/02/13  5:18 AM       Result  Value  Range     Prothrombin Time  27.4 (*)  11.6 - 15.2 seconds     INR  2.66 (*)  0.00 - 1.49   GLUCOSE, CAPILLARY     Status: None     Collection Time      06/02/13  1:01 PM       Result  Value  Range     Glucose-Capillary  78   70 - 99 mg/dL   GLUCOSE, CAPILLARY     Status: None     Collection Time      06/02/13  4:29 PM       Result  Value  Range     Glucose-Capillary  90   70 - 99 mg/dL   CBC     Status: Abnormal     Collection Time      06/03/13  4:55 AM       Result  Value  Range     WBC  7.7   4.0 - 10.5 K/uL     RBC  3.28 (*)  4.22 - 5.81 MIL/uL     Hemoglobin  8.4 (*)  13.0 - 17.0 g/dL     HCT  16.1 (*)  09.6 - 52.0 %     MCV  86.3   78.0 - 100.0 fL     MCH  25.6 (*)  26.0 - 34.0 pg     MCHC  29.7 (*)  30.0 - 36.0 g/dL     RDW  04.5 (*)  40.9 - 15.5 %     Platelets  269   150 - 400 K/uL   RENAL FUNCTION PANEL     Status: Abnormal     Collection Time      06/03/13  4:55 AM       Result  Value  Range     Sodium  137   135 - 145 mEq/L     Potassium  3.9   3.5 - 5.1 mEq/L     Chloride  98   96 - 112 mEq/L     CO2  26   19 - 32 mEq/L     Glucose, Bld  81   70 - 99 mg/dL     BUN  35 (*)  6 - 23 mg/dL     Comment:  DELTA CHECK NOTED     Creatinine, Ser  6.06 (*)  0.50 - 1.35 mg/dL     Comment:  REPEATED TO VERIFY     Calcium  8.6   8.4 - 10.5 mg/dL     Phosphorus  4.2   2.3 - 4.6 mg/dL     Albumin  2.0 (*)  3.5 - 5.2 g/dL     GFR calc non Af Amer  8 (*)  >90 mL/min     GFR calc Af Denyse Dago  9 (*)  >90 mL/min     Comment:                The eGFR has been calculated        using the CKD EPI equation.        This calculation has not been        validated in all clinical        situations.        eGFR's persistently        <90 mL/min signify        possible Chronic Kidney Disease.   PROTIME-INR     Status: Abnormal     Collection Time       06/03/13  4:55 AM       Result  Value  Range     Prothrombin Time  24.4 (*)  11.6 - 15.2 seconds     INR  2.28 (*)  0.00 - 1.49    No results found.   Post Admission Physician Evaluation: Functional deficits secondary  to deconditioning after multiple medical issues. Patient is admitted to receive collaborative, interdisciplinary care between the physiatrist, rehab nursing staff, and therapy team. Patient's level of medical complexity and substantial therapy needs in context of that medical necessity cannot be provided at a lesser intensity of care such as a SNF. Patient has experienced substantial functional loss from his/her baseline which was documented above under the "Functional History" and "Functional Status" headings.  Judging by the patient's diagnosis, physical exam, and functional history, the patient has potential for functional progress which will result in measurable gains while on inpatient rehab.  These gains will be of substantial and practical use upon discharge  in facilitating mobility and self-care at the household level. Physiatrist will provide 24 hour management of medical needs as well as oversight of the therapy plan/treatment and provide guidance as appropriate regarding the interaction of the two. 24 hour rehab nursing will assist with bladder management, bowel management, safety, skin/wound care, disease management, medication administration, pain management and patient education  and help integrate therapy concepts, techniques,education, etc. PT will assess and treat for/with: Lower extremity strength, range of motion, stamina, balance, functional mobility, safety, adaptive techniques and equipment, education.   Goals are: mod I. OT will assess and treat for/with: ADL's, functional mobility, safety, upper extremity strength, adaptive techniques and equipment, education.   Goals are: mod I. SLP will assess and treat for/with: cognition, communication.  Goals are: mod I  to supervision. Case Management and Social Worker will assess and treat for psychological issues and discharge planning. Team conference will be held weekly to assess progress toward goals and to determine barriers to discharge. Patient will receive at least 3 hours of therapy per day at least 5 days per week. ELOS: 7-10 days        Prognosis:  good     Medical Problem List and Plan: 1. DVT Prophylaxis/Anticoagulation: Pharmaceutical: Coumadin 2. Pain Management:  Will discontinue hydrocodone and use tylenol prn for pain.   3. Mood:  Has anxiety regarding renal status. Family reports confusion/lethargy past HD sessions. Will monitor and offer ego support. LCSW to follow for evaluation.   4. Neuropsych: This patient is capable of making decisions on his own behalf. 5. Acute on Chronic renal failure: Continues with poor urine output. Check daily weights, strict I & O. Continue renal diet. HD--T,T,Sa. 6. Anemia of chronic disease: On nulecit IV for n ow. 7. Atrial Fibrillation: Monitor with bid  checks. Amiodarone d/c and coreg increased today.  Monitor for bradycardia.   8. Peripheral edema: will order TEDs for use when OOB. 9. Constipation: Use sorbitol today. Set bowel program.  Not on fluid restrictions currently.   Ranelle Oyster, MD, [facility] Physical Medicine & Rehabilitation   06/03/2013

## 2013-06-03 NOTE — Progress Notes (Signed)
[facility] ROUNDING NOTE  Subjective:  Continued issues with dizziness and nausea when he gets up, despite no orthostatic hypotension Amio stopped as possible culprit Has not been orthostatic Brief episode of AFib in HD yesterday, with drop in BP and transient confusion - wife states now back to baseline Still with minimal UOP  Objective Vital signs in last 24 hours: Filed Vitals:   06/02/13 1900 06/03/13 0314 06/03/13 0640 06/03/13 0805  BP: 113/51  117/56 110/50  Pulse: 76  74 77  Temp: 98.7 F (37.1 C)  97.4 F (36.3 C)   TempSrc: Oral  Oral   Resp: 16  16 18   Height:      Weight:  99.3 kg (218 lb 14.7 oz)    SpO2: 90%  91% 93%   Weight change: 0.9 kg (1 lb 15.8 oz)  Intake/Output Summary (Last 24 hours) at 06/03/13 0903 Last data filed at 06/03/13 0800  Gross per 24 hour  Intake    240 ml  Output   2552 ml  Net  -2312 ml   Physical Exam:  BP 110/50  Pulse 77  Temp(Src) 97.4 F (36.3 C) (Oral)  Resp 18  Ht 6\' 1"  (1.854 m)  Wt 99.3 kg (218 lb 14.7 oz)  BMI 28.89 kg/m2  SpO2 93% Awake and appropriate Not nauseous right now Lungs with diminished BS but no crackles Sternotomy scar healing nicely Abd soft and not tender Leg incisions healing No edema LE's Right IJ tunnelled HD cath dsg in place (05/26/13)  Labs: Basic Metabolic Panel:  Recent Labs Lab 05/28/13 0230 05/29/13 0420 05/30/13 0535 05/31/13 0425 06/01/13 0500 06/02/13 0518 06/03/13 0455  NA 132* 136 134* 136 135 136 137  K 4.7 4.3 4.7 3.9 4.1 4.5 3.9  CL 95* 97 95* 97 97 98 98  CO2 25 29 23 30 24 22 26   GLUCOSE 94 100* 99 93 83 91 81  BUN 52* 37* 62* 37* 56* 72* 35*  CREATININE 5.55* 4.55* 6.68* 4.89* 6.81* 9.13* 6.06*  CALCIUM 8.0* 8.0* 8.1* 8.1* 8.1* 8.5 8.6  PHOS 6.9* 6.0* 6.0* 4.2 5.2* 6.2* 4.2   Results for [name], [name] (MRN [id number]) as of 06/01/2013 07:32   Ref. Range  05/31/2013 10:33   Iron  Latest Range: 42-135 ug/dL  26 (L)   UIBC  Latest Range: 125-400  ug/dL  409   TIBC  Latest Range: 215-435 ug/dL  811   Saturation Ratios  Latest Range: 20-55 %  10 (L)     Recent Labs Lab 06/01/13 0500 06/02/13 0518 06/03/13 0455  ALBUMIN 1.8* 1.9* 2.0*    Recent Labs Lab 05/31/13 0425 06/01/13 0500 06/02/13 0518 06/03/13 0455  WBC 12.0* 10.3 8.3 7.7  HGB 8.2* 8.2* 7.7* 8.4*  HCT 26.2* 26.7* 26.4* 28.3*  MCV 84.0 84.0 85.2 86.3  PLT 231 251 282 269   Lab Results  Component Value Date   INR 2.28* 06/03/2013   INR 2.66* 06/02/2013   INR 2.40* 06/01/2013    Studies/Results: No results found. Medications:   . atorvastatin  20 mg Oral q1800  . calcium acetate  667 mg Oral TID WC  . carvedilol  3.125 mg Oral BID WC  . darbepoetin (ARANESP) injection - NON-DIALYSIS  60 mcg Subcutaneous Q Tue-1800  . feeding supplement  1 Container Oral TID BM  . ferric gluconate (FERRLECIT/NULECIT) IV  125 mg Intravenous Q T,Th,Sa-HD  . levothyroxine  100 mcg Oral QAC breakfast  . multivitamin  1  tablet Oral Daily  . pantoprazole  40 mg Oral Daily  . sodium chloride  3 mL Intravenous Q12H  . warfarin  2.5 mg Oral q1800  . Warfarin - Physician Dosing Inpatient   Does not apply q1800  sodium chloride, acetaminophen, albuterol, food thickener, guaiFENesin-dextromethorphan, HYDROcodone-acetaminophen, ondansetron (ZOFRAN) IV, sodium chloride  I  have reviewed scheduled and prn medications.   ASSESSMENT/RECOMMENDATIONS   79yo WM with baseline CKD (1.7-1.7) with oligoanuric ischemic ATN post CP/cardiogenic shock, cath, CABG (7/9), requiring renal replacement therapy since 7/17 with CRRT, transitioned to intermittent HD on 7/21, now with TDC placed 7/22. On TTS schedule with hopes of recovery of function.   AKI on CKD Dialysis dependent since 05/21/13.   Minimal UOP.  Too early to call ESRD.  Continue TTS schedule until evidence of recovery.  Inpt rehab feels LTAC better choice because of the renal failure  Has been accepted at [facility] when bed  available Next HD Thursday 7/31 4 hours 2K bath Weights all over the place so unclear EDW (weigh pre and post in HD tomorrow and remove 1 liter)   SP CABG Per TCTS Needs rehab On coumadin Amio d/c'd   Anemia  On aranesp 60 QTues.  Tsat low.  Replete with HD X 10 doses of ferrlecit (started 7/29)   A fib, now SR.  Transient episode in HD 7/29 - back in NSR now  Deconditioning  LTAC (cannot go to SNF and dialyze as outpt b/c cannot be deemed ESRD at this time - too early).  Accepted at [facility]  CKD-MBD Now on binders No PTH data     Camille Bal, MD [facility] [phone] pager 06/03/2013, 9:03 AM

## 2013-06-03 NOTE — Progress Notes (Signed)
Physical Therapy Treatment Patient Details Name: [name] MRN: [id number] DOB: 08-17-1934 Today's Date: 06/03/2013 Time: 1132-1200 PT Time Calculation (min): 28 min  PT Assessment / Plan / Recommendation  History of Present Illness Pt transferred from [facility] with NSTEMI s/p CABG with elevated creatinine and lethargy, CVVHD x 3 days   PT Comments   Pt admitted with CABG with vertigo post op. Pt currently with functional limitations due to continued endurance and vertigo issues.  Pt will benefit from skilled PT to increase their independence and safety with mobility to allow discharge to the venue listed below.   Follow Up Recommendations  LTACH;Supervision/Assistance - 24 hour                 Equipment Recommendations  None recommended by PT        Frequency Min 3X/week   Progress towards PT Goals Progress towards PT goals: Progressing toward goals  Plan Current plan remains appropriate    Precautions / Restrictions Precautions Precautions: Sternal;Fall Precaution Comments: vertigo Restrictions Weight Bearing Restrictions: No   Pertinent Vitals/Pain VSS, No pain    Mobility  Bed Mobility Bed Mobility: Right Sidelying to Sit;Left Sidelying to Sit;Rolling Left;Sitting - Scoot to Edge of Bed Rolling Left: 4: Min assist Right Sidelying to Sit: 4: Min assist Left Sidelying to Sit: 4: Min assist Sitting - Scoot to Edge of Bed: 5: Supervision Sit to Supine: 4: Min assist Details for Bed Mobility Assistance: cueing for sequence and precautions with particular cueing not to pull.  Assessed pt for BPPV via  Modified Hallpike with  left being positive today.  Modified Hallpike to right was negative.  Performed left canalith repositioning maneuver using reverse trendelenberg bed position given patient's recent surgery.  After positioning complete, assisted pt back to bed.  Pt reports that nausea was initially 6/10 and 2 min after positioning  nausea at 5/10.    Transfers Transfers: Sit to Stand;Stand to Sit Sit to Stand: 4: Min guard;From elevated surface;From bed;Without upper extremity assist Stand to Sit: 4: Min guard;Without upper extremity assist;To bed Details for Transfer Assistance: cues needed for hand placement and for sternal precautions to place hands on knees  Ambulation/Gait Ambulation/Gait Assistance: 4: Min guard Ambulation Distance (Feet): 112 Feet (56 feet x2) Assistive device: Rolling walker Ambulation/Gait Assistance Details: cuing needed for posture and positionin RW.  Pt stated he felt much less nausea today.   Gait Pattern: Trunk flexed;Shuffle Gait velocity: decreased Stairs: No Wheelchair Mobility Wheelchair Mobility: No    PT Goals (current goals can now be found in the care plan section)    Visit Information  Last PT Received On: 06/03/13 Assistance Needed: +2 (to get appropriate vestibular positioning) History of Present Illness: Pt transferred from [facility] with NSTEMI s/p CABG with elevated creatinine and lethargy, CVVHD x 3 days    Subjective Data  Subjective: "I am less dizzy now." pt stated after repositioning treatment   Cognition  Cognition Arousal/Alertness: Awake/alert Behavior During Therapy: Flat affect Overall Cognitive Status: Impaired/Different from baseline Area of Impairment: Orientation Orientation Level: Disoriented to;Time Current Attention Level: Selective Memory: Decreased recall of precautions;Decreased short-term memory    Balance  Static Sitting Balance Static Sitting - Balance Support: Feet supported;Bilateral upper extremity supported Static Sitting - Level of Assistance: 5: Stand by assistance Static Sitting - Comment/# of Minutes: 5  End of Session PT - End of Session Equipment Utilized During Treatment: Gait belt Activity Tolerance: Patient tolerated treatment well Patient left: in bed;with call bell/phone  within reach;with family/visitor present Nurse Communication:  Mobility status        [name],[name] 06/03/2013, 1:34 PM [facility] Acute Rehabilitation [phone] (pager)

## 2013-06-03 NOTE — Progress Notes (Signed)
Rehab admissions - I talked to Dr. Wynn Banker this am about ongoing temp HD for patient.  I have approval for inpatient rehab admission today.  Will admit to inpatient rehab today.  Call me for questions.  #[phone]

## 2013-06-03 NOTE — PMR Pre-admission (Signed)
PMR Admission Coordinator Pre-Admission Assessment  Patient: [name] is an 79 y.o., male MRN: [id number] DOB: [birth year] Height: 6\' 1"  (185.4 cm) Weight: 99.3 kg (218 lb 14.7 oz)              Insurance Information HMO:    PPO:       PCP:       IPA:       80/20:       OTHER:   PRIMARY: Medicare A/B      Policy#: [id number]      Subscriber: Wendee Copp CM Name:        Phone#:       Fax#:   Pre-Cert#:        Employer: Retired Benefits:  Phone #:       Name: Armed forces technical officer. Date: 10/05/98     Deduct: $1216      Out of Pocket Max: none      Life Max: unlimited CIR: 100%      SNF: 100 days Outpatient: 80%     Co-Pay: 20% Home Health: 100%      Co-Pay: none DME: 80%     Co-Pay: 20% Providers: patient's choice  SECONDARY: [employer] of [place]      Policy#: [id number]      Subscriber: Wendee Copp CM Name:        Phone#:       Fax#:   Pre-Cert#:        Employer: Retired Benefits:  Phone #: [phone]     Name:   Eff. Date:       Deduct:        Out of Pocket Max:        Life Max:   CIR:        SNF:   Outpatient:       Co-Pay:   Home Health:        Co-Pay:   DME:       Co-Pay:    Emergency Contact Information Contact Information   Name Relation Home Work Mobile   Sanzo,Beatrice Spouse [phone]     Leonidus, Rowand   [phone]     Current Medical History  Patient Admitting Diagnosis:  Deconditioning after myocardial infarction and emergent coronary artery bypass grafting    History of Present Illness:  A 79 y.o. male with PMhx significant for HTN, RA, hypothyroidism along with diffuse vascular dz to include CAD, PAD, AAA and common iliac artery dissection. He also has hx of atrial flutter and CKD --Cr 1.77 when admitted on 7/9 with CP and recurrent a flutter with positive troponin. His hospital course progressed quickly with him needing cardiac cath on 7/9 and eventually semi emergent CABG that same day. Post op with hypotension and declining renal function with decrease in  UOP. Noted to be volume overloaded as well as somnolence on 07/14. Renal consulted and patient failed diuretic challenge requiring dialysis support. Started on IV heparin for persistent A Fib.ST evaluation done 07/18 due to concerns of dysphagia and FEES done mild oropharyngeal stage dysphagia--D3, nectar liquids recommended due to intermittent lethargy/ deconditioning--advanced to thins. HD ongoing since 05/21/13 T-TH-Sat schedule, but limited due to low BP. IJ placed on 07/22 as he due to lack of UOP.  He started developing dizziness with nausea past few days with question of vestibular component. Back in atrial fibrillation with heart rates `100-120s.  Had some transient confusion 07/29.  Still with minimal UOP.  Pacing wires removed 06/03/13.  Past Medical History  Past Medical History  Diagnosis Date  . Cancer   . Hypertension   . Collagen vascular disease   . Anginal pain   . Depression   . Arthritis    Family History  family history is not on file.  Prior Rehab/Hospitalizations: No previous rehab admissions.   Current Medications  Current facility-administered medications:0.9 %  sodium chloride infusion, 250 mL, Intravenous, PRN, Loreli Slot, MD;  acetaminophen (TYLENOL) tablet 650 mg, 650 mg, Oral, Q4H PRN, Loreli Slot, MD, 650 mg at 05/27/13 2240;  albuterol (PROVENTIL) (5 MG/ML) 0.5% nebulizer solution 2.5 mg, 2.5 mg, Nebulization, Q6H PRN, Alleen Borne, MD atorvastatin (LIPITOR) tablet 20 mg, 20 mg, Oral, q1800, Alleen Borne, MD, 20 mg at 06/02/13 1634;  calcium acetate (PHOSLO) capsule 667 mg, 667 mg, Oral, TID WC, Dyke Maes, MD, 667 mg at 06/03/13 0735;  carvedilol (COREG) tablet 3.125 mg, 3.125 mg, Oral, BID WC, Alleen Borne, MD;  darbepoetin (ARANESP) injection 60 mcg, 60 mcg, Subcutaneous, Q Tue-1800, Cecille Aver, MD, 60 mcg at 06/02/13 1123 feeding supplement (RESOURCE BREEZE) liquid 1 Container, 1 Container, Oral, TID BM, Hettie Holstein, RD, 1 Container at 06/02/13 2015;  ferric gluconate (NULECIT) 125 mg in sodium chloride 0.9 % 100 mL IVPB, 125 mg, Intravenous, Q T,Th,Sa-HD, Sadie Haber, MD, 125 mg at 06/02/13 1100;  food thickener (THICK IT) powder, , Oral, PRN, Alleen Borne, MD guaiFENesin-dextromethorphan (ROBITUSSIN DM) 100-10 MG/5ML syrup 15 mL, 15 mL, Oral, Q4H PRN, Loreli Slot, MD;  HYDROcodone-acetaminophen (NORCO/VICODIN) 5-325 MG per tablet 1-2 tablet, 1-2 tablet, Oral, Q4H PRN, Kerin Perna, MD, 2 tablet at 06/01/13 0910;  levothyroxine (SYNTHROID, LEVOTHROID) tablet 100 mcg, 100 mcg, Oral, QAC breakfast, Wayne E Gold, PA-C, 100 mcg at 06/03/13 1610 multivitamin (RENA-VIT) tablet 1 tablet, 1 tablet, Oral, Daily, Dyke Maes, MD, 1 tablet at 06/02/13 1328;  ondansetron (ZOFRAN) injection 4 mg, 4 mg, Intravenous, Q6H PRN, Erin Barrett, PA-C, 4 mg at 06/02/13 0055;  pantoprazole (PROTONIX) EC tablet 40 mg, 40 mg, Oral, Daily, Crystal Stillinger Robertson, RPH, 40 mg at 06/02/13 1328 sodium chloride 0.9 % injection 3 mL, 3 mL, Intravenous, Q12H, Loreli Slot, MD, 3 mL at 05/29/13 2144;  sodium chloride 0.9 % injection 3 mL, 3 mL, Intravenous, PRN, Loreli Slot, MD;  warfarin (COUMADIN) tablet 2.5 mg, 2.5 mg, Oral, q1800, Wilmon Pali, PA-C, 2.5 mg at 06/01/13 1701;  Warfarin - Physician Dosing Inpatient, , Does not apply, q1800, Alleen Borne, MD  Patients Current Diet: Dysphagia  Precautions / Restrictions Precautions Precautions: Sternal;Fall Precaution Comments: vertigo Restrictions Weight Bearing Restrictions: No   Prior Activity Level Community (5-7x/wk): Went out daily.  Has a garden, supervises yard mowing, goes to [employer].  Home Assistive Devices / Equipment Home Assistive Devices/Equipment: None Home Equipment: Walker - 2 wheels;Bedside commode;Wheelchair - manual  Prior Functional Level Prior Function Level of Independence:  Independent Comments: Was driving PTA.  Current Functional Level Cognition  Overall Cognitive Status: Impaired/Different from baseline Current Attention Level: Selective Orientation Level: Oriented X4 General Comments: Pt alert today and was able to follow during PT treatment    Extremity Assessment (includes Sensation/Coordination)  Upper Extremity Assessment: Generalized weakness  Lower Extremity Assessment: Defer to PT evaluation    ADLs  Eating/Feeding: Supervision/safety;Other (comment) (full supervision) Grooming: Minimal assistance Where Assessed - Grooming: Unsupported sitting Upper Body Bathing: Moderate assistance Where Assessed - Upper Body Bathing: Unsupported  sitting Lower Body Bathing: Moderate assistance Where Assessed - Lower Body Bathing: Supported sit to stand Upper Body Dressing: Moderate assistance Where Assessed - Upper Body Dressing: Unsupported sitting Lower Body Dressing: Maximal assistance Where Assessed - Lower Body Dressing: Supported sit to stand Toilet Transfer: Moderate assistance Toilet Transfer Method: Sit to stand Toileting - Clothing Manipulation and Hygiene: Maximal assistance Equipment Used: Gait belt Transfers/Ambulation Related to ADLs: mod A ADL Comments: functional decline    Mobility  Bed Mobility: Right Sidelying to Sit;Left Sidelying to Sit;Rolling Left;Sitting - Scoot to Edge of Bed Rolling Left: 4: Min assist Right Sidelying to Sit: 4: Min assist Left Sidelying to Sit: 4: Min assist Sitting - Scoot to Edge of Bed: 5: Supervision Sit to Supine: Not Tested (comment) Sit to Supine: Patient Percentage: 60%    Transfers  Transfers: Sit to Stand;Stand to Sit Sit to Stand: From chair/3-in-1;Without upper extremity assist;3: Mod assist Sit to Stand: Patient Percentage: 70% Stand to Sit: 3: Mod assist    Ambulation / Gait / Stairs / Psychologist, prison and probation services  Ambulation/Gait Ambulation/Gait Assistance: 4: Min Government social research officer  (Feet): 3 Feet Assistive device: Rolling walker Ambulation/Gait Assistance Details: cuing for posture Gait Pattern: Trunk flexed;Shuffle Gait velocity: decreased Stairs: No Wheelchair Mobility Wheelchair Mobility: No    Posture / Games developer Sitting - Balance Support: Feet supported;Bilateral upper extremity supported Static Sitting - Level of Assistance: 5: Stand by assistance Static Sitting - Comment/# of Minutes: 3    Special needs/care consideration BiPAP/CPAP No CPM No Continuous Drip IV No Dialysis Yes       DaysT-TH-Sat Life Vest No Oxygen No Special Bed No Trach Size No Wound Vac (area) No    Skin Has a healing left lower leg incision.  L lover leg with discoloration                              Bowel mgmt: Last BM 05/29/13 Bladder mgmt: Has a condom catheter, only small amouts of urine output Diabetic mgmt No   Previous Home Environment Living Arrangements: Spouse/significant other;Children (sons can help at DC 24hrs) Available Help at Discharge: Family;Available 24 hours/day Type of Home: House Home Layout: One level Home Access: Stairs to enter [employer] of Steps: 3 Bathroom Shower/Tub: Engineer, manufacturing systems: Standard Home Care Services: No  Discharge Living Setting Plans for Discharge Living Setting: Patient's home;House;Lives with (comment) (Lives with wife.) Type of Home at Discharge: House Discharge Home Layout: One level Discharge Home Access: Stairs to enter Entrance Stairs-Number of Steps: 4 at front and 2 at back entry Does the patient have any problems obtaining your medications?: No  Social/Family/Support Systems Patient Roles: Spouse;Parent (Has 2 sons, several grand children.) Contact Information: Hyman Bower - wife Anticipated Caregiver: Grandson 70 yo Shakeem Stern and son Chaunce Winkels (c) [phone] Anticipated Caregiver's Contact Information: Fayrene Fearing - son [phone] Ability/Limitations of  Caregiver: Wife can provide supervision.  Son has 2 months FMLA left.  Grandson not working and can assist. Medical laboratory scientific officer: 24/7 Discharge Plan Discussed with Primary Caregiver: Yes Is Caregiver In Agreement with Plan?: Yes Does Caregiver/Family have Issues with Lodging/Transportation while Pt is in Rehab?: No  Goals/Additional Needs Patient/Family Goal for Rehab: PT/OT S, ST mod I goals Expected length of stay: 10-12 days Cultural Considerations: None Dietary Needs: Dys 3, thin liquids Equipment Needs: TBD Pt/Family Agrees to Admission and willing to participate: Yes Program Orientation Provided & Reviewed with  Pt/Caregiver Including Roles  & Responsibilities: Yes  Decrease burden of Care through IP rehab admission:  Not applicable  Possible need for SNF placement upon discharge: Cannot go to SNF due to temporary HD.  Not yet deemed ESRD.  Patient Condition: This patient's medical and functional status has changed since the consult dated: 05/28/13 in which the Rehabilitation Physician determined and documented that the patient's condition is appropriate for intensive rehabilitative care in an inpatient rehabilitation facility. See "History of Present Illness" (above) for medical update. Functional changes are: Currently requiring mod A with toilet transfers.  Ambulated min guard assist with PT 3 feet RW. Patient's medical and functional status update has been discussed with the Rehabilitation physician and patient remains appropriate for inpatient rehabilitation. Will admit to inpatient rehab today.  Preadmission Screen Completed By:  Trish Mage, 06/03/2013 10:58 AM ______________________________________________________________________   Discussed status with Dr. Wynn Banker on 06/03/13 at 0900 and received telephone approval for admission today.  Admission Coordinator:  Trish Mage, time1114/Date07/30/14

## 2013-06-03 NOTE — Progress Notes (Signed)
ANTICOAGULATION CONSULT NOTE - Initial Consult  Pharmacy Consult for Coumadin Indication: atrial fibrillation  Allergies  Allergen Reactions  . 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   Medical History: Past Medical History  Diagnosis Date  . Cancer   . Hypertension   . Collagen vascular disease   . Anginal pain   . Depression   . Arthritis   . Peripheral edema     chronic/wears TEDs  . 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 Pt. Is transferred to inpatient rehab on 7/30. Pharmacy is consulted to dose coumadin for hx of DVT and afib. Coumadin was dosed per MD post surgery. INR 2.28 today. Dose was on hold yesterday.   Goal of Therapy:  INR 2-3 Monitor platelets by anticoagulation protocol: Yes   Plan:  - Coumadin 2 mg PO x 1 - f/u daily INR  Bayard Hugger, PharmD, BCPS  Clinical Pharmacist  Pager: [phone]  06/03/2013,3:19 PM

## 2013-06-03 NOTE — Progress Notes (Addendum)
      [address]       [name] [address]             [phone]      8 Days Post-Op Procedure(s) (LRB): INSERTION OF DIALYSIS CATHETER (N/A)  Subjective:  [name] states he is feeling better this morning.  After dialysis yesterday afternoon he suffered an episode of Atrial Fibrillation with hypotension.  He continues to have some nausea and dizziness with ambulation  Objective: Vital signs in last 24 hours: Temp:  [97.4 F (36.3 C)-98.7 F (37.1 C)] 97.4 F (36.3 C) (07/30 0640) Pulse Rate:  [1-100] 74 (07/30 0640) Cardiac Rhythm:  [-] Normal sinus rhythm (07/29 1940) Resp:  [9-18] 16 (07/30 0640) BP: (80-141)/(43-66) 117/56 mmHg (07/30 0640) SpO2:  [90 %-99 %] 91 % (07/30 0640) Weight:  [218 lb 14.7 oz (99.3 kg)-227 lb 8.2 oz (103.2 kg)] 218 lb 14.7 oz (99.3 kg) (07/30 0314)  Intake/Output from previous day: 07/29 0701 - 07/30 0700 In: 120 [P.O.:120] Out: 2552   General appearance: alert, cooperative and no distress Heart: regular rate and rhythm Lungs: clear to auscultation bilaterally Abdomen: soft, non-tender; bowel sounds normal; no masses,  no organomegaly Extremities: edema significant improvement of LE edema after HD Wound: clean and dry  Lab Results:  Recent Labs  06/02/13 0518 06/03/13 0455  WBC 8.3 7.7  HGB 7.7* 8.4*  HCT 26.4* 28.3*  PLT 282 269   BMET:  Recent Labs  06/02/13 0518 06/03/13 0455  NA 136 137  K 4.5 3.9  CL 98 98  CO2 22 26  GLUCOSE 91 81  BUN 72* 35*  CREATININE 9.13* 6.06*  CALCIUM 8.5 8.6    PT/INR:  Recent Labs  06/03/13 0455  LABPROT 24.4*  INR 2.28*   ABG    Component Value Date/Time   PHART 7.343* 05/16/2013 0409   HCO3 20.3 05/16/2013 0409   TCO2 21 05/16/2013 0409   ACIDBASEDEF 5.0* 05/16/2013 0409   O2SAT 90.0 05/16/2013 0409   CBG (last 3)   Recent Labs  06/02/13 1301 06/02/13 1629  GLUCAP 78 90    Assessment/Plan: S/P Procedure(s) (LRB): INSERTION OF DIALYSIS CATHETER  (N/A)  1. CV- brief episode of Atrial Fibrillation yesterday, currently NSR- Coreg at very low dose will monitor heart rate and increase as tolerated, Amiodarone discontinued due to nausea and dizziness 2. Pulm- no acute issues, some minor sputum production, continued use of IS 3. Renal- Acute Renal Failure on CKD, Nephrology following 4. INR 2.26- will get EPW out this morning, continue Coumadin at 2.5 mg daily 5. Deconditioning- PT/OT 6. Dispo- patient is medically stable this morning.  He was denied placement at [facility], however has been accepted at [facility] once bed available   LOS: 22 days    [name] 06/03/2013   Chart reviewed, patient examined, agree with above. He had an episode of A-fib in the 120's yesterday after HD. I stopped his amio due to nausea and dizziness. Will increase the Coreg to 3.125 bid. He still has a central line in the right subclavian which can be removed to prevent dialysis line infection.

## 2013-06-03 NOTE — Progress Notes (Signed)
Nursing Note: Removed EPW per MD order per protocol. Pacing wires intact. VSS 110/50 BP, 77HR. Patient reminded to stay in bed for 1 hour. Will continue to monitor closely. Lajuana Matte, RN

## 2013-06-03 NOTE — Plan of Care (Signed)
Overall Plan of Care [facility]) Patient Details Name: [name] MRN: [id number] DOB: [birth year]  Diagnosis:  Deconditioning after CABG, multiple medical  Co-morbidities: Anemia, afib, edema, pain  Functional Problem List  Patient demonstrates impairments in the following areas: Balance, Bladder, Bowel, Endurance, Medication Management, Motor, Nutrition, Pain, Safety and Skin Integrity  Basic ADL's: grooming, bathing, dressing and toileting Advanced ADL's: laundry  Transfers:  bed mobility, bed to chair, toilet, tub/shower, car and furniture Locomotion:  ambulation, wheelchair mobility and stairs  Additional Impairments:  Swallowing  Anticipated Outcomes Item Anticipated Outcome  Eating/Swallowing    Basic self-care  Overall supervision  Tolieting  Supervision  Bowel/Bladder  Continent of bowel and bladder   Transfers  Mod I   Locomotion  Mod I short distance gait S long distance gait and stairs  Communication  Mod I  Cognition  Supervision to mod I  Pain  3 or less  Safety/Judgment    Other     Therapy Plan: PT Intensity: Minimum of 1-2 x/day ,45 to 90 minutes PT Frequency: 5 out of 7 days PT Duration Estimated Length of Stay: 10 days  OT Intensity: Minimum of 1-2 x/day, 45 to 90 minutes OT Frequency: 5 out of 7 days OT Duration/Estimated Length of Stay: 7-10 days SLP Intensity: Minumum of 1-2 x/day, 30 to 90 minutes SLP Frequency: 5 out of 7 days SLP Duration/Estimated Length of Stay: 7-10 days    Team Interventions: Item RN PT OT SLP SW TR Other  Self Care/Advanced ADL Retraining  x x      Neuromuscular Re-Education  x x      Therapeutic Activities  x x      UE/LE Strength Training/ROM  x x      UE/LE Coordination Activities  x x      Visual/Perceptual Remediation/Compensation         DME/Adaptive Equipment Instruction  x x      Therapeutic Exercise  x x      Balance/Vestibular Training  x x      Patient/Family Education x x x x     Cognitive  Remediation/Compensation   x x     Functional Mobility Training  x x      Ambulation/Gait Training  x       Stair Training  x       Wheelchair Propulsion/Positioning  x       Functional Tourist information centre manager Reintegration  x x      Dysphagia/Aspiration Printmaker    x     Speech/Language Facilitation         Bladder Management x        Bowel Management x        Disease Management/Prevention x x x      Pain Management x x x      Medication Management x   x     Skin Care/Wound Management x x x      Splinting/Orthotics         Discharge Planning  x x x     Psychosocial Support  x x x                            Team Discharge Planning: Destination: PT-Home ,OT- Home , SLP-Home Projected Follow-up: PT-Home health PT, OT-  Home health OT, SLP-Outpatient SLP Projected Equipment Needs: PT-Other (comment) (TBD), OT-  (TBD), SLP-None  recommended by SLP Patient/family involved in discharge planning: PT- Patient;Family member/caregiver,  OT-Patient;Family member/caregiver, SLP-Patient  MD ELOS: 7-10 days Medical Rehab Prognosis:  Excellent Assessment: The patient has been admitted for CIR therapies. The team will be addressing, functional mobility, strength, stamina, balance, safety, adaptive techniques/equipment, self-care, bowel and bladder mgt, patient and caregiver education, cognition, communication. Goals have been set at supervision to [name] Fishman, MD, [facility]      See Team Conference Notes for weekly updates to the plan of care

## 2013-06-03 NOTE — Progress Notes (Signed)
Pt arrived to unit at 1330 with family at bedside. Reviewed rehab booklet and discussed process with pt and family. Safety plan signed with full understanding. Bed alarm on, call bell with in reach, pt denies pain

## 2013-06-03 NOTE — H&P (Signed)
Physical Medicine and Rehabilitation Admission H&P    CC: Deconditioning.   HPI: [name] is a 79 y.o. male with PMhx significant for HTN, RA, hypothyroidism along with diffuse vascular dz to include CAD, PAD, AAA and common iliac artery dissection. He also has hx of atrial flutter and CKD --Cr 1.77 when admitted on 7/9 with CP and recurrent a flutter with positive troponin. His hospital course progressed quickly with him needing cardiac cath on 7/9 and eventually semi emergent CABG that same day. Post op with hypotension and declining renal function with decrease in UOP. Noted to be volume overloaded as well as somnolence on 07/14. Renal consulted and patient failed diuretic challenge requiring dialysis support. Started on IV heparin for persistent A Fib.ST evaluation done 07/18 due to concerns of dysphagia and FEES done mild oropharyngeal stage dysphagia--D3, nectar liquids recommended due to intermittent lethargy and deconditioning--advanced to thins. HD ongoing but limited due to low BP on TTS schedule with hopes of recovery in the future.  IJ placed on 07/22 as he due to lack of UOP.  He started developing lethargy as well as dizziness and nausea with mobility for the past few days.  No orthostatic hypotension and amiodarone discontinued to see if symptoms would improve.  Continues with bouts of confusion and minimal urine output. Renal monitoring to see if patient with  Back in atrial fibrillation with heart rates 100-120s. Pacer wires discontinued today. To continue on coumadin for A Fib with INR goal 2-3. Therapies ongoing and patient deconditioned. CIR recommended by therapy team and patient admitted today.    Review of Systems  Constitutional: Positive for malaise/fatigue (especially past HD. Tooks naps X 2 daily. ).  HENT: Positive for hearing loss (deaf in left ear.). Negative for neck pain.   Respiratory: Positive for shortness of breath. Negative for wheezing.   Cardiovascular:  Positive for chest pain and palpitations.  Gastrointestinal: Positive for nausea (complaint while in bed "6") and constipation (No BM for 5 days.). Negative for heartburn and vomiting.  Genitourinary: Negative for dysuria, urgency and frequency.  Musculoskeletal: Negative for myalgias and back pain.  Neurological: Positive for dizziness.  Psychiatric/Behavioral: Positive for memory loss.    Past Medical History  Diagnosis Date  . Cancer   . Hypertension   . Collagen vascular disease   . Anginal pain   . Depression   . Arthritis   . Peripheral edema     chronic/wears TEDs  . 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 Eye surgery    . Coronary artery bypass graft N/A 05/13/2013    Procedure: CORONARY ARTERY BYPASS GRAFTING (CABG);  Surgeon: Alleen Borne, MD;  Location: [facility] OR;  Service: Open Heart Surgery;  Laterality: N/A;  . Insertion of dialysis catheter N/A 05/26/2013    Procedure: INSERTION OF DIALYSIS CATHETER;  Surgeon: Sherren Kerns, MD;  Location: [facility] OR;  Service: Vascular;  Laterality: N/A;  right IJ   History reviewed. No pertinent family history.  Social History: Married--Wife elderly but can provide supervision past discharge. Independent without AD. Used to work for [employer] He reports that he smoked years ago. He does not have any smokeless tobacco history on file. He reports that he does not drink alcohol or use illicit drugs.     Allergies  Allergen Reactions  . 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  Medications Prior to Admission  Medication Sig Dispense Refill  . simvastatin (ZOCOR) 40 MG tablet Take 40 mg by mouth every evening.      . [DISCONTINUED] aspirin EC 81 MG tablet Take 81 mg by mouth daily.      . [DISCONTINUED] diltiazem (TIAZAC) 360 MG 24 hr capsule Take 360 mg by mouth daily.      . [DISCONTINUED] warfarin (COUMADIN) 5 MG tablet Take 5 mg by mouth daily.         Home: Home Living Family/patient expects to be discharged to:: Private residence Living Arrangements: Spouse/significant other;Children (sons can help at DC 24hrs) Available Help at Discharge: Family;Available 24 hours/day Type of Home: House Home Access: Stairs to enter [employer] of Steps: 3 Home Layout: One level Home Equipment: Walker - 2 wheels;Bedside commode;Wheelchair - manual   Functional History: Prior Function Comments: Was driving PTA.  Functional Status:  Mobility: Bed Mobility Bed Mobility: Right Sidelying to Sit;Left Sidelying to Sit;Rolling Left;Sitting - Scoot to Edge of Bed Rolling Left: 4: Min assist Right Sidelying to Sit: 4: Min assist Left Sidelying to Sit: 4: Min assist Sitting - Scoot to Edge of Bed: 5: Supervision Sit to Supine: Not Tested (comment) Sit to Supine: Patient Percentage: 60% Transfers Transfers: Sit to Stand;Stand to Sit Sit to Stand: From chair/3-in-1;Without upper extremity assist;3: Mod assist Sit to Stand: Patient Percentage: 70% Stand to Sit: 3: Mod assist Ambulation/Gait Ambulation/Gait Assistance: 4: Min guard Ambulation Distance (Feet): 3 Feet Assistive device: Rolling walker Ambulation/Gait Assistance Details: cuing for posture Gait Pattern: Trunk flexed;Shuffle Gait velocity: decreased Stairs: No Wheelchair Mobility Wheelchair Mobility: No  ADL: ADL Eating/Feeding: Supervision/safety;Other (comment) (full supervision) Grooming: Minimal assistance Where Assessed - Grooming: Unsupported sitting Upper Body Bathing: Moderate assistance Where Assessed - Upper Body Bathing: Unsupported sitting Lower Body Bathing: Moderate assistance Where Assessed - Lower Body Bathing: Supported sit to stand Upper Body Dressing: Moderate assistance Where Assessed - Upper Body Dressing: Unsupported sitting Lower Body Dressing: Maximal assistance Where Assessed - Lower Body Dressing: Supported sit to stand Toilet Transfer:  Moderate assistance Toilet Transfer Method: Sit to stand Equipment Used: Gait belt Transfers/Ambulation Related to ADLs: mod A ADL Comments: functional decline  Cognition: Cognition Overall Cognitive Status: Impaired/Different from baseline Orientation Level: Oriented X4 Cognition Arousal/Alertness: Awake/alert Behavior During Therapy: Flat affect Overall Cognitive Status: Impaired/Different from baseline Area of Impairment: Orientation Orientation Level: Disoriented to;Time Current Attention Level: Selective Memory: Decreased recall of precautions;Decreased short-term memory General Comments: Pt alert today and was able to follow during PT treatment  Physical Exam: Blood pressure 110/50, pulse 77, temperature 97.4 F (36.3 C), temperature source Oral, resp. rate 18, height 6\' 1"  (1.854 m), weight 99.3 kg (218 lb 14.7 oz), SpO2 93.00%.   Physical Exam  Nursing note and vitals reviewed. Constitutional: He is oriented to person, place, and time. He appears well-developed and well-nourished.  Keeps eyes closed  HENT:  Head: Normocephalic and atraumatic.  Eyes: Conjunctivae are normal. Pupils are equal, round, and reactive to light.  Neck: Normal range of motion. Neck supple. No thyromegaly present.  Cardiovascular: Normal rate.  An irregularly irregular rhythm present.  Pulmonary/Chest: Effort normal and breath sounds normal. No respiratory distress. He has no wheezes.  Abdominal: Soft. Bowel sounds are normal. He exhibits no distension. There is no tenderness.  Musculoskeletal: He exhibits no edema and no tenderness.  Stasis changes BLE. Incisions BLE clean, dry  And intact.  Neurological: He is oriented to person, place, and time.  Unable  to state month--sedated (used received pain meds per nursing).  Decreased awareness of current issue with impaired insight. Decreased memory. Able to follow simple commands without difficulty.  Motor strength is 4/5 in left hip flexor knee  extensor ankle dorsiflexor and plantar flexor 3 minus right hip flexor, knee extensors, ankle dorsiflexor plantar flexor, 5/5 bilateral deltoid, bicep, tricep, grip.  Decreased sensation right foot compared to left foot.    Skin: Skin is warm and dry.  Sternal incision healing well. Reports of foam dressing on sacrum.    Results for orders placed during the hospital encounter of 05/12/13 (from the past 48 hour(s))  CBC     Status: Abnormal   Collection Time    06/02/13  5:18 AM      Result Value Range   WBC 8.3  4.0 - 10.5 K/uL   RBC 3.10 (*) 4.22 - 5.81 MIL/uL   Hemoglobin 7.7 (*) 13.0 - 17.0 g/dL   HCT 16.1 (*) 09.6 - 04.5 %   MCV 85.2  78.0 - 100.0 fL   MCH 24.8 (*) 26.0 - 34.0 pg   MCHC 29.2 (*) 30.0 - 36.0 g/dL   RDW 40.9 (*) 81.1 - 91.4 %   Platelets 282  150 - 400 K/uL  RENAL FUNCTION PANEL     Status: Abnormal   Collection Time    06/02/13  5:18 AM      Result Value Range   Sodium 136  135 - 145 mEq/L   Potassium 4.5  3.5 - 5.1 mEq/L   Chloride 98  96 - 112 mEq/L   CO2 22  19 - 32 mEq/L   Glucose, Bld 91  70 - 99 mg/dL   BUN 72 (*) 6 - 23 mg/dL   Creatinine, Ser 7.82 (*) 0.50 - 1.35 mg/dL   Calcium 8.5  8.4 - 95.6 mg/dL   Phosphorus 6.2 (*) 2.3 - 4.6 mg/dL   Albumin 1.9 (*) 3.5 - 5.2 g/dL   GFR calc non Af Amer 5 (*) >90 mL/min   GFR calc Af Amer 6 (*) >90 mL/min   Comment:            The eGFR has been calculated     using the CKD EPI equation.     This calculation has not been     validated in all clinical     situations.     eGFR's persistently     <90 mL/min signify     possible Chronic Kidney Disease.  PROTIME-INR     Status: Abnormal   Collection Time    06/02/13  5:18 AM      Result Value Range   Prothrombin Time 27.4 (*) 11.6 - 15.2 seconds   INR 2.66 (*) 0.00 - 1.49  GLUCOSE, CAPILLARY     Status: None   Collection Time    06/02/13  1:01 PM      Result Value Range   Glucose-Capillary 78  70 - 99 mg/dL  GLUCOSE, CAPILLARY     Status: None    Collection Time    06/02/13  4:29 PM      Result Value Range   Glucose-Capillary 90  70 - 99 mg/dL  CBC     Status: Abnormal   Collection Time    06/03/13  4:55 AM      Result Value Range   WBC 7.7  4.0 - 10.5 K/uL   RBC 3.28 (*) 4.22 - 5.81 MIL/uL   Hemoglobin 8.4 (*) 13.0 -  17.0 g/dL   HCT 62.9 (*) 52.8 - 41.3 %   MCV 86.3  78.0 - 100.0 fL   MCH 25.6 (*) 26.0 - 34.0 pg   MCHC 29.7 (*) 30.0 - 36.0 g/dL   RDW 24.4 (*) 01.0 - 27.2 %   Platelets 269  150 - 400 K/uL  RENAL FUNCTION PANEL     Status: Abnormal   Collection Time    06/03/13  4:55 AM      Result Value Range   Sodium 137  135 - 145 mEq/L   Potassium 3.9  3.5 - 5.1 mEq/L   Chloride 98  96 - 112 mEq/L   CO2 26  19 - 32 mEq/L   Glucose, Bld 81  70 - 99 mg/dL   BUN 35 (*) 6 - 23 mg/dL   Comment: DELTA CHECK NOTED   Creatinine, Ser 6.06 (*) 0.50 - 1.35 mg/dL   Comment: REPEATED TO VERIFY   Calcium 8.6  8.4 - 10.5 mg/dL   Phosphorus 4.2  2.3 - 4.6 mg/dL   Albumin 2.0 (*) 3.5 - 5.2 g/dL   GFR calc non Af Amer 8 (*) >90 mL/min   GFR calc Af Amer 9 (*) >90 mL/min   Comment:            The eGFR has been calculated     using the CKD EPI equation.     This calculation has not been     validated in all clinical     situations.     eGFR's persistently     <90 mL/min signify     possible Chronic Kidney Disease.  PROTIME-INR     Status: Abnormal   Collection Time    06/03/13  4:55 AM      Result Value Range   Prothrombin Time 24.4 (*) 11.6 - 15.2 seconds   INR 2.28 (*) 0.00 - 1.49   No results found.  Post Admission Physician Evaluation: 1. Functional deficits secondary  to deconditioning after multiple medical issues. 2. Patient is admitted to receive collaborative, interdisciplinary care between the physiatrist, rehab nursing staff, and therapy team. 3. Patient's level of medical complexity and substantial therapy needs in context of that medical necessity cannot be provided at a lesser intensity of care such as a  SNF. 4. Patient has experienced substantial functional loss from his/her baseline which was documented above under the "Functional History" and "Functional Status" headings.  Judging by the patient's diagnosis, physical exam, and functional history, the patient has potential for functional progress which will result in measurable gains while on inpatient rehab.  These gains will be of substantial and practical use upon discharge  in facilitating mobility and self-care at the household level. 5. Physiatrist will provide 24 hour management of medical needs as well as oversight of the therapy plan/treatment and provide guidance as appropriate regarding the interaction of the two. 6. 24 hour rehab nursing will assist with bladder management, bowel management, safety, skin/wound care, disease management, medication administration, pain management and patient education  and help integrate therapy concepts, techniques,education, etc. 7. PT will assess and treat for/with: Lower extremity strength, range of motion, stamina, balance, functional mobility, safety, adaptive techniques and equipment, education.   Goals are: mod I. 8. OT will assess and treat for/with: ADL's, functional mobility, safety, upper extremity strength, adaptive techniques and equipment, education.   Goals are: mod I. 9. SLP will assess and treat for/with: cognition, communication.  Goals are: mod I to supervision. 10. Case  Management and Social Worker will assess and treat for psychological issues and discharge planning. 11. Team conference will be held weekly to assess progress toward goals and to determine barriers to discharge. 12. Patient will receive at least 3 hours of therapy per day at least 5 days per week. 13. ELOS: 7-10 days       14. Prognosis:  good   Medical Problem List and Plan: 1. DVT Prophylaxis/Anticoagulation: Pharmaceutical: Coumadin 2. Pain Management:  Will discontinue hydrocodone and use tylenol prn for pain.  3.  Mood:  Has anxiety regarding renal status. Family reports confusion/lethargy past HD sessions. Will monitor and offer ego support. LCSW to follow for evaluation.  4. Neuropsych: This patient is capable of making decisions on his own behalf. 5. Acute on Chronic renal failure: Continues with poor urine output. Check daily weights, strict I & O. Continue renal diet. HD--T,T,Sa. 6. Anemia of chronic disease: On nulecit IV for n ow. 7. Atrial Fibrillation: Monitor with bid checks. Amiodarone d/c and coreg increased today.  Monitor for bradycardia.  8. Peripheral edema: will order TEDs for use when OOB. 9. Constipation: Use sorbitol today. Set bowel program.  Not on fluid restrictions currently.  Ranelle Oyster, MD, [facility] Physical Medicine & Rehabilitation  06/03/2013

## 2013-06-04 ENCOUNTER — Inpatient Hospital Stay (HOSPITAL_COMMUNITY): Payer: Medicare Other

## 2013-06-04 ENCOUNTER — Inpatient Hospital Stay (HOSPITAL_COMMUNITY): Payer: Medicare Other | Admitting: Occupational Therapy

## 2013-06-04 DIAGNOSIS — R5381 Other malaise: Secondary | ICD-10-CM

## 2013-06-04 DIAGNOSIS — I2510 Atherosclerotic heart disease of native coronary artery without angina pectoris: Secondary | ICD-10-CM

## 2013-06-04 LAB — CBC
HCT: 28.2 % — ABNORMAL LOW (ref 39.0–52.0)
Hemoglobin: 8.3 g/dL — ABNORMAL LOW (ref 13.0–17.0)
MCH: 25.6 pg — ABNORMAL LOW (ref 26.0–34.0)
MCHC: 29.4 g/dL — ABNORMAL LOW (ref 30.0–36.0)

## 2013-06-04 LAB — RENAL FUNCTION PANEL
Albumin: 1.9 g/dL — ABNORMAL LOW (ref 3.5–5.2)
BUN: 53 mg/dL — ABNORMAL HIGH (ref 6–23)
BUN: 59 mg/dL — ABNORMAL HIGH (ref 6–23)
CO2: 27 mEq/L (ref 19–32)
Calcium: 8.5 mg/dL (ref 8.4–10.5)
Chloride: 101 mEq/L (ref 96–112)
Creatinine, Ser: 9.24 mg/dL — ABNORMAL HIGH (ref 0.50–1.35)
Glucose, Bld: 89 mg/dL (ref 70–99)
Phosphorus: 5.1 mg/dL — ABNORMAL HIGH (ref 2.3–4.6)
Potassium: 4.3 mEq/L (ref 3.5–5.1)

## 2013-06-04 LAB — GLUCOSE, CAPILLARY
Glucose-Capillary: 117 mg/dL — ABNORMAL HIGH (ref 70–99)
Glucose-Capillary: 86 mg/dL (ref 70–99)

## 2013-06-04 LAB — PROTIME-INR: INR: 2.14 — ABNORMAL HIGH (ref 0.00–1.49)

## 2013-06-04 MED ORDER — WARFARIN SODIUM 2.5 MG PO TABS
2.5000 mg | ORAL_TABLET | Freq: Once | ORAL | Status: AC
  Administered 2013-06-04: 2.5 mg via ORAL
  Filled 2013-06-04: qty 1

## 2013-06-04 MED ORDER — HEPARIN SODIUM (PORCINE) 1000 UNIT/ML IJ SOLN
4600.0000 [IU] | Freq: Once | INTRAMUSCULAR | Status: DC
  Filled 2013-06-04: qty 4.6

## 2013-06-04 NOTE — Progress Notes (Signed)
Patient information reviewed and entered into eRehab system by Kier Smead, RN, CRRN, PPS Coordinator.  Information including medical coding and functional independence measure will be reviewed and updated through discharge.     Per nursing patient was given "Data Collection Information Summary for Patients in Inpatient Rehabilitation Facilities with attached "Privacy Act Statement-Health Care Records" upon admission.  

## 2013-06-04 NOTE — Plan of Care (Signed)
Problem: Food- and Nutrition-Related Knowledge Deficit (NB-1.1) Goal: Nutrition education Formal process to instruct or train a patient/client in a skill or to impart knowledge to help patients/clients voluntarily manage or modify food choices and eating behavior to maintain or improve health. Outcome: Progressing Nutrition Education Note  RD consulted for Renal Education. Attempted to meet with pt this afternoon, however very sleepy after rehab.  Provided written recommended serving sizes specifically determined for patient's current nutritional status.  Will continue to meet with pt to explain why diet restrictions are needed.  Input from Renal on extent of diet restriction would be greatly appreciated as pt has acute-on-chronic with expected return in function.  Restriction would be beneficial in setting of refusing HD with electrolyte disturbance.   RD will return for ongoing education.  Body mass index is 29.36 kg/(m^2). Pt meets criteria for overweight based on current BMI.  Current diet order is Dysphagia 3, patient is consuming approximately 50% of meals at this time. Labs and medications reviewed.  No further nutrition interventions warranted at this time. RD contact information provided. If additional nutrition issues arise, please re-consult RD.  Loyce Dys, MS RD LDN Clinical Inpatient Dietitian Pager: [phone] Weekend/After hours pager: [phone]

## 2013-06-04 NOTE — Evaluation (Signed)
Evaluation note and session notes reviewed and all accurately reflect assessment & plan and treatment sessions.  

## 2013-06-04 NOTE — Evaluation (Signed)
Occupational Therapy Assessment and Plan & Session Notes  Patient Details  Name: ANGELINA NEECE MRN: [id number] Date of Birth: [birth year]  OT Diagnosis: acute pain, cognitive deficits and muscle weakness (generalized) Rehab Potential: Rehab Potential: Good ELOS: 7-10 days   Today's Date: 06/04/2013  Problem List:  Patient Active Problem List   Diagnosis Date Noted  . Physical deconditioning 06/04/2013  . Acute on chronic renal failure 06/04/2013  . S/P CABG x 4 06/02/2013  . Acute renal failure 06/02/2013  . Acute respiratory failure 05/13/2013  . Acute on chronic combined systolic and diastolic CHF, NYHA class 4 05/13/2013  . HCAP (healthcare-associated pneumonia) 05/13/2013  . NSTEMI (non-ST elevated myocardial infarction) 05/13/2013    Past Medical History:  Past Medical History  Diagnosis Date  . Cancer   . Hypertension   . Collagen vascular disease   . Anginal pain   . Depression   . Arthritis   . Peripheral edema     chronic/wears TEDs  . 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 Eye surgery    . Coronary artery bypass graft N/A 05/13/2013    Procedure: CORONARY ARTERY BYPASS GRAFTING (CABG);  Surgeon: Alleen Borne, MD;  Location: [facility] OR;  Service: Open Heart Surgery;  Laterality: N/A;  . Insertion of dialysis catheter N/A 05/26/2013    Procedure: INSERTION OF DIALYSIS CATHETER;  Surgeon: Sherren Kerns, MD;  Location: [facility] OR;  Service: Vascular;  Laterality: N/A;  right IJ    Assessment & Plan Clinical Impression: KAMDEN STANISLAW is a 79 y.o. male with PMhx significant for HTN, RA, hypothyroidism along with diffuse vascular dz to include CAD, PAD, AAA and common iliac artery dissection. He also has hx of atrial flutter and CKD --Cr 1.77 when admitted on 7/9 with CP and recurrent a flutter with positive troponin. His hospital course progressed quickly with him needing cardiac cath on 7/9  and eventually semi emergent CABG that same day. Post op with hypotension and declining renal function with decrease in UOP. Noted to be volume overloaded as well as somnolence on 07/14. Renal consulted and patient failed diuretic challenge requiring dialysis support. Started on IV heparin for persistent A Fib.ST evaluation done 07/18 due to concerns of dysphagia and FEES done mild oropharyngeal stage dysphagia--D3, nectar liquids recommended due to intermittent lethargy and deconditioning--advanced to thins. HD ongoing but limited due to low BP on TTS schedule with hopes of recovery in the future. IJ placed on 07/22 as he due to lack of UOP. He started developing lethargy as well as dizziness and nausea with mobility for the past few days. No orthostatic hypotension and amiodarone discontinued to see if symptoms would improve. Continues with bouts of confusion and minimal urine output. Renal monitoring to see if patient with Back in atrial fibrillation with heart rates 100-120s. Pacer wires discontinued today. To continue on coumadin for A Fib with INR goal 2-3. Therapies ongoing and patient deconditioned. CIR recommended by therapy team and patient admitted today. Patient transferred to CIR on 06/03/2013 .    Patient currently requires min assist with basic self-care skills secondary to muscle weakness, decreased cardiorespiratoy endurance and decreased oxygen support, decreased awareness and decreased memory and decreased standing balance, decreased postural control and difficulty maintaining precautions.  Prior to hospitalization, patient could complete BADL's/IADL's with independent .  Patient will benefit from skilled intervention to increase independence with  basic self-care skills prior to discharge home with care partner.  Anticipate patient will require 24 hour supervision and follow up home health.  OT - End of Session Activity Tolerance: Tolerates 10 - 20 min activity with multiple rests Endurance  Deficit: Yes Endurance Deficit Description: secondary to bedrest previous ~3 wks; decreased oxygen support OT Assessment Rehab Potential: Good Barriers to Discharge:  (None known at this time) OT Plan OT Intensity: Minimum of 1-2 x/day, 45 to 90 minutes OT Frequency: 5 out of 7 days OT Duration/Estimated Length of Stay: 7-10 days OT Treatment/Interventions: Balance/vestibular training;Cognitive remediation/compensation;Community reintegration;Discharge planning;Disease mangement/prevention;DME/adaptive equipment instruction;Functional mobility training;Neuromuscular re-education;Pain management;Patient/family education;Psychosocial support;Self Care/advanced ADL retraining;Skin care/wound managment;Therapeutic Activities;Therapeutic Exercise;UE/LE Strength taining/ROM;UE/LE Coordination activities OT Recommendation Recommendations for Other Services:  (None at this time.) Patient destination: Home Follow Up Recommendations: Home health OT Equipment Recommended:  (TBD)  Precautions/Restrictions  Precautions Precautions: Sternal;Fall Precaution Comments: Pt did not recall having sternal precautions Restrictions Weight Bearing Restrictions: No  General Chart Reviewed: Yes Family/Caregiver Present: Yes  Vital Signs Therapy Vitals Temp: 98.2 F (36.8 C) Temp src: Oral Pulse Rate: 67 Resp: 20 BP: 125/67 mmHg Patient Position, if appropriate: Lying Oxygen Therapy SpO2: 100 % O2 Device: Nasal cannula O2 Flow Rate (L/min): 2 L/min Pulse Oximetry Type: Continuous  Pain Pain Assessment Pain Assessment: 0-10 Pain Score: 6  Pain Type: Acute pain Pain Location: Neck Pain Descriptors / Indicators: Sore Pain Intervention(s): Other (Comment) (neck stretches)  Home Living/Prior Functioning Home Living Available Help at Discharge: Family;Available 24 hours/day (son temporarily moving in with pt; took FMLA) Type of Home: House Home Access: Stairs to enter [employer] of  Steps: 3 in front; 2 in back Home Layout: One level  Lives With: Spouse IADL History Homemaking Responsibilities: Yes Meal Prep Responsibility: Secondary Laundry Responsibility: Primary Shopping Responsibility: Primary Current License: Yes Mode of Transportation: Car Prior Function Comments: Was driving PTA.  ADL: See FIM  Vision/Perception  Vision - History Baseline Vision: Other (comment) (wears glasses when driving) Visual History: Other (comment) (hx of bilateral cataracts; sx ~6 yrs ago) Patient Visual Report: No change from baseline Vision - Assessment Eye Alignment: Within Functional Limits Perception Perception: Within Functional Limits Praxis Praxis: Intact   Cognition Overall Cognitive Status: Within Functional Limits for tasks assessed Arousal/Alertness: Awake/alert Orientation Level: Oriented X4 Attention: Focused Focused Attention: Appears intact Memory: Impaired Memory Impairment: Decreased recall of new information Awareness: Appears intact Problem Solving: Appears intact Safety/Judgment: Appears intact  Sensation Sensation Light Touch: Appears Intact Stereognosis: Appears Intact Hot/Cold: Appears Intact Proprioception: Appears Intact Coordination Gross Motor Movements are Fluid and Coordinated: Yes Fine Motor Movements are Fluid and Coordinated: Yes  Motor: See Eval navigator  Mobility: See Eval navigator  Trunk/Postural Assessment: See Eval navigator  Balance: See Eval Navigator  Extremity/Trunk Assessment RUE Assessment RUE Assessment: Within Functional Limits LUE Assessment LUE Assessment: Within Functional Limits  FIM:  FIM - Eating Eating Activity: 0: Activity did not occur FIM - Grooming Grooming: 0: Activity did not occur FIM - Bathing Bathing Steps Patient Completed: Chest;Right Arm;Left Arm;Front perineal area;Abdomen;Right upper leg;Left upper leg;Right lower leg (including foot);Buttocks;Left lower leg (including  foot) Bathing: 4: Min-Patient completes 8-9 50f 10 parts or 75+ percent (min assist sit<>stand) FIM - Upper Body Dressing/Undressing Upper body dressing/undressing steps patient completed: Thread/unthread right sleeve of front closure shirt/dress;Thread/unthread left sleeve of front closure shirt/dress;Button/unbutton shirt Upper body dressing/undressing: 4: Min-Patient completed 75 plus % of tasks FIM - Lower Body Dressing/Undressing Lower body dressing/undressing steps patient  completed: Thread/unthread right underwear leg;Thread/unthread left underwear leg;Pull underwear up/down;Thread/unthread right pants leg;Thread/unthread left pants leg;Pull pants up/down;Don/Doff right sock;Don/Doff left sock (min assist sit<>stand) Lower body dressing/undressing: 4: Min-Patient completed 75 plus % of tasks (min assist sit<>stand) FIM - Toileting Toileting: 0: Activity did not occur FIM - Press photographer Assistive Devices: Arm rests;HOB elevated Bed/Chair Transfer: 3: Supine > Sit: Mod A (lifting assist/Pt. 50-74%/lift 2 legs;4: Bed > Chair or W/C: Min A (steadying Pt. > 75%) FIM - Diplomatic Services operational officer Devices: Elevated toilet seat;Grab bars Toilet Transfers: 4-To toilet/BSC: Min A (steadying Pt. > 75%);4-From toilet/BSC: Min A (steadying Pt. > 75%) FIM - Tub/Shower Transfers Tub/shower Transfers: 0-Activity did not occur or was simulated   Refer to Care Plan for Long Term Goals  Recommendations for other services: None at this time.  Discharge Criteria: Patient will be discharged from OT if patient refuses treatment 3 consecutive times without medical reason, if treatment goals not met, if there is a change in medical status, if patient makes no progress towards goals or if patient is discharged from hospital.  The above assessment, treatment plan, treatment alternatives and goals were discussed and mutually agreed upon: by patient and by family. Patient's  wife and son present for evaluation/tx session. --------------------------------------------------------------------------------------------------------------------- Session Note #1: Time: 0900-1000 (60 mins) Pt reported 6/10 "pain/soreness" in posterior neck. OT instructed pt on neck ex's to relieve this soreness.  Initial one-on-one OT eval complete. OT checked pt's O2 stats (=76%) and educated pt on pursed-lip breathing technique. Pt->EOB then transferred->w/c using stand pivot (min assist). Pt educated on sternal precautions (pt unable to recall he had any) and safety with standing (pt places hands on legs; OT ensured pt this is ok as long as he doesn't push through his arms). Pt engaged in ADL retraining from w/c level at sink. Pt sit<>stand for peri care and pulling up underwear/pants. RN notified to check pt's skin/change dressing to backside and pt sit<>stand for this. From here, pt transferred w/c<>elevated toilet using stand pivot (min assist). Pt requires min-mod assist to stand and steady assist for dynamic standing balance. Pt on 2L O2 throughout session. At end of tx session, pt seated in w/c with call bell within reach. Pt's wife present for entire tx session.  Session Note #2: Time: 1400-1430 (30 mins) Pt with no report of pain.  Upon entering room, pt seated in recliner. Pt stated he would "get sick" if he did therapy. With encouragement from OT, pt decided to participate. Pt ambulated with R/W->w/c. OT checked pt's BP secondary to c/o not feeling well (BP=116/68; O2=96%). OT pushed pt->ADL BR. Pt ambulated with R/W->BR and transferred<>tub/shower. Pt walked forwards in tub and backward out of tub secondary to pt refusing to transfer using tub bench. Pt required min assist for this. Pt ambulated with R/W->w/c. OT pushed pt->room and from here pt ambulated->bed requiring mod assist sit->supine. Pt stated he is not happy about his ELOS of 10 days and feels he can go home and be "just fine"  in a couple days. Pt requires max v.c.'s for adherence to sternal precautions during sit<>stand. Pt on 2L O2 throughout tx session. At end of tx session, pt supine in bed. BA on. Social worker notified of pt's wishes to speak with her.  Izzabell Klasen 06/04/2013, 10:22 AM

## 2013-06-04 NOTE — Evaluation (Signed)
Physical Therapy Assessment and Plan  Patient Details  Name: [name] MRN: [id number] Date of Birth: 01-Apr-1934  PT Diagnosis: Abnormality of gait, Difficulty walking and Muscle weakness Rehab Potential: Good ELOS: 10 days    Today's Date: 06/04/2013 Time: 1000-1059 Time Calculation (min): 59 min  Problem List:  Patient Active Problem List   Diagnosis Date Noted  . Physical deconditioning 06/04/2013  . Acute on chronic renal failure 06/04/2013  . S/P CABG x 4 06/02/2013  . Acute renal failure 06/02/2013  . Acute respiratory failure 05/13/2013  . Acute on chronic combined systolic and diastolic CHF, NYHA class 4 05/13/2013  . HCAP (healthcare-associated pneumonia) 05/13/2013  . NSTEMI (non-ST elevated myocardial infarction) 05/13/2013    Past Medical History:  Past Medical History  Diagnosis Date  . Cancer   . Hypertension   . Collagen vascular disease   . Anginal pain   . Depression   . Arthritis   . Peripheral edema     chronic/wears TEDs  . 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 Eye surgery    . Coronary artery bypass graft N/A 05/13/2013    Procedure: CORONARY ARTERY BYPASS GRAFTING (CABG);  Surgeon: Alleen Borne, MD;  Location: [facility] OR;  Service: Open Heart Surgery;  Laterality: N/A;  . Insertion of dialysis catheter N/A 05/26/2013    Procedure: INSERTION OF DIALYSIS CATHETER;  Surgeon: Sherren Kerns, MD;  Location: [facility] OR;  Service: Vascular;  Laterality: N/A;  right IJ    Assessment & Plan Clinical Impression: Patient is a 79 y.o. year old male with recent admission to the hospital on 7/9 with CP and recurrent a flutter with positive troponin. His hospital course progressed quickly with him needing cardiac cath on 7/9 and eventually semi emergent CABG that same day. Post op with hypotension and declining renal function with decrease in UOP. Noted to be volume overloaded as well  as somnolence on 07/14. Renal consulted and patient failed diuretic challenge requiring dialysis support. Started on IV heparin for persistent A Fib.ST evaluation done 07/18 due to concerns of dysphagia and FEES done mild oropharyngeal stage dysphagia--D3, nectar liquids recommended due to intermittent lethargy and deconditioning--advanced to thins. HD ongoing but limited due to low BP on TTS schedule with hopes of recovery in the future. IJ placed on 07/22 as he due to lack of UOP.   PMhx significant for HTN, RA, hypothyroidism along with diffuse vascular dz to include CAD, PAD, AAA and common iliac artery dissection. He also has hx of atrial flutter and CKD --Cr 1.77  He started developing lethargy as well as dizziness and nausea with mobility for the past few days. No orthostatic hypotension and amiodarone discontinued to see if symptoms would improve. Continues with bouts of confusion and minimal urine output. Renal monitoring to see if patient with Back in atrial fibrillation with heart rates 100-120s. Pacer wires discontinued today. To continue on coumadin for A Fib with INR goal 2-3. Patient transferred to CIR on 06/03/2013 .   Patient currently requires min with mobility secondary to muscle weakness, decreased cardiorespiratoy endurance and decreased oxygen support and decreased standing balance and difficulty maintaining precautions.  Prior to hospitalization, patient was independent  with mobility and lived with Spouse in a House.  Home access is 2  Stairs to enter.  Patient will benefit from skilled PT intervention to maximize safe functional mobility,  minimize fall risk and decrease caregiver burden for planned discharge home with 24 hour supervision.  Anticipate patient will benefit from follow up HH at discharge.  PT - End of Session Activity Tolerance: Decreased this session Endurance Deficit: Yes Endurance Deficit Description: due to generalized weakness and deconditioning due to bed rest   PT Assessment Rehab Potential: Good Barriers to Discharge: Decreased caregiver support PT Plan PT Intensity: Minimum of 1-2 x/day ,45 to 90 minutes PT Frequency: 5 out of 7 days PT Duration Estimated Length of Stay: 10 days  PT Treatment/Interventions: Ambulation/gait training;Balance/vestibular training;Cognitive remediation/compensation;Community reintegration;Discharge planning;Disease management/prevention;DME/adaptive equipment instruction;Functional mobility training;Neuromuscular re-education;Pain management;Patient/family education;Psychosocial support;Skin care/wound management;Stair training;Therapeutic Activities;Therapeutic Exercise;UE/LE Strength taining/ROM;UE/LE Coordination activities;Wheelchair propulsion/positioning PT Recommendation Follow Up Recommendations: Home health PT Patient destination: Home Equipment Details: RW   Skilled Therapeutic Intervention Therapy session focused on functional transfers, gait, and therapeutic exercise. Pt performed stand pivot transfers from w/c<>mat table with min A with the use of a RW for balance. Pt requires mod A for supine > sit due to inability to use B/L UE to come to sit. Pt performed gait for 25 ft x 1 and 20 ft x 1 with min A-close S and use of RW for balance. TUG test  completed in 74 sec indicating high risk of falls (time > 13.5 sec) when performed with use of RW.  Pt requires VC's to maintain sternal precautions with functional transfers. Pt performed seated therapeutic exercises that consisted of ankle pumps and long arc quads for B/L LE strengthening to improve functional mobility and transfers. Pt was educated on HEP and performing weight shifting ever 15 minutes due to stage 2 sacral wound. Overall, pt presents with symptomatic orthostatic hypotension after performing bed mobility with a BP of 94/56 mmHg and HR: 75 BPM. Pt with previous episodes of BPPV when on acute care however no reports of "room spinning" dizziness just "light  headedness". PT to continue to assess the need for vestibular evaluation/follow-up care during rehab stay. No c/o pain during therapy session.   PT Evaluation Precautions/Restrictions Precautions Precautions: Sternal;Fall Precaution Comments:  (Pt able to recall 1/5 sternal precautions) Restrictions Weight Bearing Restrictions: Yes RUE Weight Bearing: Non weight bearing (Sternal precutions ) LUE Weight Bearing: Non weight bearing (sternal precautions) Home Living/Prior Functioning Home Living Available Help at Discharge: Family;Available 24 hours/day Type of Home: House Home Access: Stairs to enter [employer] of Steps: 3 in front; 2 in back Entrance Stairs-Rails: Right;Left Home Layout: One level  Lives With: Spouse (Unable to provide physical assistance for pt) Prior Function Level of Independence: Independent with basic ADLs;Independent with homemaking with ambulation;Independent with gait;Independent with transfers  Able to Take Stairs?: Reciprically Driving: Yes Vocation: Retired (Work for the city of [employer] ) Comments: Was driving PTA. Vision/Perception  Vision - History Baseline Vision: Other (comment) (wears glasses when driving ) Visual History: Other (comment) (hx of bilateral cataracts; sx ~6 yrs ago) Patient Visual Report: No change from baseline Vision - Assessment Eye Alignment: Within Functional Limits Perception Perception: Within Functional Limits Praxis Praxis: Intact  Cognition Overall Cognitive Status: Within Functional Limits for tasks assessed Arousal/Alertness: Awake/alert Orientation Level: Oriented X4 Attention: Focused Focused Attention: Appears intact Memory: Impaired Memory Impairment: Decreased recall of new information (as demonstrated with limited recall of sternal precautions ) Awareness: Appears intact Problem Solving: Appears intact Safety/Judgment: Appears intact Sensation Sensation Light Touch: Appears  Intact Stereognosis: Appears Intact Hot/Cold: Appears Intact Proprioception: Appears Intact Coordination Gross Motor Movements are Fluid and Coordinated: Yes  Fine Motor Movements are Fluid and Coordinated: Yes Motor  Motor Motor: Within Functional Limits Motor - Skilled Clinical Observations: Generalized weakness and deconditioning  Locomotion  Ambulation Ambulation/Gait Assistance: 4: Min guard Ambulation Distance (Feet): 25 Feet (25 ft x 1; 20 ft x1 ) Assistive device: Rolling walker Gait Gait: Yes Gait Pattern: Impaired Gait Pattern: Trunk flexed;Shuffle Gait velocity: decreased  Trunk/Postural Assessment  Cervical Assessment Cervical Assessment: Within Functional Limits (Pt reports stiffness ) Thoracic Assessment Thoracic Assessment: Within Functional Limits Lumbar Assessment Lumbar Assessment: Within Functional Limits Postural Control Postural Control: Within Functional Limits  Balance Balance Balance Assessed: Yes Standardized Balance Assessment Standardized Balance Assessment: Timed Up and Go Test Timed Up and Go Test TUG: Normal TUG Normal TUG (seconds): 74 (completed with RW and close S ) Static Sitting Balance Static Sitting - Level of Assistance: 6: Modified independent (Device/Increase time) Static Standing Balance Static Standing - Level of Assistance: 5: Stand by assistance Dynamic Standing Balance Dynamic Standing - Level of Assistance: 4: Min assist Extremity Assessment  RUE Assessment RUE Assessment: Within Functional Limits LUE Assessment LUE Assessment: Within Functional Limits RLE Assessment RLE Assessment: Exceptions to [facility] RLE Strength RLE Overall Strength: Deficits;Due to premorbid status (Hip OA) RLE Overall Strength Comments: hip flexion +3/5;knee flexion/extension-4/5; Ankle PF/DF:4/5 LLE Assessment LLE Assessment: Within Functional Limits LLE Strength LLE Overall Strength Comments: hip flexion 4/5;knee flexion/extension-4/5; Ankle  PF/DF:4/5  FIM:  FIM - Bed/Chair Transfer Bed/Chair Transfer Assistive Devices: Therapist, occupational: 4: Chair or W/C > Bed: Min A (steadying Pt. > 75%);4: Bed > Chair or W/C: Min A (steadying Pt. > 75%);5: Sit > Supine: Supervision (verbal cues/safety issues);3: Supine > Sit: Mod A (lifting assist/Pt. 50-74%/lift 2 legs FIM - Locomotion: Wheelchair Locomotion: Wheelchair: 0: Activity did not occur (UE sternal precautions) FIM - Locomotion: Ambulation Locomotion: Ambulation Assistive Devices: Designer, industrial/product Ambulation/Gait Assistance: 4: Min guard Locomotion: Ambulation: 1: Travels less than 50 ft with minimal assistance (Pt.>75%) FIM - Locomotion: Stairs Locomotion: Building control surveyor: Hand rail - 2 Locomotion: Stairs: 2: Up and Down 4 - 11 stairs with minimal assistance (Pt.>75%)   Refer to Care Plan for Long Term Goals  Recommendations for other services: None  Discharge Criteria: Patient will be discharged from PT if patient refuses treatment 3 consecutive times without medical reason, if treatment goals not met, if there is a change in medical status, if patient makes no progress towards goals or if patient is discharged from hospital.  The above assessment, treatment plan, treatment alternatives and goals were discussed and mutually agreed upon: by patient and by family  [name], Ileene Hutchinson 06/04/2013, 8:50 AM

## 2013-06-04 NOTE — Progress Notes (Signed)
Subjective/Complaints: Had a good night. Ready to get started in therapies. Denies pain. Breathing well A 12 point review of systems has been performed and if not noted above is otherwise negative.   Objective: Vital Signs: Blood pressure 125/67, pulse 67, temperature 98.2 F (36.8 C), temperature source Oral, resp. rate 20, weight 100.925 kg (222 lb 8 oz), SpO2 100.00%. No results found.  Recent Labs  06/02/13 0518 06/03/13 0455  WBC 8.3 7.7  HGB 7.7* 8.4*  HCT 26.4* 28.3*  PLT 282 269    Recent Labs  06/03/13 0455 06/04/13 0610  NA 137 140  K 3.9 4.3  CL 98 101  GLUCOSE 81 89  BUN 35* 53*  CREATININE 6.06* 8.62*  CALCIUM 8.6 8.5   CBG (last 3)   Recent Labs  06/03/13 1719 06/03/13 2047 06/04/13 0857  GLUCAP 78 103* 99    Wt Readings from Last 3 Encounters:  06/04/13 100.925 kg (222 lb 8 oz)  06/03/13 99.3 kg (218 lb 14.7 oz)  06/03/13 99.3 kg (218 lb 14.7 oz)    Physical Exam:  Constitutional: He is oriented to person, place, and time. He appears well-developed and well-nourished.  Keeps eyes closed  HENT:  Head: Normocephalic and atraumatic.  Eyes: Conjunctivae are normal. Pupils are equal, round, and reactive to light.  Neck: Normal range of motion. Neck supple. No thyromegaly present.  Cardiovascular: Normal rate. An irregularly irregular rhythm present. No murmur  Pulmonary/Chest: Effort normal and breath sounds normal. No respiratory distress. He has no wheezes.  Abdominal: Soft. Bowel sounds are normal. He exhibits no distension. There is no tenderness.  Musculoskeletal: He exhibits no edema and no tenderness. Right greater troch tender to touch.  Stasis changes BLE. Incisions BLE clean, dry And intact.  Neurological: He is oriented to person, place, and time. Appropriate conversationally. Used calendar to recall day. Able to follow simple commands without difficulty. Motor strength is 4/5 in left hip flexor knee extensor ankle dorsiflexor and  plantar flexor 3 minus right hip flexor, knee extensors, ankle dorsiflexor plantar flexor, 5/5 bilateral deltoid, bicep, tricep, grip. Decreased sensation right foot compared to left foot, but difference negligible   Assessment/Plan: 1. Functional deficits secondary to deconditioning after CABG/multiple medical issues which require 3+ hours per day of interdisciplinary therapy in a comprehensive inpatient rehab setting. Physiatrist is providing close team supervision and 24 hour management of active medical problems listed below. Physiatrist and rehab team continue to assess barriers to discharge/monitor patient progress toward functional and medical goals. FIM:                   Comprehension Comprehension Mode: Auditory Comprehension: 6-Follows complex conversation/direction: With extra time/assistive device  Expression Expression Mode: Verbal Expression: 6-Expresses complex ideas: With extra time/assistive device           Medical Problem List and Plan:  1. DVT Prophylaxis/Anticoagulation: Pharmaceutical: Coumadin  2. Pain Management: Will discontinue hydrocodone and use tylenol prn for pain.  3. Mood: Has anxiety regarding renal status. Family reports confusion/lethargy past HD sessions. Will monitor and offer ego support. LCSW to follow for evaluation.  4. Neuropsych: This patient is capable of making decisions on his own behalf.  5. Acute on Chronic renal failure: Continues with poor urine output. Check daily weights, strict I & O. Continue renal diet. HD--T,T,Sa.  6. Anemia of chronic disease: On nulecit IV for n ow.  7. Atrial Fibrillation: Monitor with bid checks. Amiodarone d/c and coreg increased today. Monitor for bradycardia.  8. Peripheral edema:  TEDs for use when OOB.  9. Constipation: PRN lax's. Set bowel program. Not on fluid restrictions currently  LOS (Days) 1 A FACE TO FACE EVALUATION WAS PERFORMED  Sulay Brymer T 06/04/2013 9:17 AM

## 2013-06-04 NOTE — Evaluation (Signed)
Reviewed and in agreement with evaluation assessment and treatment provided.  

## 2013-06-04 NOTE — Evaluation (Signed)
Speech Language Pathology Assessment and Plan  Patient Details  Name: [name] MRN: [id number] Date of Birth: [birth year]  SLP Diagnosis: Cognitive Impairments;Dysphagia  Rehab Potential: Good ELOS: 7-10 days   Today's Date: 06/04/2013 Time: 1300-1400 Time Calculation (min): 60 min  Problem List:  Patient Active Problem List   Diagnosis Date Noted  . Physical deconditioning 06/04/2013  . Acute on chronic renal failure 06/04/2013  . S/P CABG x 4 06/02/2013  . Acute renal failure 06/02/2013  . Acute respiratory failure 05/13/2013  . Acute on chronic combined systolic and diastolic CHF, NYHA class 4 05/13/2013  . HCAP (healthcare-associated pneumonia) 05/13/2013  . NSTEMI (non-ST elevated myocardial infarction) 05/13/2013   Past Medical History:  Past Medical History  Diagnosis Date  . Cancer   . Hypertension   . Collagen vascular disease   . Anginal pain   . Depression   . Arthritis   . Peripheral edema     chronic/wears TEDs  . 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 Eye surgery    . Coronary artery bypass graft N/A 05/13/2013    Procedure: CORONARY ARTERY BYPASS GRAFTING (CABG);  Surgeon: Alleen Borne, MD;  Location: [facility] OR;  Service: Open Heart Surgery;  Laterality: N/A;  . Insertion of dialysis catheter N/A 05/26/2013    Procedure: INSERTION OF DIALYSIS CATHETER;  Surgeon: Sherren Kerns, MD;  Location: [facility] OR;  Service: Vascular;  Laterality: N/A;  right IJ    Assessment / Plan / Recommendation Clinical Impression  Pt is a 79 y.o. male with PMhx significant for HTN, RA, hypothyroidism along with diffuse vascular dz to include CAD, PAD, AAA and common iliac artery dissection. He also has hx of atrial flutter and CKD --Cr 1.77 when admitted on 7/9 with CP and recurrent a flutter with positive troponin. His hospital course progressed quickly with him needing cardiac cath on 7/9 and  eventually semi emergent CABG that same day. Post op with hypotension and declining renal function with decrease in UOP. Noted to be volume overloaded as well as somnolence on 07/14. Renal consulted and patient failed diuretic challenge requiring dialysis support. Started on IV heparin for persistent A Fib.ST evaluation done 07/18 due to concerns of dysphagia and FEES done mild oropharyngeal stage dysphagia--D3, nectar liquids recommended due to intermittent lethargy and deconditioning--advanced to thins. HD ongoing but limited due to low BP on TTS schedule with hopes of recovery in the future. IJ placed on 07/22 as he due to lack of UOP. He started developing lethargy as well as dizziness and nausea with mobility for the past few days. No orthostatic hypotension and amiodarone discontinued to see if symptoms would improve. Continues with bouts of confusion and minimal urine output. Renal monitoring to see if patient with Back in atrial fibrillation with heart rates 100-120s. Pacer wires discontinued today. To continue on coumadin for A Fib with INR goal 2-3. Therapies ongoing and patient deconditioned. CIR recommended by therapy team and patient admitted 7/30. Today pt appears to be tolerating Dys. 3 textures and thin liquids with no overt s/s of aspiration observed, and presents with cognitive deficits in the areas of orientation, intellectual awareness, sustained attention, and basic problem solving. Pt would benefit from skilled SLP services for upgraded texture trials and cognitive treatment to address the areas above, in order to increase functional independence and decrease caregiver burden.  SLP Assessment  Patient will need skilled Speech Lanaguage Pathology Services during CIR admission    Recommendations  Diet Recommendations: Dysphagia 3 (Mechanical Soft);Thin liquid Liquid Administration via: Cup Medication Administration: Whole meds with puree Supervision: Patient able to self  feed;Intermittent supervision to cue for compensatory strategies Compensations: Small sips/bites;Slow rate Postural Changes and/or Swallow Maneuvers: Seated upright 90 degrees Oral Care Recommendations: Oral care BID Patient destination: Home Follow up Recommendations: Outpatient SLP Equipment Recommended: None recommended by SLP    SLP Frequency 5 out of 7 days   SLP Treatment/Interventions Cognitive remediation/compensation;Cueing hierarchy;Dysphagia/aspiration precaution training;Environmental controls;Functional tasks;Internal/external aids;Medication managment;Patient/family education;Therapeutic Activities;Therapeutic Exercise    Pain Pain Assessment Pain Assessment: No/denies pain Prior Functioning Type of Home: House  Lives With: Spouse Available Help at Discharge: Family;Available 24 hours/day Vocation: Retired (worked for the city of [employer])  Short Term Goals: Week 1: SLP Short Term Goal 1 (Week 1): Pt will use external aids to demonstrate orientation to person, time, location, and situation with Mod I. SLP Short Term Goal 2 (Week 1): Pt will demonstrate basic problem solving during functional task with Min cues. SLP Short Term Goal 3 (Week 1): Pt will sustain attention to functional task for 10 min with Min cues. SLP Short Term Goal 4 (Week 1): Pt will demonstrate adequate medication management using external aids with Min cues. SLP Short Term Goal 5 (Week 1): Pt will consume regular textures with thin liquids with no s/s of aspiration.  See FIM for current functional status Refer to Care Plan for Long Term Goals  Recommendations for other services: None  Discharge Criteria: Patient will be discharged from SLP if patient refuses treatment 3 consecutive times without medical reason, if treatment goals not met, if there is a change in medical status, if patient makes no progress towards goals or if patient is discharged from hospital.  The above assessment, treatment plan,  treatment alternatives and goals were discussed and mutually agreed upon: by patient  Maxcine Ham 06/04/2013, 2:37 PM  Maxcine Ham, M.A. CCC-SLP

## 2013-06-04 NOTE — Progress Notes (Signed)
[facility] ROUNDING NOTE  Subjective:  Transferred to Rehab floor yesterday - wife states glad did not have to go to [facility] Sitting at sink washing up Not complaining of dizziness right now  Objective Vital signs in last 24 hours: Filed Vitals:   06/03/13 1439 06/04/13 0500 06/04/13 0533 06/04/13 0815  BP: 107/75  102/44 125/67  Pulse:   66 67  Temp: 97 F (36.1 C)  98.3 F (36.8 C) 98.2 F (36.8 C)  TempSrc: Oral  Oral Oral  Resp:   17 20  Weight:  99.882 kg (220 lb 3.2 oz) 100.925 kg (222 lb 8 oz)   SpO2: 95%  100% 100%   Weight change:   Intake/Output Summary (Last 24 hours) at 06/04/13 8295 Last data filed at 06/04/13 0800  Gross per 24 hour  Intake    480 ml  Output      0 ml  Net    480 ml   Physical Exam:  BP 125/67  Pulse 67  Temp(Src) 98.2 F (36.8 C) (Oral)  Resp 20  Wt 100.925 kg (222 lb 8 oz)  BMI 29.36 kg/m2  SpO2 100% Awake and appropriate, working with OT Not nauseous right now Lungs clear posteriorly Sternotomy scar healing  Abd soft and not tender Leg incisions healing No edema LE's Right IJ tunnelled HD cath dsg in place (05/26/13)  Labs: Basic Metabolic Panel:  Recent Labs Lab 05/29/13 0420 05/30/13 0535 05/31/13 0425 06/01/13 0500 06/02/13 0518 06/03/13 0455 06/04/13 0610  NA 136 134* 136 135 136 137 140  K 4.3 4.7 3.9 4.1 4.5 3.9 4.3  CL 97 95* 97 97 98 98 101  CO2 29 23 30 24 22 26 27   GLUCOSE 100* 99 93 83 91 81 89  BUN 37* 62* 37* 56* 72* 35* 53*  CREATININE 4.55* 6.68* 4.89* 6.81* 9.13* 6.06* 8.62*  CALCIUM 8.0* 8.1* 8.1* 8.1* 8.5 8.6 8.5  PHOS 6.0* 6.0* 4.2 5.2* 6.2* 4.2 5.9*   Results for [name], [name] (MRN [id number]) as of 06/01/2013 07:32   Ref. Range  05/31/2013 10:33   Iron  Latest Range: 42-135 ug/dL  26 (L)   UIBC  Latest Range: 125-400 ug/dL  846   TIBC  Latest Range: 215-435 ug/dL  962   Saturation Ratios  Latest Range: 20-55 %  10 (L)     Recent Labs Lab 06/02/13 0518 06/03/13 0455  06/04/13 0610  ALBUMIN 1.9* 2.0* 1.9*    Recent Labs Lab 05/31/13 0425 06/01/13 0500 06/02/13 0518 06/03/13 0455  WBC 12.0* 10.3 8.3 7.7  HGB 8.2* 8.2* 7.7* 8.4*  HCT 26.2* 26.7* 26.4* 28.3*  MCV 84.0 84.0 85.2 86.3  PLT 231 251 282 269   Lab Results  Component Value Date   INR 2.28* 06/03/2013   INR 2.66* 06/02/2013   INR 2.40* 06/01/2013    Medications:   . atorvastatin  20 mg Oral q1800  . calcium acetate  667 mg Oral TID WC  . carvedilol  3.125 mg Oral BID WC  . [START ON 06/09/2013] darbepoetin (ARANESP) injection - NON-DIALYSIS  60 mcg Subcutaneous Q Tue-1800  . feeding supplement  1 Container Oral TID BM  . ferric gluconate (FERRLECIT/NULECIT) IV  125 mg Intravenous Q T,Th,Sa-HD  . insulin aspart  0-5 Units Subcutaneous QHS  . insulin aspart  0-9 Units Subcutaneous TID WC  . levothyroxine  100 mcg Oral QAC breakfast  . multivitamin  1 tablet Oral Daily  . pantoprazole  40  mg Oral Daily  . senna-docusate  2 tablet Oral QHS  . sodium chloride  3 mL Intravenous Q12H  . Warfarin - Pharmacist Dosing Inpatient   Does not apply q1800  acetaminophen, albuterol, aluminum hydroxide, bisacodyl, diphenhydrAMINE, guaiFENesin-dextromethorphan, HYDROcodone-acetaminophen, ondansetron (ZOFRAN) IV, ondansetron, sodium chloride, traZODone  I  have reviewed scheduled and prn medications.   ASSESSMENT/RECOMMENDATIONS   79yo WM with baseline CKD (1.5-1.7) with oligoanuric ischemic ATN post CP/cardiogenic shock, cath, CABG (7/9), requiring renal replacement therapy since 7/17 with CRRT, transitioned to intermittent HD on 7/21, now with TDC placed 7/22. On TTS schedule with hopes of recovery of function.   AKI on CKD Dialysis dependent since 05/21/13.   Remains oliguric.  Too early to call ESRD.  Continue TTS schedule until evidence of recovery.  INext HD today  4 hours 2K bath Weights all over the place so unclear EDW (weigh pre and post in HD and in absence of edema, keep vol even  today  SP CABG Per TCTS rehab On coumadin Amio d/c'd   Anemia  On aranesp 60 QTues.  Tsat low.  Repleting with HD X 10 doses of ferrlecit (started 7/29)   A fib, now SR.  Transient episode in HD 7/29 - back in NSR now  Deconditioning  Rehab  CKD-MBD Now on binders Increase 2 TID w/meals No PTH data (ordered)   Camille Bal, MD [facility] [phone] pager 06/04/2013, 9:22 AM

## 2013-06-04 NOTE — Progress Notes (Signed)
ANTICOAGULATION CONSULT NOTE - Initial Consult  Pharmacy Consult for Coumadin Indication: atrial fibrillation  Allergies  Allergen Reactions  . 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   Medical History: Past Medical History  Diagnosis Date  . Cancer   . Hypertension   . Collagen vascular disease   . Anginal pain   . Depression   . Arthritis   . Peripheral edema     chronic/wears TEDs  . 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 Pt. Is transferred to inpatient rehab on 7/30. Pharmacy is consulted to dose coumadin for hx of DVT and afib. INR 2.14 today. No bleeding noted per chart.  Goal of Therapy:  INR 2-3 Monitor platelets by anticoagulation protocol: Yes   Plan:  - Coumadin 2.5 mg PO x 1 - f/u daily INR  Bayard Hugger, PharmD, BCPS  Clinical Pharmacist  Pager: [phone]  06/04/2013,2:11 PM

## 2013-06-04 NOTE — Progress Notes (Signed)
Order modified by Dr. Eliott Nine pre hemodialysis Tx. Not noticed by me until half Tx over. Dr. Marisue Humble called and informed that 658cc fluid removed during Tx instead of being kept even. Pt did have some drops in BP during procedure and was tachycardiac which has been his norm during Hemodialysis Tx.

## 2013-06-05 ENCOUNTER — Inpatient Hospital Stay (HOSPITAL_COMMUNITY): Payer: Medicare Other | Admitting: *Deleted

## 2013-06-05 ENCOUNTER — Inpatient Hospital Stay (HOSPITAL_COMMUNITY): Payer: Medicare Other

## 2013-06-05 ENCOUNTER — Inpatient Hospital Stay (HOSPITAL_COMMUNITY): Payer: Medicare Other | Admitting: Occupational Therapy

## 2013-06-05 DIAGNOSIS — Z992 Dependence on renal dialysis: Secondary | ICD-10-CM

## 2013-06-05 DIAGNOSIS — N186 End stage renal disease: Secondary | ICD-10-CM

## 2013-06-05 HISTORY — DX: End stage renal disease: N18.6

## 2013-06-05 HISTORY — DX: End stage renal disease: Z99.2

## 2013-06-05 LAB — GLUCOSE, CAPILLARY
Glucose-Capillary: 101 mg/dL — ABNORMAL HIGH (ref 70–99)
Glucose-Capillary: 83 mg/dL (ref 70–99)

## 2013-06-05 LAB — RENAL FUNCTION PANEL
Albumin: 1.9 g/dL — ABNORMAL LOW (ref 3.5–5.2)
BUN: 23 mg/dL (ref 6–23)
Creatinine, Ser: 5.12 mg/dL — ABNORMAL HIGH (ref 0.50–1.35)
Phosphorus: 4.1 mg/dL (ref 2.3–4.6)
Potassium: 3.7 mEq/L (ref 3.5–5.1)

## 2013-06-05 LAB — PROTIME-INR: Prothrombin Time: 22.5 seconds — ABNORMAL HIGH (ref 11.6–15.2)

## 2013-06-05 MED ORDER — WARFARIN SODIUM 2.5 MG PO TABS
2.5000 mg | ORAL_TABLET | Freq: Once | ORAL | Status: AC
  Administered 2013-06-05: 2.5 mg via ORAL
  Filled 2013-06-05: qty 1

## 2013-06-05 NOTE — Progress Notes (Signed)
Physical Therapy Session Note  Patient Details  Name: [name] MRN: [id number] Date of Birth: 02-09-1934  Today's Date: 06/05/2013 Time: 0930-1000 Time Calculation (min): 30 min  Short Term Goals: Week 1:  PT Short Term Goal 1 (Week 1): Pt will be able to perform supin>sit with min A while maintaining sternal precautions PT Short Term Goal 2 (Week 1): Pt will be able to perform 75 ft gait in controlled environment with S using RW  PT Short Term Goal 3 (Week 1): Pt will be able to perform functional transfers with S while maintaining sternal precautions   Skilled Therapeutic Interventions/Progress Updates:    Therapy session focused on w/c propulsion and car transfers. Pt performed short distance (25 ft) w/c propulsion using B/L LE's to propel with mod A for cardiovascular endurance and LE strengthening. Pt performed car<>w/c transfer with close S and VC's for set up. Pt was able to adhere and recall 5/5 sternal precautions. TED knee high hose were donned in sitting with max A as requested by PA. No c/o pain during therapy session. Attempted to ween pt from O2 during therapy session however SpO2 dropped to 84% while on room air with when sitting SpO2 was able to come up to 93% with diaphragmatic breathing in sitting therefore 2 L of O2 was utilized to perform functional activities.      Therapy Documentation Precautions:  Precautions Precautions: Sternal;Fall Precaution Comments:  (Pt able to recall 1/5 sternal precautions) Restrictions Weight Bearing Restrictions: Yes RUE Weight Bearing: Non weight bearing (Sternal precutions ) LUE Weight Bearing: Non weight bearing (sternal precautions)  See FIM for current functional status  Therapy/Group: Individual Therapy  [name], Disney Ruggiero 06/05/2013, 8:34 AM

## 2013-06-05 NOTE — Progress Notes (Signed)
Subjective/Complaints: Slept well. Concerned that he will be here too long A 12 point review of systems has been performed and if not noted above is otherwise negative.   Objective: Vital Signs: Blood pressure 128/66, pulse 70, temperature 97.7 F (36.5 C), temperature source Oral, resp. rate 18, weight 97.5 kg (214 lb 15.2 oz), SpO2 95.00%. No results found.  Recent Labs  06/03/13 0455 06/04/13 1743  WBC 7.7 7.9  HGB 8.4* 8.3*  HCT 28.3* 28.2*  PLT 269 287    Recent Labs  06/04/13 1743 06/05/13 0600  NA 137 141  K 4.8 3.7  CL 97 103  GLUCOSE 89 78  BUN 59* 23  CREATININE 9.24* 5.12*  CALCIUM 8.5 8.0*   CBG (last 3)   Recent Labs  06/04/13 1648 06/04/13 2245 06/05/13 0711  GLUCAP 86 82 73    Wt Readings from Last 3 Encounters:  06/05/13 97.5 kg (214 lb 15.2 oz)  06/03/13 99.3 kg (218 lb 14.7 oz)  06/03/13 99.3 kg (218 lb 14.7 oz)    Physical Exam:  Constitutional: He is oriented to person, place, and time. He appears well-developed and well-nourished.  Keeps eyes closed  HENT:  Head: Normocephalic and atraumatic.  Eyes: Conjunctivae are normal. Pupils are equal, round, and reactive to light.  Neck: Normal range of motion. Neck supple. No thyromegaly present.  Cardiovascular: Normal rate. An irregularly irregular rhythm present. No murmur  Pulmonary/Chest: Effort normal and breath sounds normal. No respiratory distress. He has no wheezes.  Abdominal: Soft. Bowel sounds are normal. He exhibits no distension. There is no tenderness.  Musculoskeletal: He exhibits no edema and no tenderness. Right greater troch tender to touch.  Stasis changes BLE. Incisions BLE clean, dry And intact.  Neurological: He is oriented to person, place, and time. Appropriate conversationally. Used calendar to recall day. Able to follow simple commands without difficulty. Motor strength is 4/5 in left hip flexor knee extensor ankle dorsiflexor and plantar flexor 3 minus right hip  flexor, knee extensors, ankle dorsiflexor plantar flexor, 5/5 bilateral deltoid, bicep, tricep, grip. Decreased sensation right foot compared to left foot, but difference negligible   Assessment/Plan: 1. Functional deficits secondary to deconditioning after CABG/multiple medical issues which require 3+ hours per day of interdisciplinary therapy in a comprehensive inpatient rehab setting. Physiatrist is providing close team supervision and 24 hour management of active medical problems listed below. Physiatrist and rehab team continue to assess barriers to discharge/monitor patient progress toward functional and medical goals.  Discussed LOS with patient and reassured him that we expect only 7-10 days FIM: FIM - Bathing Bathing Steps Patient Completed: Chest;Right Arm;Left Arm;Front perineal area;Abdomen;Right upper leg;Left upper leg;Right lower leg (including foot);Left lower leg (including foot) Bathing: 4: Min-Patient completes 8-9 27f 10 parts or 75+ percent  FIM - Upper Body Dressing/Undressing Upper body dressing/undressing steps patient completed: Thread/unthread right sleeve of front closure shirt/dress;Button/unbutton shirt;Pull shirt around back of front closure shirt/dress Upper body dressing/undressing: 4: Min-Patient completed 75 plus % of tasks FIM - Lower Body Dressing/Undressing Lower body dressing/undressing steps patient completed: Thread/unthread right underwear leg;Thread/unthread left underwear leg;Pull underwear up/down;Thread/unthread right pants leg;Thread/unthread left pants leg;Pull pants up/down;Don/Doff right sock;Don/Doff left sock Lower body dressing/undressing: 4: Min-Patient completed 75 plus % of tasks (min assist sit<>stand)  FIM - Toileting Toileting: 0: Activity did not occur  FIM - Diplomatic Services operational officer Devices: Elevated toilet seat;Grab bars Toilet Transfers: 0-Activity did not occur  FIM - Landscape architect Devices:  Walker Bed/Chair Transfer: 3: Bed > Chair or W/C: Mod A (lift or lower assist);4: Supine > Sit: Min A (steadying Pt. > 75%/lift 1 leg)  FIM - Locomotion: Wheelchair Locomotion: Wheelchair: 0: Activity did not occur (UE sternal precautions) FIM - Locomotion: Ambulation Locomotion: Ambulation Assistive Devices: Designer, industrial/product Ambulation/Gait Assistance: 4: Min guard Locomotion: Ambulation: 1: Travels less than 50 ft with minimal assistance (Pt.>75%)  Comprehension Comprehension Mode: Auditory Comprehension: 5-Follows basic conversation/direction: With no assist  Expression Expression Mode: Verbal Expression: 5-Expresses basic needs/ideas: With extra time/assistive device  Social Interaction Social Interaction: 5-Interacts appropriately 90% of the time - Needs monitoring or encouragement for participation or interaction.  Problem Solving Problem Solving: 5-Solves basic 90% of the time/requires cueing < 10% of the time  Memory Memory: 4-Recognizes or recalls 75 - 89% of the time/requires cueing 10 - 24% of the time  Medical Problem List and Plan:  1. DVT Prophylaxis/Anticoagulation: Pharmaceutical: Coumadin  2. Pain Management: Will discontinue hydrocodone and use tylenol prn for pain.  3. Mood: Has anxiety regarding renal status. Family reports confusion/lethargy past HD sessions. Will monitor and offer ego support. LCSW to follow for evaluation.  4. Neuropsych: This patient is capable of making decisions on his own behalf.  5. Acute on Chronic renal failure: Continues with poor urine output. Check daily weights, strict I & O. Continue renal diet. HD--[name],[name],Sa.  6. Anemia of chronic disease: On nulecit IV for n ow.  7. Atrial Fibrillation: Monitor with bid checks. Amiodarone d/c and coreg increased today. Monitor for bradycardia.  8. Peripheral edema:  TEDs for use when OOB.  9. Constipation: PRN lax's.   LOS (Days) 2 A FACE TO FACE EVALUATION WAS  PERFORMED  [name] 06/05/2013 9:48 AM

## 2013-06-05 NOTE — Progress Notes (Signed)
[facility] Individual Statement of Services  Patient Name:  [name]  Date:  06/05/2013  Welcome to the [facility].  Our goal is to provide you with an individualized program based on your diagnosis and situation, designed to meet your specific needs.  With this comprehensive rehabilitation program, you will be expected to participate in at least 3 hours of rehabilitation therapies Monday-Friday, with modified therapy programming on the weekends.  Your rehabilitation program will include the following services:  Physical Therapy (PT), Occupational Therapy (OT), Speech Therapy (ST), 24 hour per day rehabilitation nursing, Therapeutic Recreaction (TR), Neuropsychology, Case Management (Social Worker), Rehabilitation Medicine, Nutrition Services and Pharmacy Services  Weekly team conferences will be held on Tuesdays to discuss your progress.  Your Social Worker will talk with you frequently to get your input and to update you on team discussions.  Team conferences with you and your family in attendance may also be held.  Expected length of stay: 10 days  Overall anticipated outcome: supervision - modified                                                                                                                             independent  Depending on your progress and recovery, your program may change. Your Social Worker will coordinate services and will keep you informed of any changes. Your Social Worker's name and contact numbers are listed  below.  The following services may also be recommended but are not provided by the [facility]:   Driving Evaluations  Home Health Rehabiltiation Services  Outpatient Rehabilitatation [facility]  Vocational Rehabilitation   Arrangements will be made to provide these services after discharge if needed.  Arrangements include referral to agencies that provide these services.  Your  insurance has been verified to be:  Medicare and [employer] Your primary doctor is:  Dr. Olena Leatherwood  Pertinent information will be shared with your doctor and your insurance company.  Social Worker:  [place], [place] [phone] or [phone]  Information discussed with and copy given to patient by: Amada Jupiter, 06/05/2013, 11:53 AM

## 2013-06-05 NOTE — Plan of Care (Signed)
Problem: Food- and Nutrition-Related Knowledge Deficit (NB-1.1) Goal: Nutrition education Formal process to instruct or train a patient/client in a skill or to impart knowledge to help patients/clients voluntarily manage or modify food choices and eating behavior to maintain or improve health.  Outcome: Progressing Nutrition Education Note  RD consulted for Renal Education. Provided CKD Stage 5 handout to patient's wife. Reviewed food groups and provided written recommended serving sizes specifically determined for patient's current nutritional status.   Explained why diet restrictions are needed and provided lists of foods to limit/avoid that are high potassium, sodium, and phosphorus. Provided specific recommendations on safer alternatives of these foods. Strongly encouraged compliance of this diet until kidney function better understood.   Discussed importance of protein intake at each meal and snack. Provided examples of how to maximize protein intake throughout the day. Discussed need for fluid restriction with dialysis, importance of minimizing weight gain between HD treatments, and renal-friendly beverage options.  Encouraged pt to discuss specific diet questions/concerns with RD at HD outpatient facility. Teach back method used.  Expect good compliance.  Body mass index is 28.49 kg/(m^2). Pt meets criteria for overweight based on current BMI.  Pt continues eating well at 50% of meals on average.   Loyce Dys, MS RD LDN Clinical Inpatient Dietitian Pager: [phone] Weekend/After hours pager: [phone]

## 2013-06-05 NOTE — Progress Notes (Signed)
Speech Language Pathology Daily Session Note  Patient Details  Name: [name] MRN: [id number] Date of Birth: Dec 31, 1933  Today's Date: 06/05/2013 Time: 1330-1400 Time Calculation (min): 30 min  Short Term Goals: Week 1: SLP Short Term Goal 1 (Week 1): Pt will use external aids to demonstrate orientation to person, time, location, and situation with Mod I. SLP Short Term Goal 2 (Week 1): Pt will demonstrate basic problem solving during functional task with Min cues. SLP Short Term Goal 3 (Week 1): Pt will sustain attention to functional task for 10 min with Min cues. SLP Short Term Goal 4 (Week 1): Pt will demonstrate adequate medication management using external aids with Min cues. SLP Short Term Goal 5 (Week 1): Pt will consume regular textures with thin liquids with no s/s of aspiration.  Skilled Therapeutic Interventions: Skilled treatment focused on cognitive goals. SLP facilitated session with medication and money management tasks with extra wait time. Pt completed medication label and money counting activities with  Max A from SLP, exhibiting difficulty with working memory and basic addition even with visual/written cues. Pt reports that at home he was managing his medications and money (always cash/money orders) independently without difficulty, however denied that this activity was challenging. Recommend to continue to offer memory strategies, assistance with medication management, and regular texture trials in order to advance solids.    FIM:  Comprehension Comprehension Mode: Auditory Comprehension: 5-Follows basic conversation/direction: With extra time/assistive device Expression Expression Mode: Verbal Expression: 5-Expresses basic needs/ideas: With extra time/assistive device Social Interaction Social Interaction: 5-Interacts appropriately 90% of the time - Needs monitoring or encouragement for participation or interaction. Problem Solving Problem Solving: 5-Solves  basic 90% of the time/requires cueing < 10% of the time Memory Memory: 3-Recognizes or recalls 50 - 74% of the time/requires cueing 25 - 49% of the time  Pain Pain Assessment Pain Assessment: No/denies pain Pain Type: Acute pain Pain Location: Hip Pain Orientation: Right Pain Intervention(s): Medication (See eMAR);Repositioned  Therapy/Group: Individual Therapy  Maxcine Ham 06/05/2013, 3:30 PM  Maxcine Ham, M.A. CCC-SLP

## 2013-06-05 NOTE — Progress Notes (Signed)
Occupational Therapy Session Note  Patient Details  Name: [name] MRN: [id number] Date of Birth: 1933-11-24  Today's Date: 06/05/2013 Time: 1478-2956 Time Calculation (min): 45 min  Short Term Goals: Week 1:  OT Short Term Goal 1 (Week 1): STG=LTG secondary to ELOS  Skilled Therapeutic Interventions/Progress Updates:    Pt practiced tub/shower transfers in the OT apartment.  Needed assistance with lifting the LLE over the edge of the tub.  Pt able to perform sit to stand from wheelchair and from tub bench during transfer, following sternal precautions with min instructional cueing.  Also practiced bed transfer as well which he was able to perform with close supervision.  During session pt's O2 sats maintained at 94-97% on room air with activity.  Ambulated from the ADL apartment to the gym to work on balance.  Integrated ring toss in standing for endurance.  Pt able to maintain static standing with supervision but needed min assist to walk over and pick up the rings from the floor without use of assistive device.  After task pt reporting feeling light headed and nauseas.  Both subsided with rest in sitting.  BP checked at 108/56 in sitting 102/58 in standing.  Pt returned to room at end of session via wheelchair and returned to bed for rest.  Therapy Documentation Precautions:  Precautions Precautions: Sternal;Fall Precaution Comments:  (Pt able to recall 1/5 sternal precautions) Restrictions Weight Bearing Restrictions: Yes RUE Weight Bearing: Non weight bearing (sternal precautions) LUE Weight Bearing: Non weight bearing (sternal precautions)  Vital Signs: Therapy Vitals BP: 102/58 mmHg Patient Position, if appropriate: Sitting Oxygen Therapy SpO2: 94 % O2 Device: None (Room air) Pulse Oximetry Type: Intermittent Pain: Pain Assessment Pain Assessment: 0-10 Pain Score: 6  Pain Type: Acute pain Pain Location: Hip Pain Orientation: Right Pain Onset: Gradual Patients  Stated Pain Goal: 4 Pain Intervention(s): Medication (See eMAR);Repositioned ADL: See FIM for current functional status  Therapy/Group: Individual Therapy  Jaren Vanetten OTR/L 06/05/2013, 12:14 PM

## 2013-06-05 NOTE — Progress Notes (Signed)
PHARMACY FOLLOW UP NOTE  Pharmacy Consult for : Coumadin Indication:  atrial fibrillation  Dosing Weight: 97.5 kg  Labs:  Recent Labs  06/03/13 0455 06/04/13 0610 06/04/13 1444 06/04/13 1743 06/05/13 0600  HGB 8.4*  --   --  8.3*  --   HCT 28.3*  --   --  28.2*  --   PLT 269  --   --  287  --   LABPROT 24.4*  --  23.2*  --  22.5*  INR 2.28*  --  2.14*  --  2.05*  CREATININE 6.06* 8.62*  --  9.24* 5.12*   Lab Results  Component Value Date   INR 2.05* 06/05/2013   INR 2.14* 06/04/2013   INR 2.28* 06/03/2013    Medications:  Prescriptions prior to admission  Medication Sig Dispense Refill  . albuterol (PROVENTIL) (5 MG/ML) 0.5% nebulizer solution Take 0.5 mLs (2.5 mg total) by nebulization every 6 (six) hours as needed for wheezing or shortness of breath.  20 mL  12  . atorvastatin (LIPITOR) 20 MG tablet Take 1 tablet (20 mg total) by mouth daily at 6 PM.      . calcium acetate (PHOSLO) 667 MG capsule Take 1 capsule (667 mg total) by mouth 3 (three) times daily with meals.      . carvedilol (COREG) 3.125 MG tablet Take 1 tablet (3.125 mg total) by mouth 2 (two) times daily with a meal.      . darbepoetin (ARANESP) 60 MCG/0.3ML SOLN Inject 0.3 mLs (60 mcg total) into the skin every Tuesday at 6 PM.  4.2 mL    . feeding supplement (RESOURCE BREEZE) LIQD Take 1 Container by mouth 3 (three) times daily between meals.    0  . guaiFENesin-dextromethorphan (ROBITUSSIN DM) 100-10 MG/5ML syrup Take 15 mLs by mouth every 4 (four) hours as needed for cough.  118 mL  0  . HYDROcodone-acetaminophen (NORCO/VICODIN) 5-325 MG per tablet Take 1-2 tablets by mouth every 4 (four) hours as needed.  30 tablet  0  . levothyroxine (SYNTHROID, LEVOTHROID) 100 MCG tablet Take 1 tablet (100 mcg total) by mouth daily before breakfast.      . multivitamin (RENA-VIT) TABS tablet Take 1 tablet by mouth daily.    0  . ondansetron (ZOFRAN) 4 MG tablet Take 1 tablet (4 mg total) by mouth every 8 (eight) hours  as needed for nausea.  20 tablet  0  . simvastatin (ZOCOR) 40 MG tablet Take 40 mg by mouth every evening.      . sodium chloride 0.9 % SOLN 100 mL with ferric gluconate 12.5 MG/ML SOLN 125 mg Inject 125 mg into the vein Every Tuesday,Thursday,and Saturday with dialysis.      [name] warfarin (COUMADIN) 2.5 MG tablet Take 1 tablet (2.5 mg total) by mouth daily at 6 PM.       Scheduled:  . atorvastatin  20 mg Oral q1800  . calcium acetate  667 mg Oral TID WC  . carvedilol  3.125 mg Oral BID WC  . [START ON 06/09/2013] darbepoetin (ARANESP) injection - NON-DIALYSIS  60 mcg Subcutaneous Q Tue-1800  . feeding supplement  1 Container Oral TID BM  . ferric gluconate (FERRLECIT/NULECIT) IV  125 mg Intravenous Q T,Th,Sa-HD  . heparin  4,600 Units Intravenous Once  . insulin aspart  0-5 Units Subcutaneous QHS  . insulin aspart  0-9 Units Subcutaneous TID WC  . levothyroxine  100 mcg Oral QAC breakfast  . multivitamin  1 tablet  Oral Daily  . pantoprazole  40 mg Oral Daily  . senna-docusate  2 tablet Oral QHS  . sodium chloride  3 mL Intravenous Q12H  . Warfarin - Pharmacist Dosing Inpatient   Does not apply q1800    Assessment:  79 y/o male s/p NSTEMI, cardiogenic shock, and emergent CABG [7/9] who is on chronic Coumadin for history of DVT and atrial fibrillation.  INR remains within therapeutic window > 2.05.  No bleeding complications noted   Goal of Therapy:  INR 2-3   Plan:  Coumadin 2.5 mg today.  Daily INR's, CBC.  Monitor for bleeding complications.    [name], Deetta Perla.D 06/05/2013, 1:57 PM

## 2013-06-05 NOTE — Progress Notes (Signed)
Reviewed and in agreement with treatment provided.  

## 2013-06-05 NOTE — Progress Notes (Addendum)
[facility] ROUNDING NOTE  Subjective:  Continues without urine output Dialysis yesterday Wipes him out Dialysis staff missed order to keep vol even so 685 removed BP's remain soft  Objective Vital signs in last 24 hours: Filed Vitals:   06/04/13 2230 06/05/13 0022 06/05/13 0500 06/05/13 0636  BP: 94/58 103/57  128/66  Pulse: 115   70  Temp: 97.7 F (36.5 C)   97.7 F (36.5 C)  TempSrc: Oral   Oral  Resp: 20   18  Weight: 97.3 kg (214 lb 8.1 oz)  97.5 kg (214 lb 15.2 oz)   SpO2: 96%   95%   Weight change: -3.487 kg (-7 lb 11 oz)  Intake/Output Summary (Last 24 hours) at 06/05/13 1101 Last data filed at 06/05/13 0916  Gross per 24 hour  Intake    360 ml  Output    658 ml  Net   -298 ml   Physical Exam:  BP 128/66  Pulse 70  Temp(Src) 97.7 F (36.5 C) (Oral)  Resp 18  Wt 97.5 kg (214 lb 15.2 oz)  BMI 28.37 kg/m2  SpO2 95% Awake and appropriate, working with OT Lungs clear Sternotomy scar healing  Abd soft and not tender Leg incisions healing No edema LE's Right IJ tunnelled HD cath dsg in place (05/26/13)  Labs: Basic Metabolic Panel:  Recent Labs Lab 05/31/13 0425 06/01/13 0500 06/02/13 0518 06/03/13 0455 06/04/13 0610 06/04/13 1743 06/05/13 0600  NA 136 135 136 137 140 137 141  K 3.9 4.1 4.5 3.9 4.3 4.8 3.7  CL 97 97 98 98 101 97 103  CO2 30 24 22 26 27 27 29   GLUCOSE 93 83 91 81 89 89 78  BUN 37* 56* 72* 35* 53* 59* 23  CREATININE 4.89* 6.81* 9.13* 6.06* 8.62* 9.24* 5.12*  CALCIUM 8.1* 8.1* 8.5 8.6 8.5 8.5 8.0*  PHOS 4.2 5.2* 6.2* 4.2 5.9* 5.1* 4.1   Results for [name], [name] (MRN [id number]) as of 06/01/2013 07:32   Ref. Range  05/31/2013 10:33   Iron  Latest Range: 42-135 ug/dL  26 (L)   UIBC  Latest Range: 125-400 ug/dL  409   TIBC  Latest Range: 215-435 ug/dL  811   Saturation Ratios  Latest Range: 20-55 %  10 (L)     Recent Labs Lab 06/04/13 0610 06/04/13 1743 06/05/13 0600  ALBUMIN 1.9* 2.0* 1.9*    Recent  Labs Lab 06/01/13 0500 06/02/13 0518 06/03/13 0455 06/04/13 1743  WBC 10.3 8.3 7.7 7.9  HGB 8.2* 7.7* 8.4* 8.3*  HCT 26.7* 26.4* 28.3* 28.2*  MCV 84.0 85.2 86.3 87.0  PLT 251 282 269 287   Lab Results  Component Value Date   INR 2.05* 06/05/2013   INR 2.14* 06/04/2013   INR 2.28* 06/03/2013    Medications:   . atorvastatin  20 mg Oral q1800  . calcium acetate  667 mg Oral TID WC  . carvedilol  3.125 mg Oral BID WC  . [START ON 06/09/2013] darbepoetin (ARANESP) injection - NON-DIALYSIS  60 mcg Subcutaneous Q Tue-1800  . feeding supplement  1 Container Oral TID BM  . ferric gluconate (FERRLECIT/NULECIT) IV  125 mg Intravenous Q T,Th,Sa-HD  . heparin  4,600 Units Intravenous Once  . insulin aspart  0-5 Units Subcutaneous QHS  . insulin aspart  0-9 Units Subcutaneous TID WC  . levothyroxine  100 mcg Oral QAC breakfast  . multivitamin  1 tablet Oral Daily  . pantoprazole  40 mg Oral  Daily  . senna-docusate  2 tablet Oral QHS  . sodium chloride  3 mL Intravenous Q12H  . Warfarin - Pharmacist Dosing Inpatient   Does not apply q1800  acetaminophen, albuterol, aluminum hydroxide, bisacodyl, diphenhydrAMINE, guaiFENesin-dextromethorphan, HYDROcodone-acetaminophen, ondansetron (ZOFRAN) IV, ondansetron, sodium chloride, traZODone  I  have reviewed scheduled and prn medications.   ASSESSMENT/RECOMMENDATIONS   79yo WM with baseline CKD (1.5-1.7) with oligoanuric ischemic ATN post CP/cardiogenic shock, cath, CABG (7/9), requiring renal replacement therapy since 7/17 with CRRT, transitioned to intermittent HD on 7/21, now with TDC placed 7/22. On TTS schedule with hopes of recovery of function.   AKI on CKD Dialysis dependent since 05/21/13.   Remains oliguric.  Too early to call ESRD.  Continue TTS schedule until evidence of recovery.  Next HD Saturday  4 hours 2K bath Keep volume even Weights all over the place so unclear EDW (weigh pre and post in HD and in absence of edema, keep vol  evennext treatment)  SP CABG Per [facility] rehab On coumadin Amio d/c'd   Anemia  On aranesp 60 QTues.  Tsat low.  Repleting with HD X 10 doses of ferrlecit (started 7/29)   A fib, now SR.  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

## 2013-06-05 NOTE — Progress Notes (Signed)
Occupational Therapy Session Note  Patient Details  Name: [name] MRN: [id number] Date of Birth: January 31, 1934  Today's Date: 06/05/2013 Time:  -   0800-0900  (60 min)  1st session                  1515-1545  (30 min)  2nd session    Short Term Goals: Week 1:  OT Short Term Goal 1 (Week 1): STG=LTG secondary to ELOS  Skilled Therapeutic Interventions/Progress Updates:    1st session:  Addressed bed mobility, sternal precauutions, functional mobility,sit to stand, standing balance.  Pt lying in bed.  Stated he felt nauseated but had had some medicine. Pt. Verbalized 2/4 sternal precautions (he knew no pushing or pulling and no reaching over head).   Pt. Needed min assist to go from sidelying to sit, sit to stand and transfer to wc.  Sat at sink for bathing.  Did adhere to sternal precautions when going from sit to stand but continued to need minimal assist with lifting.  Pt. Remained in wc with call bell in place.   2nd session:   Focus of treatment was bed mobility, transfers,  standing balance,  therapeutic activities, sustained attention, adherence to sternal precautions.  Pt. Practiced transfers to regular bed, sofa, chair, dining room chair.  He was able to do with SBA and adherence to sternal precautions.  He named 3/4 sternal precautions.      Therapy Documentation Precautions:  Precautions Precautions: Sternal;Fall Precaution Comments:  (Pt able to recall 1/5 sternal precautions) Restrictions Weight Bearing Restrictions: Yes RUE Weight Bearing: Non weight bearing (Sternal precutions ) LUE Weight Bearing: Non weight bearing (sternal precautions) General:   Oxygen Therapy SpO2: 95 % O2 Device: Nasal cannula O2 Flow Rate (L/min): 2 L/min Pain:  3/10 right hip  1st session            3/10 right hip   2nd session      See FIM for current functional status  Therapy/Group: Individual Therapy  Humberto Seals 06/05/2013, 8:31 AM

## 2013-06-06 ENCOUNTER — Inpatient Hospital Stay (HOSPITAL_COMMUNITY): Payer: Medicare Other | Admitting: Occupational Therapy

## 2013-06-06 ENCOUNTER — Inpatient Hospital Stay (HOSPITAL_COMMUNITY): Payer: Medicare Other | Admitting: Speech Pathology

## 2013-06-06 ENCOUNTER — Inpatient Hospital Stay (HOSPITAL_COMMUNITY): Payer: Medicare Other | Admitting: Physical Therapy

## 2013-06-06 DIAGNOSIS — I214 Non-ST elevation (NSTEMI) myocardial infarction: Secondary | ICD-10-CM

## 2013-06-06 DIAGNOSIS — R5381 Other malaise: Secondary | ICD-10-CM

## 2013-06-06 DIAGNOSIS — N179 Acute kidney failure, unspecified: Secondary | ICD-10-CM

## 2013-06-06 LAB — RENAL FUNCTION PANEL
Albumin: 2 g/dL — ABNORMAL LOW (ref 3.5–5.2)
GFR calc Af Amer: 7 mL/min — ABNORMAL LOW (ref >90)
Phosphorus: 4.5 mg/dL (ref 2.3–4.6)
Potassium: 4.2 mEq/L (ref 3.5–5.1)
Sodium: 140 mEq/L (ref 135–145)

## 2013-06-06 LAB — PROTIME-INR: Prothrombin Time: 23.7 seconds — ABNORMAL HIGH (ref 11.6–15.2)

## 2013-06-06 LAB — CBC
Hemoglobin: 8.8 g/dL — ABNORMAL LOW (ref 13.0–17.0)
MCH: 26 pg (ref 26.0–34.0)
MCHC: 28.9 g/dL — ABNORMAL LOW (ref 30.0–36.0)
MCV: 89.7 fL (ref 78.0–100.0)
Platelets: 230 10*3/uL (ref 150–400)

## 2013-06-06 LAB — GLUCOSE, CAPILLARY: Glucose-Capillary: 82 mg/dL (ref 70–99)

## 2013-06-06 MED ORDER — WARFARIN SODIUM 2.5 MG PO TABS
2.5000 mg | ORAL_TABLET | Freq: Every day | ORAL | Status: DC
  Administered 2013-06-07 (×2): 2.5 mg via ORAL
  Filled 2013-06-06 (×3): qty 1

## 2013-06-06 NOTE — Progress Notes (Addendum)
Occupational Therapy Session Note  Patient Details  Name: [name] MRN: [id number] Date of Birth: [birth year]  Today's Date: 06/06/2013 Time: 0730-0830 Time Calculation (min): 60 min  Skilled Therapeutic Interventions/Progress Updates: AM session:   ADL, including toileting and bathing and dressing with focus on endurance and adhering to sternal precautions while focusing on LE bathing and dressing.    Patient with difficulty reaching reaching both feet for bathing and dressing, especially when endurance poor toward end of session.      Patient initiated sharing sternal precautions with this clinician and was able to demonstrate carryover.  Patient benefited from extra time for rest breaks and verbal assurance to prompt him to try self care tasks that he did not think he was able to do.  Wife and son (who stated he has taken FMLA to help with dad) came in during session.  Wife with difficulty walk and stated she has arthritis "in hips and all over" also stated that she will be able to assist husband as needed upon discharge.      Therapy Documentation Precautions:  Precautions Precautions: Sternal;Fall Precaution Comments:  (Pt able to recall 1/5 sternal precautions) Restrictions Weight Bearing Restrictions: Yes RUE Weight Bearing: Non weight bearing (because of sternal precautions.) LUE Weight Bearing: Non weight bearing (because of sternal precautions.)   Pain:9/10 bilateral hips and shoulders, constant "It's my arthrits."  RN planned to bring in medication  See FIM for current functional status  Therapy/Group: Individual Therapy  [name] [facility] 06/06/2013, 12:16 PM

## 2013-06-06 NOTE — Progress Notes (Signed)
Speech Language Pathology Daily Session Note  Patient Details  Name: [name] MRN: [id number] Date of Birth: 1934/04/17  Today's Date: 06/06/2013 Time: 4098-1191 Time Calculation (min): 25 min  Short Term Goals: Week 1: SLP Short Term Goal 1 (Week 1): Pt will use external aids to demonstrate orientation to person, time, location, and situation with Mod I. SLP Short Term Goal 2 (Week 1): Pt will demonstrate basic problem solving during functional task with Min cues. SLP Short Term Goal 3 (Week 1): Pt will sustain attention to functional task for 10 min with Min cues. SLP Short Term Goal 4 (Week 1): Pt will demonstrate adequate medication management using external aids with Min cues. SLP Short Term Goal 5 (Week 1): Pt will consume regular textures with thin liquids with no s/s of aspiration.  Skilled Therapeutic Interventions: Skilled treatment focused on cognitive goals. SLP facilitated session by providing Max-total A question cues for intellectual awareness into cognitive deficits. Pt also required total A to identify current medications and their functions. List was made as a visual aid to increase recall, however, but continued to require total A for recall after a delay. Also required max verbal and question cues to verbally problem solve new system for medication management once at home to increase overall safety with medications.    FIM:  Comprehension Comprehension Mode: Auditory Comprehension: 5-Follows basic conversation/direction: With extra time/assistive device Expression Expression Mode: Verbal Expression: 5-Expresses basic needs/ideas: With extra time/assistive device Social Interaction Social Interaction: 5-Interacts appropriately 90% of the time - Needs monitoring or encouragement for participation or interaction. Problem Solving Problem Solving: 5-Solves basic 90% of the time/requires cueing < 10% of the time Memory Memory: 2-Recognizes or recalls 25 - 49% of the  time/requires cueing 51 - 75% of the time  Pain Pain Assessment Pain Assessment: 0-10 Pain Score:  (better) Pain Type: Acute pain Pain Location: Hip Pain Orientation: Right Pain Descriptors / Indicators: Aching Pain Frequency: Constant Pain Onset: On-going Pain Intervention(s): Medication (See eMAR)  Therapy/Group: Individual Therapy  Armanie Martine 06/06/2013, 11:18 AM

## 2013-06-06 NOTE — Progress Notes (Signed)
ANTICOAGULATION CONSULT NOTE - Follow Up Consult  Pharmacy Consult for coumadin Indication: atrial fibrillation  Allergies  Allergen Reactions  . 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   Medications:  Prescriptions prior to admission  Medication Sig Dispense Refill  . albuterol (PROVENTIL) (5 MG/ML) 0.5% nebulizer solution Take 0.5 mLs (2.5 mg total) by nebulization every 6 (six) hours as needed for wheezing or shortness of breath.  20 mL  12  . atorvastatin (LIPITOR) 20 MG tablet Take 1 tablet (20 mg total) by mouth daily at 6 PM.      . calcium acetate (PHOSLO) 667 MG capsule Take 1 capsule (667 mg total) by mouth 3 (three) times daily with meals.      . carvedilol (COREG) 3.125 MG tablet Take 1 tablet (3.125 mg total) by mouth 2 (two) times daily with a meal.      . darbepoetin (ARANESP) 60 MCG/0.3ML SOLN Inject 0.3 mLs (60 mcg total) into the skin every Tuesday at 6 PM.  4.2 mL    . feeding supplement (RESOURCE BREEZE) LIQD Take 1 Container by mouth 3 (three) times daily between meals.    0  . guaiFENesin-dextromethorphan (ROBITUSSIN DM) 100-10 MG/5ML syrup Take 15 mLs by mouth every 4 (four) hours as needed for cough.  118 mL  0  . HYDROcodone-acetaminophen (NORCO/VICODIN) 5-325 MG per tablet Take 1-2 tablets by mouth every 4 (four) hours as needed.  30 tablet  0  . levothyroxine (SYNTHROID,  LEVOTHROID) 100 MCG tablet Take 1 tablet (100 mcg total) by mouth daily before breakfast.      . multivitamin (RENA-VIT) TABS tablet Take 1 tablet by mouth daily.    0  . ondansetron (ZOFRAN) 4 MG tablet Take 1 tablet (4 mg total) by mouth every 8 (eight) hours as needed for nausea.  20 tablet  0  . simvastatin (ZOCOR) 40 MG tablet Take 40 mg by mouth every evening.      . sodium chloride 0.9 % SOLN 100 mL with ferric gluconate 12.5 MG/ML SOLN 125 mg Inject 125 mg into the vein Every Tuesday,Thursday,and Saturday with dialysis.      [name] warfarin (COUMADIN) 2.5 MG tablet Take 1 tablet (2.5 mg total) by mouth daily at 6 PM.        Assessment: 79 y/o male s/p NSTEMI, cardiogenic shock, and emergent CABG [7/9] who is on chronic Coumadin for history of DVT and atrial fibrillation.  INR therapeutic at 2.2.  H/H 8.8/30.4, pltc 230.  No bleeding reported.  Off amiodarone since 7/29.    Goal of Therapy:  INR 2-3    Plan:  1. Resume home dose of coumadin 2.5 mg daily at 1800 2. Decrease INR to MWF  only Herby Abraham, Pharm.D. [phone] 06/06/2013 11:17 AM

## 2013-06-06 NOTE — Progress Notes (Signed)
[name] is a 79 y.o. male 09-11-1934 [id number]  Subjective: No new complaints. No new problems. Slept well. Feeling OK.  Objective: Vital signs in last 24 hours: Temp:  [98 F (36.7 C)] 98 F (36.7 C) (08/02 0553) Pulse Rate:  [67-68] 68 (08/02 0553) Resp:  [18] 18 (08/02 0553) BP: (104-105)/(54-58) 104/58 mmHg (08/02 0553) SpO2:  [98 %-99 %] 99 % (08/02 0553) Weight:  [214 lb 4.6 oz (97.2 kg)] 214 lb 4.6 oz (97.2 kg) (08/02 0553) Weight change: -2 lb 6.8 oz (-1.1 kg) Last BM Date: 06/05/13  Intake/Output from previous day: 08/01 0701 - 08/02 0700 In: 600 [P.O.:600] Out: -  Last cbgs: CBG (last 3)   Recent Labs  06/05/13 2107 06/06/13 0717 06/06/13 1131  GLUCAP 83 80 82     Physical Exam General: No apparent distress    HEENT: moist mucosa Lungs: Normal effort. Lungs clear to auscultation, no crackles or wheezes. Cardiovascular: Regular rate and rhythm, no edema Musculoskeletal:  No change from before Neurological: No new neurological deficits Wounds: N/A    Skin: clear Alert, cooperative   Lab Results: BMET    Component Value Date/Time   NA 140 06/06/2013 0500   K 4.2 06/06/2013 0500   CL 100 06/06/2013 0500   CO2 28 06/06/2013 0500   GLUCOSE 81 06/06/2013 0500   BUN 37* 06/06/2013 0500   CREATININE 7.46* 06/06/2013 0500   CALCIUM 8.2* 06/06/2013 0500   GFRNONAA 6* 06/06/2013 0500   GFRAA 7* 06/06/2013 0500   CBC    Component Value Date/Time   WBC 6.9 06/06/2013 0500   RBC 3.39* 06/06/2013 0500   HGB 8.8* 06/06/2013 0500   HCT 30.4* 06/06/2013 0500   PLT 230 06/06/2013 0500   MCV 89.7 06/06/2013 0500   MCH 26.0 06/06/2013 0500   MCHC 28.9* 06/06/2013 0500   RDW 23.7* 06/06/2013 0500   LYMPHSABS 2.0 03/03/2011 0417   MONOABS 0.6 03/03/2011 0417   EOSABS 0.4 03/03/2011 0417   BASOSABS 0.0 03/03/2011 0417    Studies/Results: No results found.  Medications: I have reviewed the patient's current medications.  Assessment/Plan:  1. DVT Prophylaxis/Anticoagulation:  Pharmaceutical: Coumadin  2. Pain Management: Will discontinue hydrocodone and use tylenol prn for pain.  3. Mood: Has anxiety regarding renal status. Family reports confusion/lethargy past HD sessions. Will monitor and offer ego support. LCSW to follow for evaluation.  4. Neuropsych: This patient is capable of making decisions on his own behalf.  5. Acute on Chronic renal failure: Continues with poor urine output. Check daily weights, strict I & O. Continue renal diet. HD--T,T,Sa.  6. Anemia of chronic disease: On nulecit IV for n ow.  7. Atrial Fibrillation: Monitor with bid checks. Amiodarone d/c and coreg increased today. Monitor for bradycardia.  8. Peripheral edema: TEDs for use when OOB.  9. Constipation: PRN lax's.     Length of stay, days: 3  Sonda Primes , MD 06/06/2013, 12:20 PM

## 2013-06-06 NOTE — Progress Notes (Signed)
Physical Therapy Session Note  Patient Details  Name: [name] MRN: [id number] Date of Birth: 1934-02-10  Today's Date: 06/06/2013 Time: 0900-1000 Time Calculation (min): 60 min  Short Term Goals: Week 1:  PT Short Term Goal 1 (Week 1): Pt will be able to perform supin>sit with min A while maintaining sternal precautions PT Short Term Goal 2 (Week 1): Pt will be able to perform 75 ft gait in controlled environment with S using RW  PT Short Term Goal 3 (Week 1): Pt will be able to perform functional transfers with S while maintaining sternal precautions   Therapy Documentation Precautions:  Precautions: Sternal;Fall Precaution Comments:  (Pt able to recall 1/5 sternal precautions) Restrictions Weight Bearing Restrictions: Yes RUE Weight Bearing: Non weight bearing (sternal precautions) LUE Weight Bearing: Non weight bearing (sternal precautions)  Pain: right hip chronic arthritis pain rated 6/10 with movement  Therapeutic Activity:(60') Bed Mobility Mod-Assist with supine<->sit and min-Assist with sit<->stand. Patient c/o severe Nausea and dizziness with transfers from supine to sit and from sit to stand which limited activity tolerance.   Monitoring of BP, HR during tx session with the following readings. Position  BP  HR Sit 1   99/48  72 Supine 1  108/50  73 Sit 2   96/59  74 Stand (only tolerated 30 seconds stand before need to sit and unable to monitor vitals in standing) Supine 2  113/50  72 * Nursing notified    Therapy/Group: Individual Therapy  Rex Kras 06/06/2013, 5:16 PM

## 2013-06-06 NOTE — Progress Notes (Signed)
Hemodialysis Tx shortened by 1 hour due to an emergency case that needed to be started, otherwise, pt tolerated hemodialysis Tx well.

## 2013-06-06 NOTE — Progress Notes (Signed)
[facility] R[facility]ING NOTE  Subjective:  Continues without much urine output Says he got dizzy and nauseous with PT this AM and had to go back to bed Note that supine BP's are soft No pain or SOB  Objective Vital signs in last 24 hours: Filed Vitals:   06/05/13 1100 06/05/13 1350 06/05/13 1450 06/06/13 0553  BP: 102/58  105/54 104/58  Pulse:   67 68  Temp:   98 F (36.7 C) 98 F (36.7 C)  TempSrc:   Oral Oral  Resp:   18 18  Height:  6' 0.83" (1.85 m)    Weight:    97.2 kg (214 lb 4.6 oz)  SpO2: 94%  98% 99%   Weight change: -1.1 kg (-2 lb 6.8 oz)  Intake/Output Summary (Last 24 hours) at 06/06/13 1129 Last data filed at 06/06/13 0730  Gross per 24 hour  Intake    720 ml  Output      0 ml  Net    720 ml   Physical Exam:  BP 104/58  Pulse 68  Temp(Src) 98 F (36.7 C) (Oral)  Resp 18  Ht 6' 0.84" (1.85 m)  Wt 97.2 kg (214 lb 4.6 oz)  BMI 28.4 kg/m2  SpO2 99% Awake and appropriate Lungs clear Sternotomy scar healing  Abd soft and not tender Leg incisions healing No edema LE's Right IJ tunnelled HD cath dsg in place (05/26/13)    Recent Labs Lab 06/01/13 0500 06/02/13 0518 06/03/13 0455 06/04/13 0610 06/04/13 1743 06/05/13 0600 06/06/13 0500  NA 135 136 137 140 137 141 140  K 4.1 4.5 3.9 4.3 4.8 3.7 4.2  CL 97 98 98 101 97 103 100  CO2 24 22 26 27 27 29 28   GLUCOSE 83 91 81 89 89 78 81  BUN 56* 72* 35* 53* 59* 23 37*  CREATININE 6.81* 9.13* 6.06* 8.62* 9.24* 5.12* 7.46*  CALCIUM 8.1* 8.5 8.6 8.5 8.5 8.0* 8.2*  PHOS 5.2* 6.2* 4.2 5.9* 5.1* 4.1 4.5   Results for [name], [name] (MRN [id number]) as of 06/01/2013 07:32   Ref. Range  05/31/2013 10:33   Iron  Latest Range: 42-135 ug/dL  26 (L)   UIBC  Latest Range: 125-400 ug/dL  629   TIBC  Latest Range: 215-435 ug/dL  528   Saturation Ratios  Latest Range: 20-55 %  10 (L)     Recent Labs Lab 06/04/13 1743 06/05/13 0600 06/06/13 0500  ALBUMIN 2.0* 1.9* 2.0*     Recent Labs Lab  06/02/13 0518 06/03/13 0455 06/04/13 1743 06/06/13 0500  WBC 8.3 7.7 7.9 6.9  HGB 7.7* 8.4* 8.3* 8.8*  HCT 26.4* 28.3* 28.2* 30.4*  MCV 85.2 86.3 87.0 89.7  PLT 282 269 287 230    Lab Results  Component Value Date   INR 2.20* 06/06/2013   INR 2.05* 06/05/2013   INR 2.14* 06/04/2013    Medications:   . atorvastatin  20 mg Oral q1800  . calcium acetate  667 mg Oral TID WC  . carvedilol  3.125 mg Oral BID WC  . [START ON 06/09/2013] darbepoetin (ARANESP) injection - NON-DIALYSIS  60 mcg Subcutaneous Q Tue-1800  . feeding supplement  1 Container Oral TID BM  . ferric gluconate (FERRLECIT/NULECIT) IV  125 mg Intravenous Q T,Th,Sa-HD  . heparin  4,600 Units Intravenous Once  . insulin aspart  0-5 Units Subcutaneous QHS  . insulin aspart  0-9 Units Subcutaneous TID WC  . levothyroxine  100 mcg  Oral QAC breakfast  . multivitamin  1 tablet Oral Daily  . pantoprazole  40 mg Oral Daily  . senna-docusate  2 tablet Oral QHS  . sodium chloride  3 mL Intravenous Q12H  . warfarin  2.5 mg Oral q1800  . Warfarin - Pharmacist Dosing Inpatient   Does not apply q1800  acetaminophen, albuterol, aluminum hydroxide, bisacodyl, diphenhydrAMINE, guaiFENesin-dextromethorphan, HYDROcodone-acetaminophen, ondansetron (ZOFRAN) IV, ondansetron, sodium chloride, traZODone I  have reviewed scheduled and prn medications.   ASSESSMENT/RECOMMENDATIONS   79yo WM with baseline CKD (1.5-1.7) with oligoanuric ischemic ATN post CP/cardiogenic shock, cath, CABG (7/9), requiring renal replacement therapy since 7/17 with CRRT, transitioned to intermittent HD on 7/21, now with TDC placed 7/22. On TTS schedule with hopes of recovery of function.   AKI on CKD Dialysis dependent since 05/21/13.   Remains oliguric.  Too early to call ESRD.  Continue TTS schedule until evidence of recovery.  Next HD today  4 hours 2K bath Keep volume even (Euvolemic on exam) Weights all over the place so unclear EDW (weigh pre and post in  HD and in absence of edema, keep vol even next treatment)  Dizziness/nausea with PT BP's soft Check orthostatics Only on small dose carvedilol and we are holding in AM of HD days HR 60's ? If mandatory to continue this Looks euvolemic No volume of with HD  S/P CABG Per TCTS rehab On coumadin Amio d/c'd   Anemia  On aranesp 60 QTues.  Tsat low.  Repleting with HD X 10 doses of ferrlecit (started 7/29)  Hb slowly improving  A fib, now SR.  Transient episode in HD 7/29 with RVR - back in NSR now On carvedilol low dose/off amio  Deconditioning  Rehab  CKD-MBD Now on binders Increased 2 TID w/meals (7/31) No PTH data (ordered)   Camille Bal, MD [facility] [phone] pager 06/06/2013, 11:29 AM

## 2013-06-06 NOTE — Progress Notes (Signed)
Physical Therapy Session Note  Patient Details  Name: [name] MRN: [id number] Date of Birth: 1933/11/26  Today's Date: 06/06/2013 Time: 1430-1500 Time Calculation (min): 30 min  Short Term Goals: Week 1:  PT Short Term Goal 1 (Week 1): Pt will be able to perform supin>sit with min A while maintaining sternal precautions PT Short Term Goal 2 (Week 1): Pt will be able to perform 75 ft gait in controlled environment with S using RW  PT Short Term Goal 3 (Week 1): Pt will be able to perform functional transfers with S while maintaining sternal precautions   Therapy Documentation Precautions:  Precautions: Sternal;Fall Precaution Comments:  (Pt able to recall 1/5 sternal precautions) Restrictions Weight Bearing Restrictions: Yes RUE Weight Bearing: Non weight bearing (sternal precautions) LUE Weight Bearing: Non weight bearing (sternal precautions) Vital Signs: Therapy Vitals Temp: 98 F (36.7 C) Temp src: Oral Pulse Rate: 68 Resp: 16 BP: 121/44 mmHg Patient Position, if appropriate: Lying Oxygen Therapy SpO2: 100 % O2 Device: Nasal cannula O2 Flow Rate (L/min): 2 L/min Pain: right hip pain (chronic arthritis) 6/10  Therapeutic Activity:(30') Ace wrapping of B LE's from feet to proximal thighs. Bed mobility supine<->sit via right logroll with Mod-A. Transfers sit<->stand from elevated surface into RW with min-Assist. Monitoring of BP and HR: (2L O2 via [place] throughout with O2 sats >/= 98% Position  BP  HR Supine   123/53  68 Sit 1   127/57  71 Sit 2   128/60  70 Stand   102/51  75 Sit 3   122/59  71  *Patient tolerated treatment session much better with only mild c/o light headedness with standing and mild nausea.   Therapy/Group: Individual Therapy  Rayona Sardinha J 06/06/2013, 2:52 PM

## 2013-06-06 NOTE — Progress Notes (Signed)
Physical Therapy Session Note  Patient Details  Name: [name] MRN: [id number] Date of Birth: 03-12-1934  Today's Date: 06/06/2013 Time: 0900-0930 Time Calculation (min): 30 min  Short Term Goals: Week 1:  PT Short Term Goal 1 (Week 1): Pt will be able to perform supin>sit with min A while maintaining sternal precautions PT Short Term Goal 2 (Week 1): Pt will be able to perform 75 ft gait in controlled environment with S using RW  PT Short Term Goal 3 (Week 1): Pt will be able to perform functional transfers with S while maintaining sternal precautions    Therapy Documentation Precautions:  Precautions: Sternal;Fall Precaution Comments:  (Pt able to recall 1/5 sternal precautions) Restrictions Weight Bearing Restrictions: Yes RUE Weight Bearing: Non weight bearing (because of sternal precautions.) LUE Weight Bearing: Non weight bearing (because of sternal precautions.) Vital Signs: Therapy Vitals Temp: 98 F (36.7 C) Temp src: Oral Pulse Rate: 68 Resp: 18 BP: 104/58 mmHg Oxygen Therapy SpO2: 99 % O2 Device: Nasal cannula O2 Flow Rate (L/min): 2 L/min Pain: Pain Score: 8  Pain Type: Acute pain Pain Location: Hip Pain Orientation: Right Pain Descriptors / Indicators: Aching Pain Frequency: Constant Pain Onset: On-going Pain Intervention(s): Medication (See eMAR)  Therapeutic Activity:(60') Bed Mobility rolling with min-Assist, supine<->sit Mod-Assist, sit<->stand with S/min-Assist.   With transfers supine->sit->stand had increased nausea and dizziness which decreased from severe to tolerable with sitting x 10 minutes EOB but increased with 30 seconds of standing.  Nursing notified.   Vitals: (2L O2 via [place])             supine BP 113/50, HR 72, O2 Sats 98%              Sit BP 99/48,   HR 72, O2 Sats 100%               Sit BP 96/59,   HR 74, O2 Sats 100%   Therapy/Group: Individual Therapy  Baya Lentz J 06/06/2013, 9:02 AM

## 2013-06-06 NOTE — Progress Notes (Signed)
Occupational Therapy Session Note  Patient Details  Name: [name] MRN: [id number] Date of Birth: 1934/08/22  Today's Date: 06/06/2013 Time: 6578-4696 Time Calculation (min): 45 min  Skilled Therapeutic Interventions/Progress Updates: PM session:   Attempted to go to gym and ADL apt for therapy but upon standing for vital signs with nursing, patient BP=94/48, and patient stated he did not feel well.   So session was halted and kept in room completing endurance training seat within patient comfort and within sternal precautions.  At end of session, patient was assisted back to bed to rest before next treatment session.  Son and wife present for the session.    Therapy Documentation Precautions:  Precautions Precautions: Sternal;Fall Precaution Comments:  (Pt able to recall 1/5 sternal precautions) Restrictions Weight Bearing Restrictions: Yes RUE Weight Bearing: Non weight bearing (sternal precautions) LUE Weight Bearing: Non weight bearing (sternal precautions) Vital Signs: 94/48 standing (at 1310 today)  Pain:4/10 hips and shoulders but tolerable per patient   See FIM for current functional status  Therapy/Group: Individual Therapy  [name] [facility] 06/06/2013, 4:18 PM

## 2013-06-07 ENCOUNTER — Inpatient Hospital Stay (HOSPITAL_COMMUNITY): Payer: Medicare Other | Admitting: *Deleted

## 2013-06-07 DIAGNOSIS — Z951 Presence of aortocoronary bypass graft: Secondary | ICD-10-CM

## 2013-06-07 DIAGNOSIS — N185 Chronic kidney disease, stage 5: Secondary | ICD-10-CM

## 2013-06-07 LAB — RENAL FUNCTION PANEL
CO2: 30 mEq/L (ref 19–32)
Chloride: 102 mEq/L (ref 96–112)
GFR calc Af Amer: 12 mL/min — ABNORMAL LOW (ref >90)
GFR calc non Af Amer: 10 mL/min — ABNORMAL LOW (ref >90)
Glucose, Bld: 80 mg/dL (ref 70–99)
Sodium: 139 mEq/L (ref 135–145)

## 2013-06-07 LAB — GLUCOSE, CAPILLARY
Glucose-Capillary: 67 mg/dL — ABNORMAL LOW (ref 70–99)
Glucose-Capillary: 81 mg/dL (ref 70–99)
Glucose-Capillary: 88 mg/dL (ref 70–99)
Glucose-Capillary: 94 mg/dL (ref 70–99)

## 2013-06-07 MED ORDER — SORBITOL 70 % SOLN
30.0000 mL | Freq: Every day | Status: DC | PRN
  Administered 2013-06-07 – 2013-06-09 (×2): 30 mL via ORAL
  Filled 2013-06-07 (×2): qty 30

## 2013-06-07 NOTE — Progress Notes (Signed)
Hypoglycemic Event  CBG: 67  Treatment: 15 GM carbohydrate snack  Symptoms: None  Follow-up CBG: Time:0115 CBG Result:81  Possible Reasons for Event: Inadequate meal intake  Comments/MD notified:Doctor not notified    [name]  Remember to initiate Hypoglycemia Order Set & complete

## 2013-06-07 NOTE — Progress Notes (Signed)
Iron IV dose not given during hemodialysis Tx due to pts hemodialysis Tx being stopped 1hr early due to a emergency dialysis Tx on another pt.

## 2013-06-07 NOTE — Progress Notes (Signed)
Occupational Therapy Note  Patient Details  Name: [name] MRN: [id number] Date of Birth: 27-Mar-1934 Today's Date: 06/07/2013  Time:  None Pain: nauseated and orthostatic Individual session   Pt. Missed 45 minutes of OT session due to nauseated and orthostatic.    Humberto Seals 06/07/2013, 5:21 PM

## 2013-06-07 NOTE — Progress Notes (Signed)
[facility] ROUNDING NOTE  Subjective:  Remains oligoanuric Had HD yesterday - got back to room about 1AM (delayed d/t emergencies) Says nauseous with treatment but good now Orthostatic last set of vitals (121/51-->87/64 with HR to 125 Was "no volume off with last HD"  Objective Vital signs in last 24 hours: Filed Vitals:   06/06/13 2355 06/07/13 0049 06/07/13 0536 06/07/13 0537  BP: 116/53 121/51 87/64 131/75  Pulse: 72 75 125 90  Temp: 97.7 F (36.5 C) 98.2 F (36.8 C) 97.6 F (36.4 C)   TempSrc: Oral Oral Oral   Resp: 18 17 20 18   Height:      Weight: 97.5 kg (214 lb 15.2 oz)  98.9 kg (218 lb 0.6 oz)   SpO2: 100% 99% 100% 98%   Weight change: 0.9 kg (1 lb 15.8 oz)  Intake/Output Summary (Last 24 hours) at 06/07/13 1111 Last data filed at 06/07/13 0900  Gross per 24 hour  Intake    600 ml  Output      5 ml  Net    595 ml   Physical Exam:  BP 131/75  Pulse 90  Temp(Src) 97.6 F (36.4 C) (Oral)  Resp 18  Ht 6' 0.84" (1.85 m)  Wt 98.9 kg (218 lb 0.6 oz)  BMI 28.9 kg/m2  SpO2 98% Awake and appropriate, watching TV Lungs clear Sternotomy scar healing  Abd soft and not tender Leg incisions healing No edema LE's Right IJ tunnelled HD cath dsg in place (05/26/13)    Recent Labs Lab 06/02/13 0518 06/03/13 0455 06/04/13 0610 06/04/13 1743 06/05/13 0600 06/06/13 0500 06/07/13 0521  NA 136 137 140 137 141 140 139  K 4.5 3.9 4.3 4.8 3.7 4.2 3.7  CL 98 98 101 97 103 100 102  CO2 22 26 27 27 29 28 30   GLUCOSE 91 81 89 89 78 81 80  BUN 72* 35* 53* 59* 23 37* 19  CREATININE 9.13* 6.06* 8.62* 9.24* 5.12* 7.46* 4.83*  CALCIUM 8.5 8.6 8.5 8.5 8.0* 8.2* 8.2*  PHOS 6.2* 4.2 5.9* 5.1* 4.1 4.5 3.1   Results for JDEN, WANT (MRN [id number]) as of 06/01/2013 07:32   Ref. Range  05/31/2013 10:33   Iron  Latest Range: 42-135 ug/dL  26 (L)   UIBC  Latest Range: 125-400 ug/dL  027   TIBC  Latest Range: 215-435 ug/dL  253   Saturation Ratios  Latest  Range: 20-55 %  10 (L)     Recent Labs Lab 06/05/13 0600 06/06/13 0500 06/07/13 0521  ALBUMIN 1.9* 2.0* 2.0*     Recent Labs Lab 06/02/13 0518 06/03/13 0455 06/04/13 1743 06/06/13 0500  WBC 8.3 7.7 7.9 6.9  HGB 7.7* 8.4* 8.3* 8.8*  HCT 26.4* 28.3* 28.2* 30.4*  MCV 85.2 86.3 87.0 89.7  PLT 282 269 287 230    Lab Results  Component Value Date   INR 2.20* 06/06/2013   INR 2.05* 06/05/2013   INR 2.14* 06/04/2013    Medications:   . atorvastatin  20 mg Oral q1800  . calcium acetate  667 mg Oral TID WC  . [START ON 06/09/2013] darbepoetin (ARANESP) injection - NON-DIALYSIS  60 mcg Subcutaneous Q Tue-1800  . feeding supplement  1 Container Oral TID BM  . ferric gluconate (FERRLECIT/NULECIT) IV  125 mg Intravenous Q T,Th,Sa-HD  . heparin  4,600 Units Intravenous Once  . insulin aspart  0-5 Units Subcutaneous QHS  . insulin aspart  0-9 Units Subcutaneous TID WC  .  levothyroxine  100 mcg Oral QAC breakfast  . multivitamin  1 tablet Oral Daily  . pantoprazole  40 mg Oral Daily  . senna-docusate  2 tablet Oral QHS  . sodium chloride  3 mL Intravenous Q12H  . warfarin  2.5 mg Oral q1800  . Warfarin - Pharmacist Dosing Inpatient   Does not apply q1800  acetaminophen, albuterol, bisacodyl, diphenhydrAMINE, guaiFENesin-dextromethorphan, HYDROcodone-acetaminophen, ondansetron (ZOFRAN) IV, ondansetron, sodium chloride, sorbitol, traZODone I  have reviewed scheduled and prn medications.   ASSESSMENT/RECOMMENDATIONS   79yo WM with baseline CKD (1.5-1.7) with oligoanuric ischemic ATN post CP/cardiogenic shock, cath, CABG (7/9), requiring renal replacement therapy since 7/17 with CRRT, transitioned to intermittent HD on 7/21, now with TDC placed 7/22. On TTS schedule with hopes of recovery of function.   AKI on CKD Dialysis dependent since 05/21/13.   Remains oligoanuric.  Too early to call ESRD.  Continue TTS schedule until evidence of recovery.  Next HD Tuesday let volume ease up  some  (Weights all over the place so unclear EDW but euvolemic to slightly dry based on exam and orthostatics at weight around 214-215 pounds based on last few)     Dizziness/nausea with PT BP's soft/orthostatic Only on small dose carvedilol and we are holding in AM of HD days On "dry side" of euvolemic Will let volume ease up some as above  S/P CABG Per TCTS rehab On coumadin Amio d/c'd   Anemia  On aranesp 60 QTues.  Tsat low.  Repleting with HD X 10 doses of ferrlecit (started 7/29)  Hb slowly improving  A fib, now SR.  Transient episode in HD 7/29 with RVR - back in NSR now On carvedilol low dose/off amio  Deconditioning  Rehab  CKD-MBD Now on binders Decrease 1 tid w/ meals (last phos 3.1) No PTH data (ordered)   Camille Bal, MD [facility] [phone] pager 06/07/2013, 11:11 AM

## 2013-06-07 NOTE — Progress Notes (Signed)
[name] is a 79 y.o. male 07-09-1934 [id number]  Subjective: C/o constipation. He is tired. C/o nausea w/HD. Slept well. Feeling OK.  Objective: Vital signs in last 24 hours: Temp:  [97.6 F (36.4 C)-98.6 F (37 C)] 97.6 F (36.4 C) (08/03 0536) Pulse Rate:  [66-125] 90 (08/03 0537) Resp:  [16-20] 18 (08/03 0537) BP: (87-132)/(43-75) 131/75 mmHg (08/03 0537) SpO2:  [98 %-100 %] 98 % (08/03 0537) Weight:  [214 lb 15.2 oz (97.5 kg)-218 lb 0.6 oz (98.9 kg)] 218 lb 0.6 oz (98.9 kg) (08/03 0536) Weight change: 1 lb 15.8 oz (0.9 kg) Last BM Date: 06/05/13  Intake/Output from previous day: 08/02 0701 - 08/03 0700 In: 720 [P.O.:720] Out: 5  Last cbgs: CBG (last 3)   Recent Labs  06/07/13 0115 06/07/13 0612 06/07/13 0732  GLUCAP 81 78 77     Physical Exam General: No apparent distress    HEENT: moist mucosa Lungs: Normal effort. Lungs clear to auscultation, no crackles or wheezes. Cardiovascular: Regular rate and rhythm, no edema Musculoskeletal:  No change from before Neurological: No new neurological deficits Wounds: N/A    Skin: clear Alert, cooperative   Lab Results: BMET    Component Value Date/Time   NA 139 06/07/2013 0521   K 3.7 06/07/2013 0521   CL 102 06/07/2013 0521   CO2 30 06/07/2013 0521   GLUCOSE 80 06/07/2013 0521   BUN 19 06/07/2013 0521   CREATININE 4.83* 06/07/2013 0521   CALCIUM 8.2* 06/07/2013 0521   GFRNONAA 10* 06/07/2013 0521   GFRAA 12* 06/07/2013 0521   CBC    Component Value Date/Time   WBC 6.9 06/06/2013 0500   RBC 3.39* 06/06/2013 0500   HGB 8.8* 06/06/2013 0500   HCT 30.4* 06/06/2013 0500   PLT 230 06/06/2013 0500   MCV 89.7 06/06/2013 0500   MCH 26.0 06/06/2013 0500   MCHC 28.9* 06/06/2013 0500   RDW 23.7* 06/06/2013 0500   LYMPHSABS 2.0 03/03/2011 0417   MONOABS 0.6 03/03/2011 0417   EOSABS 0.4 03/03/2011 0417   BASOSABS 0.0 03/03/2011 0417    Studies/Results: No results found.  Medications: I have reviewed the patient's current  medications.  Assessment/Plan:  1. DVT Prophylaxis/Anticoagulation: Pharmaceutical: Coumadin  2. Pain Management: Will discontinue hydrocodone and use tylenol prn for pain.  3. Mood: Has anxiety regarding renal status. Family reports confusion/lethargy past HD sessions. Will monitor and offer ego support. LCSW to follow for evaluation.  4. Neuropsych: This patient is capable of making decisions on his own behalf.  5. Acute on Chronic renal failure: Continues with poor urine output. Check daily weights, strict I & O. Continue renal diet. HD--T,T,Sa.  6. Anemia of chronic disease: On nulecit IV for n ow.  7. Atrial Fibrillation: Monitor with bid checks. Amiodarone d/c and coreg increased today. Monitor for bradycardia.  8. Peripheral edema: TEDs for use when OOB.  9. Constipation: PRN lax's. Asked for sorbitol     Length of stay, days: 4  Sonda Primes , MD 06/07/2013, 9:37 AM

## 2013-06-07 NOTE — Progress Notes (Signed)
Patient requesting "sorbitol" for constipation. LBM-08/01. When up this AM, patient complained of feeling dizzy, "I just dont feel good."  BP at that time=87/64. Rechecked after situated in bed and lying down-BP 131/75. Complained of feeling nauseated-PRN zofran given at 0550. Spot checked CBG at 0612-78. Wife at bedside. Will continue to monitor. [name] A

## 2013-06-08 ENCOUNTER — Inpatient Hospital Stay (HOSPITAL_COMMUNITY): Payer: Medicare Other | Admitting: Physical Therapy

## 2013-06-08 ENCOUNTER — Inpatient Hospital Stay (HOSPITAL_COMMUNITY): Payer: Medicare Other | Admitting: Occupational Therapy

## 2013-06-08 ENCOUNTER — Inpatient Hospital Stay (HOSPITAL_COMMUNITY): Payer: Medicare Other

## 2013-06-08 LAB — CBC
HCT: 31.5 % — ABNORMAL LOW (ref 39.0–52.0)
Hemoglobin: 9.1 g/dL — ABNORMAL LOW (ref 13.0–17.0)
MCH: 26 pg (ref 26.0–34.0)
MCHC: 28.9 g/dL — ABNORMAL LOW (ref 30.0–36.0)
RDW: 23 % — ABNORMAL HIGH (ref 11.5–15.5)

## 2013-06-08 LAB — GLUCOSE, CAPILLARY
Glucose-Capillary: 75 mg/dL (ref 70–99)
Glucose-Capillary: 100 mg/dL — ABNORMAL HIGH (ref 70–99)
Glucose-Capillary: 95 mg/dL (ref 70–99)

## 2013-06-08 LAB — PROTIME-INR
INR: 3.31 — ABNORMAL HIGH (ref 0.00–1.49)
Prothrombin Time: 32.4 seconds — ABNORMAL HIGH (ref 11.6–15.2)

## 2013-06-08 NOTE — Progress Notes (Signed)
Subjective:  No intermval events No meaningful UOP  Physical Exam:  Blood pressure 115/61, pulse 78, temperature 97.7 F (36.5 C), temperature source Oral, resp. rate 18, height 6' 0.84" (1.85 m), weight 96.7 kg (213 lb 3 oz), SpO2 91.00%. 24hI/O 840 in/0 out  GEN: NAD CV:RRR LUNGS: CTAB, nl wob ABD: s/ntnd SKIN: tunneled CVC noted, c/d/i NEURO: nonfocal  MEDS: ,notable: PhosLo 1tab w/ meals Aranesp 60 weekly Ferrlicit IV Warfarin  IMPRESSION 1. Anuric AKI 2/2 shock, ACS, contrast nephropathy; no evidence of recovery yet; on HD since 7/17 2. CKD with BL SCr 1.5-1.7 3. 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 Check renal US and daily bladder scans to ensure no obstruction or retention 2. Follow for now, but remains THS while anuric 3. Follow Hb, trending up.  Sabra Heck MD 06/08/2013, 1:06 PM   Recent Labs Lab 06/06/13 0500 06/07/13 0521 06/08/13 0530  NA 140 139 141  K 4.2 3.7 4.0  CL 100 102 102  CO2 28 30 30   GLUCOSE 81 80 80  BUN 37* 19 31*  CREATININE 7.46* 4.83* 7.41*  CALCIUM 8.2* 8.2* 8.5  PHOS 4.5 3.1 3.9    Recent Labs Lab 06/04/13 1743 06/06/13 0500 06/08/13 0530  WBC 7.9 6.9 6.3  HGB 8.3* 8.8* 9.1*  HCT 28.2* 30.4* 31.5*  MCV 87.0 89.7 90.0  PLT 287 230 240

## 2013-06-08 NOTE — Progress Notes (Signed)
Stage 2 area to sacrum-foam dressing applied. 1 PRN vicodin given at 0121 for complaint of "butt" pain, with relief. Patient refusing to turn, reports too painful to chest to stay on side. Removed O2- O2 sat=95% RA. Patient with productive cough-thick tan sputum. Chest incision and leg incisions without sign or symptoms of infection. Patient with orthostatic BP's. Complains of feeling dizzy and lightheaded when standing, then becomes nauseated. Taking zofran PRN. Using teds when OOB. May need to try abdominal binder.[name] A

## 2013-06-08 NOTE — Progress Notes (Signed)
Notes reviewed and accurately reflect treatment sessions.   

## 2013-06-08 NOTE — Progress Notes (Signed)
Physical Therapy Session Note  Patient Details  Name: [name] MRN: [id number] Date of Birth: [birth year]  Today's Date: 06/08/2013 Time: 6295-2841 Time Calculation (min): 35 min  Short Term Goals: Week 1:  PT Short Term Goal 1 (Week 1): Pt will be able to perform supin>sit with min A while maintaining sternal precautions PT Short Term Goal 2 (Week 1): Pt will be able to perform 75 ft gait in controlled environment with S using RW  PT Short Term Goal 3 (Week 1): Pt will be able to perform functional transfers with S while maintaining sternal precautions   Skilled Therapeutic Interventions/Progress Updates:    Sit <> supine during session with supervision and cues for sternal precautions; stand pivot transfers<> wheelchair with RW and min-guard assist cues for locking brakes. 6 Steps with Railing on Rt, ascending sideways up to Lt min-guard assist but good concentric/eccentric control noted.   SpO2 >91% on room air throughout session.   Therapy Documentation Precautions:  Precautions Precautions: Sternal;Fall Precaution Comments:  (Pt able to recall 1/5 sternal precautions) Restrictions Weight Bearing Restrictions: Yes RUE Weight Bearing: Non weight bearing (for sternal precautions) LUE Weight Bearing: Non weight bearing (for sternal precautions) Pain: Pain Assessment Pain Assessment: 0-10 Pain Score: 3   See FIM for current functional status  Therapy/Group: Individual Therapy  Wilhemina Bonito 06/08/2013, 12:21 PM

## 2013-06-08 NOTE — Progress Notes (Signed)
Social Work  Social Work Assessment and Plan  Patient Details  Name: [name] MRN: [id number] Date of Birth: 1934/06/05  Today's Date: 06/08/2013  Problem List:  Patient Active Problem List   Diagnosis Date Noted  . Physical deconditioning 06/04/2013  . Acute on chronic renal failure 06/04/2013  . S/P CABG x 4 06/02/2013  . Acute renal failure 06/02/2013  . Acute respiratory failure 05/13/2013  . Acute on chronic combined systolic and diastolic CHF, NYHA class 4 05/13/2013  . HCAP (healthcare-associated pneumonia) 05/13/2013  . NSTEMI (non-ST elevated myocardial infarction) 05/13/2013   Past Medical History:  Past Medical History  Diagnosis Date  . Cancer   . Hypertension   . Collagen vascular disease   . Anginal pain   . Depression   . Arthritis   . Peripheral edema     chronic/wears TEDs  . 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 Eye surgery    . Coronary artery bypass graft N/A 05/13/2013    Procedure: CORONARY ARTERY BYPASS GRAFTING (CABG);  Surgeon: Alleen Borne, MD;  Location: [facility] OR;  Service: Open Heart Surgery;  Laterality: N/A;  . Insertion of dialysis catheter N/A 05/26/2013    Procedure: INSERTION OF DIALYSIS CATHETER;  Surgeon: Sherren Kerns, MD;  Location: [facility] OR;  Service: Vascular;  Laterality: N/A;  right IJ   Social History:  reports that he has never smoked. He does not have any smokeless tobacco history on file. He reports that he does not drink alcohol or use illicit drugs.  Family / Support Systems Marital Status: Married How Long?: 50 yrs Patient Roles: Spouse;Parent Spouse/Significant Other: wife, Neizan Debruhl @ [phone] Children: son, Jurell Basista @ [phone];  son, Ziyan Schoon - both in [place].  One other son, Jonny Ruiz, died 2011/03/17 Anticipated Caregiver: Grandson 39 yo Stiven Kaspar and son Emilo Gras ([phone] Ability/Limitations of Caregiver: Wife can  provide supervision.  Son has 2 months FMLA left.  Grandson not working and can assist. Medical laboratory scientific officer: 24/7 Family Dynamics: close family - good support  Social History Preferred language: English Religion:  Cultural Background: NA Education: HS Read: Yes Write: Yes Employment Status: Retired Date Retired/Disabled/Unemployed: 1997 Fish farm manager Issues: none Guardian/Conservator: none   Abuse/Neglect Physical Abuse: Denies Verbal Abuse: Denies Sexual Abuse: Denies Exploitation of patient/patient's resources: Denies Self-Neglect: Denies  Emotional Status Pt's affect, behavior adn adjustment status: Pt very pleasant, lying in bed and easily answering questions.  Fully oriented.  Expresses gratitude that he is "still alive" and appears to have very good understanding of the extent of his surgery.  Denies any s/s of depression or anxiety.  Does admit much concern about his kidney function and obvious hope that he does not have to remain on HD long term. Recent Psychosocial Issues: Death of son two years ago. Pyschiatric History: none Substance Abuse History: none  Patient / Family Perceptions, Expectations & Goals Pt/Family understanding of illness & functional limitations: As noted, pt and family appear to have fairly good understanding of the multiple medical issues he has going on and that recovery will be long term.  Good understanding of need for CIR.  Of note, however, was asked to speak with pt later in the same day by therapist.  Tx noted pt very upset with ELOS of 10 days and expressing desire to leave today.  When I addressed this with  pt (family no longer in room), he stated he felt he could go home that day and did not seem to understand that he required continued medical management of both cardiac and renal issues.  Pt very upset -  Premorbid pt/family roles/activities: Pt very independent PTA.  Active in his garden and at home Anticipated changes in  roles/activities/participation: Pt will have endurance/ physical limitations for some time and may be on permanent HD which will severely affect his daily life.  Wife and sons will need to increase assistance to the husband/ father who may be resistent to assist. Pt/family expectations/goals: As noted,  earlier in day of interview, pt agreeable to CIR and goal at that time was to build his strength and hopes to get off of HD.  later in day (?sundowning), pt's only goal was to leave immediately and required redirection and support to stay.  Community Resources [employer]: None Premorbid Home Care/DME Agencies: None Transportation available at discharge: yes Resource referrals recommended: Neuropsychology;Psychology;Support group (specify)  Discharge Planning Living Arrangements: Spouse/significant other Support Systems: Spouse/significant other;Children;Other relatives;Friends/neighbors;Church/faith community Type of Residence: Private residence Insurance Resources: Administrator (specify) Herbalist) Financial Resources: Social Security Financial Screen Referred: No Living Expenses: Own Money Management: Patient Does the patient have any problems obtaining your medications?: No Home Management: pt and wife Patient/Family Preliminary Plans: Pt plans to d/c home with his wife as primary support, however, son also with approx 2 months remaining in FMLA Barriers to Discharge: Other (Comment) (Pt's own understanding of severity of medical issues) Social Work Anticipated Follow Up Needs: HH/OP Expected length of stay: 10 days  Clinical Impression Initially very pleasant, oriented gentleman lying in bed with wife and son at bedside.  Presents with good, general understanding of all of his medical issues and of why he is on CIR.  However, later in same day, pt expressing desire to leave immediatly and denies any issues that would prevent this.  Did respond to some general  explanations and attempts to calm him.  Concern that there may be some underlying dementia/ sundowning at play.  Will follow for support and d/c planning.  El Pile 06/08/2013, 7:20 AM

## 2013-06-08 NOTE — Progress Notes (Signed)
Occupational Therapy Session Notes  Patient Details  Name: [name] MRN: [id number] Date of Birth: 10-Dec-1933  Today's Date: 06/08/2013  Short Term Goals: Week 1:  OT Short Term Goal 1 (Week 1): STG=LTG secondary to ELOS  Skilled Therapeutic Interventions/Progress Updates:  Session Note #1: Time: 1000-1045 (45 mins) Pt reported 4/10 pain in R hip. Pt stated he received AM pain meds.  Upon entering room, pt supine in bed. Pt on room air (O2=91%). Pt->EOB then ambulated with R/W->w/c. Pt engaged in ADL retraining from w/c level at sink. RN notified of pt's skin. Pt sit<>stand for peri care, changing of dressing, and pulling up pants (close supervision). Pt experienced one episode of dizziness while standing. OT checked BP (100/60). OT waited until symptoms resided then continued with session. RN present during this time. Pt stated he did not want to wash his feet. OT donned bilateral thigh high TED hose. Pt refused to stay in w/c for lunch. Pt ambulated with R/W->bed. O2 stats checked intermittently (>90% room air). Pt requires max v.c's for safety and adherence to sternal precautions. At end of tx session, pt in "chair" position in bed. Call bell within reach. Pt's family present. OT instructed family to inform nrsg when they leave so nrsg can put BA on.  Session Note #2: Time: 1430-1500 (30 mins) Pt with no report of pain.  Upon entering room, pt supine in bed. Pt->EOB and c/o dizziness. OT checked BP (=117/71) and O2 (=95% on room air). Pt then ambulated with R/W->w/c. OT pushed pt->ADL apt. Skilled intervention focused on bed mobility, adherence to sternal precautions during fxal tasks, BLE strengthening (mini squats), sit<>stand, and overall activity tolerance. Pt c/o dizziness side-lying->sitting EOB. OT checked BP (=132/72). OT pushed pt->room. Pt ambulated with R/W->bed. OT recommending vestibular eval secondary to c/o dizziness with bed mobility. Pt requires max v.c's for adherence  to sternal precautions. At end of tx session, pt in "chair" position in bed. BA on and call bell within reach.  Therapy Documentation Precautions:  Precautions Precautions: Sternal;Fall Precaution Comments:  (Pt able to recall 1/5 sternal precautions) Restrictions Weight Bearing Restrictions: Yes RUE Weight Bearing: Non weight bearing (for sternal precautions) LUE Weight Bearing: Non weight bearing (for sternal precautions)  See FIM for current functional status  Therapy/Group: Individual Therapy  Dalton Molesworth 06/08/2013, 7:59 AM

## 2013-06-08 NOTE — Progress Notes (Signed)
Reviewed and in agreement with treatment provided.  

## 2013-06-08 NOTE — Plan of Care (Signed)
Problem: RH SKIN INTEGRITY Goal: RH STG SKIN FREE OF INFECTION/BREAKDOWN Pt will remain free of further skin breakdown while on rehab unit with min. assist  Outcome: Progressing Stg II to sacrum, no new skin issues noted

## 2013-06-08 NOTE — Progress Notes (Signed)
ANTICOAGULATION CONSULT NOTE - Follow Up Consult  Pharmacy Consult for Coumadin Indication: atrial fibrillation, hx of DVT  Allergies  Allergen Reactions  . 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    Patient Measurements: Height: 6' 0.83" (185 cm) Weight: 213 lb 3 oz (96.7 kg) IBW/kg (Calculated) : 79.52 Heparin Dosing Weight:   Vital Signs: Temp: 97.7 F (36.5 C) (08/04 0558) Temp src: Oral (08/04 0558) BP: 115/61 mmHg (08/04 0559) Pulse Rate: 78 (08/04 0559)  Labs:  Recent Labs  06/06/13 0500 06/07/13 0521 06/08/13 0530  HGB 8.8*  --  9.1*  HCT 30.4*  --  31.5*  PLT 230  --  240  LABPROT 23.7*  --  32.4*  INR 2.20*  --  3.31*  CREATININE 7.46* 4.83* 7.41*    Estimated Creatinine Clearance: 10.2 ml/min (by C-G formula based on Cr of 7.41).   Medications:  Scheduled:  . atorvastatin  20 mg Oral q1800  . calcium acetate  667 mg Oral TID WC  . [START ON 06/09/2013] darbepoetin (ARANESP) injection - NON-DIALYSIS  60 mcg Subcutaneous Q Tue-1800  . feeding supplement  1 Container Oral TID BM  . ferric gluconate (FERRLECIT/NULECIT) IV  125 mg Intravenous Q T,Th,Sa-HD  . heparin  4,600 Units Intravenous Once  . insulin aspart  0-5 Units Subcutaneous QHS  . insulin aspart  0-9 Units Subcutaneous TID WC  . levothyroxine  100 mcg Oral QAC breakfast  . multivitamin  1 tablet Oral Daily  . pantoprazole  40 mg Oral Daily  . senna-docusate  2 tablet Oral QHS  . sodium chloride  3 mL Intravenous Q12H  . warfarin  2.5 mg Oral q1800  . Warfarin - Pharmacist Dosing Inpatient   Does not apply q1800    Assessment: 79yo male with AFib and hx of DVT, on chronic Coumadin.  INR with large jump today; he received a total of 5mg  of Coumadin yesterday as he was in HD on 8/2 and dose given late.  No bleeding problems noted.  Goal of Therapy:  INR 2-3 Monitor platelets by anticoagulation protocol: Yes   Plan:  1.  D/C [place] 2.5mg  daily 2.  No Coumadin today 3.  Daily  INR  Marisue Humble, PharmD Clinical Pharmacist [facility]

## 2013-06-08 NOTE — Progress Notes (Signed)
Subjective/Complaints: Reports nausea with decreased bp's. Still concerned about LOS.  A 12 point review of systems has been performed and if not noted above is otherwise negative.   Objective: Vital Signs: Blood pressure 115/61, pulse 78, temperature 97.7 F (36.5 C), temperature source Oral, resp. rate 18, height 6' 0.84" (1.85 m), weight 96.7 kg (213 lb 3 oz), SpO2 97.00%. No results found.  Recent Labs  06/06/13 0500 06/08/13 0530  WBC 6.9 6.3  HGB 8.8* 9.1*  HCT 30.4* 31.5*  PLT 230 240    Recent Labs  06/07/13 0521 06/08/13 0530  NA 139 141  K 3.7 4.0  CL 102 102  GLUCOSE 80 80  BUN 19 31*  CREATININE 4.83* 7.41*  CALCIUM 8.2* 8.5   CBG (last 3)   Recent Labs  06/07/13 1644 06/07/13 2114 06/08/13 0721  GLUCAP 94 88 75    Wt Readings from Last 3 Encounters:  06/08/13 96.7 kg (213 lb 3 oz)  06/03/13 99.3 kg (218 lb 14.7 oz)  06/03/13 99.3 kg (218 lb 14.7 oz)    Physical Exam:  Constitutional: He is oriented to person, place, and time. He appears well-developed and well-nourished.  Keeps eyes closed  HENT:  Head: Normocephalic and atraumatic.  Eyes: Conjunctivae are normal. Pupils are equal, round, and reactive to light.  Neck: Normal range of motion. Neck supple. No thyromegaly present.  Cardiovascular: Normal rate. An irregularly irregular rhythm present. No murmur  Pulmonary/Chest: Effort normal and breath sounds normal. No respiratory distress. He has no wheezes.  Abdominal: Soft. Bowel sounds are normal. He exhibits no distension. There is no tenderness.  Musculoskeletal: He exhibits no edema and no tenderness. Right greater troch tender to touch.  Stasis changes BLE. Incisions BLE clean, dry And intact.  Neurological: He is oriented to person, place, and time. Appropriate conversationally. Used calendar to recall day. Able to follow simple commands without difficulty. Motor strength is 4/5 in left hip flexor knee extensor ankle dorsiflexor and  plantar flexor 3 minus right hip flexor, knee extensors, ankle dorsiflexor plantar flexor, 5/5 bilateral deltoid, bicep, tricep, grip. Decreased sensation right foot compared to left foot, but difference negligible   Assessment/Plan: 1. Functional deficits secondary to deconditioning after CABG/multiple medical issues which require 3+ hours per day of interdisciplinary therapy in a comprehensive inpatient rehab setting. Physiatrist is providing close team supervision and 24 hour management of active medical problems listed below. Physiatrist and rehab team continue to assess barriers to discharge/monitor patient progress toward functional and medical goals.  Discussed LOS with patient and reassured him that we expect only 7-10 days FIM: FIM - Bathing Bathing Steps Patient Completed: Chest;Right Arm;Left Arm;Abdomen;Front perineal area;Buttocks;Right upper leg;Left upper leg Bathing: 4: Min-Patient completes 8-9 52f 10 parts or 75+ percent  FIM - Upper Body Dressing/Undressing Upper body dressing/undressing steps patient completed: Thread/unthread right sleeve of front closure shirt/dress;Button/unbutton shirt;Pull shirt around back of front closure shirt/dress Upper body dressing/undressing: 5: Supervision: Safety issues/verbal cues FIM - Lower Body Dressing/Undressing Lower body dressing/undressing steps patient completed: Thread/unthread right underwear leg;Thread/unthread left underwear leg;Pull underwear up/down;Pull pants up/down;Fasten/unfasten pants;Don/Doff right shoe;Don/Doff left shoe Lower body dressing/undressing: 4: Min-Patient completed 75 plus % of tasks  FIM - Toileting Toileting steps completed by patient: Adjust clothing prior to toileting;Performs perineal hygiene;Adjust clothing after toileting Toileting Assistive Devices: Grab bar or rail for support Toileting: 5: Supervision: Safety issues/verbal cues  FIM - Diplomatic Services operational officer Devices: Grab  bars Toilet Transfers: 4-To toilet/BSC: Min A (steadying  Pt. > 75%);4-From toilet/BSC: Min A (steadying Pt. > 75%)  FIM - Bed/Chair Transfer Bed/Chair Transfer Assistive Devices: Therapist, occupational: 4: Bed > Chair or W/C: Min A (steadying Pt. > 75%);4: Chair or W/C > Bed: Min A (steadying Pt. > 75%)  FIM - Locomotion: Wheelchair Locomotion: Wheelchair: 1: Travels less than 50 ft with moderate assistance (Pt: 50 - 74%) (using B/L LE for propulsion ) FIM - Locomotion: Ambulation Locomotion: Ambulation Assistive Devices: Designer, industrial/product Ambulation/Gait Assistance: 4: Min guard Locomotion: Ambulation: 1: Travels less than 50 ft with minimal assistance (Pt.>75%)  Comprehension Comprehension Mode: Auditory Comprehension: 5-Follows basic conversation/direction: With no assist  Expression Expression Mode: Verbal Expression: 5-Expresses basic needs/ideas: With no assist  Social Interaction Social Interaction Mode: Asleep Social Interaction: 5-Interacts appropriately 90% of the time - Needs monitoring or encouragement for participation or interaction.  Problem Solving Problem Solving Mode: Asleep Problem Solving: 5-Solves basic problems: With no assist  Memory Memory Mode: Asleep Memory: 3-Recognizes or recalls 50 - 74% of the time/requires cueing 25 - 49% of the time  Medical Problem List and Plan:  1. DVT Prophylaxis/Anticoagulation: Pharmaceutical: Coumadin  2. Pain Management: Will discontinue hydrocodone and use tylenol prn for pain.  3. Mood: Has anxiety regarding renal status. Family reports confusion/lethargy past HD sessions. Will monitor and offer ego support. LCSW to follow for evaluation.  4. Neuropsych: This patient is capable of making decisions on his own behalf.  5. Acute on Chronic renal failure: Continues with poor urine output. Check daily weights, strict I & O. Continue renal diet. HD--T,T,Sa.  6. Anemia of chronic disease: On nulecit IV for n ow.  7. Atrial  Fibrillation: Monitor with bid checks. Amiodarone d/c and coreg increased today. Monitor for bradycardia.  8. Peripheral edema:  TEDs for use when OOB.  9. Constipation: PRN lax's.  10. Nausea:  Likely multifactorial related to renal/meds and constipation 11. Orthostasis:  -Volume mgt per nephrology  -TEDS/Abd binder  -he reports that nausea occurs with bp drop also    LOS (Days) 5 A FACE TO FACE EVALUATION WAS PERFORMED  [name],[name] T 06/08/2013 9:30 AM

## 2013-06-08 NOTE — Progress Notes (Signed)
Physical Therapy Session Note  Patient Details  Name: [name] MRN: [id number] Date of Birth: [birth year]  Today's Date: 06/08/2013 Time: 4098-1191 Time Calculation (min): 57 min  Short Term Goals: Week 1:  PT Short Term Goal 1 (Week 1): Pt will be able to perform supin>sit with min A while maintaining sternal precautions PT Short Term Goal 2 (Week 1): Pt will be able to perform 75 ft gait in controlled environment with S using RW  PT Short Term Goal 3 (Week 1): Pt will be able to perform functional transfers with S while maintaining sternal precautions   Skilled Therapeutic Interventions/Progress Updates:    Therapy session focused on gait training, bed mobility, and therapeutic exercises. Pt performed gait for 70 ft x 2, 50 ft x 2, and 25 ft x 2 (non-consecutively), with RW, close S, and VC's to widen BOS during gait. Pt required a seated rest break for 2 mins in between bouts of gait due to decreased activity tolerance and endurance.  Pt completed 4 inch step ups x 10 using RW for balance and close S for B/L LE strengthening to improve functional mobility and transfers. Pt completed therapeutic exercises that consisted of supine B/L heel slides (2 lbs), short arc quads (2 lbs), glut squeezes, and seated long arc quads (2 lbs) for muscular endurance and strengthening of B/L LE to improve independence with gait and transfers. Pt performed sit<> supine with min A (supine>sit) - close S (sit>supine) with VC's for sternal precautions. Pt able to perform sit to stand transfer with close S and VC's to UE placement on RW. Minimal c/o pain in the R hip (chronic OA) when attempting SLR's therefore exercise was discontinued. Pt continues to present with decreased activity tolerance and endurance however he was able to tolerate therapy session with the appropriate seated rest breaks. Abdominal binder was donned at beginning of therapy session and appears to be providing the appropriate pressure support as  BP remained within normal limits (123/74 mmHg; 126/72 mmhg) after donning the binder. SpO2 dropped to 88% on room air during gait however came back up to 95% with in 30 seconds of seated rest and diaphragmatic breathing.     Therapy Documentation Precautions:  Precautions Precautions: Sternal;Fall Precaution Comments:  (Pt able to recall 1/5 sternal precautions) Restrictions Weight Bearing Restrictions: Yes RUE Weight Bearing: Non weight bearing (for sternal precautions) LUE Weight Bearing: Non weight bearing (for sternal precautions)  See FIM for current functional status  Therapy/Group: Individual Therapy  [name], [name] 06/08/2013, 1:37 PM

## 2013-06-08 NOTE — Progress Notes (Signed)
Speech Language Pathology Daily Session Note  Patient Details  Name: [name] MRN: [id number] Date of Birth: 1934-03-24  Today's Date: 06/08/2013 Time: 1400-1430 Time Calculation (min): 30 min  Short Term Goals: Week 1: SLP Short Term Goal 1 (Week 1): Pt will use external aids to demonstrate orientation to person, time, location, and situation with Mod I. SLP Short Term Goal 2 (Week 1): Pt will demonstrate basic problem solving during functional task with Min cues. SLP Short Term Goal 3 (Week 1): Pt will sustain attention to functional task for 10 min with Min cues. SLP Short Term Goal 4 (Week 1): Pt will demonstrate adequate medication management using external aids with Min cues. SLP Short Term Goal 5 (Week 1): Pt will consume regular textures with thin liquids with no s/s of aspiration.  Skilled Therapeutic Interventions: Skilled treatment focused on cognitive goals. SLP facilitated session on medication management by reviewing medication list with pt, including names of medications, why they are taken, and when. Pt used his external aid with Mod multimodal cues to answer questions about his medications. Pt also used his schedule with Mod cues from SLP in order to answer questions regarding his appointments today. Of note, pt also reported that the textures he has been receiving on his trays are mildly difficult for him to chew, and that he no longer eats harder textures as he does not like to wear his dentures. Provided education regarding the different diet levels offered here. Recommend not to trial regular textures, as pt is expressing mild difficulty with consistencies on his Dys. 3 diet, and given knew information provided by pt today he likely was not consuming regular textures at home. Will continue to work on medication management.     FIM:  Comprehension Comprehension Mode: Auditory Comprehension: 5-Understands basic 90% of the time/requires cueing < 10% of the  time Expression Expression Mode: Verbal Expression: 5-Expresses basic needs/ideas: With extra time/assistive device Social Interaction Social Interaction: 5-Interacts appropriately 90% of the time - Needs monitoring or encouragement for participation or interaction. Problem Solving Problem Solving: 5-Solves basic 90% of the time/requires cueing < 10% of the time Memory Memory: 3-Recognizes or recalls 50 - 74% of the time/requires cueing 25 - 49% of the time FIM - Eating Eating Activity: 5: Supervision/cues  Pain Pain Assessment Pain Assessment: No/denies pain  Therapy/Group: Individual Therapy  Maxcine Ham 06/08/2013, 3:13 PM  Maxcine Ham, M.A. CCC-SLP

## 2013-06-09 ENCOUNTER — Inpatient Hospital Stay (HOSPITAL_COMMUNITY): Payer: Medicare Other

## 2013-06-09 ENCOUNTER — Inpatient Hospital Stay (HOSPITAL_COMMUNITY): Payer: Medicare Other | Admitting: Physical Therapy

## 2013-06-09 LAB — RENAL FUNCTION PANEL
Albumin: 2.1 g/dL — ABNORMAL LOW (ref 3.5–5.2)
CO2: 30 mEq/L (ref 19–32)
CO2: 23 mEq/L (ref 19–32)
Calcium: 8.5 mg/dL (ref 8.4–10.5)
Chloride: 102 mEq/L (ref 96–112)
Chloride: 98 mEq/L (ref 96–112)
Creatinine, Ser: 7.41 mg/dL — ABNORMAL HIGH (ref 0.50–1.35)
GFR calc Af Amer: 7 mL/min — ABNORMAL LOW (ref >90)
GFR calc Af Amer: 5 mL/min — ABNORMAL LOW (ref >90)
GFR calc non Af Amer: 6 mL/min — ABNORMAL LOW (ref >90)
GFR calc non Af Amer: 4 mL/min — ABNORMAL LOW (ref >90)
Glucose, Bld: 97 mg/dL (ref 70–99)
Potassium: 4 mEq/L (ref 3.5–5.1)
Sodium: 141 mEq/L (ref 135–145)
Sodium: 135 mEq/L (ref 135–145)

## 2013-06-09 LAB — GLUCOSE, CAPILLARY: Glucose-Capillary: 87 mg/dL (ref 70–99)

## 2013-06-09 LAB — PROTIME-INR
INR: 3.27 — ABNORMAL HIGH (ref 0.00–1.49)
Prothrombin Time: 32.1 seconds — ABNORMAL HIGH (ref 11.6–15.2)

## 2013-06-09 MED ORDER — NEPRO/CARBSTEADY PO LIQD
237.0000 mL | ORAL | Status: DC | PRN
  Filled 2013-06-09: qty 237

## 2013-06-09 MED ORDER — PENTAFLUOROPROP-TETRAFLUOROETH EX AERO: 1.0000 "application " | INHALATION_SPRAY | CUTANEOUS | Status: DC | PRN

## 2013-06-09 MED ORDER — ALTEPLASE 2 MG IJ SOLR
2.0000 mg | Freq: Once | INTRAMUSCULAR | Status: DC | PRN
  Filled 2013-06-09: qty 2

## 2013-06-09 MED ORDER — LIDOCAINE-PRILOCAINE 2.5-2.5 % EX CREA
1.0000 "application " | TOPICAL_CREAM | CUTANEOUS | Status: DC | PRN
  Filled 2013-06-09: qty 5

## 2013-06-09 MED ORDER — SODIUM CHLORIDE 0.9 % IV SOLN: 100.0000 mL | INTRAVENOUS | Status: DC | PRN

## 2013-06-09 MED ORDER — ANTICOAGULANT SODIUM CITRATE 4% (200MG/5ML) IV SOLN
5.0000 mL | Status: DC | PRN
  Filled 2013-06-09: qty 250

## 2013-06-09 MED ORDER — BOOST / RESOURCE BREEZE PO LIQD: 1.0000 | Freq: Three times a day (TID) | ORAL | Status: DC | PRN

## 2013-06-09 MED ORDER — LIDOCAINE HCL (PF) 1 % IJ SOLN: 5.0000 mL | INTRAMUSCULAR | Status: DC | PRN

## 2013-06-09 NOTE — Procedures (Signed)
I was present at this dialysis session. I have reviewed the session itself and made appropriate changes.   Sabra Heck  MD 06/09/2013, 4:23 PM

## 2013-06-09 NOTE — Progress Notes (Addendum)
INITIAL NUTRITION ASSESSMENT  DOCUMENTATION CODES Per approved criteria  -Not Applicable   INTERVENTION: Downgrade diet to Dysphagia 3 2/2 patient's preferences given lack of dentition. Pt refusing Resource Breeze - will make PRN. Pt refusing snacks, will assess if pt would like at follow-up. Discussed need for nutrition to maximize participation in therapies. Encouraged oral intake. Agree with diet liberalization/removal of renal restrictions. Consider alternative anti-nausea medication, pt reports zofran does not provide him with relief. RD to continue to monitor for need for diet education.  NUTRITION DIAGNOSIS: Inadequate oral intake related to poor appetite and nausea as evidenced by poor meal completion.  Food- and nutrition-related knowledge deficit r/t lack of prior diet education AEB pt report.  Goal: Intake to meet >90% of estimated nutrition needs. Verbalize basic understanding of diet education.  Monitor:  weight trends, lab trends, I/O's, PO intake, supplement tolerance, need for further education  Reason for Assessment: MD Consult for Food Choices/Poor PO Intake  79 y.o. male  Admitting Dx: Physical deconditioning  ASSESSMENT: Admitted to rehab 2/2 deconditioning s/p hospitalization. Pt required cardiac cath and emergent CABG during his acute hospitalization. Currently receiving ongoing intermittent HD, being followed by renal.  RD consulted for food choices. Pt reports that he has had ongoing nausea today that was not relieved by zofran, per RN. Pt has had regular bowel movements. PA liberalized patient's diet to Heart Healthy. Pt complaining that he is unable to chew his foods - RD to downgrade diet accordingly. Pt refusing supplements (currently has [employer] ordered) and snacks in between meals.   Most recent potassium and phosphorus WNL.  Height: Ht Readings from Last 1 Encounters:  06/05/13 6' 0.83" (1.85 m)    Weight: Wt Readings from Last 1  Encounters:  06/09/13 216 lb 7.9 oz (98.2 kg)    Ideal Body Weight: 178 lb  % Ideal Body Weight: 121%  Wt Readings from Last 10 Encounters:  06/09/13 216 lb 7.9 oz (98.2 kg)  06/03/13 218 lb 14.7 oz (99.3 kg)  06/03/13 218 lb 14.7 oz (99.3 kg)  06/03/13 218 lb 14.7 oz (99.3 kg)  06/03/13 218 lb 14.7 oz (99.3 kg)  01/06/12 235 lb (106.595 kg)    Usual Body Weight: 235 lb  % Usual Body Weight: 92%  BMI:  Body mass index is 28.69 kg/(m^2). Overweight.  Estimated Nutritional Needs: Kcal: 1800 - 2100 Protein: 75 - 95 g Fluid: 2 liters daily  Skin:  Stage II sacrum Abdomen incision Sternal incision Leg incision x 2 Neck incision  Diet Order: Cardiac  EDUCATION NEEDS: -Education not appropriate at this time   Intake/Output Summary (Last 24 hours) at 06/09/13 1003 Last data filed at 06/09/13 0800  Gross per 24 hour  Intake    240 ml  Output      0 ml  Net    240 ml    Last BM: 8/4  Labs:   Recent Labs Lab 06/06/13 0500 06/07/13 0521 06/08/13 0530  NA 140 139 141  K 4.2 3.7 4.0  CL 100 102 102  CO2 28 30 30   BUN 37* 19 31*  CREATININE 7.46* 4.83* 7.41*  CALCIUM 8.2* 8.2* 8.5  PHOS 4.5 3.1 3.9  GLUCOSE 81 80 80    CBG (last 3)   Recent Labs  06/08/13 1637 06/08/13 2120 06/09/13 0730  GLUCAP 77 95 87    Scheduled Meds: . atorvastatin  20 mg Oral q1800  . calcium acetate  667 mg Oral TID WC  . darbepoetin (  ARANESP) injection - NON-DIALYSIS  60 mcg Subcutaneous Q Tue-1800  . feeding supplement  1 Container Oral TID BM  . ferric gluconate (FERRLECIT/NULECIT) IV  125 mg Intravenous Q T,Th,Sa-HD  . heparin  4,600 Units Intravenous Once  . insulin aspart  0-5 Units Subcutaneous QHS  . insulin aspart  0-9 Units Subcutaneous TID WC  . levothyroxine  100 mcg Oral QAC breakfast  . multivitamin  1 tablet Oral Daily  . pantoprazole  40 mg Oral Daily  . senna-docusate  2 tablet Oral QHS  . sodium chloride  3 mL Intravenous Q12H  . Warfarin -  Pharmacist Dosing Inpatient   Does not apply q1800    Continuous Infusions:   Past Medical History  Diagnosis Date  . Cancer   . Hypertension   . Collagen vascular disease   . Anginal pain   . Depression   . Arthritis   . Peripheral edema     chronic/wears TEDs  . 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 Eye surgery    . Coronary artery bypass graft N/A 05/13/2013    Procedure: CORONARY ARTERY BYPASS GRAFTING (CABG);  Surgeon: Alleen Borne, MD;  Location: [facility] OR;  Service: Open Heart Surgery;  Laterality: N/A;  . Insertion of dialysis catheter N/A 05/26/2013    Procedure: INSERTION OF DIALYSIS CATHETER;  Surgeon: Sherren Kerns, MD;  Location: [facility] OR;  Service: Vascular;  Laterality: N/A;  right IJ    Jarold Motto MS, RD, LDN Pager: [phone] After-hours pager: [phone]

## 2013-06-09 NOTE — Progress Notes (Signed)
Pt /co of nausea; he was given Zofran 4mg  and the nausea is not resolving. Requested to stop tx d/t to the nausea with 39 mins left. Called and informed nephrologist Dr. Camille Bal and she ordered to to stop tx; pt given and signed  the early termination form.

## 2013-06-09 NOTE — Progress Notes (Signed)
27M with anuric dialysis dependent AKI since 7/17 in setting of ACS/cardiogenic shock, contrat exposure  Subjective:  Some nausea today Scan UOP Renal US without obstruction, consistent with medical renal disease  Current meds: reviewed, includes PhosLo, Aranesp, nulecit,  Current Labs: reviewed  Physical Exam:  Blood pressure 127/74, pulse 85, temperature 97.9 F (36.6 C), temperature source Oral, resp. rate 18, height 6' 0.84" (1.85 m), weight 98.2 kg (216 lb 7.9 oz), SpO2 95.00%. 24h I/O: Very little UOP NAD RRR, nl s1s2 no rub CTAB nl wob S/nt/nd Aaox3. nonfocal   Assessment/Plan 1. Anuric AKI on HD since 07/17: HD today.  No evidence yet of renal recovery.  NO evidence of obstruction 2. CKD BMD: cont PhosLo.  Acceptable Ca/Phos 3. Anemia: cont aranesp, nulecit 4. CKD: will watch and hope for some recovery of renal function  Sabra Heck MD 06/09/2013, 2:02 PM   Recent Labs Lab 06/06/13 0500 06/07/13 0521 06/08/13 0530  NA 140 139 141  K 4.2 3.7 4.0  CL 100 102 102  CO2 28 30 30   GLUCOSE 81 80 80  BUN 37* 19 31*  CREATININE 7.46* 4.83* 7.41*  CALCIUM 8.2* 8.2* 8.5  PHOS 4.5 3.1 3.9    Recent Labs Lab 06/04/13 1743 06/06/13 0500 06/08/13 0530  WBC 7.9 6.9 6.3  HGB 8.3* 8.8* 9.1*  HCT 28.2* 30.4* 31.5*  MCV 87.0 89.7 90.0  PLT 287 230 240    Current Facility-Administered Medications  Medication Dose Route Frequency Provider Last Rate Last Dose  . acetaminophen (TYLENOL) tablet 325-650 mg  325-650 mg Oral Q4H PRN Jacquelynn Cree, PA-C   650 mg at 06/03/13 2033  . albuterol (PROVENTIL) (5 MG/ML) 0.5% nebulizer solution 2.5 mg  2.5 mg Nebulization Q6H PRN Jacquelynn Cree, PA-C      . atorvastatin (LIPITOR) tablet 20 mg  20 mg Oral q1800 Evlyn Kanner Love, PA-C   20 mg at 06/08/13 1749  . bisacodyl (DULCOLAX) suppository 10 mg  10 mg Rectal Daily PRN Jacquelynn Cree, PA-C      . calcium acetate (PHOSLO) capsule 667 mg  667 mg Oral TID WC Evlyn Kanner Love, PA-C   667 mg  at 06/08/13 1749  . darbepoetin (ARANESP) injection 60 mcg  60 mcg Subcutaneous Q Tue-1800 Pamela S Love, PA-C      . diphenhydrAMINE (BENADRYL) 12.5 MG/5ML elixir 12.5-25 mg  12.5-25 mg Oral Q6H PRN Jacquelynn Cree, PA-C      . feeding supplement (RESOURCE BREEZE) liquid 1 Container  1 Container Oral TID BM PRN Haynes Bast, RD      . ferric gluconate (NULECIT) 125 mg in sodium chloride 0.9 % 100 mL IVPB  125 mg Intravenous Q T,Th,Sa-HD Evlyn Kanner Love, PA-C   125 mg at 06/04/13 2023  . guaiFENesin-dextromethorphan (ROBITUSSIN DM) 100-10 MG/5ML syrup 15 mL  15 mL Oral Q4H PRN Jacquelynn Cree, PA-C      . heparin injection 4,600 Units  4,600 Units Intravenous Once Sadie Haber, MD      . HYDROcodone-acetaminophen (NORCO/VICODIN) 5-325 MG per tablet 1-2 tablet  1-2 tablet Oral Q4H PRN Jacquelynn Cree, PA-C   2 tablet at 06/08/13 2103  . insulin aspart (novoLOG) injection 0-5 Units  0-5 Units Subcutaneous QHS Pamela S Love, PA-C      . insulin aspart (novoLOG) injection 0-9 Units  0-9 Units Subcutaneous TID WC Evlyn Kanner Love, PA-C      . levothyroxine (SYNTHROID, LEVOTHROID) tablet 100 mcg  100 mcg Oral QAC breakfast Jacquelynn Cree, PA-C   100 mcg at 06/09/13 4098  . multivitamin (RENA-VIT) tablet 1 tablet  1 tablet Oral Daily Jacquelynn Cree, PA-C   1 tablet at 06/08/13 1191  . ondansetron (ZOFRAN) tablet 4 mg  4 mg Oral Q6H PRN Jacquelynn Cree, PA-C   4 mg at 06/09/13 0701   Or  . ondansetron [facility]) injection 4 mg  4 mg Intravenous Q6H PRN Jacquelynn Cree, PA-C      . pantoprazole (PROTONIX) EC tablet 40 mg  40 mg Oral Daily Jacquelynn Cree, PA-C   40 mg at 06/08/13 4782  . senna-docusate (Senokot-S) tablet 2 tablet  2 tablet Oral QHS Jacquelynn Cree, PA-C   2 tablet at 06/08/13 2102  . sodium chloride 0.9 % injection 3 mL  3 mL Intravenous Q12H Pamela S Love, PA-C      . sodium chloride 0.9 % injection 3 mL  3 mL Intravenous PRN Evlyn Kanner Love, PA-C      . sorbitol 70 % solution 30 mL  30 mL Oral Daily PRN  Tresa Garter, MD   30 mL at 06/07/13 1036  . traZODone (DESYREL) tablet 25-50 mg  25-50 mg Oral QHS PRN Jacquelynn Cree, PA-C      . Warfarin - Pharmacist Dosing Inpatient   Does not apply q1800 Riki Rusk, [facility]

## 2013-06-09 NOTE — Progress Notes (Signed)
Physical Therapy Session Note  Patient Details  Name: [name] MRN: [id number] Date of Birth: 1934-03-07  Today's Date: 06/09/2013 Time: 1445-1530 Time Calculation (min): 45 min  Short Term Goals: Week 1:  PT Short Term Goal 1 (Week 1): Pt will be able to perform supin>sit with min A while maintaining sternal precautions PT Short Term Goal 2 (Week 1): Pt will be able to perform 75 ft gait in controlled environment with S using RW  PT Short Term Goal 3 (Week 1): Pt will be able to perform functional transfers with S while maintaining sternal precautions   Skilled Therapeutic Interventions/Progress Updates:    Therapy session focused on bed mobility, therapeutic exercises, stair and gait training. Pt performed bed mobility (supine<>sit) with min A and VC's for UE positioning to come to sit. Pt completed standing therapeutic exercises that consisted of heel raises, hip ABD, and marches with use of UE support and close S (1 set x 10 reps) for LE strengthening to improve functional mobility. Pt performed 3 stairs x 2 (non-consecutively) with min A - close S using one hand rail (L sided). Steps were negotiated in a step-to pattern however pt demonstrated good eccentric control with descent. Pt completed multiple bouts of gait that consisted of 60 ft, 40 ft, 91 ft, and 25 ft with min A-close S requiring seated rest breaks in between bouts due to dizziness and fatigue. Pt required min A - S with sit >stand while maintaining sternal precautions. Pt reports c/o dizziness during rest and functional mobility that he reports as constant however vitals remained stable during therapy session (BP:145/68 mmHg; SpO2: 98%; HR 89 BPM). Pt c/o of fatigue and "being give out" during therapy session however was willing to fully participate in therapy with seated rest breaks as needed. Minimal c/o pain along the right lateral ribs that patient states resolves when abdominal binder is removed, therefore pt may benefit  from a larger binder.   Therapy Documentation Precautions:  Precautions Precautions: Sternal;Fall Precaution Comments:  (Pt able to recall 1/5 sternal precautions) Restrictions Weight Bearing Restrictions: Yes RUE Weight Bearing: Non weight bearing (for sternal precautions) LUE Weight Bearing: Non weight bearing (for sternal precautions)  See FIM for current functional status  Therapy/Group: Individual Therapy  [name], Maylani Embree 06/09/2013, 3:16 PM

## 2013-06-09 NOTE — Progress Notes (Signed)
Physical Therapy Assessment and Plan  Patient Details  Name: [name] MRN: [id number] Date of Birth: [birth year]  PT Diagnosis: BPPV? and Vertigo   Today's Date: 06/09/2013 Time: 4098-1191 Time Calculation (min): 55 min  Problem List:  Patient Active Problem List   Diagnosis Date Noted  . Physical deconditioning 06/04/2013  . Acute on chronic renal failure 06/04/2013  . S/P CABG x 4 06/02/2013  . Acute renal failure 06/02/2013  . Acute respiratory failure 05/13/2013  . Acute on chronic combined systolic and diastolic CHF, NYHA class 4 05/13/2013  . HCAP (healthcare-associated pneumonia) 05/13/2013  . NSTEMI (non-ST elevated myocardial infarction) 05/13/2013    Past Medical History:  Past Medical History  Diagnosis Date  . Cancer   . Hypertension   . Collagen vascular disease   . Anginal pain   . Depression   . Arthritis   . Peripheral edema     chronic/wears TEDs  . 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 Eye surgery    . Coronary artery bypass graft N/A 05/13/2013    Procedure: CORONARY ARTERY BYPASS GRAFTING (CABG);  Surgeon: Alleen Borne, MD;  Location: [facility] OR;  Service: Open Heart Surgery;  Laterality: N/A;  . Insertion of dialysis catheter N/A 05/26/2013    Procedure: INSERTION OF DIALYSIS CATHETER;  Surgeon: Sherren Kerns, MD;  Location: [facility] OR;  Service: Vascular;  Laterality: N/A;  right IJ    Assessment & Plan Clinical Impression: 79 y.o. year old male with recent admission to the hospital on 7/9 with CP and recurrent a flutter with positive troponin. His hospital course progressed quickly with him needing cardiac cath on 7/9 and eventually semi emergent CABG that same day. Post op with hypotension and declining renal function with decrease in UOP. Noted to be volume overloaded as well as somnolence on 07/14. Renal consulted and patient failed diuretic challenge requiring  dialysis support. Started on IV heparin for persistent A Fib.ST evaluation done 07/18 due to concerns of dysphagia and FEES done mild oropharyngeal stage dysphagia--D3, nectar liquids recommended due to intermittent lethargy and deconditioning--advanced to thins. HD ongoing but limited due to low BP on TTS schedule with hopes of recovery in the future. IJ placed on 07/22. Pt has been limited by dizziness and nausea with mobility therefore vestibular evaluation requested by therapy team.  Patient transferred to CIR on 06/03/2013 .   Pt reports dizziness that started after coming into hospital, reports worsened by out of bed mobility. Pt reports it only resolves by laying in bed "for a while." See evaluation findings below:  Eye Alignment  WNL  Spontaneous  Nystagmus Negative  Gaze holding nystagmus Negative  Smooth pursuit WFL  Oculomotor WFL however pt reports difficulty, ? Impaired attention  Saccades A little slow  VOR slow Not testsed  Head Thrust Test Unable to test appropriately due to neck stiffness  Head Shaking Nystagmus Not tested  Rt. Hallpike Dix *Modified sidelying position due to chest pain in standard testing position. Pt dizzy (same as upright sitting) NO NYSTAGMUS  Lt. Hallpike Dix                      "  Rt. Roll Test Unable to test accurately.   Lt. Roll Test                      "  Motion sensitivity Pt reports being "bothered" by driving down hallway with busy lateral visual fields      Visual- Vestibular Interactions:  - Sitting: Impaired, increased symptoms with VOR x 1 both horizontal and vertical.  - Standing: Impaired, increased symptoms with VOR x 1; pt had to sit after exercises due to increase dizziness.  - Walking: without VOR exercise pt is close supervision but reports unsteadiness.   Evaluation somewhat limited by sternal precautions and chest soreness however pt did not present with any nystagmus this session although does report lightheadedness and room  movement dizziness. Position changes and head movements do result in symptoms with BP WFL (147/78; 136/80). Feel pt may have vestibular hypofunction as result of prior bout with BPPV. The severity of symptoms is worrisome for continued BPPV however with no nystagmus present I am unable to determine what side is effected or even if it is present at all. My other concern is that he may have central cause for vertigo after chart review, dizziness seemed to appear shortly after confusion and swallowing difficulties started while on acute. I will continue to follow and reassess treatments, recommend slow progression of treatment. Primary treatment team made aware.   Recommendations:  1] VOR x 1 exercises in horizontal plane in sitting and standing positions with blank visual field behind target. Start at ~15 sec and work up to 60 sec duration. Recommend increasing symptoms by 2/10 increment change then stopping to rest until dizziness back to baseline (may be difficult to assess due to dizziness).   2] Francee Piccolo Daroff exercises if tolerated. Start on hospital bed as it is more comfortable. Pt may not be able to perform due to pain or difficulty maintaining precautions.       Patient will benefit from continued vestibular PT intervention to maximize safe functional mobility, minimize fall risk and decrease caregiver burden.    Skilled Therapeutic Intervention - VORx 1 horizontal and vertical fields in sitting and standing cues for speed and duration. Cues for attention to task and keeping eyes on target.  - Stand pivot transfers x 2 with min-guard assist.   PT Evaluation Pain  chest pain, soreness ~4/10; repositioned.      Refer to Care Plan for Long Term Goals  Recommendations for other services: None  Discharge Criteria: Patient will be discharged from PT if patient refuses treatment 3 consecutive times without medical reason, if treatment goals not met, if there is a change in medical status,  if patient makes no progress towards goals or if patient is discharged from hospital.  The above assessment, treatment plan, treatment alternatives and goals were discussed and mutually agreed upon: by patient  Wilhemina Bonito 06/09/2013, 3:19 PM

## 2013-06-09 NOTE — Progress Notes (Signed)
Speech Language Pathology Daily Session Note  Patient Details  Name: [name] MRN: [id number] Date of Birth: 01-31-1934  Today's Date: 06/09/2013 Time: 0900-0930 Time Calculation (min): 30 min  Short Term Goals: Week 1: SLP Short Term Goal 1 (Week 1): Pt will use external aids to demonstrate orientation to person, time, location, and situation with Mod I. SLP Short Term Goal 2 (Week 1): Pt will demonstrate basic problem solving during functional task with Min cues. SLP Short Term Goal 3 (Week 1): Pt will sustain attention to functional task for 10 min with Min cues. SLP Short Term Goal 4 (Week 1): Pt will demonstrate adequate medication management using external aids with Min cues. SLP Short Term Goal 5 (Week 1): Pt will consume regular textures with thin liquids with no s/s of aspiration.  Skilled Therapeutic Interventions: Skilled treatment focused on cognitive goals. SLP facilitated session with review of schedule, providing supervision level question cues for orientation questions. SLP introduced pillboxes, and pt chose an appropriate pillbox based on the frequency of his medications with Mod question cues. Pt did not require cues for redirection to task across approximately 10 minutes. Unfortunately, pt was unable to complete the remaining 15 minutes of the session due to nausea and dizziness impeding his ability to participate further. Continue plan of care.   FIM:  Comprehension Comprehension Mode: Auditory Comprehension: 4-Understands basic 75 - 89% of the time/requires cueing 10 - 24% of the time Expression Expression Mode: Verbal Expression: 5-Expresses basic needs/ideas: With extra time/assistive device Social Interaction Social Interaction: 4-Interacts appropriately 75 - 89% of the time - Needs redirection for appropriate language or to initiate interaction. Problem Solving Problem Solving: 4-Solves basic 75 - 89% of the time/requires cueing 10 - 24% of the  time Memory Memory: 3-Recognizes or recalls 50 - 74% of the time/requires cueing 25 - 49% of the time  Pain Pain Assessment Pain Assessment: No/denies pain  Therapy/Group: Individual Therapy  Maxcine Ham 06/09/2013, 9:37 AM  Maxcine Ham, M.A. CCC-SLP

## 2013-06-09 NOTE — Progress Notes (Signed)
Occupational Therapy Session Note  Patient Details  Name: [name] MRN: [id number] Date of Birth: 1934-01-25  Today's Date: 06/09/2013 Time: 0800-0847 Time Calculation (min): 47 min  Short Term Goals: Week 1:  OT Short Term Goal 1 (Week 1): STG=LTG secondary to ELOS  Skilled Therapeutic Interventions/Progress Updates:    Pt resting in bed upon arrival.  Pt c/o n/v this morning but agreeable to engaging in bathing and dressing tasks w/c level at sink.  Pt amb with RW to w/c at sink to complete all tasks.  Pt c/o slight dizziness with activity.  Pt's Ted hose donned and abdominal binder applied after dressing.  Pt continued to c/o dizziness.  BP sitting at 152/84 but unable to obtain BP while standing (pt could not stand long enough secondary to increasing dizziness). Pt requested to return to bed after bathing and dressing.  RN notified of pt's increased dizziness.  Therapy Documentation Precautions:  Precautions Precautions: Sternal;Fall Precaution Comments:  (Pt able to recall 1/5 sternal precautions) Restrictions Weight Bearing Restrictions: Yes RUE Weight Bearing: Non weight bearing (for sternal precautions) LUE Weight Bearing: Non weight bearing (for sternal precautions) General: General Missed Time Reason: Patient ill (comment) (c/o n/v and dizziness/light headed) Pain: Pain Assessment Pain Assessment: No/denies pain  See FIM for current functional status  Therapy/Group: Individual Therapy  Rich Brave 06/09/2013, 8:55 AM

## 2013-06-09 NOTE — Progress Notes (Signed)
Reviewed and in agreement with treatment provided.  

## 2013-06-09 NOTE — Progress Notes (Signed)
Subjective/Complaints: Nauseas this am. Had no problems yesterday. He's in no distress otherwise A 12 point review of systems has been performed and if not noted above is otherwise negative.   Objective: Vital Signs: Blood pressure 127/74, pulse 85, temperature 97.9 F (36.6 C), temperature source Oral, resp. rate 18, height 6' 0.84" (1.85 m), weight 98.2 kg (216 lb 7.9 oz), SpO2 95.00%. US Renal  06/08/2013   *RADIOLOGY REPORT*  Clinical Data: No acute kidney injury.  Evaluate for obstruction.  RENAL/URINARY TRACT ULTRASOUND COMPLETE  Comparison:  CT 05/23/2007  Findings:  Right Kidney:  Right kidney is mildly echogenic. It is normal in overall size.  It measures 12.7 cm in length. There is a hypo or anechoic focus arising from the cortical margin of the lower pole that is likely a cyst.  It measures 14 mm in size.  No convincing solid mass and no definite stone is seen.  Left Kidney:  Left kidney shows mild cortical thinning and increased renal parenchymal echogenicity. Small hypoechoic to anechoic lesion in the upper pole is nonspecific but likely a cyst. It measures 1 cm in greatest dimension.  No convincing solid renal mass and no stone.  No hydronephrosis.  Bladder:  Bladder is minimally distended.  Neither ureteral jet was visualized.  No bladder mass or stone.  IMPRESSION: Findings are consistent with medical renal disease with increased renal parenchymal echogenicity bilaterally and renal cortical thinning on the left.  There are probable bilateral renal cysts too small to fully characterize on ultrasound.  No hydronephrosis.   Original Report Authenticated By: Amie Portland, M.D.    Recent Labs  06/08/13 0530  WBC 6.3  HGB 9.1*  HCT 31.5*  PLT 240    Recent Labs  06/07/13 0521 06/08/13 0530  NA 139 141  K 3.7 4.0  CL 102 102  GLUCOSE 80 80  BUN 19 31*  CREATININE 4.83* 7.41*  CALCIUM 8.2* 8.5   CBG (last 3)   Recent Labs  06/08/13 1637 06/08/13 2120 06/09/13 0730   GLUCAP 77 95 87    Wt Readings from Last 3 Encounters:  06/09/13 98.2 kg (216 lb 7.9 oz)  06/03/13 99.3 kg (218 lb 14.7 oz)  06/03/13 99.3 kg (218 lb 14.7 oz)    Physical Exam:  Constitutional: He is oriented to person, place, and time. He appears well-developed and well-nourished.  Keeps eyes closed  HENT:  Head: Normocephalic and atraumatic.  Eyes: Conjunctivae are normal. Pupils are equal, round, and reactive to light.  Neck: Normal range of motion. Neck supple. No thyromegaly present.  Cardiovascular: Normal rate. An irregularly irregular rhythm present. No murmur  Pulmonary/Chest: Effort normal and breath sounds normal. No respiratory distress. He has no wheezes.  Abdominal: Soft. Bowel sounds are normal. He exhibits no distension. There is no tenderness.  Musculoskeletal: He exhibits no edema and no tenderness. Right greater troch tender to touch.  Stasis changes BLE. Incisions BLE clean, dry And intact.  Neurological: He is oriented to person, place, and time. Appropriate conversationally. Used calendar to recall day. Able to follow simple commands without difficulty. Motor strength is 4/5 in left hip flexor knee extensor ankle dorsiflexor and plantar flexor 3 minus right hip flexor, knee extensors, ankle dorsiflexor plantar flexor, 5/5 bilateral deltoid, bicep, tricep, grip. Decreased sensation right foot compared to left foot, but difference negligible   Assessment/Plan: 1. Functional deficits secondary to deconditioning after CABG/multiple medical issues which require 3+ hours per day of interdisciplinary therapy in a comprehensive inpatient  rehab setting. Physiatrist is providing close team supervision and 24 hour management of active medical problems listed below. Physiatrist and rehab team continue to assess barriers to discharge/monitor patient progress toward functional and medical goals.  Discussed LOS with patient and reassured him that we expect only 7-10  days FIM: FIM - Bathing Bathing Steps Patient Completed: Chest;Right Arm;Left Arm;Abdomen;Front perineal area;Buttocks;Right upper leg;Left upper leg;Right lower leg (including foot);Left lower leg (including foot) Bathing: 4: Steadying assist  FIM - Upper Body Dressing/Undressing Upper body dressing/undressing steps patient completed: Thread/unthread right sleeve of front closure shirt/dress;Button/unbutton shirt;Pull shirt around back of front closure shirt/dress;Thread/unthread left sleeve of front closure shirt/dress Upper body dressing/undressing: 5: Set-up assist to: Obtain clothing/put away FIM - Lower Body Dressing/Undressing Lower body dressing/undressing steps patient completed: Thread/unthread right underwear leg;Thread/unthread left underwear leg;Pull underwear up/down;Pull pants up/down;Fasten/unfasten pants;Don/Doff right shoe;Don/Doff left shoe;Thread/unthread right pants leg;Thread/unthread left pants leg Lower body dressing/undressing: 5: Set-up assist to: Obtain clothing  FIM - Toileting Toileting steps completed by patient: Adjust clothing prior to toileting;Performs perineal hygiene;Adjust clothing after toileting Toileting Assistive Devices: Grab bar or rail for support Toileting: 0: Activity did not occur  FIM - Diplomatic Services operational officer Devices: Therapist, music Transfers: 0-Activity did not occur  FIM - Banker Devices: Therapist, occupational: 4: Supine > Sit: Min A (steadying Pt. > 75%/lift 1 leg);4: Bed > Chair or W/C: Min A (steadying Pt. > 75%)  FIM - Locomotion: Wheelchair Locomotion: Wheelchair: 2: Travels 50 - 149 ft with minimal assistance (Pt.>75%) (using B/ L LE ) FIM - Locomotion: Ambulation Locomotion: Ambulation Assistive Devices: Walker - Rolling Ambulation/Gait Assistance: 5: Supervision Locomotion: Ambulation: 2: Travels 50 - 149 ft with supervision/safety  issues  Comprehension Comprehension Mode: Auditory Comprehension: 5-Understands basic 90% of the time/requires cueing < 10% of the time  Expression Expression Mode: Verbal Expression: 5-Expresses basic needs/ideas: With extra time/assistive device  Social Interaction Social Interaction Mode: Asleep Social Interaction: 5-Interacts appropriately 90% of the time - Needs monitoring or encouragement for participation or interaction.  Problem Solving Problem Solving Mode: Asleep Problem Solving: 5-Solves basic 90% of the time/requires cueing < 10% of the time  Memory Memory Mode: Asleep Memory: 3-Recognizes or recalls 50 - 74% of the time/requires cueing 25 - 49% of the time  Medical Problem List and Plan:  1. DVT Prophylaxis/Anticoagulation: Pharmaceutical: Coumadin  2. Pain Management: Will discontinue hydrocodone and use tylenol prn for pain.  3. Mood: Has anxiety regarding renal status. Family reports confusion/lethargy past HD sessions. Will monitor and offer ego support. LCSW to follow for evaluation.  4. Neuropsych: This patient is capable of making decisions on his own behalf.  5. Acute on Chronic renal failure: Continues with poor urine output. Check daily weights, strict I & O. Continue renal diet. HD--T,T,Sa.  6. Anemia of chronic disease: On nulecit IV for now.  7. Atrial Fibrillation: Monitor with bid checks. Amiodarone d/c and coreg increased today. Monitor for bradycardia.  8. Peripheral edema:  TEDs for use when OOB.  9. Constipation: PRN lax's.  10. Nausea:  Likely multifactorial related to renal/meds. Did move bowels yesterday  -diet liberalized. Trying to encourage PO 11. Orthostasis:  -Volume mgt per nephrology  -TEDS/Abd binder  -improved with therapy yesterday, SBP dropped this am    LOS (Days) 6 A FACE TO FACE EVALUATION WAS PERFORMED  SWARTZ,ZACHARY T 06/09/2013 9:02 AM

## 2013-06-09 NOTE — Progress Notes (Signed)
ANTICOAGULATION CONSULT NOTE - Follow Up Consult  Pharmacy Consult for Coumadin Indication: atrial fibrillation, history of DVT  Allergies  Allergen Reactions  . 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    Patient Measurements: Height: 6' 0.83" (185 cm) Weight: 216 lb 7.9 oz (98.2 kg) IBW/kg (Calculated) : 79.52 Heparin Dosing Weight:   Vital Signs: Temp: 97.9 F (36.6 C) (08/05 0553) Temp src: Oral (08/05 0553) BP: 127/74 mmHg (08/05 0556) Pulse Rate: 85 (08/05 0556)  Labs:  Recent Labs  06/07/13 0521 06/08/13 0530 06/09/13 0845  HGB  --  9.1*  --   HCT  --  31.5*  --   PLT  --  240  --   LABPROT  --  32.4* 32.1*  INR  --  3.31* 3.27*  CREATININE 4.83* 7.41*  --     Estimated Creatinine Clearance: 10.3 ml/min (by C-G formula based on Cr of 7.41).   Medications:  Scheduled:  . atorvastatin  20 mg Oral q1800  . calcium acetate  667 mg Oral TID WC  . darbepoetin (ARANESP) injection - NON-DIALYSIS  60 mcg Subcutaneous Q Tue-1800  . ferric gluconate (FERRLECIT/NULECIT) IV  125 mg Intravenous Q T,Th,Sa-HD  . heparin  4,600 Units Intravenous Once  . insulin aspart  0-5 Units Subcutaneous QHS  . insulin aspart  0-9 Units Subcutaneous TID WC  . levothyroxine  100 mcg Oral QAC breakfast  . multivitamin  1 tablet Oral Daily  . pantoprazole  40 mg Oral Daily  . senna-docusate  2 tablet Oral QHS  . sodium chloride  3 mL Intravenous Q12H  . Warfarin - Pharmacist Dosing Inpatient   Does not apply q1800    Assessment: 79yo male with AFib and history of DVT, on chronic Coumadin pta.  INR 3.27, essentially unchanged.  Dose held yesterday.  No bleeding problems noted.  Goal of Therapy:  INR 2-3 Monitor platelets by anticoagulation protocol: Yes   Plan:  1.  No Coumadin today 2.  Daily INR  Marisue Humble, PharmD Clinical Pharmacist [facility]

## 2013-06-10 ENCOUNTER — Inpatient Hospital Stay (HOSPITAL_COMMUNITY): Payer: Medicare Other

## 2013-06-10 ENCOUNTER — Inpatient Hospital Stay (HOSPITAL_COMMUNITY): Payer: Medicare Other | Admitting: Occupational Therapy

## 2013-06-10 ENCOUNTER — Inpatient Hospital Stay (HOSPITAL_COMMUNITY): Payer: Medicare Other | Admitting: Physical Therapy

## 2013-06-10 DIAGNOSIS — R5381 Other malaise: Secondary | ICD-10-CM

## 2013-06-10 DIAGNOSIS — I2510 Atherosclerotic heart disease of native coronary artery without angina pectoris: Secondary | ICD-10-CM

## 2013-06-10 LAB — GLUCOSE, CAPILLARY
Glucose-Capillary: 83 mg/dL (ref 70–99)
Glucose-Capillary: 82 mg/dL (ref 70–99)

## 2013-06-10 LAB — RENAL FUNCTION PANEL
Albumin: 2.1 g/dL — ABNORMAL LOW (ref 3.5–5.2)
CO2: 25 mEq/L (ref 19–32)
Chloride: 100 mEq/L (ref 96–112)
Creatinine, Ser: 6.05 mg/dL — ABNORMAL HIGH (ref 0.50–1.35)
GFR calc Af Amer: 9 mL/min — ABNORMAL LOW (ref >90)
GFR calc non Af Amer: 8 mL/min — ABNORMAL LOW (ref >90)
Potassium: 4.2 mEq/L (ref 3.5–5.1)

## 2013-06-10 LAB — PROTIME-INR
INR: 3.35 — ABNORMAL HIGH (ref 0.00–1.49)
Prothrombin Time: 32.7 seconds — ABNORMAL HIGH (ref 11.6–15.2)

## 2013-06-10 MED ORDER — CAMPHOR-MENTHOL 0.5-0.5 % EX LOTN
TOPICAL_LOTION | CUTANEOUS | Status: DC | PRN
  Administered 2013-06-10: 22:00:00 via TOPICAL
  Filled 2013-06-10: qty 222

## 2013-06-10 MED ORDER — PROMETHAZINE HCL 12.5 MG PO TABS
12.5000 mg | ORAL_TABLET | ORAL | Status: DC | PRN
  Administered 2013-06-10 – 2013-06-12 (×5): 12.5 mg via ORAL
  Filled 2013-06-10 (×5): qty 1

## 2013-06-10 NOTE — Progress Notes (Signed)
Occupational Therapy Session Note  Patient Details  Name: [name] MRN: [id number] Date of Birth: 02/10/1934  Today's Date: 06/10/2013 Time: 0800-0855 Time Calculation (min): 55 min  Short Term Goals: Week 1:  OT Short Term Goal 1 (Week 1): STG=LTG secondary to ELOS  Skilled Therapeutic Interventions/Progress Updates:    Pt in bed with family at bedside.  Pt c/o n/v but still wanted to get up and wash up and put on clean clothes.  Family left room during bathing and dressing.  Pt stated he had started getting sick during HD the previous night and it had not alleviated any.  Pt c/o increased nausea with repeated sit<>stand during bathing and dressing tasks.  Pt required assistance donning Ted hose but completed all other tasks at supervision level.  Focus on activity tolerance, safety awareness,dynamic standing balance, and transfers. Therapy Documentation Precautions:  Precautions Precautions: Sternal;Fall Precaution Comments:  (Pt able to recall 1/5 sternal precautions) Restrictions Weight Bearing Restrictions: Yes RUE Weight Bearing: Non weight bearing LUE Weight Bearing: Non weight bearing General:   Pain: Pain Assessment Pain Assessment: No/denies pain  See FIM for current functional status  Therapy/Group: Individual Therapy  Rich Brave 06/10/2013, 8:58 AM

## 2013-06-10 NOTE — Progress Notes (Signed)
Occupational Therapy Session Note  Patient Details  Name: [name] MRN: [id number] Date of Birth: August 11, 1934  Today's Date: 06/10/2013  Short Term Goals: Week 1:  OT Short Term Goal 1 (Week 1): STG=LTG secondary to ELOS  Skilled Therapeutic Interventions/Progress Updates:  Session Note: Time: 1300-1330 (30 mins) Pt reported 7/10 pain in R ribs. RN notified.  Upon entering room, pt supine in bed asleep. Pt->EOB and OT donned abdominal binder. Pt ambulated with R/W->w/c. OT pushed pt in w/c. Pt ambulated with R/W->ADL apt (~50 ft) focusing on fxal mobility and overall activity tolerance. Pt transferred<>tub bench (supervision). Pt agreed this will be the safest option once discharged home for energy conservation. During transfer pt c/o dizziness (dizziness=6/10; BP=117/71; HR=97). Pt ambulated with R/W->w/c. Pt ambulated with R/W->chair (~50 ft) and completed furniture transfer (min assist). OT pushed pt->room. At end of tx session, pt seated in w/c with call bell and phone within reach. RN notified of pt's dizziness.  Therapy Documentation Precautions:  Precautions Precautions: Sternal;Fall Precaution Comments:  (Pt able to recall 1/5 sternal precautions) Restrictions Weight Bearing Restrictions: Yes RUE Weight Bearing: Non weight bearing LUE Weight Bearing: Non weight bearing  See FIM for current functional status  Therapy/Group: Individual Therapy  [name] 06/10/2013, 8:04 AM

## 2013-06-10 NOTE — Progress Notes (Signed)
Progress note reviewed and agree with content. 

## 2013-06-10 NOTE — Progress Notes (Signed)
Subjective/Complaints: Nausea a little better. No new complaints. Overall feels stronger A 12 point review of systems has been performed and if not noted above is otherwise negative.   Objective: Vital Signs: Blood pressure 119/69, pulse 83, temperature 98.8 F (37.1 C), temperature source Oral, resp. rate 17, height 6' 0.84" (1.85 m), weight 91.7 kg (202 lb 2.6 oz), SpO2 94.00%. US Renal  06/08/2013   *RADIOLOGY REPORT*  Clinical Data: No acute kidney injury.  Evaluate for obstruction.  RENAL/URINARY TRACT ULTRASOUND COMPLETE  Comparison:  CT 05/23/2007  Findings:  Right Kidney:  Right kidney is mildly echogenic. It is normal in overall size.  It measures 12.7 cm in length. There is a hypo or anechoic focus arising from the cortical margin of the lower pole that is likely a cyst.  It measures 14 mm in size.  No convincing solid mass and no definite stone is seen.  Left Kidney:  Left kidney shows mild cortical thinning and increased renal parenchymal echogenicity. Small hypoechoic to anechoic lesion in the upper pole is nonspecific but likely a cyst. It measures 1 cm in greatest dimension.  No convincing solid renal mass and no stone.  No hydronephrosis.  Bladder:  Bladder is minimally distended.  Neither ureteral jet was visualized.  No bladder mass or stone.  IMPRESSION: Findings are consistent with medical renal disease with increased renal parenchymal echogenicity bilaterally and renal cortical thinning on the left.  There are probable bilateral renal cysts too small to fully characterize on ultrasound.  No hydronephrosis.   Original Report Authenticated By: Amie Portland, M.D.    Recent Labs  06/08/13 0530  WBC 6.3  HGB 9.1*  HCT 31.5*  PLT 240    Recent Labs  06/09/13 1437 06/10/13 0642  NA 135 137  K 4.9 4.2  CL 98 100  GLUCOSE 97 87  BUN 49* 23  CREATININE 9.90* 6.05*  CALCIUM 8.5 8.4   CBG (last 3)   Recent Labs  06/09/13 1125 06/09/13 2042 06/10/13 0743  GLUCAP 94 83  83    Wt Readings from Last 3 Encounters:  06/10/13 91.7 kg (202 lb 2.6 oz)  06/03/13 99.3 kg (218 lb 14.7 oz)  06/03/13 99.3 kg (218 lb 14.7 oz)    Physical Exam:  Constitutional: He is oriented to person, place, and time. He appears well-developed and well-nourished.  Keeps eyes closed  HENT:  Head: Normocephalic and atraumatic.  Eyes: Conjunctivae are normal. Pupils are equal, round, and reactive to light.  Neck: Normal range of motion. Neck supple. No thyromegaly present.  Cardiovascular: Normal rate. An irregularly irregular rhythm present. No murmur  Pulmonary/Chest: Effort normal and breath sounds normal. No respiratory distress. He has no wheezes.  Abdominal: Soft. Bowel sounds are normal. He exhibits no distension. There is no tenderness.  Musculoskeletal: He exhibits no edema and no tenderness. Right greater troch tender to touch.  Stasis changes BLE. Incisions BLE clean, dry And intact.  Neurological: He is oriented to person, place, and time. Appropriate conversationally. Used calendar to recall day. Able to follow simple commands without difficulty. Motor strength is 4/5 in left hip flexor knee extensor ankle dorsiflexor and plantar flexor 3 minus right hip flexor, knee extensors, ankle dorsiflexor plantar flexor, 5/5 bilateral deltoid, bicep, tricep, grip. Decreased sensation right foot compared to left foot, but difference negligible   Assessment/Plan: 1. Functional deficits secondary to deconditioning after CABG/multiple medical issues which require 3+ hours per day of interdisciplinary therapy in a comprehensive inpatient rehab setting.  Physiatrist is providing close team supervision and 24 hour management of active medical problems listed below. Physiatrist and rehab team continue to assess barriers to discharge/monitor patient progress toward functional and medical goals.  Discussed LOS with patient and reassured him that we expect only 7-10 days FIM: FIM -  Bathing Bathing Steps Patient Completed: Chest;Right Arm;Left Arm;Abdomen;Front perineal area;Buttocks;Right upper leg;Left upper leg;Right lower leg (including foot);Left lower leg (including foot) Bathing: 5: Supervision: Safety issues/verbal cues  FIM - Upper Body Dressing/Undressing Upper body dressing/undressing steps patient completed: Thread/unthread right sleeve of front closure shirt/dress;Button/unbutton shirt;Pull shirt around back of front closure shirt/dress;Thread/unthread left sleeve of front closure shirt/dress Upper body dressing/undressing: 5: Set-up assist to: Obtain clothing/put away FIM - Lower Body Dressing/Undressing Lower body dressing/undressing steps patient completed: Thread/unthread right underwear leg;Thread/unthread left underwear leg;Pull underwear up/down;Pull pants up/down;Fasten/unfasten pants;Don/Doff right shoe;Don/Doff left shoe;Thread/unthread right pants leg;Thread/unthread left pants leg Lower body dressing/undressing: 5: Set-up assist to: Don/Doff TED stocking  FIM - Toileting Toileting steps completed by patient: Adjust clothing prior to toileting;Performs perineal hygiene;Adjust clothing after toileting Toileting Assistive Devices: Grab bar or rail for support Toileting: 0: Activity did not occur  FIM - Diplomatic Services operational officer Devices: Grab bars Toilet Transfers: 0-Activity did not occur  FIM - Banker Devices: Therapist, occupational: 5: Supine > Sit: Supervision (verbal cues/safety issues);5: Bed > Chair or W/C: Supervision (verbal cues/safety issues)  FIM - Locomotion: Wheelchair Locomotion: Wheelchair: 1: Travels less than 50 ft with minimal assistance (Pt.>75%) FIM - Locomotion: Ambulation Locomotion: Ambulation Assistive Devices: Designer, industrial/product Ambulation/Gait Assistance: 4: Min assist Locomotion: Ambulation: 2: Travels 50 - 149 ft with minimal assistance  (Pt.>75%)  Comprehension Comprehension Mode: Auditory Comprehension: 4-Understands basic 75 - 89% of the time/requires cueing 10 - 24% of the time  Expression Expression Mode: Verbal Expression: 5-Expresses basic needs/ideas: With extra time/assistive device  Social Interaction Social Interaction Mode: Asleep Social Interaction: 4-Interacts appropriately 75 - 89% of the time - Needs redirection for appropriate language or to initiate interaction.  Problem Solving Problem Solving Mode: Asleep Problem Solving: 4-Solves basic 75 - 89% of the time/requires cueing 10 - 24% of the time  Memory Memory Mode: Asleep Memory: 3-Recognizes or recalls 50 - 74% of the time/requires cueing 25 - 49% of the time  Medical Problem List and Plan:  1. DVT Prophylaxis/Anticoagulation: Pharmaceutical: Coumadin  2. Pain Management: Will discontinue hydrocodone and use tylenol prn for pain.  3. Mood: Has anxiety regarding renal status. Family reports confusion/lethargy past HD sessions. Will monitor and offer ego support. LCSW to follow for evaluation.  4. Neuropsych: This patient is capable of making decisions on his own behalf.  5. Acute on Chronic renal failure: Continues with poor urine output. Check daily weights, strict I & O. Continue renal diet. HD--T,T,Sa.   -discussed with renal MD regarding follow up HD. Apparently, since he's not been declared "endstage" he's not eligible for outpt HD. May have to move back to acute to complete HD 6. Anemia of chronic disease: On nulecit IV for now.  7. Atrial Fibrillation: Monitor with bid checks. Amiodarone d/c'ed and coreg increased. HR controlled to borderline  8. Peripheral edema:  TEDs for use when OOB.  9. Constipation: PRN lax's.  10. Nausea:  Likely multifactorial related to renal/meds. Is moving bowels  -diet liberalized. Trying to encourage PO 11. Orthostasis:  -Volume mgt per nephrology  -TEDS/Abd binder  -improved as a whole. Still drops at  times  LOS (Days) 7 A FACE TO FACE EVALUATION WAS PERFORMED  Deloyd Handy T 06/10/2013 9:12 AM

## 2013-06-10 NOTE — Progress Notes (Signed)
50M with anuric dialysis dependent AKI since 7/17 in setting of ACS/cardiogenic shock, contrat exposure  Subjective:  HD yesterday, UF 2.1L; some nausea during treatment not imp with ondansetron so HD was stopped early No UOP Cont with some nauesea today   08/05 0701 - 08/06 0700 In: 60 [P.O.:60] Out: 2110   Current meds: reviewed including Calcitriol, aranesp, nulecit,  Current Labs: reviewed   Physical Exam:  Blood pressure 119/69, pulse 83, temperature 98.8 F (37.1 C), temperature source Oral, resp. rate 17, height 6' 0.84" (1.85 m), weight 91.7 kg (202 lb 2.6 oz), SpO2 94.00%. NAD RRR B/l wet crackles. Nl wob.   ABD s/nt/nd EXT: no LEE AAO x3.  nonfocal  Assessment/Plan 1. Anuric AKI in setting of CKD on HD since 07/17:  No evidence yet of renal recovery. NO evidence of obstruction.  Cont on THS schedule.   2. CKD BMD: cont PhosLo. Acceptable Ca/Phos  3. Anemia: cont aranesp, nulecit  4. CKD: will watch and hope for some recovery of renal function though with 3wk of injury and persistent anuria, the likelihood of recovery is diminishing and pt more likely to be ESRD.    Sabra Heck MD 06/10/2013, 12:00 PM   Recent Labs Lab 06/08/13 0530 06/09/13 1437 06/10/13 0642  NA 141 135 137  K 4.0 4.9 4.2  CL 102 98 100  CO2 30 23 25   GLUCOSE 80 97 87  BUN 31* 49* 23  CREATININE 7.41* 9.90* 6.05*  CALCIUM 8.5 8.5 8.4  PHOS 3.9 3.5 3.2    Recent Labs Lab 06/04/13 1743 06/06/13 0500 06/08/13 0530  WBC 7.9 6.9 6.3  HGB 8.3* 8.8* 9.1*  HCT 28.2* 30.4* 31.5*  MCV 87.0 89.7 90.0  PLT 287 230 240    Current Facility-Administered Medications  Medication Dose Route Frequency Provider Last Rate Last Dose  . acetaminophen (TYLENOL) tablet 325-650 mg  325-650 mg Oral Q4H PRN Jacquelynn Cree, PA-C   650 mg at 06/03/13 2033  . albuterol (PROVENTIL) (5 MG/ML) 0.5% nebulizer solution 2.5 mg  2.5 mg Nebulization Q6H PRN Jacquelynn Cree, PA-C      . atorvastatin (LIPITOR) tablet  20 mg  20 mg Oral q1800 Evlyn Kanner Love, PA-C   20 mg at 06/10/13 0034  . bisacodyl (DULCOLAX) suppository 10 mg  10 mg Rectal Daily PRN Jacquelynn Cree, PA-C      . calcium acetate (PHOSLO) capsule 667 mg  667 mg Oral TID WC Evlyn Kanner Love, PA-C   667 mg at 06/10/13 0920  . camphor-menthol (SARNA) lotion   Topical PRN Jacquelynn Cree, PA-C      . darbepoetin [facility]) injection 60 mcg  60 mcg Subcutaneous Q Tue-1800 Jacquelynn Cree, PA-C   60 mcg at 06/10/13 0035  . diphenhydrAMINE (BENADRYL) 12.5 MG/5ML elixir 12.5-25 mg  12.5-25 mg Oral Q6H PRN Jacquelynn Cree, PA-C      . feeding supplement (RESOURCE BREEZE) liquid 1 Container  1 Container Oral TID BM PRN Haynes Bast, RD      . ferric gluconate (NULECIT) 125 mg in sodium chloride 0.9 % 100 mL IVPB  125 mg Intravenous Q T,Th,Sa-HD Evlyn Kanner Love, PA-C   125 mg at 06/09/13 1757  . guaiFENesin-dextromethorphan (ROBITUSSIN DM) 100-10 MG/5ML syrup 15 mL  15 mL Oral Q4H PRN Jacquelynn Cree, PA-C      . heparin injection 4,600 Units  4,600 Units Intravenous Once Sadie Haber, MD      .  HYDROcodone-acetaminophen (NORCO/VICODIN) 5-325 MG per tablet 1-2 tablet  1-2 tablet Oral Q4H PRN Jacquelynn Cree, PA-C   2 tablet at 06/09/13 1439  . insulin aspart (novoLOG) injection 0-5 Units  0-5 Units Subcutaneous QHS Pamela S Love, PA-C      . insulin aspart (novoLOG) injection 0-9 Units  0-9 Units Subcutaneous TID WC Evlyn Kanner Love, PA-C      . levothyroxine (SYNTHROID, LEVOTHROID) tablet 100 mcg  100 mcg Oral QAC breakfast Jacquelynn Cree, PA-C   100 mcg at 06/10/13 6045  . multivitamin (RENA-VIT) tablet 1 tablet  1 tablet Oral Daily Jacquelynn Cree, PA-C   1 tablet at 06/10/13 0920  . pantoprazole (PROTONIX) EC tablet 40 mg  40 mg Oral Daily Jacquelynn Cree, PA-C   40 mg at 06/10/13 0919  . promethazine (PHENERGAN) tablet 12.5 mg  12.5 mg Oral Q4H PRN Jacquelynn Cree, PA-C      . senna-docusate (Senokot-S) tablet 2 tablet  2 tablet Oral QHS Jacquelynn Cree, PA-C   2 tablet at  06/08/13 2102  . sodium chloride 0.9 % injection 3 mL  3 mL Intravenous Q12H Pamela S Love, PA-C      . sodium chloride 0.9 % injection 3 mL  3 mL Intravenous PRN Evlyn Kanner Love, PA-C      . sorbitol 70 % solution 30 mL  30 mL Oral Daily PRN Tresa Garter, MD   30 mL at 06/09/13 2142  . traZODone (DESYREL) tablet 25-50 mg  25-50 mg Oral QHS PRN Jacquelynn Cree, PA-C      . Warfarin - Pharmacist Dosing Inpatient   Does not apply q1800 Riki Rusk, [facility]

## 2013-06-10 NOTE — Progress Notes (Signed)
ANTICOAGULATION CONSULT NOTE - Follow Up Consult  Pharmacy Consult for Coumadin Indication: atrial fibrillation, history of DVT  Allergies  Allergen Reactions  . 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    Patient Measurements: Height: 6' 0.83" (185 cm) Weight: 202 lb 2.6 oz (91.7 kg) IBW/kg (Calculated) : 79.52 Heparin Dosing Weight:   Vital Signs: Temp: 98.8 F (37.1 C) (08/06 0602) Temp src: Oral (08/06 0602) BP: 119/69 mmHg (08/06 0602) Pulse Rate: 83 (08/06 0602)  Labs:  Recent Labs  06/08/13 0530 06/09/13 0845 06/09/13 1437 06/10/13 0642  HGB 9.1*  --   --   --   HCT 31.5*  --   --   --   PLT 240  --   --   --   LABPROT 32.4* 32.1*  --  32.7*  INR 3.31* 3.27*  --  3.35*  CREATININE 7.41*  --  9.90* 6.05*    Estimated Creatinine Clearance: 11.5 ml/min (by C-G formula based on Cr of 6.05).   Medications:  Scheduled:  . atorvastatin  20 mg Oral q1800  . calcium acetate  667 mg Oral TID WC  . darbepoetin (ARANESP) injection - NON-DIALYSIS  60 mcg Subcutaneous Q Tue-1800  . ferric gluconate (FERRLECIT/NULECIT) IV  125 mg Intravenous Q T,Th,Sa-HD  . heparin  4,600 Units Intravenous Once  . insulin aspart  0-5 Units Subcutaneous QHS  . insulin aspart  0-9 Units Subcutaneous TID WC  . levothyroxine  100 mcg Oral QAC breakfast  . multivitamin  1 tablet Oral Daily  . pantoprazole  40 mg Oral Daily  . senna-docusate  2 tablet Oral QHS  . sodium chloride  3 mL Intravenous Q12H  . Warfarin - Pharmacist Dosing Inpatient   Does not apply q1800    Assessment: 79yo male with AFib and history of DVT, on chronic Coumadin pta.  INR 3.35 today,  up from 3.27 yesterday.  Dose held 8/4 and 06/09/13.  Previous doses 7/30 through 8/3 were 2.5mg  daily. INR on 8/1 was 2.2.  INR increased to 3.31 (above goal) on 8/4.  No bleeding problems noted. Anemia of chronic disease-continues on IV nulecit and SQ aranesp.    Goal of Therapy:  INR 2-3 Monitor platelets by anticoagulation  protocol: Yes   Plan:  1.  No Coumadin today 2.  Daily INR  Noah Delaine, RPh Clinical Pharmacist Pager: [phone] 06/10/2013 13:22

## 2013-06-10 NOTE — Progress Notes (Signed)
Speech Language Pathology Daily Session Note  Patient Details  Name: [name] MRN: [id number] Date of Birth: 07/28/1934  Today's Date: 06/10/2013 Time: 0900-0915 Time Calculation (min): 15 min  Short Term Goals: Week 1: SLP Short Term Goal 1 (Week 1): Pt will use external aids to demonstrate orientation to person, time, location, and situation with Mod I. SLP Short Term Goal 2 (Week 1): Pt will demonstrate basic problem solving during functional task with Min cues. SLP Short Term Goal 3 (Week 1): Pt will sustain attention to functional task for 10 min with Min cues. SLP Short Term Goal 4 (Week 1): Pt will demonstrate adequate medication management using external aids with Min cues. SLP Short Term Goal 5 (Week 1): Pt will consume regular textures with thin liquids with no s/s of aspiration.  Skilled Therapeutic Interventions: Skilled treatment focused on cognitive goals. SLP facilitated session with Min verbal cues for recall of new information, which pt reported with accuracy. Education was provided to pt, wife, and son (young grandchildren also present) regarding medication management. Pt reviewed his medication list with Min visual cue to use the external aid. Pt politely declined further treatment as he was feeling sick. Continue plan of care.    FIM:  Comprehension Comprehension Mode: Auditory Comprehension: 4-Understands basic 75 - 89% of the time/requires cueing 10 - 24% of the time Expression Expression Mode: Verbal Expression: 5-Expresses basic needs/ideas: With extra time/assistive device Social Interaction Social Interaction: 5-Interacts appropriately 90% of the time - Needs monitoring or encouragement for participation or interaction. Problem Solving Problem Solving: 4-Solves basic 75 - 89% of the time/requires cueing 10 - 24% of the time Memory Memory: 3-Recognizes or recalls 50 - 74% of the time/requires cueing 25 - 49% of the time  Pain Pain Assessment Pain  Assessment: No/denies pain Pain Score: 0-No pain Patients Stated Pain Goal: 3 Multiple Pain Sites: No  Therapy/Group: Individual Therapy  Maxcine Ham 06/10/2013, 11:39 AM  Maxcine Ham, M.A. CCC-SLP

## 2013-06-10 NOTE — Progress Notes (Signed)
Nursing Note: Pt returned from HD. T-97.6 P-99 R-17 BP-146/73 PO2 98% on r/a. No complaints at this time.wbb

## 2013-06-10 NOTE — Progress Notes (Signed)
Note reviewed and accurately reflects treatment session.   

## 2013-06-10 NOTE — Patient Care Conference (Signed)
Inpatient RehabilitationTeam Conference and Plan of Care Update Date: 06/09/2013   Time: 2:20 PM    Patient Name: [name]      Medical Record Number: [id number]  Date of Birth: 11/26/1933 Sex: Male         Room/Bed: 4W10C/4W10C-01 Payor Info: Payor: MEDICARE / Plan: MEDICARE PART A AND B / Product Type: *No Product type* /    Admitting Diagnosis: Deconditioned CABG  Admit Date/Time:  06/03/2013  1:33 PM Admission Comments: No comment available   Primary Diagnosis:  Physical deconditioning Principal Problem: Physical deconditioning  Patient Active Problem List   Diagnosis Date Noted  . Physical deconditioning 06/04/2013  . Acute on chronic renal failure 06/04/2013  . S/P CABG x 4 06/02/2013  . Acute renal failure 06/02/2013  . Acute respiratory failure 05/13/2013  . Acute on chronic combined systolic and diastolic CHF, NYHA class 4 05/13/2013  . HCAP (healthcare-associated pneumonia) 05/13/2013  . NSTEMI (non-ST elevated myocardial infarction) 05/13/2013    Expected Discharge Date: Expected Discharge Date: 06/12/13  Team Members Present: Physician leading conference: Dr. Faith Rogue Social Worker Present: Amada Jupiter, LCSW Nurse Present: Keturah Barre, RN PT Present: Karolee Stamps, Jerrye Bushy, PT OT Present: Edwin Cap, Loistine Chance, OT SLP Present: Feliberto Gottron, SLP     Current Status/Progress Goal Weekly Team Focus  Medical   deconditioned after cabg, encephalopathy, volume issues/renal failure  maximize medically   pain, nausea, orthostasis   Bowel/Bladder   cont of bowel LBM 06/08/13/ patient is anuric  remain cont of bowel  remain cont of bowel   Swallow/Nutrition/ Hydration   Dys. 3 with thins (baseline)  N/A  N/A   ADL's   set-up grooming, steady assist bathing, set-up UB/LB dressing  overall supervision goals  BADL's, IADL's, fxal mobility, fxal transfers, adherence to sternal precautions   Mobility   min A - close S for stand pivot  transfers, min A-close S for supine<>sit, close S for short distance gait with RW  Mod I for short distance gait and stand pivot transfers; S for long distance gait, car transfer and bed mobility    gait training, activity tolerance/edurance, bed mobility, adherence to sternal precautions, functional transfers    Communication   comprehends basic information with Mod-Max cues; expresses wants/needs  comprehend basic information with supervision  comprehending basic information Min-Mod cues   Safety/Cognition/ Behavioral Observations  Mod cues for use of external aids for recall/orientation  use of external memory aids with supervision  medication management   Pain   denied pain at this time. vicodin 1-2 tablet PRN, Acetaminophen 325-650 mg PRn  less or equal to 3  free of pain   Skin   stge II pressure ulcer to sacrum  q2h turn  free of new skin breakdown    Rehab Goals Patient on target to meet rehab goals: Yes *See Care Plan and progress notes for long and short-term goals.  Barriers to Discharge: need for HD    Possible Resolutions to Barriers:  transfer to acute    Discharge Planning/Teaching Needs:  eventually home with wife and son who can provide 24/7 supervision, however, renal status may cause need for transfer back to acute prior to d/c home.      Team Discussion:  Anticipate meeting supervision goals.  Will some nausea today.  Cognition/ memory continue to be issues.  ST feels pt at baseline for swallowing and recs remaining on D3 at home.  Primary concern with rehab d/c is that pt  needs to remain in hospital until can diagnose as ESRD and can begin HD at outpatient HD center (insurance coverage issue). MD to speak with renal service to determine best route - may need to transfer back to acute.  Revisions to Treatment Plan:  None   Continued Need for Acute Rehabilitation Level of Care: The patient requires daily medical management by a physician with specialized training in  physical medicine and rehabilitation for the following conditions: Daily direction of a multidisciplinary physical rehabilitation program to ensure safe treatment while eliciting the highest outcome that is of practical value to the patient.: Yes Daily medical management of patient stability for increased activity during participation in an intensive rehabilitation regime.: Yes Daily analysis of laboratory values and/or radiology reports with any subsequent need for medication adjustment of medical intervention for : Post surgical problems;Neurological problems;Cardiac problems (nephrology)  [name] 06/10/2013, 9:21 AM

## 2013-06-10 NOTE — Progress Notes (Signed)
Physical Therapy Session Note  Patient Details  Name: [name] MRN: [id number] Date of Birth: September 09, 1934  Today's Date: 06/10/2013 Time: 4098-1191 Time Calculation (min): 27 min  Short Term Goals: Week 1:  PT Short Term Goal 1 (Week 1): Pt will be able to perform supin>sit with min A while maintaining sternal precautions PT Short Term Goal 2 (Week 1): Pt will be able to perform 75 ft gait in controlled environment with S using RW  PT Short Term Goal 3 (Week 1): Pt will be able to perform functional transfers with S while maintaining sternal precautions   Skilled Therapeutic Interventions/Progress Updates:    Therapy session focused on therapeutic exercise, bed mobility, and gait training. Pt performed supine>sit with min A and VC's for UE placement and log roll technique. Pt completed 5 x sit<>stand with min A-Close S and VC's to adhere to sternal precautions to work of functional strengthening for B/L LE to improve transfers. Pt completed seated therapeutic exercises that consisted of B/L LE marching, long arc quads, and ankle pumps (1 set x 10 reps) for B/L LE strengthening to improve functional mobility and gait. Pt performed gait for 30 ft with RW with close S and VC's for forward gaze. VOR x 1 exercises initiated (side to side) for 30 secs x 2 reps in the unsupported seated position to enhance gaze stabilization to reduce dizziness symptoms. Pt tolerated exercise well without increased reports of symptoms. Overall, pt c/o of fatigue and requires copious amounts of encouragement to participate in therapy today as he states he "is sick and just can't do it". PT encouraged pt to stay up in w/c for 10-15 mins as pt requested to return to bed at end of session. No c/o pain during therapy session and pt was able to recall 4/5 sternal precautions. Nursing staff notified to encourage pt to stay up OOB in w/c to continue to build activity tolerance and endurance. Son expressed concern that father may  be "giving up" and just saying he is "sick" to not have to participate in therapy.     Therapy Documentation Precautions:  Precautions Precautions: Sternal;Fall Precaution Comments:  (Pt able to recall 1/5 sternal precautions) Restrictions Weight Bearing Restrictions: Yes RUE Weight Bearing: Non weight bearing LUE Weight Bearing: Non weight bearing  See FIM for current functional status  Therapy/Group: Individual Therapy  [name], Adalid Beckmann 06/10/2013, 12:23 PM

## 2013-06-10 NOTE — Progress Notes (Signed)
Physical Therapy Note  Patient Details  Name: ORLEY LAWRY MRN: [id number] Date of Birth: October 28, 1934 Today's Date: 06/10/2013  1400-1445 (45 minutes) individual (missed 15 minutes /nausea) Pain: no reported pain/ 8/10 nausea Other: BP 103/65 in supine Focus of treatment: Pt in bed upon arrival; supine to sit SBA with difficulty; sit to stand with hands on knees SBA; transfer RW SBA; Nustep level 4 X 10 minutes LEs only; standing alternate hip flexion to 12 inch step with rail support  x 4  With increased c/o dizziness. Unable to obtain standing BP secondary to dizziness. Returned to room /bed with bed alarm activated.   Vineta Carone,JIM 06/10/2013, 2:13 PM

## 2013-06-11 ENCOUNTER — Inpatient Hospital Stay (HOSPITAL_COMMUNITY): Payer: Medicare Other

## 2013-06-11 ENCOUNTER — Inpatient Hospital Stay (HOSPITAL_COMMUNITY): Payer: Medicare Other | Admitting: Occupational Therapy

## 2013-06-11 LAB — RENAL FUNCTION PANEL
Albumin: 2.2 g/dL — ABNORMAL LOW (ref 3.5–5.2)
BUN: 34 mg/dL — ABNORMAL HIGH (ref 6–23)
Calcium: 8.4 mg/dL (ref 8.4–10.5)
GFR calc Af Amer: 6 mL/min — ABNORMAL LOW (ref >90)
Glucose, Bld: 79 mg/dL (ref 70–99)
Phosphorus: 3 mg/dL (ref 2.3–4.6)
Sodium: 138 mEq/L (ref 135–145)

## 2013-06-11 LAB — GLUCOSE, CAPILLARY
Glucose-Capillary: 82 mg/dL (ref 70–99)
Glucose-Capillary: 79 mg/dL (ref 70–99)

## 2013-06-11 MED ORDER — WARFARIN SODIUM 1 MG PO TABS
1.0000 mg | ORAL_TABLET | Freq: Once | ORAL | Status: AC
  Administered 2013-06-11: 1 mg via ORAL
  Filled 2013-06-11: qty 1

## 2013-06-11 MED ORDER — METOPROLOL TARTRATE 25 MG PO TABS
25.0000 mg | ORAL_TABLET | Freq: Once | ORAL | Status: AC
  Administered 2013-06-11: 25 mg via ORAL
  Filled 2013-06-11: qty 1

## 2013-06-11 MED ORDER — WHITE PETROLATUM GEL
Status: AC
  Administered 2013-06-11: 11:00:00
  Filled 2013-06-11: qty 5

## 2013-06-11 NOTE — Progress Notes (Signed)
Social Work Patient ID: [name], male   DOB: 11-22-1933, 79 y.o.   MRN: [id number]   Continue to pursue d/c alternatives for [name].  Per MD request, contacted admissions reviewer for [facility] who came to evaluate [name] today, however, have not received any answer yet as to their ability to admit.  Dr. Riley Kill has spoken with [facility] Hospitalist who had asked that we consult [facility] first and, if declined for admit, that they could consider a transfer to their service on another unit.   Attempted to talk with [name] about this but he just becomes increasingly frustrated with this "unknown" period and simply states that he wants to go home.   "these things (kidneys) ain't gonna start working and they know it... Everybody tells me something different."  [name] not interested in my attempt to explain issues regarding ESRD diagnosis and OP HD centers.  We agreed that it is best for [place] to not speak of anything until I can give him a definite plan.    Will keep staff posted.  Korey Prashad, LCSW

## 2013-06-11 NOTE — Progress Notes (Signed)
73M with anuric dialysis dependent AKI since 7/17 in setting of ACS/cardiogenic shock, contrat exposure   Subjective:   No UOP Pt updated about circumstances surrounding AKI, need for dialysis need, and potential for recovery.    08/06 0701 - 08/07 0700 In: 145 [P.O.:145] Out: -   Current meds: reviewed  Current Labs: reviewed    Physical Exam:  Blood pressure 92/49, pulse 95, temperature 98.3 F (36.8 C), temperature source Oral, resp. rate 20, height 6' 0.84" (1.85 m), weight 97.9 kg (215 lb 13.3 oz), SpO2 100.00%. NAD, sitting in chair RRR, nl s1s2, no rub CTAB. Nl wob ABD s/nt/nd nabs Aaox3.  nonfocal No LEE  Assessment/Plan 1. Anuric AKI in setting of CKD on HD since 07/17: No evidence yet of renal recovery. NO evidence of obstruction. Cont on THS schedule.  HD Today 2. CKD BMD: cont PhosLo. Acceptable Ca/Phos  3. Anemia: cont aranesp, nulecit  4. CKD: Pts age, pre-existign CKD, and prolonged anuria make likelihood of renal recovery low, however has only been on HD for 3wk as of today.  Too early to declare ESRD.  [facility] is working towards arangning for hospital bed following completion of their training.    Sabra Heck MD 06/11/2013, 9:39 AM   Recent Labs Lab 06/09/13 1437 06/10/13 0642 06/11/13 0605  NA 135 137 138  K 4.9 4.2 4.1  CL 98 100 99  CO2 23 25 28   GLUCOSE 97 87 79  BUN 49* 23 34*  CREATININE 9.90* 6.05* 8.37*  CALCIUM 8.5 8.4 8.4  PHOS 3.5 3.2 3.0    Recent Labs Lab 06/04/13 1743 06/06/13 0500 06/08/13 0530  WBC 7.9 6.9 6.3  HGB 8.3* 8.8* 9.1*  HCT 28.2* 30.4* 31.5*  MCV 87.0 89.7 90.0  PLT 287 230 240    Current Facility-Administered Medications  Medication Dose Route Frequency Provider Last Rate Last Dose  . acetaminophen (TYLENOL) tablet 325-650 mg  325-650 mg Oral Q4H PRN Jacquelynn Cree, PA-C   650 mg at 06/03/13 2033  . albuterol (PROVENTIL) (5 MG/ML) 0.5% nebulizer solution 2.5 mg  2.5 mg Nebulization Q6H PRN Jacquelynn Cree, PA-C       . atorvastatin (LIPITOR) tablet 20 mg  20 mg Oral q1800 Evlyn Kanner Love, PA-C   20 mg at 06/10/13 1759  . bisacodyl (DULCOLAX) suppository 10 mg  10 mg Rectal Daily PRN Jacquelynn Cree, PA-C      . calcium acetate (PHOSLO) capsule 667 mg  667 mg Oral TID WC Evlyn Kanner Love, PA-C   667 mg at 06/10/13 0920  . camphor-menthol (SARNA) lotion   Topical PRN Jacquelynn Cree, PA-C      . darbepoetin [facility]) injection 60 mcg  60 mcg Subcutaneous Q Tue-1800 Jacquelynn Cree, PA-C   60 mcg at 06/10/13 0035  . diphenhydrAMINE (BENADRYL) 12.5 MG/5ML elixir 12.5-25 mg  12.5-25 mg Oral Q6H PRN Jacquelynn Cree, PA-C      . feeding supplement (RESOURCE BREEZE) liquid 1 Container  1 Container Oral TID BM PRN Haynes Bast, RD      . ferric gluconate (NULECIT) 125 mg in sodium chloride 0.9 % 100 mL IVPB  125 mg Intravenous Q T,Th,Sa-HD Evlyn Kanner Love, PA-C   125 mg at 06/09/13 1757  . guaiFENesin-dextromethorphan (ROBITUSSIN DM) 100-10 MG/5ML syrup 15 mL  15 mL Oral Q4H PRN Jacquelynn Cree, PA-C      . heparin injection 4,600 Units  4,600 Units Intravenous Once Sadie Haber, MD      .  HYDROcodone-acetaminophen (NORCO/VICODIN) 5-325 MG per tablet 1-2 tablet  1-2 tablet Oral Q4H PRN Jacquelynn Cree, PA-C   2 tablet at 06/09/13 1439  . insulin aspart (novoLOG) injection 0-5 Units  0-5 Units Subcutaneous QHS Pamela S Love, PA-C      . insulin aspart (novoLOG) injection 0-9 Units  0-9 Units Subcutaneous TID WC Evlyn Kanner Love, PA-C      . levothyroxine (SYNTHROID, LEVOTHROID) tablet 100 mcg  100 mcg Oral QAC breakfast Jacquelynn Cree, PA-C   100 mcg at 06/11/13 [phone]  . multivitamin (RENA-VIT) tablet 1 tablet  1 tablet Oral Daily Jacquelynn Cree, PA-C   1 tablet at 06/10/13 0920  . pantoprazole (PROTONIX) EC tablet 40 mg  40 mg Oral Daily Jacquelynn Cree, PA-C   40 mg at 06/10/13 0919  . promethazine (PHENERGAN) tablet 12.5 mg  12.5 mg Oral Q4H PRN Jacquelynn Cree, PA-C   12.5 mg at 06/10/13 1300  . senna-docusate (Senokot-S) tablet 2  tablet  2 tablet Oral QHS Jacquelynn Cree, PA-C   2 tablet at 06/10/13 2145  . sodium chloride 0.9 % injection 3 mL  3 mL Intravenous Q12H Pamela S Love, PA-C      . sodium chloride 0.9 % injection 3 mL  3 mL Intravenous PRN Evlyn Kanner Love, PA-C      . sorbitol 70 % solution 30 mL  30 mL Oral Daily PRN Tresa Garter, MD   30 mL at 06/09/13 2142  . traZODone (DESYREL) tablet 25-50 mg  25-50 mg Oral QHS PRN Jacquelynn Cree, PA-C      . warfarin (COUMADIN) tablet 1 mg  1 mg Oral ONCE-1800 Arman Filter, [facility]      . Warfarin - Pharmacist Dosing Inpatient   Does not apply q1800 Riki Rusk, RPH      . white petrolatum (VASELINE) gel

## 2013-06-11 NOTE — Progress Notes (Signed)
Physical Therapy Note  Patient Details  Name: [name] MRN: [id number] Date of Birth: [birth year] Today's Date: 06/11/2013  8:00 - 8:27 27 minutes  Individual session Patient denies pain. Patient resting in bed upon entering room. Bp supine 130/71. Patient sat EOB to eat breakfast. Patient ate about 3 bites and reported feeling "lightheaded" and needing to lie down. Patient Bp sitting 116/72. Patient reported he did not want any more breakfast. Patient performed LE active exercises x 10 - 12 reps each of heel slides, ankle pumps, hip abduction, towel squeezes, and SAQ's. Bp following exercise 128/66. Patient repositioned in bed and left with bed alarm on and all items in reach.   Arelia Longest M 06/11/2013, 10:11 AM

## 2013-06-11 NOTE — Progress Notes (Signed)
Physical Therapy Weekly Progress Note  Patient Details  Name: JELANI TRUEBA MRN: [id number] Date of Birth: [birth year]  Today's Date: 06/11/2013  Patient has met 3 of 3 short term goals.  Overall, pt is progressing well with therapy however participation in therapy varies on a daily basis depending on pt's c/o dizziness and nausea. Currently pt is able to perform bed mobility (sit<>supine) with S, perform gait for 100 ft with S in a controled environment using a RW, negotiate 10 stairs using B/L hand rails with S, and perform bed<>chair transfers with close S. Pt continues to require work on cardiovascular endurance, gait training, bed mobility following sternal precautions, and dynamic standing balance. Overall, pt's main barrier to therapy is his reports of dizziness/nausea, desire to lay in the bed consistently during the day, and decreased cardiovascular endurance. Pt has been continuously encouraged by interdisciplinary team to stay up in W/C to improve activity tolerance and endurance however pt continues to request to return to bed at end of therapy sessions. Pt has been evaluated by vestibular physical therapist specialist and VOR x 1 gaze stabilization exercises have been initiated within the last two day.  Pt's therapy frequency has been decreased to QD secondary to pending D/C plan.    Patient continues to demonstrate the following deficits: decreased cardiovascular endurance, gait, dynamic balance, strengthening, ability to recall and adhere to sternal precautions, functional transfers and therefore will continue to benefit from skilled PT intervention to enhance overall performance with activity tolerance, balance, ability to compensate for deficits and knowledge of precautions.  Patient progressing toward long term goals..  Continue plan of care.  PT Short Term Goals Week 1:  PT Short Term Goal 1 (Week 1): Pt will be able to perform supin>sit with min A while maintaining sternal  precautions PT Short Term Goal 1 - Progress (Week 1): Met PT Short Term Goal 2 (Week 1): Pt will be able to perform 75 ft gait in controlled environment with S using RW  PT Short Term Goal 2 - Progress (Week 1): Met PT Short Term Goal 3 (Week 1): Pt will be able to perform functional transfers with S while maintaining sternal precautions  PT Short Term Goal 3 - Progress (Week 1): Met Week 2:  PT Short Term Goal 1 (Week 2): =LTG  Skilled Therapeutic Interventions/Progress Updates:  Ambulation/gait training;Balance/vestibular training;Cognitive remediation/compensation;Community reintegration;Discharge planning;Disease management/prevention;DME/adaptive equipment instruction;Functional mobility training;Neuromuscular re-education;Pain management;Patient/family education;Psychosocial support;Skin care/wound management;Stair training;Therapeutic Activities;Therapeutic Exercise;UE/LE Strength taining/ROM;UE/LE Coordination activities;Wheelchair propulsion/positioning   Therapy Documentation Precautions:  Precautions Precautions: Sternal;Fall Precaution Comments:  (Pt able to recall 1/5 sternal precautions) Restrictions Weight Bearing Restrictions: Yes RUE Weight Bearing: Non weight bearing LUE Weight Bearing: Non weight bearing  [name], Ileene Hutchinson 06/11/2013, 4:05 PM

## 2013-06-11 NOTE — Progress Notes (Signed)
Patient returned to [facility]. Report given to RN regarding heart rate and medication administration. Patient has no symptoms at this time .Admission   History reviewed with rehab nurse.

## 2013-06-11 NOTE — Significant Event (Addendum)
Hypoglycemic Event  CBG: 69 Treatment: 15 GM carbohydrate snack  Symptoms: none  Follow-up CBG: Time:2145 CBG Result:98 Possible Reasons for Event: Unknown  Comments/MD notified:no new orders given    [name]  Remember to initiate Hypoglycemia Order Set & complete

## 2013-06-11 NOTE — Progress Notes (Signed)
Note reviewed and accurately reflects treatment session.   

## 2013-06-11 NOTE — Progress Notes (Signed)
Occupational Therapy Session Note  Patient Details  Name: [name] MRN: [id number] Date of Birth: 19-Sep-1934  Today's Date: 06/11/2013  Short Term Goals: Week 1:  OT Short Term Goal 1 (Week 1): STG=LTG secondary to ELOS  Skilled Therapeutic Interventions/Progress Updates:  Session Note: Time: 0930-1030 (60 mins) Pt with no report of pain.  Upon entering room, pt seated on toilet with NT present. OT took session from here. Pt completed peri care and pulling up pants. Pt ambulated with R/W->w/c. Pt engaged in ADL retraining from w/c level at sink. Pt sit<>stand for peri care and pulling up pants. RN notified of need to change dressing on pt's backside. Pt sit<>stand for this. Pt able to bring bilateral feet up to opposite leg to increase independence with LB ADL's. OT donned bilateral TED hose and abdominal binder. From here, pt ambulated with R/W->laundry room with frequent rest breaks (total of ~100 ft). Pt completed simulated laundry task (supervision). Pt ambulated with R/W ~50 ft then OT pushed pt in w/c->room. Pt has increased anxiety when standing/ambulating for 1+ mins limiting his participation in therapy. Pt requires mod v.c's for adherence to sternal precautions and safety awareness (locking w/c brakes when standing/sitting). Once in room, pt ambulated with R/W->bed. At end of tx session, pt in "chair" position in bed with call bell and phone within reach. Pt's family present. OT discussed OT's recommendation of 24 hr supervision once pt is d/c home. Pt's family stated they understood.  Therapy Documentation Precautions:  Precautions Precautions: Sternal;Fall Precaution Comments:  (Pt able to recall 1/5 sternal precautions) Restrictions Weight Bearing Restrictions: Yes RUE Weight Bearing: Non weight bearing LUE Weight Bearing: Non weight bearing  See FIM for current functional status  Therapy/Group: Individual Therapy  Yanin Muhlestein 06/11/2013, 7:24 AM

## 2013-06-11 NOTE — Progress Notes (Signed)
NUTRITION FOLLOW UP  Intervention:   1.  General healthful diet; encouraged intake and discussed barriers to intake with pt and wife.   2.  Nutrition-related medications; continues to report nausea. 3.  Supplements; Nepro BID, butter pecan  Nutrition Dx:   Inadequate oral intake, ongoing.  Monitor:   1.  Food/Beverage; pt meeting >/=90% estimated needs with tolerance. 2.  Wt/wt change; monitor trends 3.  Knowledge; pt with basic understanding of diet education  Assessment:   Admitted to rehab 2/2 deconditioning s/p hospitalization. Pt required cardiac cath and emergent CABG during his acute hospitalization. Currently receiving ongoing intermittent HD, being followed by renal.  Pt with decreased intake the beginning of this week which has not improved.  Pt with anxiety r/t to renal prognosis.  Pt with nausea, dizziness, and general weakness.  PO variable 0-75% of meals. Pt states his intake is consistent with his usual portion at meals, however does endorse eating more meals throughout the day typically.  Willing to try Nepro.  Height: Ht Readings from Last 1 Encounters:  06/05/13 6' 0.83" (1.85 m)    Weight Status:   Wt Readings from Last 1 Encounters:  06/11/13 215 lb 13.3 oz (97.9 kg)    Re-estimated needs:  Kcal: 1800-2100 Protein: 75-95g Fluid: 2.0 L/day  Skin: Stage 2 sacrum, abdominal inicision, sternal incision, leg incision, neck incision  Diet Order: Dysphagia 3, Heart healthy   Intake/Output Summary (Last 24 hours) at 06/11/13 1157 Last data filed at 06/11/13 0933  Gross per 24 hour  Intake    240 ml  Output      0 ml  Net    240 ml    Last BM: 8/6  Labs:   Recent Labs Lab 06/09/13 1437 06/10/13 0642 06/11/13 0605  NA 135 137 138  K 4.9 4.2 4.1  CL 98 100 99  CO2 23 25 28   BUN 49* 23 34*  CREATININE 9.90* 6.05* 8.37*  CALCIUM 8.5 8.4 8.4  PHOS 3.5 3.2 3.0  GLUCOSE 97 87 79    CBG (last 3)   Recent Labs  06/10/13 2048 06/11/13 0717  06/11/13 1115  GLUCAP 82 82 79    Scheduled Meds: . atorvastatin  20 mg Oral q1800  . calcium acetate  667 mg Oral TID WC  . darbepoetin (ARANESP) injection - NON-DIALYSIS  60 mcg Subcutaneous Q Tue-1800  . ferric gluconate (FERRLECIT/NULECIT) IV  125 mg Intravenous Q T,Th,Sa-HD  . heparin  4,600 Units Intravenous Once  . insulin aspart  0-5 Units Subcutaneous QHS  . insulin aspart  0-9 Units Subcutaneous TID WC  . levothyroxine  100 mcg Oral QAC breakfast  . multivitamin  1 tablet Oral Daily  . pantoprazole  40 mg Oral Daily  . senna-docusate  2 tablet Oral QHS  . sodium chloride  3 mL Intravenous Q12H  . warfarin  1 mg Oral ONCE-1800  . Warfarin - Pharmacist Dosing Inpatient   Does not apply q1800    Continuous Infusions:   Loyce Dys, MS RD LDN Clinical Inpatient Dietitian Pager: [phone] Weekend/After hours pager: [phone]

## 2013-06-11 NOTE — Progress Notes (Signed)
ANTICOAGULATION CONSULT NOTE - Follow Up Consult  Pharmacy Consult for Coumadin Indication: atrial fibrillation, history of DVT  Allergies  Allergen Reactions  . 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    Patient Measurements: Height: 6' 0.83" (185 cm) Weight: 215 lb 13.3 oz (97.9 kg) IBW/kg (Calculated) : 79.52 Heparin Dosing Weight:   Vital Signs: Temp: 98.3 F (36.8 C) (08/07 0514) Temp src: Oral (08/07 0514) BP: 92/49 mmHg (08/07 0518) Pulse Rate: 95 (08/07 0518)  Labs:  Recent Labs  06/09/13 0845 06/09/13 1437 06/10/13 0642 06/11/13 0605  LABPROT 32.1*  --  32.7* 30.3*  INR 3.27*  --  3.35* 3.03*  CREATININE  --  9.90* 6.05* 8.37*    Estimated Creatinine Clearance: 9.1 ml/min (by C-G formula based on Cr of 8.37).   Medications:  Scheduled:  . atorvastatin  20 mg Oral q1800  . calcium acetate  667 mg Oral TID WC  . darbepoetin (ARANESP) injection - NON-DIALYSIS  60 mcg Subcutaneous Q Tue-1800  . ferric gluconate (FERRLECIT/NULECIT) IV  125 mg Intravenous Q T,Th,Sa-HD  . heparin  4,600 Units Intravenous Once  . insulin aspart  0-5 Units Subcutaneous QHS  . insulin aspart  0-9 Units Subcutaneous TID WC  . levothyroxine  100 mcg Oral QAC breakfast  . multivitamin  1 tablet Oral Daily  . pantoprazole  40 mg Oral Daily  . senna-docusate  2 tablet Oral QHS  . sodium chloride  3 mL Intravenous Q12H  . Warfarin - Pharmacist Dosing Inpatient   Does not apply q1800  . white petrolatum        Assessment: 79yo male with AFib and history of DVT, on chronic Coumadin pta.  INR 3.03;  Within goal range now after being >3 for past 3 days and no coumadin given for last  3 days.   Previous doses 7/30 through 8/3 were 2.5mg  daily.   No bleeding problems noted. Anemia of chronic disease-continues on IV nulecit and SQ aranesp.    Anuric dialysis dependent AKI since 7/17 in setting of ACS/cardiogenic shock, contrast exposure.   Goal of Therapy:  INR 2-3 Monitor platelets  by anticoagulation protocol: Yes   Plan:  1.  Coumadin 1 mg today 2.  Daily INR  Noah Delaine, RPh Clinical Pharmacist Pager: [phone] 06/11/2013 09:25 AM

## 2013-06-11 NOTE — Progress Notes (Deleted)
Occupational Therapy Discharge Summary  Patient Details  Name: [name] MRN: [id number] Date of Birth: 19-Apr-1934  Today's Date: 06/11/2013  Patient has made slow progress on CIR secondary to lack of motivation and medical issues such as hypotension and complaints of dizziness which have interfered with therapy sessions. Patient has met 9 of 11 long term goals due to improved activity tolerance, improved balance, postural control, ability to compensate for deficits, improved awareness and improved coordination. Patient to discharge at overall Supervision level.  Patient's wife and son have been present for multiple tx sessions. OT educated pt's family of need for 24 hr supervision for safety once discharged home.  Reasons goals not met: Pt did not meet LTG: "Complete furniture transfer with supervision". Pt is inconsistent with meeting this goal secondary to the height of the furniture. Pt is able to meet this goal when transferring from higher surfaces but needs min assist to transfer from lower surfaces. OT educated pt on "building up" surfaces to increase his independence with this task. Pt did not meet LTG: "Will adhere to sternal precautions during BADL session with min v.c's". Pt still requires mod-max v.c's for this secondary to non-compliance. Pt verbalizes and understands he has sternal precautions but frequently states he cannot complete tasks without pushing/pulling with his arms.  Recommendation:  Patient is discharging back to acute care in order to continue receiving hemodialysis.   Equipment: Recommending tub transfer bench for use at home once discharged from hospital. May defer to acute care.  Reasons for discharge: treatment goals met. Discharge from CIR.  Patient/family agrees with progress made and goals achieved: Yes  OT Discharge Precautions/Restrictions  Precautions Precautions: Sternal;Fall Restrictions Weight Bearing Restrictions: Yes RUE Weight Bearing: Non  weight bearing LUE Weight Bearing: Non weight bearing  Pain Pain Assessment Pain Assessment: No/denies pain Pain Score: 0-No pain  ADL: See FIM  Vision/Perception  Vision - History Baseline Vision: Other (comment) (wears glasses when driving) Visual History: Other (comment) (hx of bilateral cataracts; sx 6 yrs ago) Vision - Assessment Eye Alignment: Within Functional Limits Perception Perception: Within Functional Limits Praxis Praxis: Intact   Cognition Overall Cognitive Status: Within Functional Limits for tasks assessed Arousal/Alertness: Awake/alert Orientation Level: Oriented X4 Attention: Focused Focused Attention: Appears intact Sustained Attention: Appears intact Memory Impairment: Decreased recall of new information Awareness Impairment: Emergent impairment Problem Solving: Impaired Problem Solving Impairment: Functional basic Safety/Judgment: Impaired Comments: pt has difficulty adhering to sternal precautions  Sensation Sensation Light Touch: Appears Intact Stereognosis: Appears Intact Hot/Cold: Appears Intact Proprioception: Appears Intact Coordination Gross Motor Movements are Fluid and Coordinated: Yes Fine Motor Movements are Fluid and Coordinated: Yes  Motor: See D/C navigator  Mobility: See D/C navigator  Trunk/Postural Assessment: See D/C navigator  Balance: See D/C navigator  Extremity/Trunk Assessment RUE Assessment RUE Assessment: Within Functional Limits LUE Assessment LUE Assessment: Within Functional Limits  See FIM for current functional status  Akeema Broder 06/11/2013, 10:39 AM

## 2013-06-11 NOTE — Progress Notes (Signed)
Reviewed and in agreement with treatment provided.  

## 2013-06-11 NOTE — Progress Notes (Signed)
Speech Language Pathology Weekly Progress Note  Patient Details  Name: [name] MRN: [id number] Date of Birth: [birth year]  Today's Date: 06/11/2013 Time: 1115-1200 Time Calculation (min): 45 min  Short Term Goals: Week 1: SLP Short Term Goal 1 (Week 1): Pt will use external aids to demonstrate orientation to person, time, location, and situation with Mod I. SLP Short Term Goal 1 - Progress (Week 1): Met SLP Short Term Goal 2 (Week 1): Pt will demonstrate basic problem solving during functional task with Min cues. SLP Short Term Goal 2 - Progress (Week 1): Not met SLP Short Term Goal 3 (Week 1): Pt will sustain attention to functional task for 10 min with Min cues. SLP Short Term Goal 3 - Progress (Week 1): Met SLP Short Term Goal 4 (Week 1): Pt will demonstrate adequate medication management using external aids with Min cues. SLP Short Term Goal 4 - Progress (Week 1): Not met SLP Short Term Goal 5 (Week 1): Pt will consume regular textures with thin liquids with no s/s of aspiration. SLP Short Term Goal 5 - Progress (Week 1): Discontinued (comment) (pt appears to be at baseline level of functioning)  New Short Term Goals: Week 2: SLP Short Term Goal 1 (Week 2): Pt will use external aids to demonstrate orientation to person, time, location, and situation with Mod I. SLP Short Term Goal 2 (Week 2): Pt will demonstrate basic problem solving during functional task with Min cues. SLP Short Term Goal 3 (Week 2): Pt will demonstrate adequate medication management using external aids with Min cues. SLP Short Term Goal 4 (Week 2): Pt will sustain attention to functional task for 20 min with Min cues.  Weekly Progress Updates: Pt has met 2/4 STGs this week with improvements noted in orientation and sustained attention. With more information revealed about his previous level of functioning, dysphagia goal was discontinued as he was already at his baseline of Dys. 3 textures. Pt currently  requires Mod cues for basic problem solving and medication management using pillbox. Patient and family education ongoing. Pt would benefit from skilled SLP services to increase functional independence and decrease burden of care.    SLP Intensity: Minumum of 1-2 x/day, 30 to 90 minutes SLP Frequency: 5 out of 7 days SLP Duration/Estimated Length of Stay: 7-10 days SLP Treatment/Interventions: Cognitive remediation/compensation;Cueing hierarchy;Environmental controls;Functional tasks;Internal/external aids;Medication managment;Patient/family education;Therapeutic Activities  Daily Session Skilled Therapeutic Intervention: Session focused on medication management and cognitive goals. SLP facilitated use of pillbox with Mod question cues for selecting an appropriate pillbox based on the frequency he takes his medications. Pt used external aid with supervision level verbal cues to fill pillbox. SLP provided Mod cues for basic problem solving and accuracy. Education provided to pt and wife regarding medication management upon return home as well as memory strategies. Continue plan of care.  IM:  Comprehension Comprehension Mode: Auditory Comprehension: 4-Understands basic 75 - 89% of the time/requires cueing 10 - 24% of the time Expression Expression Mode: Verbal Expression: 5-Expresses basic needs/ideas: With extra time/assistive device Social Interaction Social Interaction: 5-Interacts appropriately 90% of the time - Needs monitoring or encouragement for participation or interaction. Problem Solving Problem Solving: 4-Solves basic 75 - 89% of the time/requires cueing 10 - 24% of the time Memory Memory: 4-Recognizes or recalls 75 - 89% of the time/requires cueing 10 - 24% of the time  Pain   No/denies pain.  Therapy/Group: Individual Therapy  Maxcine Ham 06/11/2013, 3:47 PM  Maxcine Ham, M.A. CCC-SLP

## 2013-06-11 NOTE — Progress Notes (Signed)
Physical Therapy Session Note  Patient Details  Name: [name] MRN: [id number] Date of Birth: 1933-11-30  Today's Date: 06/11/2013 Time: 1400-1456 Time Calculation (min): 56 min  Short Term Goals: Week 1:  PT Short Term Goal 1 (Week 1): Pt will be able to perform supin>sit with min A while maintaining sternal precautions PT Short Term Goal 2 (Week 1): Pt will be able to perform 75 ft gait in controlled environment with S using RW  PT Short Term Goal 3 (Week 1): Pt will be able to perform functional transfers with S while maintaining sternal precautions   Skilled Therapeutic Interventions/Progress Updates:    Therapy session focused on gait training, bed mobility, and dynamic balance. Pt performed gait for 100 ft x 2; 75 ft x3; 30 ft x2 with S and VC's to widen BOS. Pt performed bed mobility (sit<>supine) in simulated home apartment with S and VC's for positioning to come to sit while maintaining sternal precautions. Pt completed 25 ft obstacle course with stairs (up/down 5 stairs x2) and cone navigation with close S. Pt performed standing dynamic balance that consisted of ball tossing/catching and kicking with B/L LE (1set x10 reps) without use of UE support for balance with close S. Pt completed VOR x1 (side<>side) exercises 3x for 30 seconds in supported sitting position for vestibular gaze stabilization training. Pt denied increased reports of dizziness when performing VOR x1 exercises. Pt performed sit<>stand with close S while maintaing sternal precautions. Overall, pt presents with increased endurance and decreased reports of fatigue and dizziness. No c/o pain during therapy session.    Therapy Documentation Precautions:  Precautions Precautions: Sternal;Fall Precaution Comments:  (Pt able to recall 1/5 sternal precautions) Restrictions Weight Bearing Restrictions: Yes RUE Weight Bearing: Non weight bearing LUE Weight Bearing: Non weight bearing  See FIM for current functional  status  Therapy/Group: Individual Therapy  [name], Mihika Surrette 06/11/2013, 2:36 PM

## 2013-06-11 NOTE — Progress Notes (Addendum)
Subjective/Complaints: Feeling better. Really wants to go home Friday. Still can't come to grips with nephrology's opinion regarding his renal function.  A 12 point review of systems has been performed and if not noted above is otherwise negative.   Objective: Vital Signs: Blood pressure 92/49, pulse 95, temperature 98.3 F (36.8 C), temperature source Oral, resp. rate 20, height 6' 0.84" (1.85 m), weight 97.9 kg (215 lb 13.3 oz), SpO2 100.00%. No results found. No results found for this basename: WBC, HGB, HCT, PLT,  in the last 72 hours  Recent Labs  06/10/13 0642 06/11/13 0605  NA 137 138  K 4.2 4.1  CL 100 99  GLUCOSE 87 79  BUN 23 34*  CREATININE 6.05* 8.37*  CALCIUM 8.4 8.4   CBG (last 3)   Recent Labs  06/10/13 1627 06/10/13 2048 06/11/13 0717  GLUCAP 74 82 82    Wt Readings from Last 3 Encounters:  06/11/13 97.9 kg (215 lb 13.3 oz)  06/03/13 99.3 kg (218 lb 14.7 oz)  06/03/13 99.3 kg (218 lb 14.7 oz)    Physical Exam:  Constitutional: He is oriented to person, place, and time. He appears well-developed and well-nourished.  Keeps eyes closed  HENT:  Head: Normocephalic and atraumatic.  Eyes: Conjunctivae are normal. Pupils are equal, round, and reactive to light.  Neck: Normal range of motion. Neck supple. No thyromegaly present.  Cardiovascular: Normal rate. An irregularly irregular rhythm present. No murmur  Pulmonary/Chest: Effort normal and breath sounds normal. No respiratory distress. He has no wheezes.  Abdominal: Soft. Bowel sounds are normal. He exhibits no distension. There is no tenderness.  Musculoskeletal: He exhibits no edema and no tenderness. Right greater troch tender to touch.  Stasis changes BLE. Incisions BLE clean, dry And intact.  Neurological: He is oriented to person, place, and time. Appropriate conversationally. Used calendar to recall day. Able to follow simple commands without difficulty. Motor strength is 4/5 in left hip flexor  knee extensor ankle dorsiflexor and plantar flexor 3 minus right hip flexor, knee extensors, ankle dorsiflexor plantar flexor, 5/5 bilateral deltoid, bicep, tricep, grip. Decreased sensation right foot compared to left foot, but difference negligible   Assessment/Plan: 1. Functional deficits secondary to deconditioning after CABG/multiple medical issues which require 3+ hours per day of interdisciplinary therapy in a comprehensive inpatient rehab setting. Physiatrist is providing close team supervision and 24 hour management of active medical problems listed below. Physiatrist and rehab team continue to assess barriers to discharge/monitor patient progress toward functional and medical goals.  I have discussed HD scenario with pt. He understands that we are in a "grey" area in regard to his renal function. Nephrology is not yet ready to declare him endstage, and therefore, he's not a candidate for outpt HD. I told him that we will seek [facility] as the first option for ongoing HD. If [facility] cannot happen, then he will need to return to acute. I spoke at length with Dr. Lendell Caprice ([facility])       FIM: FIM - Bathing Bathing Steps Patient Completed: Chest;Right Arm;Left Arm;Abdomen;Front perineal area;Buttocks;Right upper leg;Left upper leg;Right lower leg (including foot);Left lower leg (including foot) Bathing: 5: Supervision: Safety issues/verbal cues  FIM - Upper Body Dressing/Undressing Upper body dressing/undressing steps patient completed: Thread/unthread right sleeve of front closure shirt/dress;Button/unbutton shirt;Pull shirt around back of front closure shirt/dress;Thread/unthread left sleeve of front closure shirt/dress Upper body dressing/undressing: 5: Set-up assist to: Obtain clothing/put away FIM - Lower Body Dressing/Undressing Lower body dressing/undressing steps patient  completed: Thread/unthread right underwear leg;Thread/unthread left underwear leg;Pull underwear  up/down;Pull pants up/down;Fasten/unfasten pants;Don/Doff right shoe;Don/Doff left shoe;Thread/unthread right pants leg;Thread/unthread left pants leg Lower body dressing/undressing: 5: Set-up assist to: Don/Doff TED stocking  FIM - Toileting Toileting steps completed by patient: Adjust clothing prior to toileting;Performs perineal hygiene;Adjust clothing after toileting Toileting Assistive Devices: Grab bar or rail for support Toileting: 0: Activity did not occur  FIM - Diplomatic Services operational officer Devices: Grab bars Toilet Transfers: 0-Activity did not occur  FIM - Banker Devices: Therapist, occupational: 4: Supine > Sit: Min A (steadying Pt. > 75%/lift 1 leg);5: Bed > Chair or W/C: Supervision (verbal cues/safety issues)  FIM - Locomotion: Wheelchair Locomotion: Wheelchair: 1: Travels less than 50 ft with minimal assistance (Pt.>75%) FIM - Locomotion: Ambulation Locomotion: Ambulation Assistive Devices: Designer, industrial/product Ambulation/Gait Assistance: 5: Supervision Locomotion: Ambulation: 1: Travels less than 50 ft with supervision/safety issues  Comprehension Comprehension Mode: Auditory Comprehension: 4-Understands basic 75 - 89% of the time/requires cueing 10 - 24% of the time  Expression Expression Mode: Verbal Expression: 5-Expresses basic needs/ideas: With extra time/assistive device  Social Interaction Social Interaction Mode: Asleep Social Interaction: 5-Interacts appropriately 90% of the time - Needs monitoring or encouragement for participation or interaction.  Problem Solving Problem Solving Mode: Asleep Problem Solving: 4-Solves basic 75 - 89% of the time/requires cueing 10 - 24% of the time  Memory Memory Mode: Asleep Memory: 3-Recognizes or recalls 50 - 74% of the time/requires cueing 25 - 49% of the time  Medical Problem List and Plan:  1. DVT Prophylaxis/Anticoagulation: Pharmaceutical: Coumadin  2.  Pain Management: Will discontinue hydrocodone and use tylenol prn for pain.  3. Mood: Has anxiety regarding renal status. Family reports confusion/lethargy past HD sessions. Will monitor and offer ego support. LCSW to follow for evaluation.  4. Neuropsych: This patient is capable of making decisions on his own behalf.  5. Acute on Chronic renal failure: Continues with poor urine output. Check daily weights, strict I & O. Continue renal diet. HD--T,T,Sa.   -discussed with renal MD regarding follow up HD. Apparently, since he's not been declared "endstage" he's not eligible for outpt HD. May have to move back to acute to complete HD 6. Anemia of chronic disease: On nulecit IV for now.  7. Atrial Fibrillation: Monitor with bid checks. Amiodarone d/c'ed and coreg increased. HR controlled to borderline  8. Peripheral edema:  TEDs for use when OOB.  9. Constipation: PRN lax's.  10. Nausea:  Likely multifactorial related to renal/meds. Is moving bowels  -diet liberalized. Trying to encourage PO 11. Orthostasis:  -Volume mgt per nephrology  -TEDS/Abd binder  -improved as a whole. Still drops at times    LOS (Days) 8 A FACE TO FACE EVALUATION WAS PERFORMED  Derrick Tiegs T 06/11/2013 8:22 AM

## 2013-06-11 NOTE — Progress Notes (Signed)
Patient exhibiting tachycardia with heart rates of 110-140. Patient history and medications reviewed. Patient has no symptoms at this time. Patient has been given 250 normal saline bolus to decrease heart rate. Blood pressure is 125/72. Ultrafiltration off per Dr Lynda Rainwater. Metoprolol 25 mg has been ordered and will be administered once received. Patient appears to be comfortable with no distress noted. Oxygen at 1 liter has been applied. Patient has been placed on hemodialysis telemetry

## 2013-06-12 ENCOUNTER — Inpatient Hospital Stay (HOSPITAL_COMMUNITY): Payer: Medicare Other | Admitting: Speech Pathology

## 2013-06-12 ENCOUNTER — Inpatient Hospital Stay (HOSPITAL_COMMUNITY)
Admission: AD | Admit: 2013-06-12 | Discharge: 2013-06-16 | DRG: 682 | Disposition: A | Discharge status: Home Health | Payer: Medicare Other | Source: Ambulatory Visit | Attending: Internal Medicine | Admitting: Internal Medicine

## 2013-06-12 ENCOUNTER — Encounter (HOSPITAL_COMMUNITY): Payer: Self-pay | Admitting: Internal Medicine

## 2013-06-12 ENCOUNTER — Inpatient Hospital Stay (HOSPITAL_COMMUNITY): Payer: Medicare Other

## 2013-06-12 ENCOUNTER — Inpatient Hospital Stay (HOSPITAL_COMMUNITY): Payer: Medicare Other | Admitting: Occupational Therapy

## 2013-06-12 DIAGNOSIS — N189 Chronic kidney disease, unspecified: Secondary | ICD-10-CM

## 2013-06-12 DIAGNOSIS — Z992 Dependence on renal dialysis: Secondary | ICD-10-CM

## 2013-06-12 DIAGNOSIS — Z79899 Other long term (current) drug therapy: Secondary | ICD-10-CM

## 2013-06-12 DIAGNOSIS — J189 Pneumonia, unspecified organism: Secondary | ICD-10-CM

## 2013-06-12 DIAGNOSIS — Z7901 Long term (current) use of anticoagulants: Secondary | ICD-10-CM

## 2013-06-12 DIAGNOSIS — I480 Paroxysmal atrial fibrillation: Secondary | ICD-10-CM | POA: Diagnosis present

## 2013-06-12 DIAGNOSIS — I2510 Atherosclerotic heart disease of native coronary artery without angina pectoris: Secondary | ICD-10-CM

## 2013-06-12 DIAGNOSIS — J9600 Acute respiratory failure, unspecified whether with hypoxia or hypercapnia: Secondary | ICD-10-CM

## 2013-06-12 DIAGNOSIS — R5381 Other malaise: Secondary | ICD-10-CM

## 2013-06-12 DIAGNOSIS — I5043 Acute on chronic combined systolic (congestive) and diastolic (congestive) heart failure: Secondary | ICD-10-CM

## 2013-06-12 DIAGNOSIS — N179 Acute kidney failure, unspecified: Secondary | ICD-10-CM | POA: Diagnosis present

## 2013-06-12 DIAGNOSIS — D649 Anemia, unspecified: Secondary | ICD-10-CM | POA: Diagnosis present

## 2013-06-12 DIAGNOSIS — I4891 Unspecified atrial fibrillation: Secondary | ICD-10-CM | POA: Diagnosis present

## 2013-06-12 DIAGNOSIS — Z86718 Personal history of other venous thrombosis and embolism: Secondary | ICD-10-CM

## 2013-06-12 DIAGNOSIS — Z794 Long term (current) use of insulin: Secondary | ICD-10-CM

## 2013-06-12 DIAGNOSIS — Z951 Presence of aortocoronary bypass graft: Secondary | ICD-10-CM

## 2013-06-12 DIAGNOSIS — E440 Moderate protein-calorie malnutrition: Secondary | ICD-10-CM | POA: Diagnosis present

## 2013-06-12 DIAGNOSIS — F3289 Other specified depressive episodes: Secondary | ICD-10-CM | POA: Diagnosis present

## 2013-06-12 DIAGNOSIS — E162 Hypoglycemia, unspecified: Secondary | ICD-10-CM | POA: Diagnosis present

## 2013-06-12 DIAGNOSIS — M129 Arthropathy, unspecified: Secondary | ICD-10-CM | POA: Diagnosis present

## 2013-06-12 DIAGNOSIS — I120 Hypertensive chronic kidney disease with stage 5 chronic kidney disease or end stage renal disease: Principal | ICD-10-CM | POA: Diagnosis present

## 2013-06-12 DIAGNOSIS — I252 Old myocardial infarction: Secondary | ICD-10-CM

## 2013-06-12 DIAGNOSIS — I214 Non-ST elevation (NSTEMI) myocardial infarction: Secondary | ICD-10-CM | POA: Diagnosis present

## 2013-06-12 DIAGNOSIS — N186 End stage renal disease: Secondary | ICD-10-CM | POA: Diagnosis present

## 2013-06-12 DIAGNOSIS — F329 Major depressive disorder, single episode, unspecified: Secondary | ICD-10-CM | POA: Diagnosis present

## 2013-06-12 DIAGNOSIS — D631 Anemia in chronic kidney disease: Secondary | ICD-10-CM | POA: Diagnosis present

## 2013-06-12 DIAGNOSIS — H9190 Unspecified hearing loss, unspecified ear: Secondary | ICD-10-CM | POA: Diagnosis present

## 2013-06-12 LAB — GLUCOSE, CAPILLARY: Glucose-Capillary: 69 mg/dL — ABNORMAL LOW (ref 70–99)

## 2013-06-12 LAB — RENAL FUNCTION PANEL
Albumin: 2.2 g/dL — ABNORMAL LOW (ref 3.5–5.2)
Calcium: 8.2 mg/dL — ABNORMAL LOW (ref 8.4–10.5)
Creatinine, Ser: 5.38 mg/dL — ABNORMAL HIGH (ref 0.50–1.35)
GFR calc non Af Amer: 9 mL/min — ABNORMAL LOW (ref >90)
Sodium: 139 mEq/L (ref 135–145)

## 2013-06-12 MED ORDER — SORBITOL 70 % SOLN: 30.0000 mL | Freq: Every day | Status: DC | PRN

## 2013-06-12 MED ORDER — LEVOTHYROXINE SODIUM 100 MCG PO TABS
100.0000 ug | ORAL_TABLET | Freq: Every day | ORAL | Status: DC
  Administered 2013-06-13 – 2013-06-16 (×4): 100 ug via ORAL
  Filled 2013-06-12 (×5): qty 1

## 2013-06-12 MED ORDER — ATORVASTATIN CALCIUM 20 MG PO TABS
20.0000 mg | ORAL_TABLET | Freq: Every day | ORAL | Status: DC
  Administered 2013-06-12 – 2013-06-15 (×4): 20 mg via ORAL
  Filled 2013-06-12 (×5): qty 1

## 2013-06-12 MED ORDER — INSULIN ASPART 100 UNIT/ML ~~LOC~~ SOLN: 0.0000 [IU] | Freq: Three times a day (TID) | SUBCUTANEOUS | Status: DC

## 2013-06-12 MED ORDER — ALBUTEROL SULFATE (5 MG/ML) 0.5% IN NEBU: 2.5000 mg | INHALATION_SOLUTION | Freq: Four times a day (QID) | RESPIRATORY_TRACT | Status: DC | PRN

## 2013-06-12 MED ORDER — INSULIN ASPART 100 UNIT/ML ~~LOC~~ SOLN: 0.0000 [IU] | Freq: Every day | SUBCUTANEOUS | Status: DC

## 2013-06-12 MED ORDER — WARFARIN SODIUM 2 MG PO TABS
2.0000 mg | ORAL_TABLET | Freq: Once | ORAL | Status: DC
  Filled 2013-06-12: qty 1

## 2013-06-12 MED ORDER — ACETAMINOPHEN 325 MG PO TABS: 325.0000 mg | ORAL_TABLET | ORAL | Status: DC | PRN

## 2013-06-12 MED ORDER — DIPHENHYDRAMINE HCL 12.5 MG/5ML PO ELIX
12.5000 mg | ORAL_SOLUTION | Freq: Four times a day (QID) | ORAL | Status: DC | PRN
Start: 2013-06-12 — End: 2013-06-16
  Administered 2013-06-12: 25 mg via ORAL
  Filled 2013-06-12: qty 10

## 2013-06-12 MED ORDER — SENNOSIDES-DOCUSATE SODIUM 8.6-50 MG PO TABS: 2.0000 | ORAL_TABLET | Freq: Every day | ORAL | Status: DC

## 2013-06-12 MED ORDER — BOOST / RESOURCE BREEZE PO LIQD
1.0000 | Freq: Three times a day (TID) | ORAL | Status: DC
  Administered 2013-06-12 – 2013-06-16 (×10): 1 via ORAL

## 2013-06-12 MED ORDER — SENNOSIDES-DOCUSATE SODIUM 8.6-50 MG PO TABS
2.0000 | ORAL_TABLET | Freq: Every day | ORAL | Status: DC
  Administered 2013-06-12 – 2013-06-14 (×3): 2 via ORAL
  Filled 2013-06-12 (×5): qty 2

## 2013-06-12 MED ORDER — PANTOPRAZOLE SODIUM 40 MG PO TBEC: 40.0000 mg | DELAYED_RELEASE_TABLET | Freq: Every day | ORAL | Status: DC

## 2013-06-12 MED ORDER — TRAZODONE 25 MG HALF TABLET: 25.0000 mg | ORAL_TABLET | Freq: Every evening | ORAL | Status: DC | PRN

## 2013-06-12 MED ORDER — ONDANSETRON HCL 4 MG/2ML IJ SOLN
4.0000 mg | Freq: Four times a day (QID) | INTRAMUSCULAR | Status: DC | PRN
  Administered 2013-06-13 – 2013-06-16 (×3): 4 mg via INTRAVENOUS
  Filled 2013-06-12 (×2): qty 2

## 2013-06-12 MED ORDER — SODIUM CHLORIDE 0.9 % IV SOLN: 125.0000 mg | INTRAVENOUS | Status: DC

## 2013-06-12 MED ORDER — WARFARIN SODIUM 2 MG PO TABS: 2.0000 mg | ORAL_TABLET | Freq: Once | ORAL | Status: DC

## 2013-06-12 MED ORDER — HYDROCODONE-ACETAMINOPHEN 5-325 MG PO TABS: 1.0000 | ORAL_TABLET | Freq: Four times a day (QID) | ORAL | Status: DC | PRN

## 2013-06-12 MED ORDER — DIPHENHYDRAMINE HCL 12.5 MG/5ML PO ELIX: 12.5000 mg | ORAL_SOLUTION | Freq: Four times a day (QID) | ORAL | Status: DC | PRN

## 2013-06-12 MED ORDER — DARBEPOETIN ALFA-POLYSORBATE 60 MCG/0.3ML IJ SOLN
60.0000 ug | INTRAMUSCULAR | Status: DC
  Filled 2013-06-12: qty 0.3

## 2013-06-12 MED ORDER — CALCIUM ACETATE 667 MG PO CAPS
667.0000 mg | ORAL_CAPSULE | Freq: Three times a day (TID) | ORAL | Status: DC
  Administered 2013-06-13 – 2013-06-16 (×11): 667 mg via ORAL
  Filled 2013-06-12 (×14): qty 1

## 2013-06-12 MED ORDER — RENA-VITE PO TABS
1.0000 | ORAL_TABLET | Freq: Every day | ORAL | Status: DC
  Administered 2013-06-12 – 2013-06-15 (×4): 1 via ORAL
  Filled 2013-06-12 (×4): qty 1

## 2013-06-12 MED ORDER — CAMPHOR-MENTHOL 0.5-0.5 % EX LOTN: TOPICAL_LOTION | Freq: Two times a day (BID) | CUTANEOUS | Status: DC

## 2013-06-12 MED ORDER — WARFARIN SODIUM 2.5 MG PO TABS
2.5000 mg | ORAL_TABLET | Freq: Once | ORAL | Status: AC
  Administered 2013-06-12: 2.5 mg via ORAL
  Filled 2013-06-12 (×2): qty 1

## 2013-06-12 MED ORDER — WARFARIN - PHARMACIST DOSING INPATIENT
Freq: Every day | Status: DC
  Administered 2013-06-15: 18:00:00

## 2013-06-12 MED ORDER — ONDANSETRON HCL 4 MG PO TABS
4.0000 mg | ORAL_TABLET | Freq: Four times a day (QID) | ORAL | Status: DC | PRN
  Administered 2013-06-14 – 2013-06-16 (×3): 4 mg via ORAL
  Filled 2013-06-12 (×4): qty 1

## 2013-06-12 MED ORDER — WARFARIN SODIUM 2.5 MG PO TABS
2.5000 mg | ORAL_TABLET | Freq: Once | ORAL | Status: DC
  Filled 2013-06-12: qty 1

## 2013-06-12 MED ORDER — TRAZODONE 25 MG HALF TABLET
25.0000 mg | ORAL_TABLET | Freq: Every evening | ORAL | Status: DC | PRN
  Administered 2013-06-12: 50 mg via ORAL
  Filled 2013-06-12 (×2): qty 2

## 2013-06-12 MED ORDER — PANTOPRAZOLE SODIUM 40 MG PO TBEC
40.0000 mg | DELAYED_RELEASE_TABLET | Freq: Every day | ORAL | Status: DC
  Administered 2013-06-12 – 2013-06-16 (×5): 40 mg via ORAL
  Filled 2013-06-12 (×4): qty 1

## 2013-06-12 MED ORDER — PROMETHAZINE HCL 12.5 MG PO TABS: 12.5000 mg | ORAL_TABLET | ORAL | Status: DC | PRN

## 2013-06-12 NOTE — Discharge Summary (Signed)
Physician Discharge Summary  Patient ID: [name] MRN: [id number] DOB/AGE: Sep 08, 1934 78 y.o.  Admit date: 06/03/2013 Discharge date: 06/12/2013  Discharge Diagnoses:  Principal Problem:   Physical deconditioning Active Problems:   Acute on chronic combined systolic and diastolic CHF, NYHA class 4   S/P CABG x 4   Acute on chronic renal failure   PAF (paroxysmal atrial fibrillation)   Discharged Condition: Improved.   Significant Diagnostic Studies: Dg Chest 2 View  05/31/2013   RADIOLOGY REPORT:  Clinical Data: Status post CABG.  CHEST - 2 VIEW  Comparison: Chest x-ray 05/29/2013.    IMPRESSION: 1.  Support apparatus and postoperative changes, as above. 2.  Mild cardiomegaly with mild pulmonary venous congestion, but no frank pulmonary edema. 3.  Atherosclerosis.   Original Report Authenticated By: Trudie Reed, M.D.    US Renal  06/08/2013   RADIOLOGY REPORT: Clinical Data: No acute kidney injury.  Evaluate for obstruction.  RENAL/URINARY TRACT ULTRASOUND COMPLETE  Comparison:  CT 05/23/2007 :  IMPRESSION: Findings are consistent with medical renal disease with increased renal parenchymal echogenicity bilaterally and renal cortical thinning on the left.  There are probable bilateral renal cysts too small to fully characterize on ultrasound.  No hydronephrosis.   Original Report Authenticated By: Amie Portland, M.D.    Labs:  Basic Metabolic Panel:  Recent Labs Lab 06/06/13 0500 06/07/13 9562 06/08/13 0530 06/09/13 1437 06/10/13 1308 06/11/13 0605 06/12/13 0609  NA 140 139 141 135 137 138 139  K 4.2 3.7 4.0 4.9 4.2 4.1 4.0  CL 100 102 102 98 100 99 101  CO2 28 30 30 23 25 28 30   GLUCOSE 81 80 80 97 87 79 79  BUN 37* 19 31* 49* 23 34* 17  CREATININE 7.46* 4.83* 7.41* 9.90* 6.05* 8.37* 5.38*  CALCIUM 8.2* 8.2* 8.5 8.5 8.4 8.4 8.2*  PHOS 4.5 3.1 3.9 3.5 3.2 3.0 2.7    CBC:  Recent Labs Lab 06/06/13 0500 06/08/13 0530  WBC 6.9 6.3  HGB 8.8* 9.1*  HCT  30.4* 31.5*  MCV 89.7 90.0  PLT 230 240    CBG:  Recent Labs Lab 06/11/13 1115 06/11/13 2124 06/11/13 2148 06/12/13 0729 06/12/13 1146  GLUCAP 79 69* 98 82 99    Filed Vitals:   06/12/13 0608  BP: 126/55  Pulse: 76  Temp:   Resp:    Brief HPI:   [name] is a 79 y.o. male with PMhx significant for HTN, RA, hypothyroidism along with diffuse vascular dz to include CAD, PAD, AAA and common iliac artery dissection. He also has hx of atrial flutter and CKD --Cr 1.77 when admitted on 7/9 with CP and recurrent a flutter with positive troponin. He required CABG the same day. Post op with hypotension as well as decrease in UOP with fluid overload and renal failure.  He was started on dialysis but has had problems with tolerance due to low BP's. A Fib treated with IV heparin and resumption of chronic coumadin. Rate controlled on amiodarone and coreg. He had had bouts of confusion as well as dizziness and nausea with mobility. Nephrology doubts renal recovery and patient will need to be hospitalized until deemed to be ESRD.   Hospital Course: [name] was admitted to rehab 06/03/2013 for inpatient therapies to consist of PT, ST and OT at least three hours five days a week. Past admission physiatrist, therapy team and rehab RN have worked together to provide customized collaborative inpatient rehab. Patient  continued to have problems with orthostatis as well as soft blood pressures in HD therefore coreg was discontinued. Vestibular evaluation was done revealing gaze instability and VOR /gaze stabilization exercises were initiated.   Intermittent bouts of A fib in dialysis has been treated with prn doses of metoprolol. He has also had nausea and itching with HD treatments. Phenergan has been more effective than Zofran in helping ameliorate his symptoms. Diet was liberalized to regular to help with his very poor po intake. He has reached supervision level overall but needs to have  continued stay in an institution till deemed ESRD. TH were contacted to transfer patient to acute services for medical management until time requirements met and patient discharged to acute services on 06/12/13.    Rehab course: During patient's stay in rehab weekly team conferences were held to monitor patient's progress, set goals and discuss barriers to discharge. He is has been educated on sternal precautions as well as VOR exercises and is able to perform adhere and perform these tasks. He has improved endurance and reports decrease in fatigue and dizziness. No complaints of pain. He requires supervision for transfers and mobility of 100 ft X 2 with use of RW and cues to widen BOS.     Disposition:  Acute services.   Diet:  2 gram salt.  2000cc FR. Restrict to 80-2-2 once po intake improves.   Special Instructions: 1. Paint prior pacer wire site with betadine tid. 2. Continue sternal precautions till cleared by Dr. Laneta Simmers. 3. Continue post CABG SSI protocol. 4.  Check daily weights. Strict intake/output. 5. Pharmacy to manage coumadin--INR goal 2-3.        Future Appointments Provider Department Dept Phone   06/12/2013 4:45 PM Kara Pacer [place], [place] [facility] [phone]   06/24/2013 12:30 PM Alleen Borne, MD [facility] [phone]       Medication List    STOP taking these medications       carvedilol 3.125 MG tablet  Commonly known as:  COREG     ondansetron 4 MG tablet  Commonly known as:  ZOFRAN     simvastatin 40 MG tablet  Commonly known as:  ZOCOR      TAKE these medications       acetaminophen 325 MG tablet  Commonly known as:  TYLENOL  Take 1-2 tablets (325-650 mg total) by mouth every 4 (four) hours as needed.     albuterol (5 MG/ML) 0.5% nebulizer solution  Commonly known as:  PROVENTIL  Take 0.5 mLs (2.5 mg total) by nebulization every 6 (six) hours as needed for  wheezing or shortness of breath.     atorvastatin 20 MG tablet  Commonly known as:  LIPITOR  Take 1 tablet (20 mg total) by mouth daily at 6 PM.     calcium acetate 667 MG capsule  Commonly known as:  PHOSLO  Take 1 capsule (667 mg total) by mouth 3 (three) times daily with meals.     camphor-menthol lotion  Commonly known as:  SARNA  Apply topically 2 (two) times daily.     darbepoetin 60 MCG/0.3ML Soln injection  Commonly known as:  ARANESP  Inject 0.3 mLs (60 mcg total) into the skin every Tuesday at 6 PM.     diphenhydrAMINE 12.5 MG/5ML elixir  Commonly known as:  BENADRYL  Take 5-10 mLs (12.5-25 mg total) by mouth 4 (four) times daily as needed for itching.  feeding supplement Liqd  Take 1 Container by mouth 3 (three) times daily between meals.     guaiFENesin-dextromethorphan 100-10 MG/5ML syrup  Commonly known as:  ROBITUSSIN DM  Take 15 mLs by mouth every 4 (four) hours as needed for cough.     HYDROcodone-acetaminophen 5-325 MG per tablet  Commonly known as:  NORCO/VICODIN  Take 1-2 tablets by mouth every 4 (four) hours as needed.     insulin aspart 100 UNIT/ML injection  Commonly known as:  novoLOG  Inject 0-5 Units into the skin at bedtime.     insulin aspart 100 UNIT/ML injection  Commonly known as:  novoLOG  Inject 0-9 Units into the skin 3 (three) times daily with meals.     levothyroxine 100 MCG tablet  Commonly known as:  SYNTHROID, LEVOTHROID  Take 1 tablet (100 mcg total) by mouth daily before breakfast.     multivitamin Tabs tablet  Take 1 tablet by mouth daily.     pantoprazole 40 MG tablet  Commonly known as:  PROTONIX  Take 1 tablet (40 mg total) by mouth daily.     promethazine 12.5 MG tablet  Commonly known as:  PHENERGAN  Take 1 tablet (12.5 mg total) by mouth every 4 (four) hours as needed.     senna-docusate 8.6-50 MG per tablet  Commonly known as:  Senokot-S  Take 2 tablets by mouth at bedtime.     sodium chloride 0.9 % SOLN  100 mL with ferric gluconate 12.5 MG/ML SOLN 125 mg  Inject 125 mg into the vein Every Tuesday,Thursday,and Saturday with dialysis.     sorbitol 70 % Soln  Take 30 mLs by mouth daily as needed.     traZODone 25 mg Tabs tablet  Commonly known as:  DESYREL  Take 0.5-1 tablets (25-50 mg total) by mouth at bedtime as needed for sleep.     warfarin 2 MG tablet  Commonly known as:  COUMADIN  Take 1 tablet (2 mg total) by mouth one time only at 6 PM.       Follow-up Information   Call Ranelle Oyster, MD. (As needed)    Contact information:   [address], [address] [place] [address] [phone]       Follow up with Nona Dell, MD. (follow up 2 weeks past discharge)    Contact information:   [address] [place] [address] [phone]       Follow up with Alleen Borne, MD On 06/24/2013. (Follow up appointment at 12:30 pm)    Contact information:   [address] [place] [address] [phone]       Signed: Jacquelynn Cree 06/12/2013, 2:58 PM

## 2013-06-12 NOTE — Progress Notes (Signed)
Physical Therapy Session Note  Patient Details  Name: [name] MRN: [id number] Date of Birth: [birth year]  Today's Date: 06/12/2013 Time: 1030-1129 Time Calculation (min): 59 min  Short Term Goals: Week 2:  PT Short Term Goal 1 (Week 2): =LTG  Skilled Therapeutic Interventions/Progress Updates:    Therapy session focused on gait training, bed mobility, and functional transfers. Pt performed gait for 100 ft x2, 75 ft x2, 50 ft x2 with RW and close S with w/c follow for seated rest breaks as needed. Pt performed bed mobility (sit<>supine) in the simulated apartment with close S and did not need VC's today to adhere to sternal precautions. Pt completed sit<>stand transfers with S - mod I as the pt requires VC's for UE placement on RW at times. Pt performed car transfers with S and VC's to adhere to sternal precautions as to not lift leg with UE into car. Pt is able to recall 4/5 sternal precautions. Pt performed stair negotiation (up/down 10 stairs) with close S. Pt reports c/o dizziness immediately after completing stair training therefore vital signs assessed which indicated symptomatic orthostatic hypotension with TED hose and binder on (start of therapy BP: 108/64 mmHg; HR 82 SpO2: 97%; after stairs: BP: 97/56 mmHg). Pt returned to bed at end of therapy session with feet elevated and nursing staff notified. Minimal c/o pain along right lateral rib cage that he attributes to the abdominal binder therefore pt is wearing the binder low around hips instead of around abdomen.   Therapy Documentation Precautions:  Precautions Precautions: Sternal;Fall Precaution Comments:  (Pt able to recall 1/5 sternal precautions) Restrictions Weight Bearing Restrictions: Yes RUE Weight Bearing: Non weight bearing LUE Weight Bearing: Non weight bearing  See FIM for current functional status  Therapy/Group: Individual Therapy  [name], Ileene Hutchinson 06/12/2013, 10:30 AM

## 2013-06-12 NOTE — Progress Notes (Signed)
Late entry . Patient discharged to 6E17 at 1630. Patient alert and oriented x 4 . No complaint of pain or nausea. Patient verbalized understanding of discharge to other unit . Continue with plan of care .           Cleotilde Neer

## 2013-06-12 NOTE — Progress Notes (Signed)
Patient ID: [name], male   DOB: 1934-02-16, 79 y.o.   MRN: [id number] Subjective/Complaints: Feeling better. Really wants to go home.  Appreciate nephro note A 12 point review of systems has been performed and if not noted above is otherwise negative.   Objective: Vital Signs: Blood pressure 126/55, pulse 76, temperature 98 F (36.7 C), temperature source Oral, resp. rate 17, height 6' 0.84" (1.85 m), weight 92.8 kg (204 lb 9.4 oz), SpO2 94.00%. No results found. No results found for this basename: WBC, HGB, HCT, PLT,  in the last 72 hours  Recent Labs  06/11/13 0605 06/12/13 0609  NA 138 139  K 4.1 4.0  CL 99 101  GLUCOSE 79 79  BUN 34* 17  CREATININE 8.37* 5.38*  CALCIUM 8.4 8.2*   CBG (last 3)   Recent Labs  06/11/13 2124 06/11/13 2148 06/12/13 0729  GLUCAP 69* 98 82    Wt Readings from Last 3 Encounters:  06/12/13 92.8 kg (204 lb 9.4 oz)  06/03/13 99.3 kg (218 lb 14.7 oz)  06/03/13 99.3 kg (218 lb 14.7 oz)    Physical Exam:  Constitutional: He is oriented to person, place, and time. He appears well-developed and well-nourished.  Keeps eyes closed  HENT:  Head: Normocephalic and atraumatic.  Eyes: Conjunctivae are normal. Pupils are equal, round, and reactive to light.  Neck: Normal range of motion. Neck supple. No thyromegaly present.  Cardiovascular: Normal rate. An irregularly irregular rhythm present. No murmur  Pulmonary/Chest: Effort normal and breath sounds normal. No respiratory distress. He has no wheezes.  Abdominal: Soft. Bowel sounds are normal. He exhibits no distension. There is no tenderness.  Musculoskeletal: He exhibits no edema and no tenderness. Right greater troch tender to touch.  Stasis changes BLE. Incisions BLE clean, dry And intact.  Neurological: He is oriented to person, place, and time. Appropriate conversationally. Used calendar to recall day. Able to follow simple commands without difficulty. Motor strength is 4/5 in left  hip flexor knee extensor ankle dorsiflexor and plantar flexor 3 minus right hip flexor, knee extensors, ankle dorsiflexor plantar flexor, 5/5 bilateral deltoid, bicep, tricep, grip.   Assessment/Plan: 1. Functional deficits secondary to deconditioning after CABG/multiple medical issues which require 3+ hours per day of interdisciplinary therapy in a comprehensive inpatient rehab setting. Physiatrist is providing close team supervision and 24 hour management of active medical problems listed below. Physiatrist and rehab team continue to assess barriers to discharge/monitor patient progress toward functional and medical goals.  FIM: FIM - Bathing Bathing Steps Patient Completed: Chest;Right Arm;Left Arm;Abdomen;Front perineal area;Buttocks;Right upper leg;Left upper leg;Right lower leg (including foot);Left lower leg (including foot) Bathing: 4: Steadying assist  FIM - Upper Body Dressing/Undressing Upper body dressing/undressing steps patient completed: Thread/unthread right sleeve of front closure shirt/dress;Button/unbutton shirt;Pull shirt around back of front closure shirt/dress;Thread/unthread left sleeve of front closure shirt/dress Upper body dressing/undressing: 7: Complete Independence: No helper FIM - Lower Body Dressing/Undressing Lower body dressing/undressing steps patient completed: Thread/unthread right underwear leg;Thread/unthread left underwear leg;Pull underwear up/down;Pull pants up/down;Don/Doff right shoe;Don/Doff left shoe;Thread/unthread right pants leg;Thread/unthread left pants leg Lower body dressing/undressing: 5: Supervision: Safety issues/verbal cues  FIM - Toileting Toileting steps completed by patient: Adjust clothing prior to toileting;Adjust clothing after toileting;Performs perineal hygiene Toileting Assistive Devices: Grab bar or rail for support Toileting: 5: Supervision: Safety issues/verbal cues  FIM - Archivist Transfers Assistive Devices:  Grab bars;Elevated toilet seat;Walker Toilet Transfers: 5-To toilet/BSC: Supervision (verbal cues/safety issues);5-From toilet/BSC: Supervision (verbal cues/safety issues)  FIM - Bed/Chair Transfer Bed/Chair Transfer Assistive Devices: Therapist, occupational: 5: Supine > Sit: Supervision (verbal cues/safety issues);5: Sit > Supine: Supervision (verbal cues/safety issues)  FIM - Locomotion: Wheelchair Locomotion: Wheelchair: 1: Travels less than 50 ft with minimal assistance (Pt.>75%) FIM - Locomotion: Ambulation Locomotion: Ambulation Assistive Devices: Designer, industrial/product Ambulation/Gait Assistance: 5: Supervision Locomotion: Ambulation: 2: Travels 50 - 149 ft with supervision/safety issues  Comprehension Comprehension Mode: Auditory Comprehension: 5-Follows basic conversation/direction: With extra time/assistive device  Expression Expression Mode: Verbal Expression: 5-Expresses basic needs/ideas: With extra time/assistive device  Social Interaction Social Interaction Mode: Asleep Social Interaction: 5-Interacts appropriately 90% of the time - Needs monitoring or encouragement for participation or interaction.  Problem Solving Problem Solving Mode: Asleep Problem Solving: 4-Solves basic 75 - 89% of the time/requires cueing 10 - 24% of the time  Memory Memory Mode: Asleep Memory: 4-Recognizes or recalls 75 - 89% of the time/requires cueing 10 - 24% of the time  Medical Problem List and Plan:  1. DVT Prophylaxis/Anticoagulation: Pharmaceutical: Coumadin  2. Pain Management: Will discontinue hydrocodone and use tylenol prn for pain.  3. Mood: Has anxiety regarding renal status. Family reports confusion/lethargy past HD sessions. Will monitor and offer ego support. LCSW to follow for evaluation.  4. Neuropsych: This patient is capable of making decisions on his own behalf.  5. Acute on Chronic renal failure: Continues with poor urine output. Check daily weights, strict I & O.  Continue renal diet. HD--T,T,Sa.   -discussed with renal MD regarding follow up HD. Apparently, since he's not been declared "endstage" he's not eligible for outpt HD. May have to move back to acute to complete HD if not candidate for [facility] 6. Anemia of chronic disease: On nulecit IV for now.  7. Atrial Fibrillation: Monitor with bid checks. Amiodarone d/c'ed and coreg increased. HR controlled to borderline  8. Peripheral edema:  TEDs for use when OOB.  9. Constipation: PRN lax's.  10. Nausea:  Likely multifactorial related to renal/meds. Is moving bowels  -diet liberalized. Trying to encourage PO 11. Orthostasis:  -Volume mgt per nephrology  -TEDS/Abd binder  -improved as a whole. Still drops at times    LOS (Days) 9 A FACE TO FACE EVALUATION WAS PERFORMED  Erick Colace 06/12/2013 9:53 AM

## 2013-06-12 NOTE — Progress Notes (Signed)
Report called to [phone] to "[name]" RN . Patient to be moved to 6E. Order received . Continue with plan of care .        [name]

## 2013-06-12 NOTE — Progress Notes (Signed)
Social Work  Discharge Note  The overall goal for the admission was met for:   Discharge location: Yes - aware upon admission that pt would like d/c back to acute for continued HD until he can be accepted at an OP HD Center   Length of Stay: Yes - 9 days  Discharge activity level: Yes - supervision overall  Home/community participation: Yes  Services provided included: MD, RD, PT, OT, RN, TR, Pharmacy and SW  Financial Services: Medicare and Private Insurance: [employer]  Follow-up services arranged: Other: transfer back to acute unit @ [facility]  Comments (or additional information):  Patient/Family verbalized understanding of follow-up arrangements: Yes  Individual responsible for coordination of the follow-up plan: patient  Confirmed correct DME delivered: None order as he was tranferred back to acute.  Livingston Denner

## 2013-06-12 NOTE — Progress Notes (Signed)
Occupational Therapy Session Note  Patient Details  Name: [name] MRN: [id number] Date of Birth: 1934-01-05  Today's Date: 06/12/2013  Short Term Goals: Week 1:  OT Short Term Goal 1 (Week 1): STG=LTG secondary to ELOS  Skilled Therapeutic Interventions/Progress Updates:  Session Note: Time: 0730-0825 (55 mins) Pt reported 6/10 in R hip. Pt stated he did not want pain meds this AM.  Upon entering room, pt supine in bed. Pt->EOB and OT donned abdominal binder. Pt then ambulated with R/W->BR. Pt transferred<>toilet and completed 3/3 toileting tasks with supervision. From here, pt engaged in ADL retraining at sink. Pt completed all tasks in standing focusing on fxal endurance. Pt ambulated with R/W to collect clothing for BADL session and pt educated on safety with R/W (not holding clothing in hands while using R/W). OT donned bilateral TED hose. Pt ambulated with R/W->bed. Skilled intervention focused on BADL's, dynamic standing balance/endurance, sit<>stand, adherence to sternal precautions, and overall activity tolerance. Pt requires frequent rest breaks secondary to increased anxiety during standing. Pt requires max v.c's for adherence to sternal precautions and general safety (locking w/c brakes). At end of tx session, pt seated in "chair" position in bed with call bell and phone within reach. Pt eating breakfast. BA on.  Therapy Documentation Precautions:  Precautions Precautions: Sternal;Fall Precaution Comments:  (Pt able to recall 1/5 sternal precautions) Restrictions Weight Bearing Restrictions: Yes RUE Weight Bearing: Non weight bearing LUE Weight Bearing: Non weight bearing  See FIM for current functional status  Therapy/Group: Individual Therapy  Harlie Buening 06/12/2013, 7:26 AM

## 2013-06-12 NOTE — Progress Notes (Signed)
ANTICOAGULATION CONSULT NOTE  Pharmacy Consult for Coumadin Indication: atrial fibrillation, history of DVT  Allergies  Allergen Reactions  . 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    Patient Measurements:   Heparin Dosing Weight:   Vital Signs: Temp: 98.4 F (36.9 C) (08/08 1700) Temp src: Oral (08/08 1700) BP: 127/64 mmHg (08/08 1700) Pulse Rate: 70 (08/08 1700)  Labs:  Recent Labs  06/10/13 0642 06/11/13 0605 06/12/13 0609  LABPROT 32.7* 30.3* 27.6*  INR 3.35* 3.03* 2.68*  CREATININE 6.05* 8.37* 5.38*    The CrCl is unknown because both a height and weight (above a minimum accepted value) are required for this calculation.   Medications:  Scheduled:  . [START ON 06/13/2013] atorvastatin  20 mg Oral q1800  . [START ON 06/13/2013] calcium acetate  667 mg Oral TID WC  . [START ON 06/16/2013] darbepoetin  60 mcg Subcutaneous Q Tue-1800  . feeding supplement  1 Container Oral TID BM  . insulin aspart  0-5 Units Subcutaneous QHS  . [START ON 06/13/2013] insulin aspart  0-9 Units Subcutaneous TID WC  . [START ON 06/13/2013] levothyroxine  100 mcg Oral QAC breakfast  . multivitamin  1 tablet Oral Daily  . pantoprazole  40 mg Oral Daily  . senna-docusate  2 tablet Oral QHS    Assessment: 79yo male with AFib and history of DVT, on chronic Coumadin pta.  INR 2.68;  Previous doses 7/30 through 8/3 were 2.5mg  daily.   No bleeding problems noted. Anemia of chronic disease-continues on IV nulecit and SQ aranesp.    Anuric dialysis dependent AKI since 7/17 in setting of ACS/cardiogenic shock, contrast exposure.  **Patient has now transferred back to inpatient from rehab. Pharmacy to continue to follow and dose warfarin. It does not appear that patient received warfarin on rehab.   Goal of Therapy:  INR 2-3 Monitor platelets by anticoagulation protocol: Yes   Plan:  1.  Coumadin 2.5 mg today 2.  Daily INR  Sheppard Coil PharmD., BCPS Clinical Pharmacist Pager  [phone] 06/12/2013 7:05 PM

## 2013-06-12 NOTE — Progress Notes (Signed)
29M with anuric dialysis dependent AKI since 7/17 in setting of ACS/cardiogenic shock, contrast exposure.  Pt w/ baseline CKD with SCr 1.6-2.2.    Subjective:  HD yesterday with mild tachycardia, UF .  Improved when back to room.  No CP/SOB Minimal nausea on HD No UOP No response from [facility] yet Pt w/ ok appetite  08/07 0701 - 08/08 0700 In: 480 [P.O.:480] Out: 795   Filed Weights   06/11/13 2012 06/11/13 2145 06/12/13 0607  Weight: 93.4 kg (205 lb 14.6 oz) 93.5 kg (206 lb 2.1 oz) 92.8 kg (204 lb 9.4 oz)    Current meds: reviewed  Current Labs: reviewed    Physical Exam:  Blood pressure 126/55, pulse 76, temperature 98 F (36.7 C), temperature source Oral, resp. rate 17, height 6' 0.84" (1.85 m), weight 92.8 kg (204 lb 9.4 oz), SpO2 94.00%. NAD, lying in bed, appears well RRR, nl s1s2. No murmur Faint bibasilar crackles.  Nl wob.  No wheezing Abd soft, nabs No LEE No rasehs R IJ tunneled CVC bandaged aaox3  Assessment/Plan 1. Anuric AKI in setting of CKD since 07/17: No evidence of recovery.  HD on THS schedule.   2. CKD [BL1.6-2.2]: Pts age, pre-existign CKD, and prolonged anuria make likelihood of renal recovery low, however has only been on HD for 3wk. Too early to declare ESRD. [facility] is working towards arangning for hospital bed following completion of their training.  3. CKD-BMD: Ca and Phos acceptable.  Watch Phos, 2.7 today.  4. Anemia: cont ESA, IV Fe.    Sabra Heck MD 06/12/2013, 8:49 AM   Recent Labs Lab 06/10/13 [phone] 06/11/13 0605 06/12/13 0609  NA 137 138 139  K 4.2 4.1 4.0  CL 100 99 101  CO2 25 28 30   GLUCOSE 87 79 79  BUN 23 34* 17  CREATININE 6.05* 8.37* 5.38*  CALCIUM 8.4 8.4 8.2*  PHOS 3.2 3.0 2.7    Recent Labs Lab 06/06/13 0500 06/08/13 0530  WBC 6.9 6.3  HGB 8.8* 9.1*  HCT 30.4* 31.5*  MCV 89.7 90.0  PLT 230 240    Current Facility-Administered Medications  Medication Dose Route Frequency Provider Last Rate Last Dose  .  acetaminophen (TYLENOL) tablet 325-650 mg  325-650 mg Oral Q4H PRN Jacquelynn Cree, PA-C   650 mg at 06/03/13 2033  . albuterol (PROVENTIL) (5 MG/ML) 0.5% nebulizer solution 2.5 mg  2.5 mg Nebulization Q6H PRN Jacquelynn Cree, PA-C      . atorvastatin (LIPITOR) tablet 20 mg  20 mg Oral q1800 Evlyn Kanner Love, PA-C   20 mg at 06/11/13 2147  . bisacodyl (DULCOLAX) suppository 10 mg  10 mg Rectal Daily PRN Jacquelynn Cree, PA-C      . calcium acetate (PHOSLO) capsule 667 mg  667 mg Oral TID WC Evlyn Kanner Love, PA-C   667 mg at 06/12/13 0837  . camphor-menthol (SARNA) lotion   Topical PRN Jacquelynn Cree, PA-C      . darbepoetin [facility]) injection 60 mcg  60 mcg Subcutaneous Q Tue-1800 Jacquelynn Cree, PA-C   60 mcg at 06/10/13 0035  . diphenhydrAMINE (BENADRYL) 12.5 MG/5ML elixir 12.5-25 mg  12.5-25 mg Oral Q6H PRN Jacquelynn Cree, PA-C      . feeding supplement (RESOURCE BREEZE) liquid 1 Container  1 Container Oral TID BM PRN Haynes Bast, RD      . ferric gluconate (NULECIT) 125 mg in sodium chloride 0.9 % 100 mL IVPB  125 mg  Intravenous Q T,Th,Sa-HD Evlyn Kanner Love, PA-C   125 mg at 06/11/13 1956  . guaiFENesin-dextromethorphan (ROBITUSSIN DM) 100-10 MG/5ML syrup 15 mL  15 mL Oral Q4H PRN Jacquelynn Cree, PA-C      . heparin injection 4,600 Units  4,600 Units Intravenous Once Sadie Haber, MD      . HYDROcodone-acetaminophen (NORCO/VICODIN) 5-325 MG per tablet 1-2 tablet  1-2 tablet Oral Q4H PRN Jacquelynn Cree, PA-C   1 tablet at 06/12/13 0039  . insulin aspart (novoLOG) injection 0-5 Units  0-5 Units Subcutaneous QHS Pamela S Love, PA-C      . insulin aspart (novoLOG) injection 0-9 Units  0-9 Units Subcutaneous TID WC Evlyn Kanner Love, PA-C      . levothyroxine (SYNTHROID, LEVOTHROID) tablet 100 mcg  100 mcg Oral QAC breakfast Jacquelynn Cree, PA-C   100 mcg at 06/12/13 4098  . multivitamin (RENA-VIT) tablet 1 tablet  1 tablet Oral Daily Jacquelynn Cree, PA-C   1 tablet at 06/12/13 [id number]  . pantoprazole (PROTONIX) EC  tablet 40 mg  40 mg Oral Daily Jacquelynn Cree, PA-C   40 mg at 06/12/13 4782  . promethazine (PHENERGAN) tablet 12.5 mg  12.5 mg Oral Q4H PRN Jacquelynn Cree, PA-C   12.5 mg at 06/12/13 9562  . senna-docusate (Senokot-S) tablet 2 tablet  2 tablet Oral QHS Jacquelynn Cree, PA-C   2 tablet at 06/11/13 2148  . sodium chloride 0.9 % injection 3 mL  3 mL Intravenous Q12H Pamela S Love, PA-C      . sodium chloride 0.9 % injection 3 mL  3 mL Intravenous PRN Evlyn Kanner Love, PA-C      . sorbitol 70 % solution 30 mL  30 mL Oral Daily PRN Tresa Garter, MD   30 mL at 06/09/13 2142  . traZODone (DESYREL) tablet 25-50 mg  25-50 mg Oral QHS PRN Jacquelynn Cree, PA-C      . Warfarin - Pharmacist Dosing Inpatient   Does not apply q1800 Riki Rusk, [facility]

## 2013-06-12 NOTE — Progress Notes (Signed)
Physical Therapy Discharge Summary  Patient Details  Name: [name] MRN: [id number] Date of Birth: 22-Jun-1934  Today's Date: 06/12/2013  Patient has met 9 of 9 long term goals due to improved activity tolerance, improved balance, improved postural control, increased strength, decreased pain, ability to compensate for deficits and functional use of  right lower extremity and left lower extremity.  Patient to discharge at an ambulatory level Modified Independent for house hold distance and S for community distances for safety. Pt is currently being D/C to the acute care hospital for HD.   Reasons goals not met: N/A all goals met   Recommendation:  Patient will benefit from ongoing skilled PT services in the acute care setting for further strengthening and to continue to improve activity tolerance and cardionvascular endurance in order to continue to advance with safe functional mobility, address ongoing impairments in decreased cardiovascular endurance, gait, decreased strength, and minimize fall risk.  Equipment: No equipment provided  Reasons for discharge: Pt was transfered back to acute for HD. Rehab goals met at this time.   Patient/family agrees with progress made and goals achieved: Yes  PT Discharge Precautions/Restrictions Precautions Precautions: Sternal;Fall Precaution Comments: Pt able to recall 4/5 sternal precations Restrictions Weight Bearing Restrictions: Yes RUE Weight Bearing: Non weight bearing LUE Weight Bearing: Non weight bearing Vision/Perception  Vision - History Baseline Vision: Other (comment) (wears glasses when driving ) Perception Perception: Within Functional Limits Praxis Praxis: Intact  Cognition Overall Cognitive Status: Within Functional Limits for tasks assessed Arousal/Alertness: Awake/alert Safety/Judgment: Appears intact Comments: pt requires VC's to adhering to sternal precautions Sensation Sensation Light Touch: Appears  Intact Coordination Gross Motor Movements are Fluid and Coordinated: Yes Motor  Motor Motor: Within Functional Limits Motor - Skilled Clinical Observations: Generalized weakness and deconditioning   Trunk/Postural Assessment  Cervical Assessment Cervical Assessment: Within Functional Limits Thoracic Assessment Thoracic Assessment: Within Functional Limits Lumbar Assessment Lumbar Assessment: Within Functional Limits Postural Control Postural Control: Within Functional Limits  Balance Balance Balance Assessed: Yes Static Sitting Balance Static Sitting - Level of Assistance: 6: Modified independent (Device/Increase time) Static Standing Balance Static Standing - Level of Assistance: 6: Modified independent (Device/Increase time) Dynamic Standing Balance Dynamic Standing - Level of Assistance: 6: Modified independent (Device/Increase time) Extremity Assessment      RLE Assessment RLE Assessment: Within Functional Limits RLE Strength RLE Overall Strength: Deficits;Due to premorbid status RLE Overall Strength Comments: hip flexion +3/5;knee flexion/extension-4/5; Ankle PF/DF:4/5 LLE Assessment LLE Assessment: Within Functional Limits (Grossly 4/5) LLE Strength LLE Overall Strength Comments:  (Grossly 4/5 MMT throughout )  See FIM for current functional status  [name], Ileene Hutchinson 06/12/2013, 4:19 PM

## 2013-06-12 NOTE — H&P (Signed)
[facility] History and Physical  [name] ZOX:[id number] DOB: [birth year] DOA: 06/12/2013  Referring physician: Rehab MD PCP: Toma Deiters, MD  Specialists:  Nephrology  Chief Complaint: None.   HPI: [name] is a 79 y.o. male patient was transferred on 06/12/13 from inpatient rehabilitation to the hospital for continued inpatient hemodialysis needs. He has completed inpatient rehabilitation. Patient was hospitalized between 05/12/13-06/02/13 for non-STEMI, atrial flutter with variable conduction, EF 45%, ventilatory dependent respiratory failure & cardiogenic shock. Cardiac cath showed 90% distal LM and severe three-vessel coronary artery disease with RCA occlusion. He underwent emergent coronary artery bypass grafting. In the ICU, he developed acute renal failure. Nephrology was consulted and despite conservative measures, his renal functions continued to deteriorate. Dialysis was initiated. Coumadin was initiated for atrial fibrillation and history of DVT. 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 He was discharged and transferred to inpatient rehabilitation. Hospitalist service was requested to readmit patient from CIR for probable ESRD but cannot be clipped for 2.5 weeks. Patient no longer requires inpatient rehabilitation and has not been accepted at [facility]. Patient denies complaints and is anxious to go home.   Review of Systems: All systems reviewed and apart from history of presenting illness, are negative  Past Medical History  Diagnosis Date  . Cancer   . Hypertension   . Collagen vascular disease   . Anginal pain   . Depression   . Arthritis   . Peripheral edema     chronic/wears TEDs  . 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 Eye surgery    . Coronary artery bypass graft N/A 05/13/2013    Procedure: CORONARY ARTERY BYPASS GRAFTING (CABG);  Surgeon: Alleen Borne, MD;  Location: [facility] OR;   Service: Open Heart Surgery;  Laterality: N/A;  . Insertion of dialysis catheter N/A 05/26/2013    Procedure: INSERTION OF DIALYSIS CATHETER;  Surgeon: Sherren Kerns, MD;  Location: [facility] OR;  Service: Vascular;  Laterality: N/A;  right IJ   Social History:  reports that he has never smoked. He does not have any smokeless tobacco history on file. He reports that he does not drink alcohol or use illicit drugs.   Allergies  Allergen Reactions  . 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 patient denies family history.  Prior to Admission medications   Medication Sig Start Date End Date Taking? Authorizing Provider  acetaminophen (TYLENOL) 325 MG tablet Take 1-2 tablets (325-650 mg total) by mouth every 4 (four) hours as needed. 06/12/13   Evlyn Kanner Love, PA-C  albuterol (PROVENTIL) (5 MG/ML) 0.5% nebulizer solution Take 0.5 mLs (2.5 mg total) by nebulization every 6 (six) hours as needed for wheezing or shortness of breath. 06/03/13   Erin Barrett, PA-C  atorvastatin (LIPITOR) 20 MG tablet Take 1 tablet (20 mg total) by mouth daily at 6 PM. 06/03/13   Erin Barrett, PA-C  calcium acetate (PHOSLO) 667 MG capsule Take 1 capsule (667 mg total) by mouth 3 (three) times daily with meals. 06/03/13   Erin Barrett, PA-C  camphor-menthol (SARNA) lotion Apply topically 2 (two) times daily. 06/12/13   Jacquelynn Cree, PA-C  darbepoetin (ARANESP) 60 MCG/0.3ML SOLN Inject 0.3 mLs (60 mcg total) into the skin every Tuesday at 6 PM. 06/03/13   Erin Barrett, PA-C  diphenhydrAMINE (BENADRYL) 12.5 MG/5ML elixir  Take 5-10 mLs (12.5-25 mg total) by mouth 4 (four) times daily as needed for itching. 06/12/13   Jacquelynn Cree, PA-C  feeding supplement (RESOURCE BREEZE) LIQD Take 1 Container by mouth 3 (three) times daily between meals. 06/03/13   Erin Barrett, PA-C  guaiFENesin-dextromethorphan (ROBITUSSIN DM) 100-10 MG/5ML syrup Take 15 mLs by mouth every 4 (four) hours as needed for cough. 06/03/13   Erin  Barrett, PA-C  HYDROcodone-acetaminophen (NORCO/VICODIN) 5-325 MG per tablet Take 1-2 tablets by mouth every 4 (four) hours as needed. 06/03/13   Erin Barrett, PA-C  insulin aspart (NOVOLOG) 100 UNIT/ML injection Inject 0-5 Units into the skin at bedtime. 06/12/13   Evlyn Kanner Love, PA-C  insulin aspart (NOVOLOG) 100 UNIT/ML injection Inject 0-9 Units into the skin 3 (three) times daily with meals. 06/12/13   Jacquelynn Cree, PA-C  levothyroxine (SYNTHROID, LEVOTHROID) 100 MCG tablet Take 1 tablet (100 mcg total) by mouth daily before breakfast. 06/03/13   Erin Barrett, PA-C  multivitamin (RENA-VIT) TABS tablet Take 1 tablet by mouth daily. 06/03/13   Erin Barrett, PA-C  pantoprazole (PROTONIX) 40 MG tablet Take 1 tablet (40 mg total) by mouth daily. 06/12/13   Jacquelynn Cree, PA-C  promethazine (PHENERGAN) 12.5 MG tablet Take 1 tablet (12.5 mg total) by mouth every 4 (four) hours as needed. 06/12/13   Evlyn Kanner Love, PA-C  senna-docusate (SENOKOT-S) 8.6-50 MG per tablet Take 2 tablets by mouth at bedtime. 06/12/13   Jacquelynn Cree, PA-C  sodium chloride 0.9 % SOLN 100 mL with ferric gluconate 12.5 MG/ML SOLN 125 mg Inject 125 mg into the vein Every Tuesday,Thursday,and Saturday with dialysis. 06/12/13 06/23/13  Evlyn Kanner Love, PA-C  sorbitol 70 % SOLN Take 30 mLs by mouth daily as needed. 06/12/13   Jacquelynn Cree, PA-C  traZODone (DESYREL) 25 mg TABS tablet Take 0.5-1 tablets (25-50 mg total) by mouth at bedtime as needed for sleep. 06/12/13   Jacquelynn Cree, PA-C  warfarin (COUMADIN) 2 MG tablet Take 1 tablet (2 mg total) by mouth one time only at 6 PM. 06/12/13   Jacquelynn Cree, PA-C   Physical Exam: Filed Vitals:   06/12/13 1700  BP: 127/64  Pulse: 70  Temp: 98.4 F (36.9 C)  TempSrc: Oral  Resp: 18  SpO2: 96%     General exam: Moderately built and nourished male patient, lying comfortably supine on the bed in no obvious distress.  Head, eyes and ENT: Nontraumatic and normocephalic. Pupils equally reacting to light  and accommodation. Oral mucosa moist.  Neck: Supple. No JVD, carotid bruit or thyromegaly.  Lymphatics: No lymphadenopathy.  Respiratory system: Slightly reduced breath sounds in the bases with coarse/chronic appearing crackles in the bases. Rest of lung fields are clear to auscultation. No increased work of breathing. Midline sternotomy scar has healed well. Patient has hemodialysis catheter right upper chest.  Cardiovascular system: S1 and S2 heard, RRR. No JVD, murmurs, gallops, clicks or pedal edema.  Gastrointestinal system: Abdomen is nondistended, soft and nontender. Normal bowel sounds heard. No organomegaly or masses appreciated.  Central nervous system: Alert and oriented. No focal neurological deficits.  Extremities: Symmetric 5 x 5 power in upper extremities. Grade 4+/5 power in left lower extremity and 4-/5 in right lower extremity. Peripheral pulses symmetrically felt. Vein stripping sites on lower extremities are healing well.  Skin: Patient has stage II clean decubitus ulcers and sacral area without any acute findings.  Musculoskeletal system: Negative exam.  Psychiatry: Pleasant and cooperative.  Labs on Admission:  Basic Metabolic Panel:  Recent Labs Lab 06/08/13 0530 06/09/13 1437 06/10/13 0642 06/11/13 0605 06/12/13 0609  NA 141 135 137 138 139  K 4.0 4.9 4.2 4.1 4.0  CL 102 98 100 99 101  CO2 30 23 25 28 30   GLUCOSE 80 97 87 79 79  BUN 31* 49* 23 34* 17  CREATININE 7.41* 9.90* 6.05* 8.37* 5.38*  CALCIUM 8.5 8.5 8.4 8.4 8.2*  PHOS 3.9 3.5 3.2 3.0 2.7   Liver Function Tests:  Recent Labs Lab 06/08/13 0530 06/09/13 1437 06/10/13 0642 06/11/13 0605 06/12/13 0609  ALBUMIN 2.1* 2.2* 2.1* 2.2* 2.2*   No results found for this basename: LIPASE, AMYLASE,  in the last 168 hours No results found for this basename: AMMONIA,  in the last 168 hours CBC:  Recent Labs Lab 06/06/13 0500 06/08/13 0530  WBC 6.9 6.3  HGB 8.8* 9.1*  HCT 30.4* 31.5*   MCV 89.7 90.0  PLT 230 240   Cardiac Enzymes: No results found for this basename: CKTOTAL, CKMB, CKMBINDEX, TROPONINI,  in the last 168 hours  BNP (last 3 results) No results found for this basename: PROBNP,  in the last 8760 hours CBG:  Recent Labs Lab 06/11/13 1115 06/11/13 2124 06/11/13 2148 06/12/13 0729 06/12/13 1146  GLUCAP 79 69* 98 82 99    Radiological Exams on Admission: No results found.   Assessment/Plan Principal Problem:   Acute on chronic renal failure Active Problems:   NSTEMI (non-ST elevated myocardial infarction)   S/P CABG x 4   Physical deconditioning   PAF (paroxysmal atrial fibrillation)   1. Acute on chronic kidney disease: Possibly precipitated from cardiogenic shock during prior admission. Patient has continued hemodialysis on TTS schedule-states that he was dialyzed on 06/11/13. Nephrology has been following it CIR and states that due to prolonged anuria, likelihood of renal recovery is low. However he has only been on hemodialysis for 3 weeks and it is too early to declare ESRD. Patient has been transferred to the hospital for continued inpatient hemodialysis needs. [facility] consult nephrology. 2. Anemia: Secondary to chronic kidney disease. Stable. Follow CBC in a.m. 3. Paroxysmal A. fib/A. history of DVT: INR 2.68. Coumadin per pharmacy. 4. Recent non-STEMI/status post CABG: Outpatient followup with CTS & cardiology. Stable. 5. Hypertension: Controlled 6. History of hyperglycemia: Was on SSI. Check hemoglobin A1c and continue SSI.     Code Status: Full  Family Communication: None  Disposition Plan: Home when medically stable and cleared by nephrology.   Time spent: 50 minutes  [facility] Pager [phone]  If 7PM-7AM, please contact night-coverage [email or web address] Password [facility] 06/12/2013, 6:23 PM

## 2013-06-12 NOTE — Progress Notes (Signed)
Discharge note reviewed and accurately reflects discharge summary.  

## 2013-06-12 NOTE — Progress Notes (Signed)
Occupational Therapy Discharge Summary  Patient Details  Name: FORRESTER BLANDO MRN: [id number] Date of Birth: [birth year]  Today's Date: 06/12/2013  Patient has made slow progress on CIR secondary to lack of motivation and medical issues such as hypotension and complaints of dizziness which have interfered with therapy sessions. Patient has met 9 of 12 long term goals due to improved activity tolerance, improved balance, postural control, ability to compensate for deficits, improved awareness and improved coordination. Patient to discharge at overall Supervision level. Patient's wife and son have been present for multiple tx sessions. OT educated pt's family of need for 24 hr supervision for safety once discharged home.   Reasons goals not met: Pt did not meet LTG: "Complete furniture transfer with supervision". Pt is inconsistent with meeting this goal secondary to the height of the furniture. Pt is able to meet this goal when transferring from higher surfaces but needs min assist to transfer from lower surfaces. OT educated pt on "building up" surfaces to increase his independence with this task. Pt did not meet LTG: "Will maintain dynamic standing balance for 2+ mins" secondary to increased anxiety when standing/ambulating for extended periods of time. Simple meal prep goal was not addressed. New goal set secondary to lengthened stay on CIR and finding d/c placement for pt to receive dialysis.  Recommendation:  Patient is discharging back to acute care in order to continue receiving hemodialysis.   Equipment:  Recommending tub transfer bench for use at home once discharged from hospital. May defer to acute care.   Reasons for discharge: treatment goals met. Discharge from CIR.   Patient/family agrees with progress made and goals achieved: Yes  OT Discharge Precautions/Restrictions: See D/C navigator  General: See D/C navigator  Vital Signs: See D/C navigator  Pain: See D/C  navigator  ADL: See FIM  Vision/Perception:See D/C navigator    Cognition: See D/C navigator  Sensation: See D/C navigator  Motor: See D/C navigator  Mobility: See D/C navigator  Trunk/Postural Assessment: See D/C navigator  Balance: See D/C navigator  Extremity/Trunk Assessment: See D/C navigator  See FIM for current functional status  Tiffany Calmes 06/12/2013, 3:16 PM

## 2013-06-12 NOTE — Progress Notes (Signed)
Reviewed and in agreement with treatment provided.  

## 2013-06-12 NOTE — Progress Notes (Signed)
Given report from hemodialysis nurse that while in HD patient's heart rate 126,and increasing into the 130's. Patient was given Lopressor 25 mg, and was unable to complete dialysis. Patient arrived onto unit 4W with heart rate in 120's, and asymptomatic. Notified Pam Love, PA given order to check patient heart rate every 30 minutes x 4 hours.On arriving from HD patient's  blood glucose 69 given dinner. Rechecked blood glucose 98. No new orders given. Will continue to monitor patient.   Darnelle Bos, LPN

## 2013-06-12 NOTE — Progress Notes (Signed)
Reviewed and in agreement with discharge assessment provided.  

## 2013-06-12 NOTE — Progress Notes (Signed)
ANTICOAGULATION CONSULT NOTE - Follow Up Consult  Pharmacy Consult for Coumadin Indication: atrial fibrillation, history of DVT  Allergies  Allergen Reactions  . 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   Medications:  Scheduled:  . atorvastatin  20 mg Oral q1800  . calcium acetate  667 mg Oral TID WC  . darbepoetin (ARANESP) injection - NON-DIALYSIS  60 mcg Subcutaneous Q Tue-1800  . ferric gluconate (FERRLECIT/NULECIT) IV  125 mg Intravenous Q T,Th,Sa-HD  . heparin  4,600 Units Intravenous Once  . insulin aspart  0-5 Units Subcutaneous QHS  . insulin aspart  0-9 Units Subcutaneous TID WC  . levothyroxine  100 mcg Oral QAC breakfast  . multivitamin  1 tablet Oral Daily  . pantoprazole  40 mg Oral Daily  . senna-docusate  2 tablet Oral QHS  . sodium chloride  3 mL Intravenous Q12H  . warfarin  2.5 mg Oral ONCE-1800  . Warfarin - Pharmacist Dosing Inpatient   Does not apply q1800    Assessment: 79yo male with AFib and history of DVT, on chronic Coumadin pta.  INR 2.68;  Previous doses 7/30 through 8/3 were 2.5mg  daily.   No bleeding problems noted. Anemia of chronic disease-continues on IV nulecit and SQ aranesp.    Anuric dialysis dependent AKI since 7/17 in setting of ACS/cardiogenic shock, contrast exposure.  Goal of Therapy:  INR 2-3 Monitor platelets by anticoagulation protocol: Yes   Plan:  1.  Coumadin 2 mg today 2.  Daily INR  Thank you. Okey Regal,  PharmD [phone]  06/12/2013 09:25 AM

## 2013-06-12 NOTE — Progress Notes (Signed)
Asked by Rehab med and nephrology to readmit this patient from CIR for probable ESRD, but cannot be "clipped" for 2.5 weeks.  No longer requires inpatient rehab and not accepted at [facility].  Initially admitted with NSTEMI, had CABG. H/o a flutter/fib, transient, DVT, AAA, CKD, HTN.  PCP is [facility] in [place].   Crista Curb, M.D.

## 2013-06-13 ENCOUNTER — Ambulatory Visit (HOSPITAL_COMMUNITY): Payer: Medicare Other | Admitting: Physical Therapy

## 2013-06-13 LAB — RENAL FUNCTION PANEL
BUN: 27 mg/dL — ABNORMAL HIGH (ref 6–23)
Calcium: 8.1 mg/dL — ABNORMAL LOW (ref 8.4–10.5)
Glucose, Bld: 88 mg/dL (ref 70–99)
Phosphorus: 3 mg/dL (ref 2.3–4.6)

## 2013-06-13 LAB — HEMOGLOBIN A1C: Hgb A1c MFr Bld: 4.9 % (ref <5.7)

## 2013-06-13 LAB — CBC
HCT: 32.8 % — ABNORMAL LOW (ref 39.0–52.0)
MCHC: 29 g/dL — ABNORMAL LOW (ref 30.0–36.0)
MCV: 89.4 fL (ref 78.0–100.0)
RDW: 22 % — ABNORMAL HIGH (ref 11.5–15.5)

## 2013-06-13 LAB — GLUCOSE, CAPILLARY
Glucose-Capillary: 98 mg/dL (ref 70–99)
Glucose-Capillary: 104 mg/dL — ABNORMAL HIGH (ref 70–99)

## 2013-06-13 LAB — PROTIME-INR: INR: 2.2 — ABNORMAL HIGH (ref 0.00–1.49)

## 2013-06-13 MED ORDER — WARFARIN SODIUM 2.5 MG PO TABS
2.5000 mg | ORAL_TABLET | Freq: Once | ORAL | Status: AC
  Administered 2013-06-13: 2.5 mg via ORAL
  Filled 2013-06-13: qty 1

## 2013-06-13 NOTE — Procedures (Signed)
I was present at this dialysis session. I have reviewed the session itself and made appropriate changes.   Qb 400 via CVC.  No UOP.  Pt no longer in CIR.  Tolerating HD well so far today, nl HR.  No N/V.    Sabra Heck  MD 06/13/2013, 9:13 AM

## 2013-06-13 NOTE — Progress Notes (Signed)
PT Cancellation  Pt will currently in HD.  Will attempt PT evaluation this PM if time allows.  Thanks!! Troy, PT DPT [phone]

## 2013-06-13 NOTE — Progress Notes (Signed)
[facility] PROGRESS NOTE  [name] ZOX:[id number] DOB: [birth year] DOA: 06/12/2013 PCP: Toma Deiters, MD  Brief narrative 79 y.o. male patient was transferred on 06/12/13 from inpatient rehabilitation to the hospital for continued inpatient hemodialysis needs. He has completed inpatient rehabilitation. Patient was hospitalized between 05/12/13-06/02/13 for non-STEMI, atrial flutter with variable conduction, EF 45%, ventilatory dependent respiratory failure & cardiogenic shock. Cardiac cath showed 90% distal LM and severe three-vessel coronary artery disease with RCA occlusion. He underwent emergent coronary artery bypass grafting. In the ICU, he developed acute renal failure. Nephrology was consulted and despite conservative measures, his renal functions continued to deteriorate. Dialysis was initiated. Coumadin was initiated for atrial fibrillation and history of DVT. 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 He was discharged and transferred to inpatient rehabilitation. Hospitalist service was requested to readmit patient from CIR for probable ESRD but cannot be clipped for 2.5 weeks. Patient no longer requires inpatient rehabilitation and has not been accepted at [facility].  Assessment/Plan:  Acute on chronic kidney disease - Possibly precipitated by cardiogenic shock during prior admission. - Nephrology continues to follow inpatient. - Currently on TTS dialysis schedule. - Per nephrology, due to prolonged in urea, likelihood of renal recovery is low. Also they have not declared him ESRD and hence he cannot be clipped for outpatient HD & followup. - Since this is the main and only reason for his current hospitalization, requested nephrology to take over primary care on to their service. They have assured that they will take over on 06/15/13.  Anemia - Secondary to chronic kidney disease. - Stable.  Paroxysmal A. fib/history of DVT - Anticoagulated. Rate controlled -  Coumadin per pharmacy.  Recent non-STEMI/status post CABG - Stable - Outpatient followup with CTS and cardiology  Hypertension - Controlled   SSI discontinued-A1c only 4.9. Not diabetic. Had a hypoglycemic episode. Continue checking CBG  Code Status: Full Family Communication: None  Disposition Plan: To be determined by nephrology.    Consultants:  Nephrology  Procedures:  Hemodialysis  Antibiotics:  None   HPI/Subjective: Denies complaints.   Objective: Filed Vitals:   06/13/13 0930 06/13/13 0948 06/13/13 0952 06/13/13 1044  BP: 103/59 108/57 113/52 111/55  Pulse: 83 86 85 87  Temp:   96.1 F (35.6 C) 97.6 F (36.4 C)  TempSrc:   Oral Oral  Resp:  24 21 20   Height:      Weight:  91.3 kg (201 lb 4.5 oz)    SpO2:  95% 96% 98%    Intake/Output Summary (Last 24 hours) at 06/13/13 1332 Last data filed at 06/13/13 0952  Gross per 24 hour  Intake      0 ml  Output   1108 ml  Net  -1108 ml   Filed Weights   06/12/13 2157 06/13/13 0700 06/13/13 0948  Weight: 92.85 kg (204 lb 11.2 oz) 92.6 kg (204 lb 2.3 oz) 91.3 kg (201 lb 4.5 oz)    Exam:   General exam: Comfortable.Seen at dialysis this morning.   Respiratory system: Clear. No increased work of breathing.  Cardiovascular system: S1 & S2 heard, RRR. No JVD, murmurs, gallops, clicks or pedal edema.  Gastrointestinal system: Abdomen is nondistended, soft and nontender. Normal bowel sounds heard.  Central nervous system: Alert and oriented. No focal neurological deficits.  Extremities: Symmetric 5 x 5 power in upper extremities & 4+/5 in lower extremities.   Data Reviewed: Basic Metabolic Panel:  Recent Labs Lab 06/09/13 1437 06/10/13 [phone]  06/11/13 0605 06/12/13 0609 06/13/13 0711  NA 135 137 138 139 137  K 4.9 4.2 4.1 4.0 3.7  CL 98 100 99 101 99  CO2 23 25 28 30 27   GLUCOSE 97 87 79 79 88  BUN 49* 23 34* 17 27*  CREATININE 9.90* 6.05* 8.37* 5.38* 7.70*  CALCIUM 8.5 8.4 8.4 8.2* 8.1*   PHOS 3.5 3.2 3.0 2.7 3.0   Liver Function Tests:  Recent Labs Lab 06/09/13 1437 06/10/13 0642 06/11/13 0605 06/12/13 0609 06/13/13 0711  ALBUMIN 2.2* 2.1* 2.2* 2.2* 2.3*   No results found for this basename: LIPASE, AMYLASE,  in the last 168 hours No results found for this basename: AMMONIA,  in the last 168 hours CBC:  Recent Labs Lab 06/08/13 0530 06/13/13 0711  WBC 6.3 8.8  HGB 9.1* 9.5*  HCT 31.5* 32.8*  MCV 90.0 89.4  PLT 240 270   Cardiac Enzymes: No results found for this basename: CKTOTAL, CKMB, CKMBINDEX, TROPONINI,  in the last 168 hours BNP (last 3 results) No results found for this basename: PROBNP,  in the last 8760 hours CBG:  Recent Labs Lab 06/11/13 2148 06/12/13 0729 06/12/13 1146 06/13/13 1012 06/13/13 1148  GLUCAP 98 82 99 64* 84    No results found for this or any previous visit (from the past 240 hour(s)).   Studies: No results found.   Additional labs:   Scheduled Meds: . atorvastatin  20 mg Oral q1800  . calcium acetate  667 mg Oral TID WC  . [START ON 06/16/2013] darbepoetin  60 mcg Subcutaneous Q Tue-1800  . feeding supplement  1 Container Oral TID BM  . levothyroxine  100 mcg Oral QAC breakfast  . multivitamin  1 tablet Oral Daily  . pantoprazole  40 mg Oral Q breakfast  . senna-docusate  2 tablet Oral QHS  . Warfarin - Pharmacist Dosing Inpatient   Does not apply q1800   Continuous Infusions:   Principal Problem:   Acute on chronic renal failure Active Problems:   NSTEMI (non-ST elevated myocardial infarction)   S/P CABG x 4   Physical deconditioning   PAF (paroxysmal atrial fibrillation)   Anemia    Time spent: 25 minutes.    [facility]  [facility] Pager [phone].   If 8PM-8AM, please contact night-coverage at [email or web address], password [facility] 06/13/2013, 1:32 PM  LOS: 1 day

## 2013-06-13 NOTE — Progress Notes (Signed)
ANTICOAGULATION CONSULT NOTE  Pharmacy Consult for Coumadin Indication: atrial fibrillation, history of DVT  Allergies  Allergen Reactions  . 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   Medications:  Scheduled:  . atorvastatin  20 mg Oral q1800  . calcium acetate  667 mg Oral TID WC  . [START ON 06/16/2013] darbepoetin  60 mcg Subcutaneous Q Tue-1800  . feeding supplement  1 Container Oral TID BM  . levothyroxine  100 mcg Oral QAC breakfast  . multivitamin  1 tablet Oral Daily  . pantoprazole  40 mg Oral Q breakfast  . senna-docusate  2 tablet Oral QHS  . Warfarin - Pharmacist Dosing Inpatient   Does not apply q1800    Assessment: 79yo male transferred back from rehab yesterday for inpatient HD needs. He is continued on coumadin for AFib and history of DVT, INR 2.2;  Previous doses 7/30 through 8/3 were 2.5mg  daily. No bleeding problems noted. Anemia of chronic disease-continues on IV nulecit and SQ aranesp.  Hgb 9.5, plt 270 stable. No bleeding noted per chart.   Goal of Therapy:  INR 2-3 Monitor platelets by anticoagulation protocol: Yes   Plan:  1.  Coumadin 2.5 mg today 2.  Daily INR  Bayard Hugger, PharmD, BCPS  Clinical Pharmacist  Pager: [phone]  06/13/2013 1:37 PM

## 2013-06-13 NOTE — Progress Notes (Signed)
INITIAL NUTRITION ASSESSMENT  DOCUMENTATION CODES Per approved criteria  -Non-severe (moderate) malnutrition in the context of acute illness or injury   INTERVENTION:  Nepro Shake (vanilla flavor) PO BID, each supplement provides 425 kcal and 19 grams protein.  NUTRITION DIAGNOSIS: Malnutrition related to inadequate oral intake as evidenced by intake < 75% of estimated energy requirement for > 7 days and mild depletion of muscle mass.   Goal: Intake to meet >90% of estimated nutrition needs.  Monitor:  PO intake, labs, weight trend, renal function  Reason for Assessment: MST=2  79 y.o. male  Admitting Dx: Chronic kidney disease  ASSESSMENT: Patient transferred from Rehab unit for probable ESRD and continued inpatient hemodialysis needs. Patient reports a lot of weight loss, mostly fluid. 8% weight loss in the past week. PO intake was very poor during previous acute hospital stay. Appetite seems to be improving. Per his wife, he ate the best at lunch today than he has in the past month. Patient agrees that he has lost some muscle mass too; he feels a lot weaker than he did a month ago. Patient needs adequate protein intake to support healing of stage 2 pressure ulcer.  Nutrition Focused Physical Exam:  Subcutaneous Fat:  Orbital Region: WNL Upper Arm Region: WNL Thoracic and Lumbar Region: WNL  Muscle:  Temple Region: mild-moderate depletion Clavicle Bone Region: mild-moderate depletion Clavicle and Acromion Bone Region: mild-moderate depletion Scapular Bone Region: WNL Dorsal Hand: WNL Patellar Region: WNL Anterior Thigh Region: mild-moderate depletion Posterior Calf Region: WNL  Edema: none  Height: Ht Readings from Last 1 Encounters:  06/12/13 6\' 1"  (1.854 m)    Weight: Wt Readings from Last 1 Encounters:  06/13/13 201 lb 4.5 oz (91.3 kg)  06/09/13  216 lb 7.9 oz (98.2 kg)   Ideal Body Weight: 83.6 kg  % Ideal Body Weight: 109%  Wt Readings from Last 10  Encounters:  06/13/13 201 lb 4.5 oz (91.3 kg)  06/12/13 204 lb 9.4 oz (92.8 kg)  06/03/13 218 lb 14.7 oz (99.3 kg)  06/03/13 218 lb 14.7 oz (99.3 kg)  06/03/13 218 lb 14.7 oz (99.3 kg)  06/03/13 218 lb 14.7 oz (99.3 kg)  01/06/12 235 lb (106.595 kg)    Usual Body Weight: 218 lb  % Usual Body Weight: 92%  BMI:  Body mass index is 26.56 kg/(m^2).  Estimated Nutritional Needs: Kcal: 2400-2600 Protein: 100-120 gm Fluid: 1.2 L  Skin: stage 2 pressure ulcer to sacrum  Diet Order: Renal 80/90-2-2; Resource Breeze PO TID  EDUCATION NEEDS: -Education not appropriate at this time   Intake/Output Summary (Last 24 hours) at 06/13/13 1033 Last data filed at 06/13/13 0952  Gross per 24 hour  Intake      0 ml  Output   1108 ml  Net  -1108 ml    Last BM: 8/9   Labs:   Recent Labs Lab 06/11/13 0605 06/12/13 0609 06/13/13 0711  NA 138 139 137  K 4.1 4.0 3.7  CL 99 101 99  CO2 28 30 27   BUN 34* 17 27*  CREATININE 8.37* 5.38* 7.70*  CALCIUM 8.4 8.2* 8.1*  PHOS 3.0 2.7 3.0  GLUCOSE 79 79 88    CBG (last 3)   Recent Labs  06/12/13 0729 06/12/13 1146 06/13/13 1012  GLUCAP 82 99 64*    Scheduled Meds: . atorvastatin  20 mg Oral q1800  . calcium acetate  667 mg Oral TID WC  . [START ON 06/16/2013] darbepoetin  60 mcg  Subcutaneous Q Tue-1800  . feeding supplement  1 Container Oral TID BM  . levothyroxine  100 mcg Oral QAC breakfast  . multivitamin  1 tablet Oral Daily  . pantoprazole  40 mg Oral Q breakfast  . senna-docusate  2 tablet Oral QHS  . Warfarin - Pharmacist Dosing Inpatient   Does not apply q1800    Continuous Infusions:   Past Medical History  Diagnosis Date  . Cancer   . Hypertension   . Collagen vascular disease   . Anginal pain   . Depression   . Arthritis   . Peripheral edema     chronic/wears TEDs  . 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 Eye surgery    . Coronary artery  bypass graft N/A 05/13/2013    Procedure: CORONARY ARTERY BYPASS GRAFTING (CABG);  Surgeon: Alleen Borne, MD;  Location: [facility] OR;  Service: Open Heart Surgery;  Laterality: N/A;  . Insertion of dialysis catheter N/A 05/26/2013    Procedure: INSERTION OF DIALYSIS CATHETER;  Surgeon: Sherren Kerns, MD;  Location: [facility] OR;  Service: Vascular;  Laterality: N/A;  right IJ    Joaquin Courts, RD, LDN, CNSC Pager [phone] After Hours Pager [phone]

## 2013-06-14 ENCOUNTER — Ambulatory Visit (HOSPITAL_COMMUNITY): Payer: Medicare Other | Admitting: Physical Therapy

## 2013-06-14 DIAGNOSIS — E440 Moderate protein-calorie malnutrition: Secondary | ICD-10-CM | POA: Insufficient documentation

## 2013-06-14 LAB — PROTIME-INR: INR: 2.02 — ABNORMAL HIGH (ref 0.00–1.49)

## 2013-06-14 LAB — GLUCOSE, CAPILLARY
Glucose-Capillary: 81 mg/dL (ref 70–99)
Glucose-Capillary: 102 mg/dL — ABNORMAL HIGH (ref 70–99)
Glucose-Capillary: 88 mg/dL (ref 70–99)

## 2013-06-14 MED ORDER — WARFARIN SODIUM 2.5 MG PO TABS
2.5000 mg | ORAL_TABLET | Freq: Once | ORAL | Status: AC
  Administered 2013-06-14: 2.5 mg via ORAL
  Filled 2013-06-14: qty 1

## 2013-06-14 NOTE — Progress Notes (Signed)
ANTICOAGULATION CONSULT NOTE  Pharmacy Consult for Coumadin Indication: atrial fibrillation, history of DVT  Allergies  Allergen Reactions  . 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   Medications:  Scheduled:  . atorvastatin  20 mg Oral q1800  . calcium acetate  667 mg Oral TID WC  . [START ON 06/16/2013] darbepoetin  60 mcg Subcutaneous Q Tue-1800  . feeding supplement  1 Container Oral TID BM  . levothyroxine  100 mcg Oral QAC breakfast  . multivitamin  1 tablet Oral Daily  . pantoprazole  40 mg Oral Q breakfast  . senna-docusate  2 tablet Oral QHS  . Warfarin - Pharmacist Dosing Inpatient   Does not apply q1800    Assessment: 79 yo male transferred back from rehab for inpatient HD needs. Pt doing well, awaiting determination for ESRD with prolonged HD and need for CLIP process vs. Renal function recovery.  He continues on coumadin for AFib and history of DVT, INR therapeutic at 2.02.    Goal of Therapy:  INR 2-3 Monitor platelets by anticoagulation protocol: Yes   Plan:  1.  Coumadin 2.5 mg today 2.  Daily INR  [employer], Pharm.D., BCPS Clinical Pharmacist Pager [phone] 06/14/2013 1:49 PM

## 2013-06-14 NOTE — Progress Notes (Signed)
55M with anuric dialysis dependent AKI since 7/17 in setting of ACS/cardiogenic shock, contrast exposure.  Pt w/ baseline CKD with SCr 1.6-2.2.    Subjective:  HD yesterday, UF , tolerated well No other events Pt with two episodes of unmeasured urine, described as small amt, during BM No SOB, CP.   Not eating all of meals, occ posprandial emesis/nausea  08/09 0701 - 08/10 0700 In: 600 [P.O.:600] Out: 1108   Filed Weights   06/13/13 0700 06/13/13 0948 06/13/13 2201  Weight: 92.6 kg (204 lb 2.3 oz) 91.3 kg (201 lb 4.5 oz) 91.6 kg (201 lb 15.1 oz)    Current meds: reviewed including PhosLo, Aranesp Current Labs: reviewed, none today   Physical Exam:  Blood pressure 137/79, pulse 77, temperature 98 F (36.7 C), temperature source Oral, resp. rate 20, height 6\' 1"  (1.854 m), weight 91.6 kg (201 lb 15.1 oz), SpO2 96.00%. NAD, lying in bed, appears well RRR, nl s1s2. No murmur bibasilar crackles.  Nl wob.  No wheezing Abd soft, nabs No LEE No rasehs R IJ tunneled CVC bandaged aaox3  Assessment/Plan 1. Anuric AKI in setting of CKD since 07/17: No evidence of recovery.  HD on THS schedule.   2. CKD [BL1.6-2.2]: Pts age, pre-existign CKD, and prolonged anuria make likelihood of renal recovery low, however has only been on HD since 7/17.  He likely will become ESRD.  Some UOP over past 24h is intriguing, will need to see if beginning of a trend.   3. CKD-BMD: Ca and Phos acceptable.  4. Anemia: cont ESA. Follow Hb  Sabra Heck MD 06/14/2013, 8:54 AM   Recent Labs Lab 06/11/13 0605 06/12/13 0609 06/13/13 0711  NA 138 139 137  K 4.1 4.0 3.7  CL 99 101 99  CO2 28 30 27   GLUCOSE 79 79 88  BUN 34* 17 27*  CREATININE 8.37* 5.38* 7.70*  CALCIUM 8.4 8.2* 8.1*  PHOS 3.0 2.7 3.0    Recent Labs Lab 06/08/13 0530 06/13/13 0711  WBC 6.3 8.8  HGB 9.1* 9.5*  HCT 31.5* 32.8*  MCV 90.0 89.4  PLT 240 270    Current Facility-Administered Medications  Medication Dose  Route Frequency Provider Last Rate Last Dose  . acetaminophen (TYLENOL) tablet 325-650 mg  325-650 mg Oral Q4H PRN Elease Etienne, MD      . albuterol (PROVENTIL) (5 MG/ML) 0.5% nebulizer solution 2.5 mg  2.5 mg Nebulization Q6H PRN Elease Etienne, MD      . atorvastatin (LIPITOR) tablet 20 mg  20 mg Oral q1800 Elease Etienne, MD   20 mg at 06/13/13 1754  . calcium acetate (PHOSLO) capsule 667 mg  667 mg Oral TID WC Elease Etienne, MD   667 mg at 06/14/13 0759  . [START ON 06/16/2013] darbepoetin (ARANESP) injection 60 mcg  60 mcg Subcutaneous Q Tue-1800 Elease Etienne, MD      . diphenhydrAMINE (BENADRYL) 12.5 MG/5ML elixir 12.5-25 mg  12.5-25 mg Oral QID PRN Elease Etienne, MD   25 mg at 06/12/13 2244  . feeding supplement (RESOURCE BREEZE) liquid 1 Container  1 Container Oral TID BM Elease Etienne, MD   1 Container at 06/13/13 1040  . HYDROcodone-acetaminophen (NORCO/VICODIN) 5-325 MG per tablet 1-2 tablet  1-2 tablet Oral Q6H PRN Elease Etienne, MD      . levothyroxine (SYNTHROID, LEVOTHROID) tablet 100 mcg  100 mcg Oral QAC breakfast Elease Etienne, MD   100 mcg at 06/14/13 0759  .  multivitamin (RENA-VIT) tablet 1 tablet  1 tablet Oral Daily Elease Etienne, MD   1 tablet at 06/13/13 1039  . ondansetron (ZOFRAN) tablet 4 mg  4 mg Oral Q6H PRN Elease Etienne, MD       Or  . ondansetron [facility]) injection 4 mg  4 mg Intravenous Q6H PRN Elease Etienne, MD   4 mg at 06/13/13 0705  . pantoprazole (PROTONIX) EC tablet 40 mg  40 mg Oral Q breakfast Elease Etienne, MD   40 mg at 06/14/13 0759  . senna-docusate (Senokot-S) tablet 2 tablet  2 tablet Oral QHS Elease Etienne, MD   2 tablet at 06/13/13 2234  . traZODone (DESYREL) tablet 25-50 mg  25-50 mg Oral QHS PRN Elease Etienne, MD   50 mg at 06/12/13 2243  . Warfarin - Pharmacist Dosing Inpatient   Does not apply q1800 Severiano Gilbert, [facility]

## 2013-06-14 NOTE — Evaluation (Addendum)
Physical Therapy Evaluation Patient Details Name: [name] MRN: [id number] DOB: [birth year] Today's Date: 06/14/2013 Time: 1341-1401 PT Time Calculation (min): 20 min  PT Assessment / Plan / Recommendation History of Present Illness  [name] is a 79 y.o. male patient was transferred on 06/12/13 from inpatient rehabilitation to the hospital for continued inpatient hemodialysis needs. He has completed inpatient rehabilitation. Patient was hospitalized between 05/12/13-06/02/13 for non-STEMI, atrial flutter with variable conduction, EF 45%, ventilatory dependent respiratory failure & cardiogenic shock. Cardiac cath showed 90% distal LM and severe three-vessel coronary artery disease with RCA occlusion. He underwent emergent coronary artery bypass grafting. In the ICU, he developed acute renal failure. Nephrology was consulted and despite conservative measures, his renal functions continued to deteriorate. Dialysis was initiated. Coumadin was initiated for atrial fibrillation and history of DVT. 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 He was discharged and transferred to inpatient rehabilitation. Hospitalist service was requested to readmit patient from CIR for probable ESRD but cannot be clipped for 2.5 weeks. Patient no longer requires inpatient rehabilitation and has not been accepted at [facility]. Patient denies complaints and is anxious to go home. Pt awaiting for opening at OP HD.    Clinical Impression  Limited evaluation due to orthostatic BP causing nausea & dizziness.  Pt wearing abdominal binder however recommend either compression stockings or wrap LE next session due to BP.  Pt will benefit from acute PT services to improve overall mobility and prepare for safe d/c home.     PT Assessment  Patient needs continued PT services    Follow Up Recommendations  Home health PT;Supervision/Assistance - 24 hour    Barriers to Discharge Decreased caregiver support       Equipment Recommendations  None recommended by PT    Frequency Min 3X/week    Precautions / Restrictions Precautions Precautions: Sternal;Fall Precaution Comments: orthostatic Required Braces or Orthoses: Other Brace/Splint Other Brace/Splint: abdominal binder Restrictions Weight Bearing Restrictions: Yes RUE Weight Bearing: Non weight bearing LUE Weight Bearing: Non weight bearing   Pertinent Vitals/Pain No c/o pain Orthostatic BPs     Sitting 113/67     Standing 89/44  Return to sitting 101/61        Mobility  Bed Mobility Bed Mobility: Supine to Sit;Sitting - Scoot to Edge of Bed;Sit to Supine Supine to Sit: 5: Supervision Sitting - Scoot to Edge of Bed: 5: Supervision Sit to Supine: 5: Supervision Details for Bed Mobility Assistance: Supervision for safety and to maintain sternal precautions Transfers Transfers: Sit to Stand;Stand to Sit Sit to Stand: 4: Min guard;From elevated surface;From bed;Without upper extremity assist Stand to Sit: 4: Min guard;Without upper extremity assist;To bed Details for Transfer Assistance: cues needed for hand placement and for sternal precautions to place hands on knees  Ambulation/Gait Ambulation/Gait Assistance: Not tested (comment) (due to dizziness and orthostatics)    Exercises     PT Diagnosis: Difficulty walking  PT Problem List: Decreased activity tolerance;Decreased mobility;Decreased cognition;Decreased knowledge of use of DME;Decreased knowledge of precautions;Decreased safety awareness PT Treatment Interventions: DME instruction;Gait training;Stair training;Therapeutic exercise;Therapeutic activities;Functional mobility training;Cognitive remediation;Patient/family education     PT Goals(Current goals can be found in the care plan section) Acute Rehab PT Goals Patient Stated Goal: to go home PT Goal Formulation: With patient/family Time For Goal Achievement: 06/28/13 Potential to Achieve Goals: Good  Visit  Information  Last PT Received On: 06/14/13 History of Present Illness: [name] is a 79 y.o. male  patient was transferred on 06/12/13 from inpatient rehabilitation to the hospital for continued inpatient hemodialysis needs. He has completed inpatient rehabilitation. Patient was hospitalized between 05/12/13-06/02/13 for non-STEMI, atrial flutter with variable conduction, EF 45%, ventilatory dependent respiratory failure & cardiogenic shock. Cardiac cath showed 90% distal LM and severe three-vessel coronary artery disease with RCA occlusion. He underwent emergent coronary artery bypass grafting. In the ICU, he developed acute renal failure. Nephrology was consulted and despite conservative measures, his renal functions continued to deteriorate. Dialysis was initiated. Coumadin was initiated for atrial fibrillation and history of DVT. 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 He was discharged and transferred to inpatient rehabilitation. Hospitalist service was requested to readmit patient from CIR for probable ESRD but cannot be clipped for 2.5 weeks. Patient no longer requires inpatient rehabilitation and has not been accepted at [facility]. Patient denies complaints and is anxious to go home.       Prior Functioning  Home Living Family/patient expects to be discharged to:: Private residence Living Arrangements: Spouse/significant other Available Help at Discharge: Family;Available 24 hours/day Type of Home: House Home Access: Stairs to enter [employer] of Steps: 3 in front; 2 in back Entrance Stairs-Rails: Right;Left Home Layout: One level Home Equipment: Walker - 2 wheels;Bedside commode;Wheelchair - manual  Lives With: Spouse Prior Function Level of Independence: Independent Comments: Was driving PTA. Communication Communication: No difficulties Dominant Hand: Right    Cognition  Cognition Arousal/Alertness: Awake/alert Behavior During Therapy: Flat  affect Overall Cognitive Status: Within Functional Limits for tasks assessed    Extremity/Trunk Assessment Lower Extremity Assessment Lower Extremity Assessment: Overall WFL for tasks assessed Cervical / Trunk Assessment Cervical / Trunk Assessment: Kyphotic   Balance Balance Balance Assessed: Yes Static Sitting Balance Static Sitting - Balance Support: Feet supported;Bilateral upper extremity supported Static Sitting - Level of Assistance: 6: Modified independent (Device/Increase time) Static Standing Balance Static Standing - Balance Support: Bilateral upper extremity supported Static Standing - Level of Assistance: 5: Stand by assistance Static Standing - Comment/# of Minutes: ~1 minute;  minguard for safety due to dizziness and nauseated and requesting to sit down.  End of Session PT - End of Session Equipment Utilized During Treatment: Gait belt Activity Tolerance: Patient tolerated treatment well Patient left: in bed;with call bell/phone within reach;with family/visitor present Nurse Communication: Mobility status  GP     Ellis Koffler 06/14/2013, 4:24 PM  Jake Shark, PT DPT [phone]

## 2013-06-14 NOTE — Progress Notes (Signed)
[facility] PROGRESS NOTE  [name] ZOX:[id number] DOB: 1933/11/30 DOA: 06/12/2013 PCP: Toma Deiters, MD  Brief narrative 79 y.o. male patient was transferred on 06/12/13 from inpatient rehabilitation to the hospital for continued inpatient hemodialysis needs. He has completed inpatient rehabilitation. Patient was hospitalized between 05/12/13-06/02/13 for non-STEMI, atrial flutter with variable conduction, EF 45%, ventilatory dependent respiratory failure & cardiogenic shock. Cardiac cath showed 90% distal LM and severe three-vessel coronary artery disease with RCA occlusion. He underwent emergent coronary artery bypass grafting. In the ICU, he developed acute renal failure. Nephrology was consulted and despite conservative measures, his renal functions continued to deteriorate. Dialysis was initiated. Coumadin was initiated for atrial fibrillation and history of DVT. 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 He was discharged and transferred to inpatient rehabilitation. Hospitalist service was requested to readmit patient from CIR for probable ESRD but cannot be clipped for 2.5 weeks. Patient no longer requires inpatient rehabilitation and has not been accepted at [facility].  Assessment/Plan:  Acute on chronic kidney disease - Possibly precipitated by cardiogenic shock during prior admission. - Nephrology continues to follow inpatient. - Currently on TTS dialysis schedule. - Per nephrology, due to prolonged in urea, likelihood of renal recovery is low. Also they have not declared him ESRD and hence he cannot be clipped for outpatient HD & followup. - Since this is the main and only reason for his current hospitalization, requested nephrology to take over primary care on to their service. They have assured that they will take over on 06/15/13.  Anemia - Secondary to chronic kidney disease. - Stable.  Paroxysmal A. fib/history of DVT - Anticoagulated. Rate controlled -  Coumadin per pharmacy.  Recent non-STEMI/status post CABG - Stable - Outpatient followup with CTS and cardiology  Hypertension - Controlled   Hypoglycemia SSI discontinued-A1c only 4.9. Not diabetic. Had a hypoglycemic episode on 8/9 after stopping SSI. Continue checking CBG - no further hypoglycemic episodes  Code Status: Full Family Communication: D/W spouse at bedside on 8/10. Disposition Plan: To be determined by nephrology.    Consultants:  Nephrology  Procedures:  Hemodialysis  Antibiotics:  None   HPI/Subjective: Denies complaints.   Objective: Filed Vitals:   06/13/13 1739 06/13/13 2201 06/14/13 0637 06/14/13 1000  BP: 124/56 105/57 137/79 96/62  Pulse: 72 80 77 78  Temp: 98.7 F (37.1 C) 98.7 F (37.1 C) 98 F (36.7 C) 97.7 F (36.5 C)  TempSrc: Oral Oral Oral Oral  Resp: 20 20 20 18   Height:  6\' 1"  (1.854 m)    Weight:  91.6 kg (201 lb 15.1 oz)    SpO2: 94% 94% 96% 98%    Intake/Output Summary (Last 24 hours) at 06/14/13 1130 Last data filed at 06/14/13 0900  Gross per 24 hour  Intake    480 ml  Output      0 ml  Net    480 ml   Filed Weights   06/13/13 0700 06/13/13 0948 06/13/13 2201  Weight: 92.6 kg (204 lb 2.3 oz) 91.3 kg (201 lb 4.5 oz) 91.6 kg (201 lb 15.1 oz)    Exam:   General exam: Comfortable.  Respiratory system: Clear. No increased work of breathing.  Cardiovascular system: S1 & S2 heard, RRR. No JVD, murmurs, gallops, clicks or pedal edema.  Gastrointestinal system: Abdomen is nondistended, soft and nontender. Normal bowel sounds heard.  Central nervous system: Alert and oriented. No focal neurological deficits.  Extremities: Symmetric 5 x 5 power in  upper extremities & 4+/5 in lower extremities.   Data Reviewed: Basic Metabolic Panel:  Recent Labs Lab 06/09/13 1437 06/10/13 0642 06/11/13 0605 06/12/13 0609 06/13/13 0711  NA 135 137 138 139 137  K 4.9 4.2 4.1 4.0 3.7  CL 98 100 99 101 99  CO2 23 25 28 30  27   GLUCOSE 97 87 79 79 88  BUN 49* 23 34* 17 27*  CREATININE 9.90* 6.05* 8.37* 5.38* 7.70*  CALCIUM 8.5 8.4 8.4 8.2* 8.1*  PHOS 3.5 3.2 3.0 2.7 3.0   Liver Function Tests:  Recent Labs Lab 06/09/13 1437 06/10/13 0642 06/11/13 0605 06/12/13 0609 06/13/13 0711  ALBUMIN 2.2* 2.1* 2.2* 2.2* 2.3*   No results found for this basename: LIPASE, AMYLASE,  in the last 168 hours No results found for this basename: AMMONIA,  in the last 168 hours CBC:  Recent Labs Lab 06/08/13 0530 06/13/13 0711  WBC 6.3 8.8  HGB 9.1* 9.5*  HCT 31.5* 32.8*  MCV 90.0 89.4  PLT 240 270   Cardiac Enzymes: No results found for this basename: CKTOTAL, CKMB, CKMBINDEX, TROPONINI,  in the last 168 hours BNP (last 3 results) No results found for this basename: PROBNP,  in the last 8760 hours CBG:  Recent Labs Lab 06/13/13 1012 06/13/13 1148 06/13/13 1647 06/13/13 2155 06/14/13 0753  GLUCAP 64* 84 98 104* 94    No results found for this or any previous visit (from the past 240 hour(s)).   Studies: No results found.   Additional labs:   Scheduled Meds: . atorvastatin  20 mg Oral q1800  . calcium acetate  667 mg Oral TID WC  . [START ON 06/16/2013] darbepoetin  60 mcg Subcutaneous Q Tue-1800  . feeding supplement  1 Container Oral TID BM  . levothyroxine  100 mcg Oral QAC breakfast  . multivitamin  1 tablet Oral Daily  . pantoprazole  40 mg Oral Q breakfast  . senna-docusate  2 tablet Oral QHS  . Warfarin - Pharmacist Dosing Inpatient   Does not apply q1800   Continuous Infusions:   Principal Problem:   Acute on chronic renal failure Active Problems:   NSTEMI (non-ST elevated myocardial infarction)   S/P CABG x 4   Physical deconditioning   PAF (paroxysmal atrial fibrillation)   Anemia   Malnutrition of moderate degree    Time spent: 20 minutes.    [facility]  [facility] Pager [phone].   If 8PM-8AM, please contact night-coverage at [email or web address],  password [facility] 06/14/2013, 11:30 AM  LOS: 2 days

## 2013-06-15 DIAGNOSIS — N186 End stage renal disease: Secondary | ICD-10-CM | POA: Diagnosis not present

## 2013-06-15 LAB — GLUCOSE, CAPILLARY
Glucose-Capillary: 94 mg/dL (ref 70–99)
Glucose-Capillary: 106 mg/dL — ABNORMAL HIGH (ref 70–99)
Glucose-Capillary: 92 mg/dL (ref 70–99)

## 2013-06-15 LAB — PROTIME-INR: INR: 2.17 — ABNORMAL HIGH (ref 0.00–1.49)

## 2013-06-15 MED ORDER — TUBERCULIN PPD 5 UNIT/0.1ML ID SOLN
5.0000 [IU] | Freq: Once | INTRADERMAL | Status: DC
  Administered 2013-06-15: 5 [IU] via INTRADERMAL
  Filled 2013-06-15: qty 0.1

## 2013-06-15 MED ORDER — WARFARIN SODIUM 2.5 MG PO TABS
2.5000 mg | ORAL_TABLET | Freq: Once | ORAL | Status: AC
  Administered 2013-06-15: 2.5 mg via ORAL
  Filled 2013-06-15: qty 1

## 2013-06-15 MED ORDER — RENA-VITE PO TABS
1.0000 | ORAL_TABLET | Freq: Every day | ORAL | Status: DC
  Filled 2013-06-15: qty 1

## 2013-06-15 NOTE — Progress Notes (Signed)
ANTICOAGULATION CONSULT NOTE - Follow Up Consult  Pharmacy Consult for Coumadin Indication: afib and history of DVT  Allergies  Allergen Reactions  . 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   Medications:  Scheduled:  . atorvastatin  20 mg Oral q1800  . calcium acetate  667 mg Oral TID WC  . [START ON 06/16/2013] darbepoetin  60 mcg Subcutaneous Q Tue-1800  . feeding supplement  1 Container Oral TID BM  . levothyroxine  100 mcg Oral QAC breakfast  . multivitamin  1 tablet Oral Daily  . pantoprazole  40 mg Oral Q breakfast  . senna-docusate  2 tablet Oral QHS  . Warfarin - Pharmacist Dosing Inpatient   Does not apply q1800    Assessment: 79 yo male transferred back from rehab for inpatient HD needs. He continues on coumadin for AFib and history of DVT. No signs and/or symptoms of bleeding are noted.  INR therapeutic at 2.17.   Goal of Therapy:  INR 2-3 Monitor platelets by anticoagulation protocol: Yes   Plan:  - Coumadin PO 2.5mg  x1 dose tonight - continue daily INR - monitor for s/s of bleeding  Harrold Donath E. Achilles Dunk, PharmD Clinical Pharmacist - Resident Pager: [phone] Pharmacy: [phone] 06/15/2013 1:15 PM

## 2013-06-15 NOTE — Progress Notes (Signed)
[facility] PROGRESS NOTE  DRU PRIMEAU ZOX:[id number] DOB: 1934/04/28 DOA: 06/12/2013 PCP: Toma Deiters, MD  Brief narrative 79 y.o. male patient was transferred on 06/12/13 from inpatient rehabilitation to the hospital for continued inpatient hemodialysis needs. He has completed inpatient rehabilitation. Patient was hospitalized between 05/12/13-06/02/13 for non-STEMI, atrial flutter with variable conduction, EF 45%, ventilatory dependent respiratory failure & cardiogenic shock. Cardiac cath showed 90% distal LM and severe three-vessel coronary artery disease with RCA occlusion. He underwent emergent coronary artery bypass grafting. In the ICU, he developed acute renal failure. Nephrology was consulted and despite conservative measures, his renal functions continued to deteriorate. Dialysis was initiated. Coumadin was initiated for atrial fibrillation and history of DVT. 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 He was discharged and transferred to inpatient rehabilitation. Hospitalist service was requested to readmit patient from CIR for probable ESRD but cannot be clipped for 2.5 weeks. Patient no longer requires inpatient rehabilitation and has not been accepted at [facility].  Assessment/Plan:  Acute on chronic kidney disease/ New ESRD - Possibly precipitated by cardiogenic shock during prior admission. - Nephrology continues to follow inpatient. - Currently on TTS dialysis schedule. - Per nephrology, no recovery in 3 weeks & suspect that this is permanent - CLIP done 8/11  Anemia - Secondary to chronic kidney disease. - Stable.  Paroxysmal A. fib/history of DVT - Anticoagulated. Rate controlled. INR 2.17 - Coumadin per pharmacy.  Recent non-STEMI/status post CABG - Stable - Outpatient followup with CTS and cardiology  Hypertension - Controlled   Hypoglycemia SSI discontinued-A1c only 4.9. Not diabetic. Had a hypoglycemic episode on 8/9 after stopping SSI.  Continue checking CBG - no further hypoglycemic episodes  Code Status: Full Family Communication: D/W spouse & son at bedside. Disposition Plan: Possible DC home on 8/12.   Consultants:  Nephrology  Procedures:  Hemodialysis  Antibiotics:  None   HPI/Subjective: Denies complaints.   Objective: Filed Vitals:   06/14/13 2112 06/15/13 0559 06/15/13 0930 06/15/13 1400  BP: 112/66 106/64 128/67 114/58  Pulse: 80 77 88 82  Temp: 98.2 F (36.8 C) 98.8 F (37.1 C) 98.3 F (36.8 C) 98.6 F (37 C)  TempSrc: Oral Oral Oral Oral  Resp: 20 20 20 20   Height: 6\' 1"  (1.854 m)     Weight: 91.808 kg (202 lb 6.4 oz)     SpO2: 96% 94% 96% 98%    Intake/Output Summary (Last 24 hours) at 06/15/13 1803 Last data filed at 06/15/13 1300  Gross per 24 hour  Intake    480 ml  Output      0 ml  Net    480 ml   Filed Weights   06/13/13 0948 06/13/13 2201 06/14/13 2112  Weight: 91.3 kg (201 lb 4.5 oz) 91.6 kg (201 lb 15.1 oz) 91.808 kg (202 lb 6.4 oz)    Exam:   General exam: Comfortable.  Respiratory system: Clear. No increased work of breathing.  Cardiovascular system: S1 & S2 heard, RRR. No JVD, murmurs, gallops, clicks or pedal edema.  Gastrointestinal system: Abdomen is nondistended, soft and nontender. Normal bowel sounds heard.  Central nervous system: Alert and oriented. No focal neurological deficits.  Extremities: Symmetric 5 x 5 power in upper extremities & 4+/5 in lower extremities.   Data Reviewed: Basic Metabolic Panel:  Recent Labs Lab 06/09/13 1437 06/10/13 0642 06/11/13 0605 06/12/13 0609 06/13/13 0711  NA 135 137 138 139 137  K 4.9 4.2 4.1 4.0 3.7  CL 98  100 99 101 99  CO2 23 25 28 30 27   GLUCOSE 97 87 79 79 88  BUN 49* 23 34* 17 27*  CREATININE 9.90* 6.05* 8.37* 5.38* 7.70*  CALCIUM 8.5 8.4 8.4 8.2* 8.1*  PHOS 3.5 3.2 3.0 2.7 3.0   Liver Function Tests:  Recent Labs Lab 06/09/13 1437 06/10/13 0642 06/11/13 0605 06/12/13 0609  06/13/13 0711  ALBUMIN 2.2* 2.1* 2.2* 2.2* 2.3*   No results found for this basename: LIPASE, AMYLASE,  in the last 168 hours No results found for this basename: AMMONIA,  in the last 168 hours CBC:  Recent Labs Lab 06/13/13 0711  WBC 8.8  HGB 9.5*  HCT 32.8*  MCV 89.4  PLT 270   Cardiac Enzymes: No results found for this basename: CKTOTAL, CKMB, CKMBINDEX, TROPONINI,  in the last 168 hours BNP (last 3 results) No results found for this basename: PROBNP,  in the last 8760 hours CBG:  Recent Labs Lab 06/14/13 1621 06/14/13 2110 06/15/13 0802 06/15/13 1213 06/15/13 1646  GLUCAP 102* 88 94 86 106*    No results found for this or any previous visit (from the past 240 hour(s)).   Studies: No results found.   Additional labs:   Scheduled Meds: . atorvastatin  20 mg Oral q1800  . calcium acetate  667 mg Oral TID WC  . [START ON 06/16/2013] darbepoetin  60 mcg Subcutaneous Q Tue-1800  . feeding supplement  1 Container Oral TID BM  . levothyroxine  100 mcg Oral QAC breakfast  . [START ON 06/16/2013] multivitamin  1 tablet Oral QHS  . pantoprazole  40 mg Oral Q breakfast  . senna-docusate  2 tablet Oral QHS  . tuberculin  5 Units Intradermal Once  . warfarin  2.5 mg Oral ONCE-1800  . Warfarin - Pharmacist Dosing Inpatient   Does not apply q1800   Continuous Infusions:   Principal Problem:   Renal failure, acute on chronic Active Problems:   NSTEMI (non-ST elevated myocardial infarction)   S/P CABG x 4   Physical deconditioning   Acute on chronic renal failure   PAF (paroxysmal atrial fibrillation)   Anemia   Malnutrition of moderate degree    Time spent: 20 minutes.    [facility]  [facility] Pager [phone].   If 8PM-8AM, please contact night-coverage at [email or web address], password [facility] 06/15/2013, 6:03 PM  LOS: 3 days

## 2013-06-15 NOTE — Progress Notes (Signed)
Physical Therapy Treatment Patient Details Name: [name] MRN: [id number] DOB: 09/11/1934 Today's Date: 06/15/2013 Time: 1033-1050 PT Time Calculation (min): 17 min  PT Assessment / Plan / Recommendation  History of Present Illness [name] is a 79 y.o. male patient was transferred on 06/12/13 from inpatient rehabilitation to the hospital for continued inpatient hemodialysis needs. He has completed inpatient rehabilitation. Patient was hospitalized between 05/12/13-06/02/13 for non-STEMI, atrial flutter with variable conduction, EF 45%, ventilatory dependent respiratory failure & cardiogenic shock. Cardiac cath showed 90% distal LM and severe three-vessel coronary artery disease with RCA occlusion. He underwent emergent coronary artery bypass grafting. In the ICU, he developed acute renal failure. Nephrology was consulted and despite conservative measures, his renal functions continued to deteriorate. Dialysis was initiated. Coumadin was initiated for atrial fibrillation and history of DVT. 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 He was discharged and transferred to inpatient rehabilitation. Hospitalist service was requested to readmit patient from CIR for probable ESRD but cannot be clipped for 2.5 weeks. Patient no longer requires inpatient rehabilitation and has not been accepted at [facility]. Patient denies complaints and is anxious to go home.   PT Comments   Pt able to tolerate ambulating in hallway today with seated rest breaks as needed.  Follow Up Recommendations  Home health PT;Supervision/Assistance - 24 hour     Does the patient have the potential to tolerate intense rehabilitation     Barriers to Discharge        Equipment Recommendations  None recommended by PT    Recommendations for Other Services    Frequency     Progress towards PT Goals Progress towards PT goals: Progressing toward goals  Plan Current plan remains appropriate    Precautions /  Restrictions Precautions Precautions: Sternal;Fall Precaution Comments: orthostatic Required Braces or Orthoses: Other Brace/Splint Other Brace/Splint: abdominal binder Restrictions Weight Bearing Restrictions: No Other Position/Activity Restrictions: limited pushing/pulling of UEs due to sternal precautions   Pertinent Vitals/Pain n/a    Mobility  Bed Mobility Bed Mobility: Supine to Sit;Sit to Supine Supine to Sit: 5: Supervision;With rails Sit to Supine: 5: Supervision Details for Bed Mobility Assistance: Supervision for safety and to maintain sternal precautions, assisted pt donning abdominal binder Transfers Transfers: Sit to Stand;Stand to Sit Sit to Stand: 4: Min guard;From elevated surface;From bed;Without upper extremity assist;From chair/3-in-1 Stand to Sit: 4: Min guard;Without upper extremity assist;To bed;To chair/3-in-1 Details for Transfer Assistance: cues needed for hand placement  Ambulation/Gait Ambulation/Gait Assistance: 4: Min guard (due to dizziness and orthostatics) Ambulation Distance (Feet): 160 Feet Assistive device: Rolling walker Ambulation/Gait Assistance Details: pt required 3 seated rest breaks due to fatigue, pt states lightheadedness much better today Gait Pattern: Step-through pattern;Decreased stride length;Trunk flexed Gait velocity: decreased    Exercises     PT Diagnosis:    PT Problem List:   PT Treatment Interventions:     PT Goals (current goals can now be found in the care plan section)    Visit Information  Last PT Received On: 06/15/13 Assistance Needed: +1 History of Present Illness: [name] is a 79 y.o. male patient was transferred on 06/12/13 from inpatient rehabilitation to the hospital for continued inpatient hemodialysis needs. He has completed inpatient rehabilitation. Patient was hospitalized between 05/12/13-06/02/13 for non-STEMI, atrial flutter with variable conduction, EF 45%, ventilatory dependent respiratory  failure & cardiogenic shock. Cardiac cath showed 90% distal LM and severe three-vessel coronary artery disease with RCA occlusion. He underwent  emergent coronary artery bypass grafting. In the ICU, he developed acute renal failure. Nephrology was consulted and despite conservative measures, his renal functions continued to deteriorate. Dialysis was initiated. Coumadin was initiated for atrial fibrillation and history of DVT. 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 He was discharged and transferred to inpatient rehabilitation. Hospitalist service was requested to readmit patient from CIR for probable ESRD but cannot be clipped for 2.5 weeks. Patient no longer requires inpatient rehabilitation and has not been accepted at [facility]. Patient denies complaints and is anxious to go home.    Subjective Data      Cognition  Cognition Arousal/Alertness: Awake/alert Behavior During Therapy: WFL for tasks assessed/performed Overall Cognitive Status: Within Functional Limits for tasks assessed    Balance     End of Session PT - End of Session Equipment Utilized During Treatment: Gait belt;Other (comment) (abdominal binder and TED hose) Activity Tolerance: Patient tolerated treatment well;Patient limited by fatigue;Patient limited by pain Patient left: in bed;with call bell/phone within reach;with family/visitor present;with bed alarm set   GP     Cosandra Plouffe,KATHrine E 06/15/2013, 12:36 PM Zenovia Jarred, PT, DPT 06/15/2013 Pager: [phone]

## 2013-06-15 NOTE — Progress Notes (Signed)
[facility] ROUNDING NOTE   Subjective:   Interval History: no complaints but wants to go home  Objective:  Vital signs in last 24 hours:  Temp:  [97.7 F (36.5 C)-98.8 F (37.1 C)] 98.8 F (37.1 C) (08/11 0559) Pulse Rate:  [76-80] 77 (08/11 0559) Resp:  [18-20] 20 (08/11 0559) BP: (96-121)/(55-66) 106/64 mmHg (08/11 0559) SpO2:  [94 %-98 %] 94 % (08/11 0559) Weight:  [91.808 kg (202 lb 6.4 oz)] 91.808 kg (202 lb 6.4 oz) (08/10 2112)  Weight change: 0.508 kg (1 lb 1.9 oz) Filed Weights   06/13/13 0948 06/13/13 2201 06/14/13 2112  Weight: 91.3 kg (201 lb 4.5 oz) 91.6 kg (201 lb 15.1 oz) 91.808 kg (202 lb 6.4 oz)    Intake/Output: I/O last 3 completed shifts: In: 840 [P.O.:840] Out: -    Intake/Output this shift:     CVS- RRR RS- CTA ABD- BS present soft non-distended EXT- no edema   Basic Metabolic Panel:  Recent Labs Lab 06/09/13 1437 06/10/13 0642 06/11/13 0605 06/12/13 0609 06/13/13 0711  NA 135 137 138 139 137  K 4.9 4.2 4.1 4.0 3.7  CL 98 100 99 101 99  CO2 23 25 28 30 27   GLUCOSE 97 87 79 79 88  BUN 49* 23 34* 17 27*  CREATININE 9.90* 6.05* 8.37* 5.38* 7.70*  CALCIUM 8.5 8.4 8.4 8.2* 8.1*  PHOS 3.5 3.2 3.0 2.7 3.0    Liver Function Tests:  Recent Labs Lab 06/09/13 1437 06/10/13 0642 06/11/13 0605 06/12/13 0609 06/13/13 0711  ALBUMIN 2.2* 2.1* 2.2* 2.2* 2.3*   No results found for this basename: LIPASE, AMYLASE,  in the last 168 hours No results found for this basename: AMMONIA,  in the last 168 hours  CBC:  Recent Labs Lab 06/13/13 0711  WBC 8.8  HGB 9.5*  HCT 32.8*  MCV 89.4  PLT 270    Cardiac Enzymes: No results found for this basename: CKTOTAL, CKMB, CKMBINDEX, TROPONINI,  in the last 168 hours  BNP: No components found with this basename: POCBNP,   CBG:  Recent Labs Lab 06/14/13 0753 06/14/13 1146 06/14/13 1621 06/14/13 2110 06/15/13 0802  GLUCAP 94 81 102* 88 94    Microbiology: Results  for orders placed during the hospital encounter of 05/12/13  MRSA PCR SCREENING     Status: None   Collection Time    05/12/13  3:30 PM      Result Value Range Status   MRSA by PCR NEGATIVE  NEGATIVE Final   Comment:            The GeneXpert MRSA Assay (FDA     approved for NASAL specimens     only), is one component of a     comprehensive MRSA colonization     surveillance program. It is not     intended to diagnose MRSA     infection nor to guide or     monitor treatment for     MRSA infections.  SURGICAL PCR SCREEN     Status: Abnormal   Collection Time    05/13/13 12:02 PM      Result Value Range Status   MRSA, PCR NEGATIVE  NEGATIVE Final   Staphylococcus aureus POSITIVE (*) NEGATIVE Final   Comment:            The Xpert SA Assay (FDA     approved for NASAL specimens     in patients over 72 years of age),  is one component of     a comprehensive surveillance     program.  Test performance has     been validated by [facility] for patients greater     than or equal to 27 year old.     It is not intended     to diagnose infection nor to     guide or monitor treatment.  URINE CULTURE     Status: None   Collection Time    05/14/13  6:12 PM      Result Value Range Status   Specimen Description URINE, CATHETERIZED   Final   Special Requests NONE   Final   Culture  Setup Time 05/15/2013 00:42   Final   Colony Count NO GROWTH   Final   Culture NO GROWTH   Final   Report Status 05/15/2013 FINAL   Final  CULTURE, EXPECTORATED SPUTUM-ASSESSMENT     Status: None   Collection Time    05/23/13  2:58 PM      Result Value Range Status   Specimen Description SPUTUM   Final   Special Requests Normal   Final   Sputum evaluation     Final   Value: THIS SPECIMEN IS ACCEPTABLE. RESPIRATORY CULTURE REPORT TO FOLLOW.   Report Status 05/23/2013 FINAL   Final  CULTURE, RESPIRATORY (NON-EXPECTORATED)     Status: None   Collection Time    05/23/13  2:58 PM      Result Value  Range Status   Specimen Description SPUTUM   Final   Special Requests NONE   Final   Gram Stain     Final   Value: RARE WBC PRESENT, PREDOMINANTLY PMN     FEW SQUAMOUS EPITHELIAL CELLS PRESENT     MODERATE GRAM NEGATIVE RODS   Culture MODERATE ENTEROBACTER AEROGENES   Final   Report Status 05/25/2013 FINAL   Final   Organism ID, Bacteria ENTEROBACTER AEROGENES   Final    Coagulation Studies:  Recent Labs  06/13/13 1042 06/14/13 0510 06/15/13 0515  LABPROT 23.7* 22.2* 23.5*  INR 2.20* 2.02* 2.17*    Urinalysis: No results found for this basename: COLORURINE, APPERANCEUR, LABSPEC, PHURINE, GLUCOSEU, HGBUR, BILIRUBINUR, KETONESUR, PROTEINUR, UROBILINOGEN, NITRITE, LEUKOCYTESUR,  in the last 72 hours    Imaging: No results found.   Medications:     . atorvastatin  20 mg Oral q1800  . calcium acetate  667 mg Oral TID WC  . [START ON 06/16/2013] darbepoetin  60 mcg Subcutaneous Q Tue-1800  . feeding supplement  1 Container Oral TID BM  . levothyroxine  100 mcg Oral QAC breakfast  . multivitamin  1 tablet Oral Daily  . pantoprazole  40 mg Oral Q breakfast  . senna-docusate  2 tablet Oral QHS  . Warfarin - Pharmacist Dosing Inpatient   Does not apply q1800   acetaminophen, albuterol, diphenhydrAMINE, HYDROcodone-acetaminophen, ondansetron (ZOFRAN) IV, ondansetron, traZODone  Assessment/ Plan:  [name] is a 79 y.o. male patient was transferred on 06/12/13 from inpatient rehabilitation to the hospital for continued inpatient hemodialysis needs. Initiated dialysis 05/21/13. Patient was hospitalized for non-STEMI, atrial flutter with variable conduction, EF 45%, ventilatory dependent respiratory failure & cardiogenic shock.Coumadin was initiated for atrial fibrillation and history of DVT       ESRD- almost no recovery in 3 weeks I suspect that this is permanent. CLIP in process for [facility]  ANEMIA- Hb 9.5 Darb. 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

## 2013-06-15 NOTE — Progress Notes (Signed)
PPD skin test placed to the left posterior forearm at 3pm by this RN.  Patient given a yellow sticker to take to his outpatient dialysis center where his test will be read.  This RN explained to the patient he must have this documentation when he goes to his outpatient dialysis center.  Patient verbalizes understanding.

## 2013-06-15 NOTE — Progress Notes (Signed)
Entered in Error

## 2013-06-15 NOTE — Progress Notes (Signed)
Utilization review completed.  

## 2013-06-16 LAB — GLUCOSE, CAPILLARY
Glucose-Capillary: 73 mg/dL (ref 70–99)
Glucose-Capillary: 97 mg/dL (ref 70–99)

## 2013-06-16 LAB — CBC
HCT: 32.7 % — ABNORMAL LOW (ref 39.0–52.0)
Hemoglobin: 10.1 g/dL — ABNORMAL LOW (ref 13.0–17.0)
MCHC: 30.9 g/dL (ref 30.0–36.0)
RBC: 3.73 MIL/uL — ABNORMAL LOW (ref 4.22–5.81)
WBC: 10.2 10*3/uL (ref 4.0–10.5)

## 2013-06-16 LAB — RENAL FUNCTION PANEL
Albumin: 2.3 g/dL — ABNORMAL LOW (ref 3.5–5.2)
BUN: 54 mg/dL — ABNORMAL HIGH (ref 6–23)
Chloride: 97 mEq/L (ref 96–112)
GFR calc Af Amer: 5 mL/min — ABNORMAL LOW (ref >90)
GFR calc non Af Amer: 4 mL/min — ABNORMAL LOW (ref >90)
Phosphorus: 3.5 mg/dL (ref 2.3–4.6)
Potassium: 4.4 mEq/L (ref 3.5–5.1)
Sodium: 136 mEq/L (ref 135–145)

## 2013-06-16 LAB — PROTIME-INR
INR: 2.04 — ABNORMAL HIGH (ref 0.00–1.49)
Prothrombin Time: 22.4 seconds — ABNORMAL HIGH (ref 11.6–15.2)

## 2013-06-16 MED ORDER — CALCIUM ACETATE 667 MG PO CAPS: 667.0000 mg | ORAL_CAPSULE | Freq: Three times a day (TID) | ORAL | Status: DC

## 2013-06-16 MED ORDER — WARFARIN SODIUM 2.5 MG PO TABS: 2.5000 mg | ORAL_TABLET | Freq: Every day | ORAL | Status: DC

## 2013-06-16 MED ORDER — LEVOTHYROXINE SODIUM 100 MCG PO TABS: 100.0000 ug | ORAL_TABLET | Freq: Every day | ORAL | Status: DC

## 2013-06-16 MED ORDER — DARBEPOETIN ALFA-POLYSORBATE 60 MCG/0.3ML IJ SOLN
60.0000 ug | INTRAMUSCULAR | Status: DC
  Administered 2013-06-16: 60 ug via INTRAVENOUS
  Filled 2013-06-16: qty 0.3

## 2013-06-16 MED ORDER — ACETAMINOPHEN 325 MG PO TABS: 325.0000 mg | ORAL_TABLET | Freq: Four times a day (QID) | ORAL | Status: DC | PRN

## 2013-06-16 MED ORDER — WARFARIN SODIUM 2.5 MG PO TABS
2.5000 mg | ORAL_TABLET | Freq: Once | ORAL | Status: DC
  Filled 2013-06-16: qty 1

## 2013-06-16 MED ORDER — ONDANSETRON HCL 4 MG PO TABS: 4.0000 mg | ORAL_TABLET | Freq: Three times a day (TID) | ORAL | Status: DC | PRN

## 2013-06-16 MED ORDER — ACETAMINOPHEN 325 MG PO TABS: 325.0000 mg | ORAL_TABLET | Freq: Four times a day (QID) | ORAL | Status: AC | PRN

## 2013-06-16 MED ORDER — BOOST / RESOURCE BREEZE PO LIQD: 1.0000 | Freq: Three times a day (TID) | ORAL | Status: DC

## 2013-06-16 MED ORDER — ATORVASTATIN CALCIUM 20 MG PO TABS: 20.0000 mg | ORAL_TABLET | Freq: Every day | ORAL | Status: DC

## 2013-06-16 MED ORDER — RENA-VITE PO TABS: 1.0000 | ORAL_TABLET | Freq: Every day | ORAL | Status: DC

## 2013-06-16 MED ORDER — PANTOPRAZOLE SODIUM 40 MG PO TBEC: 40.0000 mg | DELAYED_RELEASE_TABLET | Freq: Every day | ORAL | Status: DC

## 2013-06-16 NOTE — Progress Notes (Signed)
ANTICOAGULATION CONSULT NOTE - Follow Up Consult  Pharmacy Consult for Coumadin Indication: afib and hx of DVT  Allergies  Allergen Reactions  . 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   Medications:  Scheduled:  . atorvastatin  20 mg Oral q1800  . calcium acetate  667 mg Oral TID WC  . darbepoetin  60 mcg Intravenous Q Tue-1800  . feeding supplement  1 Container Oral TID BM  . levothyroxine  100 mcg Oral QAC breakfast  . multivitamin  1 tablet Oral QHS  . pantoprazole  40 mg Oral Q breakfast  . senna-docusate  2 tablet Oral QHS  . tuberculin  5 Units Intradermal Once  . Warfarin - Pharmacist Dosing Inpatient   Does not apply q1800    Assessment: 79 yo male transferred back from rehab for inpatient HD needs. He continues on coumadin for AFib and history of DVT. No signs and/or symptoms of bleeding are noted. INR therapeutic at 2.04.    At this time based on trends, if patient were to discharge, would recommend a home dose of Coumadin 2.5mg  PO daily with follow up INR monitoring in the outpatient setting.  Goal of Therapy:  INR 2-3 Monitor platelets by anticoagulation protocol: Yes   Plan:  - Coumadin PO 2.5mg  x1 dose tonight  - continue daily INR  - monitor for s/s of bleeding - If discharged today, recommend a home regimen of Coumadin 2.5mg  PO daily with follow up INR monitoring in the  outpatient setting  Arvon Schreiner E. Achilles Dunk, PharmD Clinical Pharmacist - Resident Pager: [phone] Pharmacy: [phone] 06/16/2013 1:43 PM

## 2013-06-16 NOTE — Discharge Summary (Signed)
Physician Discharge Summary  [name] NFA:[id number] DOB: [birth year] DOA: 06/12/2013  PCP: Toma Deiters, MD  Admit date: 06/12/2013 Discharge date: 06/16/2013  Time spent: Greater than 30 minutes  Recommendations for Outpatient Follow-up:  1. Prescott Parma, PA-C, [facility], [place] on 07/03/13 at 3:30 PM. 2. PT & INR to be drawn during next hemodialysis on 06/18/13. Results are to be forwarded to Prescott Parma, PA-C [facility], [facility] for Coumadin dose management. Same message was relayed to nephrology PA prior to discharge. 3. Dr. Rexanne Mano, [facility] on 06/24/13 at 12:30 PM 4. PPD to be read at [facility] on 06/17/2013 5. [facility]: keep scheduled dialysis appointments on TTS. 6. Home health physical therapy.  Discharge Diagnoses:  Principal Problem:   Renal failure, acute on chronic Active Problems:   NSTEMI (non-ST elevated myocardial infarction)   S/P CABG x 4   Physical deconditioning   Acute on chronic renal failure   PAF (paroxysmal atrial fibrillation)   Anemia   Malnutrition of moderate degree   ESRD on dialysis   Discharge Condition: Improved & Stable  Diet recommendation: Heart healthy diet.  Filed Weights   06/14/13 2112 06/15/13 2200 06/16/13 4696  Weight: 91.808 kg (202 lb 6.4 oz) 93.078 kg (205 lb 3.2 oz) 92.1 kg (203 lb 0.7 oz)    History of present illness:  79 y.o. male patient was transferred on 06/12/13 from inpatient rehabilitation to the hospital for continued inpatient hemodialysis needs. He has completed inpatient rehabilitation. Patient was hospitalized between 05/12/13-06/02/13 for non-STEMI, atrial flutter with variable conduction, EF 45%, ventilatory dependent respiratory failure & cardiogenic shock. Cardiac cath showed 90% distal LM and severe three-vessel coronary artery disease with RCA occlusion. He underwent emergent coronary artery bypass grafting. In the ICU, he developed acute renal failure. Nephrology  was consulted and despite conservative measures, his renal functions continued to deteriorate. Dialysis was initiated. Coumadin was initiated for atrial fibrillation and history of DVT. 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 He was discharged and transferred to inpatient rehabilitation. Hospitalist service was requested to readmit patient from CIR for probable ESRD but cannot be clipped for 2.5 weeks. Patient no longer requires inpatient rehabilitation and has not been accepted at [facility].  Hospital Course:   Acute on chronic kidney disease/ New ESRD on HD - Possibly precipitated by cardiogenic shock during prior admission.  - Nephrology consulted - Continued on TTS dialysis schedule.  - Per nephrology, no recovery in 3 weeks & suspect that this is permanent  - CLIP done 8/11  - Nephrology has arranged for outpatient hemodialysis at [facility]. PPD was placed on 8/11 and has to be read on 8/13 - Patient cleared for discharge home by nephrology.  Anemia  - Secondary to chronic kidney disease.  - Stable.   Paroxysmal A. fib/history of DVT  - Anticoagulated. Rate controlled. INR 2.04  - As per pharmacy recommendations, will discharge patient on Coumadin 2.5 mg daily. - Arranged with nephrology and cardiology: Patient will have PT and INR checked at hemodialysis on 06/18/13 and results can be forwarded to the [facility] cardiology office in [facility] for dose adjustment.  Recent non-STEMI/status post CABG  - Stable  - Outpatient followup with CTS and cardiology.   Hypertension  - Controlled   Hypoglycemia  SSI discontinued-A1c only 4.9. Not diabetic. Had a hypoglycemic episode on 8/9 after stopping SSI. Continue checking CBG - no further hypoglycemic episodes  Procedures:  Continued hemodialysis.  Consultations:  Nephrology.  Discharge Exam:  Complaints: Denies complaints. Anxious to go home. Seen at dialysis.  Filed Vitals:   06/16/13 0930 06/16/13 0937  06/16/13 1002 06/16/13 1326  BP: 94/57 108/55 98/56 108/83  Pulse: 90 88 94 93  Temp: 97.4 F (36.3 C)  98 F (36.7 C) 97.7 F (36.5 C)  TempSrc: Oral  Oral Oral  Resp: 18  18 18   Height:      Weight:      SpO2:   100% 100%     General exam: Comfortable.  Respiratory system: Clear. No increased work of breathing.  Cardiovascular system: S1 and S2 heard, RRR. No JVD, murmurs or pedal edema.  Gastrointestinal system: Abdomen is nondistended, soft and nontender. Normal bowel sounds heard.  Central nervous system: Alert and oriented. No focal neurological deficits.  Extremities: Symmetric 5 x 5 power.  Discharge Instructions      Discharge Orders   Future Appointments Provider Department Dept Phone   06/24/2013 12:30 PM Alleen Borne, MD [facility] [phone]   07/03/2013 3:00 PM Prescott Parma, PA-C [facility] [place] [phone]   Future Orders Complete By Expires     Call MD for:  difficulty breathing, headache or visual disturbances  As directed     Call MD for:  extreme fatigue  As directed     Call MD for:  persistant dizziness or light-headedness  As directed     Call MD for:  persistant nausea and vomiting  As directed     Call MD for:  redness, tenderness, or signs of infection (pain, swelling, redness, odor or green/yellow discharge around incision site)  As directed     Call MD for:  severe uncontrolled pain  As directed     Call MD for:  temperature >100.4  As directed     Diet - low sodium heart healthy  As directed     Increase activity slowly  As directed         Medication List    STOP taking these medications       albuterol (5 MG/ML) 0.5% nebulizer solution  Commonly known as:  PROVENTIL     camphor-menthol lotion  Commonly known as:  SARNA     darbepoetin 60 MCG/0.3ML Soln injection  Commonly known as:  ARANESP     diphenhydrAMINE 12.5 MG/5ML elixir  Commonly known as:  BENADRYL      guaiFENesin-dextromethorphan 100-10 MG/5ML syrup  Commonly known as:  ROBITUSSIN DM     HYDROcodone-acetaminophen 5-325 MG per tablet  Commonly known as:  NORCO/VICODIN     insulin aspart 100 UNIT/ML injection  Commonly known as:  novoLOG     promethazine 12.5 MG tablet  Commonly known as:  PHENERGAN     senna-docusate 8.6-50 MG per tablet  Commonly known as:  Senokot-S     sodium chloride 0.9 % SOLN 100 mL with ferric gluconate 12.5 MG/ML SOLN 125 mg     sorbitol 70 % Soln     traZODone 25 mg Tabs tablet  Commonly known as:  DESYREL      TAKE these medications       acetaminophen 325 MG tablet  Commonly known as:  TYLENOL  Take 1-2 tablets (325-650 mg total) by mouth every 6 (six) hours as needed for pain.     atorvastatin 20 MG tablet  Commonly known as:  LIPITOR  Take 1 tablet (20 mg total) by mouth daily at  6 PM.     calcium acetate 667 MG capsule  Commonly known as:  PHOSLO  Take 1 capsule (667 mg total) by mouth 3 (three) times daily with meals.     feeding supplement Liqd  Take 1 Container by mouth 3 (three) times daily between meals.     levothyroxine 100 MCG tablet  Commonly known as:  SYNTHROID, LEVOTHROID  Take 1 tablet (100 mcg total) by mouth daily before breakfast.     multivitamin Tabs tablet  Take 1 tablet by mouth daily.     ondansetron 4 MG tablet  Commonly known as:  ZOFRAN  Take 1 tablet (4 mg total) by mouth every 8 (eight) hours as needed for nausea.     pantoprazole 40 MG tablet  Commonly known as:  PROTONIX  Take 1 tablet (40 mg total) by mouth daily.     warfarin 2.5 MG tablet  Commonly known as:  COUMADIN  Take 1 tablet (2.5 mg total) by mouth daily at 6 PM.       Follow-up Information   Follow up with Prescott Parma, PA-C On 07/03/2013. (3:30 PM)    Contact information:   [address], [address] [place] [address] [phone]       Follow up with [facility] [place], [place]. (Continue dialysis on TTS. PT & INR to be  drawn at next dialysis on 06/18/13 and results to be forwarded to Niel Hummer, [facility])       Follow up with Alleen Borne, MD On 06/24/2013. (at 12:30 PM.)    Contact information:   [address] [place] [address] [phone]        The results of significant diagnostics from this hospitalization (including imaging, microbiology, ancillary and laboratory) are listed below for reference.    Significant Diagnostic Studies:  None during this admission.  Microbiology: No results found for this or any previous visit (from the past 240 hour(s)).   Labs: Basic Metabolic Panel:  Recent Labs Lab 06/10/13 0642 06/11/13 0605 06/12/13 0609 06/13/13 0711 06/16/13 0633  NA 137 138 139 137 136  K 4.2 4.1 4.0 3.7 4.4  CL 100 99 101 99 97  CO2 25 28 30 27 26   GLUCOSE 87 79 79 88 81  BUN 23 34* 17 27* 54*  CREATININE 6.05* 8.37* 5.38* 7.70* 10.98*  CALCIUM 8.4 8.4 8.2* 8.1* 8.3*  PHOS 3.2 3.0 2.7 3.0 3.5   Liver Function Tests:  Recent Labs Lab 06/10/13 5784 06/11/13 0605 06/12/13 0609 06/13/13 0711 06/16/13 0633  ALBUMIN 2.1* 2.2* 2.2* 2.3* 2.3*   No results found for this basename: LIPASE, AMYLASE,  in the last 168 hours No results found for this basename: AMMONIA,  in the last 168 hours CBC:  Recent Labs Lab 06/13/13 0711 06/16/13 0515  WBC 8.8 10.2  HGB 9.5* 10.1*  HCT 32.8* 32.7*  MCV 89.4 87.7  PLT 270 236   Cardiac Enzymes: No results found for this basename: CKTOTAL, CKMB, CKMBINDEX, TROPONINI,  in the last 168 hours BNP: BNP (last 3 results) No results found for this basename: PROBNP,  in the last 8760 hours CBG:  Recent Labs Lab 06/15/13 1213 06/15/13 1646 06/15/13 2117 06/16/13 1000 06/16/13 1220  GLUCAP 86 106* 92 73 97    Additional labs:    Signed:  Christobal Morado  [facility] 06/16/2013, 3:28 PM

## 2013-06-16 NOTE — Procedures (Signed)
I have seen and examined this patient and agree with the plan of care. Patient hopefully home today. Dialysis in [place]. Scheduled aranesp this AM. Elvis Coil W 06/16/2013, 7:20 AM

## 2013-06-16 NOTE — Care Management Note (Signed)
   CARE MANAGEMENT NOTE 06/16/2013  Patient:  JUDITH, CAMPILLO   Account Number:  [id number]  Date Initiated:  06/15/2013  Documentation initiated by:  Darlyne Russian  Subjective/Objective Assessment:   Patient admitted with ARF. New start HD on 05/22/2103.     Action/Plan:   Progression of care and discharge planning   Anticipated DC Date:     Anticipated DC Plan:  HOME W HOME HEALTH SERVICES      DC Planning Services  CM consult      Choice offered to / List presented to:          [facility] arranged  HH-2 PT      [facility] agency  [facility]   Status of service:  Completed, signed off Medicare Important Message given?   (If response is "NO", the following Medicare IM given date fields will be blank) Date Medicare IM given:   Date Additional Medicare IM given:    Discharge Disposition:  HOME W HOME HEALTH SERVICES  Per UR Regulation:    If discussed at Long Length of Stay Meetings, dates discussed:    Comments:   06/16/2013  Pt for d/c today, [facility] to provide HHPT orders and face to face placed by MD. Johny Shock RN MPH Case Manager [phone]   06/15/2013  [address]  [place], [place]  [phone] Met with patient and family to discuss home health services if MD orders at time of discharge. In the past had used [facility] and would use them.  [facility]/Marie: called, patient may discharge home tomorrow and may need home PT, no orders yet.

## 2013-06-17 NOTE — Progress Notes (Signed)
Speech Language Pathology Discharge Summary  Patient Details  Name: [name] MRN: [id number] Date of Birth: Feb 28, 1934  Today's Date: 06/17/2013  Patient has met 5 of 5 long term goals.  Patient to discharge at overall Min;Mod level.  Reasons goals not met:   N/A  Clinical Impression/Discharge Summary:   Pt has achieved 5/5 LTGs for speech therapy during his time in CIR. He used external aids as needed for recall of new information with Supervision level cues, increased sustained attention, and demonstrated basic problem solving with Mod cues. Pt has met all of his speech therapy goals and has made gains in functional independence. No further skilled SLP intervention to be provided at this level of care.  Care Partner:  Caregiver Able to Provide Assistance: Yes  Type of Caregiver Assistance: Cognitive  Recommendation:  24 hour supervision/assistance      Equipment: None recommended by SLP   Reasons for discharge: Treatment goals met   Patient/Family Agrees with Progress Made and Goals Achieved: Yes   See FIM for current functional status  Maxcine Ham 06/17/2013, 1:31 PM  Maxcine Ham, M.A. CCC-SLP

## 2013-06-19 ENCOUNTER — Telehealth: Payer: Self-pay

## 2013-06-19 ENCOUNTER — Ambulatory Visit (INDEPENDENT_AMBULATORY_CARE_PROVIDER_SITE_OTHER): Payer: Medicare Other | Admitting: *Deleted

## 2013-06-19 DIAGNOSIS — Z951 Presence of aortocoronary bypass graft: Secondary | ICD-10-CM

## 2013-06-19 DIAGNOSIS — I480 Paroxysmal atrial fibrillation: Secondary | ICD-10-CM

## 2013-06-19 DIAGNOSIS — Z7901 Long term (current) use of anticoagulants: Secondary | ICD-10-CM

## 2013-06-19 DIAGNOSIS — I4891 Unspecified atrial fibrillation: Secondary | ICD-10-CM

## 2013-06-19 LAB — POCT INR: INR: 2.7

## 2013-06-19 NOTE — Progress Notes (Signed)
Note reviewed and accurately reflects treatment session.   

## 2013-06-22 ENCOUNTER — Telehealth: Payer: Self-pay | Admitting: *Deleted

## 2013-06-22 ENCOUNTER — Other Ambulatory Visit: Payer: Self-pay | Admitting: *Deleted

## 2013-06-22 ENCOUNTER — Ambulatory Visit (INDEPENDENT_AMBULATORY_CARE_PROVIDER_SITE_OTHER): Payer: Medicare Other | Admitting: *Deleted

## 2013-06-22 DIAGNOSIS — Z951 Presence of aortocoronary bypass graft: Secondary | ICD-10-CM

## 2013-06-22 DIAGNOSIS — Z7901 Long term (current) use of anticoagulants: Secondary | ICD-10-CM

## 2013-06-22 DIAGNOSIS — I480 Paroxysmal atrial fibrillation: Secondary | ICD-10-CM

## 2013-06-22 DIAGNOSIS — I4891 Unspecified atrial fibrillation: Secondary | ICD-10-CM

## 2013-06-22 DIAGNOSIS — I2510 Atherosclerotic heart disease of native coronary artery without angina pectoris: Secondary | ICD-10-CM

## 2013-06-22 LAB — POCT INR: INR: 4

## 2013-06-24 ENCOUNTER — Ambulatory Visit (INDEPENDENT_AMBULATORY_CARE_PROVIDER_SITE_OTHER): Payer: Self-pay | Admitting: Surgery

## 2013-06-24 ENCOUNTER — Emergency Department (HOSPITAL_COMMUNITY)
Admission: EM | Admit: 2013-06-24 | Discharge: 2013-06-24 | Disposition: A | Discharge status: Home / Self Care | Payer: Medicare Other | Attending: Emergency Medicine | Admitting: Emergency Medicine

## 2013-06-24 ENCOUNTER — Other Ambulatory Visit: Payer: Self-pay | Admitting: *Deleted

## 2013-06-24 ENCOUNTER — Ambulatory Visit
Admission: RE | Admit: 2013-06-24 | Discharge: 2013-06-24 | Disposition: A | Discharge status: Home / Self Care | Payer: Medicare Other | Source: Ambulatory Visit | Attending: Surgery | Admitting: Surgery

## 2013-06-24 ENCOUNTER — Encounter (HOSPITAL_COMMUNITY): Payer: Self-pay | Admitting: Emergency Medicine

## 2013-06-24 ENCOUNTER — Encounter: Payer: Self-pay | Admitting: Surgery

## 2013-06-24 VITALS — BP 86/62 | HR 133 | Resp 16 | Ht 73.0 in | Wt 198.0 lb

## 2013-06-24 DIAGNOSIS — R110 Nausea: Secondary | ICD-10-CM | POA: Insufficient documentation

## 2013-06-24 DIAGNOSIS — E860 Dehydration: Secondary | ICD-10-CM | POA: Insufficient documentation

## 2013-06-24 DIAGNOSIS — I252 Old myocardial infarction: Secondary | ICD-10-CM | POA: Insufficient documentation

## 2013-06-24 DIAGNOSIS — H9190 Unspecified hearing loss, unspecified ear: Secondary | ICD-10-CM | POA: Insufficient documentation

## 2013-06-24 DIAGNOSIS — Z951 Presence of aortocoronary bypass graft: Secondary | ICD-10-CM | POA: Insufficient documentation

## 2013-06-24 DIAGNOSIS — R000 Tachycardia, unspecified: Secondary | ICD-10-CM | POA: Insufficient documentation

## 2013-06-24 DIAGNOSIS — I4891 Unspecified atrial fibrillation: Secondary | ICD-10-CM

## 2013-06-24 DIAGNOSIS — F329 Major depressive disorder, single episode, unspecified: Secondary | ICD-10-CM | POA: Insufficient documentation

## 2013-06-24 DIAGNOSIS — I129 Hypertensive chronic kidney disease with stage 1 through stage 4 chronic kidney disease, or unspecified chronic kidney disease: Secondary | ICD-10-CM | POA: Insufficient documentation

## 2013-06-24 DIAGNOSIS — F3289 Other specified depressive episodes: Secondary | ICD-10-CM | POA: Insufficient documentation

## 2013-06-24 DIAGNOSIS — Z8739 Personal history of other diseases of the musculoskeletal system and connective tissue: Secondary | ICD-10-CM | POA: Insufficient documentation

## 2013-06-24 DIAGNOSIS — Z79899 Other long term (current) drug therapy: Secondary | ICD-10-CM | POA: Insufficient documentation

## 2013-06-24 DIAGNOSIS — N189 Chronic kidney disease, unspecified: Secondary | ICD-10-CM | POA: Insufficient documentation

## 2013-06-24 DIAGNOSIS — R51 Headache: Secondary | ICD-10-CM | POA: Insufficient documentation

## 2013-06-24 DIAGNOSIS — R002 Palpitations: Secondary | ICD-10-CM | POA: Insufficient documentation

## 2013-06-24 DIAGNOSIS — I2510 Atherosclerotic heart disease of native coronary artery without angina pectoris: Secondary | ICD-10-CM

## 2013-06-24 DIAGNOSIS — Z859 Personal history of malignant neoplasm, unspecified: Secondary | ICD-10-CM | POA: Insufficient documentation

## 2013-06-24 DIAGNOSIS — Z8679 Personal history of other diseases of the circulatory system: Secondary | ICD-10-CM | POA: Insufficient documentation

## 2013-06-24 DIAGNOSIS — Z992 Dependence on renal dialysis: Secondary | ICD-10-CM | POA: Insufficient documentation

## 2013-06-24 DIAGNOSIS — R609 Edema, unspecified: Secondary | ICD-10-CM | POA: Insufficient documentation

## 2013-06-24 LAB — CBC
Hemoglobin: 12.6 g/dL — ABNORMAL LOW (ref 13.0–17.0)
MCH: 27.3 pg (ref 26.0–34.0)
MCHC: 31.3 g/dL (ref 30.0–36.0)
Platelets: 275 10*3/uL (ref 150–400)
RDW: 19.1 % — ABNORMAL HIGH (ref 11.5–15.5)

## 2013-06-24 LAB — POCT I-STAT TROPONIN I: Troponin i, poc: 0 ng/mL (ref 0.00–0.08)

## 2013-06-24 LAB — COMPREHENSIVE METABOLIC PANEL
ALT: 31 U/L (ref 0–53)
Albumin: 2.9 g/dL — ABNORMAL LOW (ref 3.5–5.2)
Alkaline Phosphatase: 131 U/L — ABNORMAL HIGH (ref 39–117)
Calcium: 8.9 mg/dL (ref 8.4–10.5)
GFR calc Af Amer: 9 mL/min — ABNORMAL LOW (ref >90)
Glucose, Bld: 110 mg/dL — ABNORMAL HIGH (ref 70–99)
Potassium: 4.4 mEq/L (ref 3.5–5.1)
Sodium: 135 mEq/L (ref 135–145)
Total Protein: 7.6 g/dL (ref 6.0–8.3)

## 2013-06-24 MED ORDER — SODIUM CHLORIDE 0.9 % IV BOLUS (SEPSIS)
250.0000 mL | Freq: Once | INTRAVENOUS | Status: AC
  Administered 2013-06-24: 250 mL via INTRAVENOUS

## 2013-06-24 NOTE — ED Notes (Signed)
stARES HAS BEEN SICK TO STOMACH AND FELT HEART RACING FOR A FEW DAYS HAD DIALYSIS YESTERDAY STATES SINCE HE WOKE UP FROM OPERATION  CANNOT sleep eat

## 2013-06-24 NOTE — ED Provider Notes (Signed)
CSN: [id number]     Arrival date & time 06/24/13  1226 History     First MD Initiated Contact with Patient 06/24/13 1311     Chief Complaint  Patient presents with  . Chest Pain   (Consider location/radiation/quality/duration/timing/severity/associated sxs/prior Treatment) HPI ThIs a 79 year old with a history of recent NSTEMI and CABG and chronic renal failure who presents with palpitations.  The patient states that he felt like he had an irregular heart rate earlier today. He endorses nausea since he had his surgery. He also endorses a headache. He denies any chest pain or shortness of breath. He had dialysis yesterday and felt like he was given a pass out. He was told that his blood pressure was low at that time.  He endorses continued nausea since discharge.  Patient was seen by his cardiac surgeon today and found to be in atrial fibrillation with a low blood pressure. Past Medical History  Diagnosis Date  . Cancer   . Hypertension   . Collagen vascular disease   . Anginal pain   . Depression   . Arthritis   . Peripheral edema     chronic/wears TEDs  . 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 Renal disorder   . Dialysis patient    Past Surgical History  Procedure Laterality Date  . Eye surgery    . Coronary artery bypass graft N/A 05/13/2013    Procedure: CORONARY ARTERY BYPASS GRAFTING (CABG);  Surgeon: Alleen Borne, MD;  Location[facility] OR;  Service: Open Heart Surgery;  Laterality: N/A;  . Insertion of dialysis catheter N/A 05/26/2013    Procedure: INSERTION OF DIALYSIS CATHETER;  Surgeon: Sherren Kerns, MD;  Location: [facility] OR;  Service: Vascular;  Laterality: N/A;  right IJ  . Cardiac surgery     No family history on file. History  Substance Use Topics  . Smoking status: Never Smoker   . Smokeless tobacco: Not on file  . Alcohol Use: No    Review of Systems  Constitutional: Negative.  Negative for fever.  Respiratory: Negative.  Negative for chest tightness and  shortness of breath.   Cardiovascular: Positive for palpitations. Negative for chest pain.  Gastrointestinal: Positive for nausea. Negative for abdominal pain.  Genitourinary: Negative.  Negative for dysuria.  Musculoskeletal: Negative for back pain.  Skin: Negative for pallor.  Neurological: Positive for headaches.  All other systems reviewed and are negative.    Allergies  Heparin  Home Medications   Current Outpatient Rx  Name  Route  Sig  Dispense  Refill  . acetaminophen (TYLENOL) 325 MG tablet   Oral   Take 1-2 tablets (325-650 mg total) by mouth every 6 (six) hours as needed for pain.         [name] atorvastatin (LIPITOR) 20 MG tablet   Oral   Take 1 tablet (20 mg total) by mouth daily at 6 PM.   30 tablet   0   . calcium acetate (PHOSLO) 667 MG capsule   Oral   Take 1 capsule (667 mg total) by mouth 3 (three) times daily with meals.   90 capsule   0   . diltiazem (TIAZAC) 360 MG 24 hr capsule   Oral   Take 360 mg by mouth daily.         . feeding supplement (RESOURCE BREEZE) LIQD   Oral   Take 1 Container by mouth 3 (three) times daily between meals.         [name]  levothyroxine (SYNTHROID, LEVOTHROID) 100 MCG tablet   Oral   Take 1 tablet (100 mcg total) by mouth daily before breakfast.   30 tablet   0   . meclizine (ANTIVERT) 25 MG tablet   Oral   Take 25 mg by mouth 3 (three) times daily.         . multivitamin (RENA-VIT) TABS tablet   Oral   Take 1 tablet by mouth daily.         . ondansetron (ZOFRAN) 4 MG tablet   Oral   Take 1 tablet (4 mg total) by mouth every 8 (eight) hours as needed for nausea.   20 tablet   0   . pantoprazole (PROTONIX) 40 MG tablet   Oral   Take 1 tablet (40 mg total) by mouth daily.   30 tablet   0   . warfarin (COUMADIN) 2.5 MG tablet   Oral   Take 1 tablet (2.5 mg total) by mouth daily at 6 PM.   30 tablet   0    BP 119/76  Pulse 121  Temp(Src) 98.6 F (37 C) (Oral)  Resp 21  SpO2 96% Physical  Exam  Nursing note and vitals reviewed. Constitutional: He is oriented to person, place, and time. He appears well-developed and well-nourished.  HENT:  Head: Normocephalic and atraumatic.  Eyes: Pupils are equal, round, and reactive to light.  Neck: Neck supple.  Cardiovascular: Regular rhythm and normal heart sounds.  Tachycardia present.   No murmur heard. Well healed midline sternal scar  Pulmonary/Chest: Effort normal and breath sounds normal. No respiratory distress. He has no wheezes.  Abdominal: Soft. Bowel sounds are normal. There is no tenderness. There is no rebound.  Musculoskeletal: He exhibits edema.  Ted hose in place  Lymphadenopathy:    He has no cervical adenopathy.  Neurological: He is alert and oriented to person, place, and time.  Skin: Skin is warm and dry.  Psychiatric: He has a normal mood and affect.    ED Course   Date: 06/24/2013  Rate: 130  Rhythm: sinus vs atrial tachycardia  QRS Axis: normal  Intervals: normal  ST/T Wave abnormalities: normal  Conduction Disutrbances:none  Narrative Interpretation: Sinus vs atrial tachycardia, hard to view p waves, regular  Old EKG Reviewed: changes noted   Procedures (including critical care time)  Labs Reviewed  CBC - Abnormal; Notable for the following:    WBC 12.1 (*)    Hemoglobin 12.6 (*)    RDW 19.1 (*)    All other components within normal limits  COMPREHENSIVE METABOLIC PANEL - Abnormal; Notable for the following:    Chloride 94 (*)    Glucose, Bld 110 (*)    Creatinine, Ser 6.07 (*)    Albumin 2.9 (*)    AST 46 (*)    Alkaline Phosphatase 131 (*)    GFR calc non Af Amer 8 (*)    GFR calc Af Amer 9 (*)    All other components within normal limits  PROTIME-INR - Abnormal; Notable for the following:    Prothrombin Time 28.6 (*)    INR 2.81 (*)    All other components within normal limits  URINALYSIS, ROUTINE W REFLEX MICROSCOPIC  TSH  POCT I-STAT TROPONIN I   Dg Chest 2 View  06/24/2013    *RADIOLOGY REPORT*  Clinical Data: History of CABG, some chest pain and nausea  CHEST - 2 VIEW  Comparison: Chest x-ray of 05/31/2013  Findings: Only mild basilar atelectasis  and/or scarring remains. No effusion is seen.  Cardiomegaly is stable.  A right central venous line is unchanged in position.  Median sternotomy sutures are noted from prior CABG.  IMPRESSION: Mild bibasilar linear atelectasis and/or scarring.  Stable cardiomegaly.   Original Report Authenticated By: Dwyane Dee, M.D.   1. Palpitations   2. Atrial fibrillation   3. Dehydration     MDM  This is a 79 yo s/p CABG who presents with palpitations and atrial flutter from his surgeons office.  Patient is nontoxic.  Initial BP 119/79.  Given reports of feeling presyncopal at dialysis, I suspect the patient may be dry.  Patient given 250cc of fluids given history of HF. Chest xray unremarkable.  EKG shows sinus vs atrial tachycardia but is regular.  Patient's rate improved with fluids.  INR is therapeutic.   Patient was recently started on synthroid so TSH was sent and pending.  THe patient reports improvement of symptoms.  Have low suspicion for ACS or PE given therapeutic INR.  Symptoms likely rate related.  Spoke with on call doctor for [facility] who will get the follow-up appointment in 1-2 days.  After history, exam, and medical workup I feel the patient has been appropriately medically screened and is safe for discharge home. Pertinent diagnoses were discussed with the patient. Patient was given return precautions.  Shon Baton, MD 06/24/13 [phone]

## 2013-06-24 NOTE — ED Notes (Signed)
Patient transported to X-ray 

## 2013-06-24 NOTE — ED Notes (Signed)
Dr. Horton at the bedside.  

## 2013-06-24 NOTE — ED Notes (Signed)
Pt states he does not make urine. If he does it is only a few drops. Wife states he has only gone a few times since being home from the hospital.

## 2013-06-24 NOTE — Progress Notes (Signed)
[address]       [name] [address]             [phone]        HPI:  Patient returns for routine postoperative follow-up having undergone emergent coronary bypass graft surgery x4 on 05/12/2013. The patient presented with a non-ST segment elevation MI and developed flash pulmonary edema and cardiogenic shock due to high grade left main and severe three-vessel coronary disease. He was taken the operating room in cardiogenic shock on high dose Levophed. An IABP could not be placed due to a AAA. The patient's early postoperative recovery while in the hospital was notable for development of acute on chronic renal failure requiring hemodialysis. The patient recovered enough to go to inpatient rehabilitation in the hospital and was then discharged from rehabilitation back to the hospitalist service until he had been on dialysis long enough to qualify for outpatient dialysis care. Since hospital discharge the patient reports he has not felt well. He has had persistent nausea as well as fatigue and dizziness.   Current Outpatient Prescriptions  Medication Sig Dispense Refill  . atorvastatin (LIPITOR) 20 MG tablet Take 1 tablet (20 mg total) by mouth daily at 6 PM.  30 tablet  0  . calcium acetate (PHOSLO) 667 MG capsule Take 1 capsule (667 mg total) by mouth 3 (three) times daily with meals.  90 capsule  0  . diltiazem (TIAZAC) 360 MG 24 hr capsule Take 360 mg by mouth daily.      [name] levothyroxine (SYNTHROID, LEVOTHROID) 100 MCG tablet Take 1 tablet (100 mcg total) by mouth daily before breakfast.  30 tablet  0  . pantoprazole (PROTONIX) 40 MG tablet Take 1 tablet (40 mg total) by mouth daily.  30 tablet  0  . warfarin (COUMADIN) 2.5 MG tablet Take 1 tablet (2.5 mg total) by mouth daily at 6 PM.  30 tablet  0  . acetaminophen (TYLENOL) 325 MG tablet Take 1-2 tablets (325-650 mg total) by mouth every 6 (six) hours as needed for pain.      . feeding supplement (RESOURCE  BREEZE) LIQD Take 1 Container by mouth 3 (three) times daily between meals.      . meclizine (ANTIVERT) 25 MG tablet Take 25 mg by mouth 3 (three) times daily.      . multivitamin (RENA-VIT) TABS tablet Take 1 tablet by mouth daily.      . ondansetron (ZOFRAN) 4 MG tablet Take 1 tablet (4 mg total) by mouth every 8 (eight) hours as needed for nausea.  20 tablet  0   No current facility-administered medications for this visit.    Physical Exam: BP 86/62  Pulse 133  Resp 16  Ht 6\' 1"  (1.854 m)  Wt 198 lb (89.812 kg)  BMI 26.13 kg/m2  SpO2 97% He is in no distress. Cardiac exam shows a regular rate and rhythm with tachycardia at a rate of 150 beats per minute. There is no murmur. Lung exam is clear. The chest incision is healing well and sternum is stable. The leg incisions are healing well and there is no peripheral edema.  Diagnostic Tests:  *RADIOLOGY REPORT*  Clinical Data: History of CABG, some chest pain and nausea  CHEST - 2 VIEW  Comparison: Chest x-ray of 05/31/2013  Findings: Only mild basilar atelectasis and/or scarring remains.  No effusion is seen. Cardiomegaly is stable. A right central  venous line is unchanged in position. Median sternotomy  sutures  are noted from prior CABG.  IMPRESSION:  Mild bibasilar linear atelectasis and/or scarring. Stable  cardiomegaly.   Impression:  He is healing well after emergent coronary bypass graft surgery and has been undergoing outpatient hemodialysis in [place]. Today in the office a rhythm strip shows atrial flutter at a rate of 130-150 beats per minute. His blood pressure is 86/62 and he feels poorly although he says his felt like this since his hospital discharge. His wife said that she checked his blood pressure this morning and it was 120 with a pulse in the 80s. He does have a history of previous atrial fibrillation prior to his myocardial infarction. He has been maintained on Coumadin. I discussed him with Norma Fredrickson, NP  and we have decided to send him to the emergency room at [facility] for evaluation and treatment by cardiology. He seems most concerned about his persistent nausea which could be medication related. He was diagnosed with hypothyroidism during his hospitalization and was started on Synthroid but I don't know if thyroid levels have been followed up.  Plan:  The patient's family will take him directly to the emergency room.

## 2013-06-25 LAB — TSH: TSH: 6.572 u[IU]/mL — ABNORMAL HIGH (ref 0.350–4.500)

## 2013-06-29 ENCOUNTER — Ambulatory Visit (INDEPENDENT_AMBULATORY_CARE_PROVIDER_SITE_OTHER): Payer: Medicare Other | Admitting: Cardiovascular Disease

## 2013-06-29 DIAGNOSIS — Z7901 Long term (current) use of anticoagulants: Secondary | ICD-10-CM

## 2013-06-29 DIAGNOSIS — Z951 Presence of aortocoronary bypass graft: Secondary | ICD-10-CM

## 2013-06-29 DIAGNOSIS — I480 Paroxysmal atrial fibrillation: Secondary | ICD-10-CM

## 2013-06-29 DIAGNOSIS — I4891 Unspecified atrial fibrillation: Secondary | ICD-10-CM

## 2013-06-30 ENCOUNTER — Emergency Department (HOSPITAL_COMMUNITY): Payer: Medicare Other

## 2013-06-30 ENCOUNTER — Observation Stay (HOSPITAL_COMMUNITY): Payer: Medicare Other

## 2013-06-30 ENCOUNTER — Observation Stay (HOSPITAL_COMMUNITY)
Admission: EM | Admit: 2013-06-30 | Discharge: 2013-07-03 | Disposition: A | Discharge status: Home / Self Care | Payer: Medicare Other | Attending: Internal Medicine | Admitting: Internal Medicine

## 2013-06-30 ENCOUNTER — Encounter (HOSPITAL_COMMUNITY): Payer: Self-pay | Admitting: Emergency Medicine

## 2013-06-30 DIAGNOSIS — R45851 Suicidal ideations: Secondary | ICD-10-CM | POA: Insufficient documentation

## 2013-06-30 DIAGNOSIS — R51 Headache: Secondary | ICD-10-CM | POA: Insufficient documentation

## 2013-06-30 DIAGNOSIS — K222 Esophageal obstruction: Secondary | ICD-10-CM | POA: Insufficient documentation

## 2013-06-30 DIAGNOSIS — I2510 Atherosclerotic heart disease of native coronary artery without angina pectoris: Secondary | ICD-10-CM | POA: Insufficient documentation

## 2013-06-30 DIAGNOSIS — M129 Arthropathy, unspecified: Secondary | ICD-10-CM | POA: Insufficient documentation

## 2013-06-30 DIAGNOSIS — F3289 Other specified depressive episodes: Secondary | ICD-10-CM | POA: Insufficient documentation

## 2013-06-30 DIAGNOSIS — I120 Hypertensive chronic kidney disease with stage 5 chronic kidney disease or end stage renal disease: Secondary | ICD-10-CM | POA: Insufficient documentation

## 2013-06-30 DIAGNOSIS — I252 Old myocardial infarction: Secondary | ICD-10-CM | POA: Insufficient documentation

## 2013-06-30 DIAGNOSIS — Z86718 Personal history of other venous thrombosis and embolism: Secondary | ICD-10-CM | POA: Insufficient documentation

## 2013-06-30 DIAGNOSIS — H9190 Unspecified hearing loss, unspecified ear: Secondary | ICD-10-CM | POA: Insufficient documentation

## 2013-06-30 DIAGNOSIS — N186 End stage renal disease: Secondary | ICD-10-CM | POA: Insufficient documentation

## 2013-06-30 DIAGNOSIS — K224 Dyskinesia of esophagus: Secondary | ICD-10-CM | POA: Insufficient documentation

## 2013-06-30 DIAGNOSIS — Z87891 Personal history of nicotine dependence: Secondary | ICD-10-CM | POA: Insufficient documentation

## 2013-06-30 DIAGNOSIS — R1310 Dysphagia, unspecified: Secondary | ICD-10-CM | POA: Diagnosis present

## 2013-06-30 DIAGNOSIS — E785 Hyperlipidemia, unspecified: Secondary | ICD-10-CM | POA: Insufficient documentation

## 2013-06-30 DIAGNOSIS — E039 Hypothyroidism, unspecified: Secondary | ICD-10-CM | POA: Insufficient documentation

## 2013-06-30 DIAGNOSIS — Z79899 Other long term (current) drug therapy: Secondary | ICD-10-CM | POA: Insufficient documentation

## 2013-06-30 DIAGNOSIS — F39 Unspecified mood [affective] disorder: Secondary | ICD-10-CM

## 2013-06-30 DIAGNOSIS — M069 Rheumatoid arthritis, unspecified: Secondary | ICD-10-CM | POA: Insufficient documentation

## 2013-06-30 DIAGNOSIS — I714 Abdominal aortic aneurysm, without rupture, unspecified: Secondary | ICD-10-CM | POA: Insufficient documentation

## 2013-06-30 DIAGNOSIS — Z7901 Long term (current) use of anticoagulants: Secondary | ICD-10-CM | POA: Insufficient documentation

## 2013-06-30 DIAGNOSIS — F329 Major depressive disorder, single episode, unspecified: Secondary | ICD-10-CM | POA: Insufficient documentation

## 2013-06-30 DIAGNOSIS — I951 Orthostatic hypotension: Secondary | ICD-10-CM | POA: Insufficient documentation

## 2013-06-30 DIAGNOSIS — Z992 Dependence on renal dialysis: Secondary | ICD-10-CM | POA: Diagnosis present

## 2013-06-30 DIAGNOSIS — Z951 Presence of aortocoronary bypass graft: Secondary | ICD-10-CM

## 2013-06-30 DIAGNOSIS — I739 Peripheral vascular disease, unspecified: Secondary | ICD-10-CM | POA: Insufficient documentation

## 2013-06-30 DIAGNOSIS — Z8546 Personal history of malignant neoplasm of prostate: Secondary | ICD-10-CM | POA: Insufficient documentation

## 2013-06-30 DIAGNOSIS — I4892 Unspecified atrial flutter: Secondary | ICD-10-CM | POA: Diagnosis present

## 2013-06-30 DIAGNOSIS — I509 Heart failure, unspecified: Secondary | ICD-10-CM | POA: Insufficient documentation

## 2013-06-30 DIAGNOSIS — R1313 Dysphagia, pharyngeal phase: Principal | ICD-10-CM | POA: Insufficient documentation

## 2013-06-30 DIAGNOSIS — I5043 Acute on chronic combined systolic (congestive) and diastolic (congestive) heart failure: Secondary | ICD-10-CM | POA: Diagnosis present

## 2013-06-30 HISTORY — DX: Atherosclerotic heart disease of native coronary artery without angina pectoris: I25.10

## 2013-06-30 HISTORY — DX: Unspecified atrial flutter: I48.92

## 2013-06-30 HISTORY — DX: Fracture of neck, unspecified, initial encounter: S12.9XXA

## 2013-06-30 HISTORY — DX: Chronic systolic (congestive) heart failure: I50.22

## 2013-06-30 HISTORY — DX: Abdominal aortic aneurysm, without rupture, unspecified: I71.40

## 2013-06-30 HISTORY — DX: Venous insufficiency (chronic) (peripheral): I87.2

## 2013-06-30 HISTORY — DX: Dependence on renal dialysis: Z99.2

## 2013-06-30 HISTORY — DX: Abdominal aortic aneurysm, without rupture: I71.4

## 2013-06-30 HISTORY — DX: Cerebral infarction, unspecified: I63.9

## 2013-06-30 HISTORY — DX: Acute embolism and thrombosis of unspecified deep veins of unspecified lower extremity: I82.409

## 2013-06-30 HISTORY — DX: Hyperlipidemia, unspecified: E78.5

## 2013-06-30 HISTORY — DX: Malignant neoplasm of prostate: C61

## 2013-06-30 HISTORY — DX: Dissection of iliac artery: I77.72

## 2013-06-30 HISTORY — DX: Gastro-esophageal reflux disease without esophagitis: K21.9

## 2013-06-30 HISTORY — DX: Peripheral vascular disease, unspecified: I73.9

## 2013-06-30 HISTORY — DX: Rheumatoid arthritis, unspecified: M06.9

## 2013-06-30 HISTORY — DX: Dysphagia, unspecified: R13.10

## 2013-06-30 HISTORY — DX: Acute myocardial infarction, unspecified: I21.9

## 2013-06-30 HISTORY — DX: End stage renal disease: N18.6

## 2013-06-30 LAB — BASIC METABOLIC PANEL
BUN: 22 mg/dL (ref 6–23)
CO2: 23 mEq/L (ref 19–32)
Chloride: 100 mEq/L (ref 96–112)
GFR calc non Af Amer: 7 mL/min — ABNORMAL LOW (ref >90)
Glucose, Bld: 90 mg/dL (ref 70–99)
Potassium: 4.6 mEq/L (ref 3.5–5.1)

## 2013-06-30 LAB — PROTIME-INR: INR: 1.67 — ABNORMAL HIGH (ref 0.00–1.49)

## 2013-06-30 LAB — TROPONIN I: Troponin I: <0.3 ng/mL (ref <0.30)

## 2013-06-30 LAB — CBC WITH DIFFERENTIAL/PLATELET
Eosinophils Absolute: 0.2 10*3/uL (ref 0.0–0.7)
Hemoglobin: 12.1 g/dL — ABNORMAL LOW (ref 13.0–17.0)
Lymphocytes Relative: 53 % — ABNORMAL HIGH (ref 12–46)
Lymphs Abs: 4.9 10*3/uL — ABNORMAL HIGH (ref 0.7–4.0)
MCH: 27.4 pg (ref 26.0–34.0)
Monocytes Relative: 8 % (ref 3–12)
Neutrophils Relative %: 36 % — ABNORMAL LOW (ref 43–77)
RBC: 4.42 MIL/uL (ref 4.22–5.81)
WBC: 9.3 10*3/uL (ref 4.0–10.5)

## 2013-06-30 MED ORDER — LIDOCAINE HCL (PF) 1 % IJ SOLN: 5.0000 mL | INTRAMUSCULAR | Status: DC | PRN

## 2013-06-30 MED ORDER — DILTIAZEM HCL ER COATED BEADS 360 MG PO CP24
360.0000 mg | ORAL_CAPSULE | Freq: Every day | ORAL | Status: DC
  Administered 2013-06-30 – 2013-07-01 (×2): 360 mg via ORAL
  Filled 2013-06-30 (×3): qty 1

## 2013-06-30 MED ORDER — SODIUM CHLORIDE 0.9 % IV SOLN: 100.0000 mL | INTRAVENOUS | Status: DC | PRN

## 2013-06-30 MED ORDER — FONDAPARINUX SODIUM 2.5 MG/0.5ML ~~LOC~~ SOLN: 2.5000 mg | Freq: Every day | SUBCUTANEOUS | Status: DC

## 2013-06-30 MED ORDER — PANTOPRAZOLE SODIUM 40 MG PO TBEC
40.0000 mg | DELAYED_RELEASE_TABLET | Freq: Two times a day (BID) | ORAL | Status: DC
  Administered 2013-07-01 – 2013-07-03 (×5): 40 mg via ORAL
  Filled 2013-06-30 (×4): qty 1

## 2013-06-30 MED ORDER — ONDANSETRON HCL 4 MG PO TABS: 4.0000 mg | ORAL_TABLET | Freq: Four times a day (QID) | ORAL | Status: DC | PRN

## 2013-06-30 MED ORDER — LIDOCAINE-PRILOCAINE 2.5-2.5 % EX CREA
1.0000 | TOPICAL_CREAM | CUTANEOUS | Status: DC | PRN
Start: 2013-06-30 — End: 2013-07-02

## 2013-06-30 MED ORDER — RENA-VITE PO TABS
1.0000 | ORAL_TABLET | Freq: Every day | ORAL | Status: DC
  Administered 2013-07-01 – 2013-07-02 (×3): 1 via ORAL
  Filled 2013-06-30 (×5): qty 1

## 2013-06-30 MED ORDER — DILTIAZEM HCL ER BEADS 240 MG PO CP24
360.0000 mg | ORAL_CAPSULE | Freq: Every day | ORAL | Status: DC
  Filled 2013-06-30: qty 1

## 2013-06-30 MED ORDER — MECLIZINE HCL 25 MG PO TABS
25.0000 mg | ORAL_TABLET | Freq: Three times a day (TID) | ORAL | Status: DC | PRN
  Filled 2013-06-30: qty 1

## 2013-06-30 MED ORDER — ACETAMINOPHEN 325 MG PO TABS
650.0000 mg | ORAL_TABLET | Freq: Four times a day (QID) | ORAL | Status: DC | PRN
  Administered 2013-07-01: 650 mg via ORAL
  Filled 2013-06-30: qty 2

## 2013-06-30 MED ORDER — SODIUM CHLORIDE 0.9 % IJ SOLN
3.0000 mL | Freq: Two times a day (BID) | INTRAMUSCULAR | Status: DC
Start: 2013-06-30 — End: 2013-07-03
  Administered 2013-07-01 – 2013-07-02 (×5): 3 mL via INTRAVENOUS

## 2013-06-30 MED ORDER — ATORVASTATIN CALCIUM 20 MG PO TABS
20.0000 mg | ORAL_TABLET | Freq: Every day | ORAL | Status: DC
  Administered 2013-07-01 – 2013-07-02 (×3): 20 mg via ORAL
  Filled 2013-06-30 (×5): qty 1

## 2013-06-30 MED ORDER — ALTEPLASE 2 MG IJ SOLR
2.0000 mg | Freq: Once | INTRAMUSCULAR | Status: AC | PRN
  Filled 2013-06-30: qty 2

## 2013-06-30 MED ORDER — WARFARIN - PHARMACIST DOSING INPATIENT
Freq: Every day | Status: DC
  Administered 2013-06-30: 20:00:00

## 2013-06-30 MED ORDER — HEPARIN SODIUM (PORCINE) 1000 UNIT/ML DIALYSIS
1000.0000 [IU] | INTRAMUSCULAR | Status: DC | PRN
  Filled 2013-06-30: qty 1

## 2013-06-30 MED ORDER — PANTOPRAZOLE SODIUM 40 MG PO TBEC
40.0000 mg | DELAYED_RELEASE_TABLET | Freq: Every day | ORAL | Status: DC
  Administered 2013-06-30: 40 mg via ORAL
  Filled 2013-06-30: qty 1

## 2013-06-30 MED ORDER — PENTAFLUOROPROP-TETRAFLUOROETH EX AERO: 1.0000 "application " | INHALATION_SPRAY | CUTANEOUS | Status: DC | PRN

## 2013-06-30 MED ORDER — WARFARIN SODIUM 5 MG PO TABS
5.0000 mg | ORAL_TABLET | Freq: Once | ORAL | Status: AC
  Administered 2013-07-01: 5 mg via ORAL
  Filled 2013-06-30: qty 1

## 2013-06-30 MED ORDER — NEPRO/CARBSTEADY PO LIQD: 237.0000 mL | ORAL | Status: DC | PRN

## 2013-06-30 MED ORDER — ONDANSETRON HCL 4 MG/2ML IJ SOLN
4.0000 mg | Freq: Four times a day (QID) | INTRAMUSCULAR | Status: DC | PRN
  Administered 2013-06-30: 4 mg via INTRAVENOUS
  Filled 2013-06-30: qty 2

## 2013-06-30 MED ORDER — CALCIUM ACETATE 667 MG PO CAPS
667.0000 mg | ORAL_CAPSULE | Freq: Three times a day (TID) | ORAL | Status: DC
  Administered 2013-07-01 – 2013-07-03 (×7): 667 mg via ORAL
  Filled 2013-06-30 (×11): qty 1

## 2013-06-30 MED ORDER — ACETAMINOPHEN 650 MG RE SUPP: 650.0000 mg | Freq: Four times a day (QID) | RECTAL | Status: DC | PRN

## 2013-06-30 MED ORDER — LEVOTHYROXINE SODIUM 100 MCG PO TABS
100.0000 ug | ORAL_TABLET | Freq: Every day | ORAL | Status: DC
  Administered 2013-07-01 – 2013-07-03 (×3): 100 ug via ORAL
  Filled 2013-06-30 (×4): qty 1

## 2013-06-30 NOTE — ED Provider Notes (Signed)
CSN: [id number]     Arrival date & time 06/30/13  4098 History   First MD Initiated Contact with Patient 06/30/13 [phone]     Chief Complaint  Patient presents with  . Dysphagia    HPI Pt was seen at 0730. Per pt and family, c/o gradual onset and worsening of persistent dysphagia for the past 2 months. Pt states food "gets caught" in his throat, then he "chokes and gags a little," then is able to swallow the food. States "the only thing I can do is drink water." Denies speaking to any outpatient MD for same. Denies CP/palpitations, no SOB/cough, no back pain, no sore throat, no fevers, no dysarthria, no facial droop, no focal motor weakness, no tingling/numbness in extremities.    Past Medical History  Diagnosis Date  . Hypertension   . Anginal pain   . Depression   . Arthritis   . Peripheral edema     chronic/wears TEDs  . 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 Dialysis patient   . Coronary artery disease   . CKD (chronic kidney disease)   . Acute respiratory failure 05/2013    in the setting of cardiac ischemia  . Myocardial infarction 05/2013  . Peripheral arterial disease   . Atrial flutter   . DVT (deep venous thrombosis) 02/2011    LLL  . GERD (gastroesophageal reflux disease)   . Compression fracture of cervical spine   . Rheumatoid arthritis   . Prostate cancer   . Stasis dermatitis     LLL  . AAA (abdominal aortic aneurysm)   . 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 CHF (congestive heart failure)   . Hyperlipidemia   . Dysphagia 05/2013    after prolonged intubation  . Stroke    Past Surgical History  Procedure Laterality Date  . Eye surgery    . Coronary artery bypass graft N/A 05/13/2013    Procedure: CORONARY ARTERY BYPASS GRAFTING (CABG);  Surgeon: Alleen Borne, MD;  Location: [facility] OR;  Service: Open Heart Surgery;  Laterality: N/A;  . Insertion of dialysis catheter N/A 05/26/2013    Procedure: INSERTION OF DIALYSIS CATHETER;  Surgeon: Sherren Kerns, MD;   Location: [facility] OR;  Service: Vascular;  Laterality: N/A;  right IJ  . Cardiac surgery    . Cataract extraction      History  Substance Use Topics  . Smoking status: Former Games developer  . Smokeless tobacco: Not on file  . Alcohol Use: No    Review of Systems ROS: Statement: All systems negative except as marked or noted in the HPI; Constitutional: Negative for fever and chills. ; ; Eyes: Negative for eye pain, redness and discharge. ; ; ENMT: Negative for ear pain, hoarseness, nasal congestion, sinus pressure and sore throat. ; ; Cardiovascular: Negative for chest pain, palpitations, diaphoresis, dyspnea and peripheral edema. ; ; Respiratory: Negative for cough, wheezing and stridor. ; ; Gastrointestinal: +dysphagia. Negative for nausea, vomiting, diarrhea, abdominal pain, blood in stool, hematemesis, jaundice and rectal bleeding. . ; ; Genitourinary: Negative for dysuria, flank pain and hematuria. ; ; Musculoskeletal: Negative for back pain and neck pain. Negative for swelling and trauma.; ; Skin: Negative for pruritus, rash, abrasions, blisters, bruising and skin lesion.; ; Neuro: Negative for headache, lightheadedness and neck stiffness. Negative for weakness, altered level of consciousness , altered mental status, extremity weakness, paresthesias, involuntary movement, seizure and syncope.      Allergies  Heparin  Home Medications   Current Outpatient Rx  Name  Route  Sig  Dispense  Refill  . acetaminophen (TYLENOL) 325 MG tablet   Oral   Take 1-2 tablets (325-650 mg total) by mouth every 6 (six) hours as needed for pain.         [name] atorvastatin (LIPITOR) 20 MG tablet   Oral   Take 1 tablet (20 mg total) by mouth daily at 6 PM.   30 tablet   0   . calcium acetate (PHOSLO) 667 MG capsule   Oral   Take 1 capsule (667 mg total) by mouth 3 (three) times daily with meals.   90 capsule   0   . diltiazem (TIAZAC) 360 MG 24 hr capsule   Oral   Take 360 mg by mouth daily.         .  feeding supplement (RESOURCE BREEZE) LIQD   Oral   Take 1 Container by mouth 3 (three) times daily between meals.         [name] levothyroxine (SYNTHROID, LEVOTHROID) 100 MCG tablet   Oral   Take 1 tablet (100 mcg total) by mouth daily before breakfast.   30 tablet   0   . meclizine (ANTIVERT) 25 MG tablet   Oral   Take 25 mg by mouth 3 (three) times daily.         . multivitamin (RENA-VIT) TABS tablet   Oral   Take 1 tablet by mouth daily.         . ondansetron (ZOFRAN) 4 MG tablet   Oral   Take 1 tablet (4 mg total) by mouth every 8 (eight) hours as needed for nausea.   20 tablet   0   . pantoprazole (PROTONIX) 40 MG tablet   Oral   Take 1 tablet (40 mg total) by mouth daily.   30 tablet   0   . warfarin (COUMADIN) 2.5 MG tablet   Oral   Take 1 tablet (2.5 mg total) by mouth daily at 6 PM.   30 tablet   0    BP 123/65  Pulse 123  Temp(Src) 97.5 F (36.4 C) (Oral)  Resp 29  SpO2 100% Physical Exam 0735: Physical examination:  Nursing notes reviewed; Vital signs and O2 SAT reviewed;  Constitutional: Well developed, Well nourished, Well hydrated, In no acute distress; Head:  Normocephalic, atraumatic; Eyes: EOMI, PERRL, No scleral icterus; ENMT: Mouth and pharynx normal, Mucous membranes moist. Mouth and pharynx without lesions. No tonsillar exudates. No intra-oral edema. No submandibular or sublingual edema. No hoarse voice, no drooling, no stridor. No pain with manipulation of larynx.;;; Neck: Supple, Full range of motion, No lymphadenopathy; Cardiovascular: Tachycardic rate and rhythm, No gallop; Respiratory: Breath sounds clear & equal bilaterally, No rales, rhonchi, wheezes.  Speaking full sentences with ease, Normal respiratory effort/excursion; Chest: Nontender, Movement normal; Abdomen: Soft, Nontender, Nondistended, Normal bowel sounds; Genitourinary: No CVA tenderness; Extremities: Pulses normal, No tenderness, No edema, No calf edema or asymmetry.; Neuro:  AA&Ox3, Major CN grossly intact.  Speech clear. No gross focal motor or sensory deficits in extremities.; Skin: Color normal, Warm, Dry.   ED Course  Procedures    MDM  MDM Reviewed: previous chart, nursing note and vitals Reviewed previous: labs, ECG and ultrasound Interpretation: labs, x-ray, CT scan and ECG      Date: 06/30/2013  Rate: 122  Rhythm: sinus tachycardia  QRS Axis: normal  Intervals: normal  ST/T Wave abnormalities: nonspecific ST/T changes  Conduction Disutrbances:nonspecific intraventricular conduction delay  Narrative Interpretation:   Old EKG Reviewed: unchanged; no significant changes from previous EKG ddated 06/24/2013.  Results for orders placed during the hospital encounter of 06/30/13  CBC WITH DIFFERENTIAL      Result Value Range   WBC 9.3  4.0 - 10.5 K/uL   RBC 4.42  4.22 - 5.81 MIL/uL   Hemoglobin 12.1 (*) 13.0 - 17.0 g/dL   HCT 16.1  09.6 - 04.5 %   MCV 88.2  78.0 - 100.0 fL   MCH 27.4  26.0 - 34.0 pg   MCHC 31.0  30.0 - 36.0 g/dL   RDW 40.9 (*) 81.1 - 91.4 %   Platelets 253  150 - 400 K/uL   Neutrophils Relative % 36 (*) 43 - 77 %   Neutro Abs 3.3  1.7 - 7.7 K/uL   Lymphocytes Relative 53 (*) 12 - 46 %   Lymphs Abs 4.9 (*) 0.7 - 4.0 K/uL   Monocytes Relative 8  3 - 12 %   Monocytes Absolute 0.7  0.1 - 1.0 K/uL   Eosinophils Relative 3  0 - 5 %   Eosinophils Absolute 0.2  0.0 - 0.7 K/uL   Basophils Relative 0  0 - 1 %   Basophils Absolute 0.0  0.0 - 0.1 K/uL  BASIC METABOLIC PANEL      Result Value Range   Sodium 137  135 - 145 mEq/L   Potassium 4.6  3.5 - 5.1 mEq/L   Chloride 100  96 - 112 mEq/L   CO2 23  19 - 32 mEq/L   Glucose, Bld 90  70 - 99 mg/dL   BUN 22  6 - 23 mg/dL   Creatinine, Ser 7.82 (*) 0.50 - 1.35 mg/dL   Calcium 9.0  8.4 - 95.6 mg/dL   GFR calc non Af Amer 7 (*) >90 mL/min   GFR calc Af Amer 8 (*) >90 mL/min  PROTIME-INR      Result Value Range   Prothrombin Time 19.2 (*) 11.6 - 15.2 seconds   INR 1.67 (*) 0.00  - 1.49  TROPONIN I      Result Value Range   Troponin I <0.30  <0.30 ng/mL   Dg Neck Soft Tissue 06/30/2013   *RADIOLOGY REPORT*  Clinical Data: Nausea, dysphasia  NECK SOFT TISSUES - 1+ VIEW  Comparison: None.  Findings: Lateral neck soft tissues are unremarkable.  No prevertebral soft tissue swelling.  Degenerative changes of the visualized cervical spine.  The patient is edentulous.  IMPRESSION: Unremarkable lateral neck soft tissues.   Original Report Authenticated By: Charline Bills, M.D.   Dg Chest 2 View 06/30/2013   *RADIOLOGY REPORT*  Clinical Data: Dysphasia, nausea  CHEST - 2 VIEW  Comparison: 06/24/2013  Findings: Bilateral lower lobe scarring and/or atelectasis. No pleural effusion or pneumothorax.  The heart is normal in size. Postsurgical changes related to prior CABG.  Stable right subclavian dual lumen central venous catheter.  IMPRESSION: No evidence of acute cardiopulmonary disease.   Original Report Authenticated By: Charline Bills, M.D.   Ct Head Wo Contrast 06/30/2013   CLINICAL DATA:  Difficulty swallowing. Frontal headache.  EXAM: CT HEAD WITHOUT CONTRAST  TECHNIQUE: Contiguous axial images were obtained from the base of the skull through the vertex without intravenous contrast.  COMPARISON:  MRI 03/01/2011.  FINDINGS: Mild age related volume loss. No acute intracranial abnormality. Specifically, no hemorrhage, hydrocephalus, mass lesion, acute infarction, or significant intracranial injury. No acute calvarial abnormality.  Visualized paranasal sinuses and mastoids clear. Orbital soft tissues unremarkable.  IMPRESSION: No acute intracranial abnormality.   Electronically Signed   By: Charlett Nose   On: 06/30/2013 08:41    0920:  ED RN performed swallow screen: pt absolutely refuses to swallow anything except the water. Encouraged by myself and ED RN to swallow cracker; adamantly refuses.  EPIC chart extensively reviewed: S&S notes from 05/2013 admit note that pt was able to  progress from dysphagia diet to regular, but minimal notes after that regarding pt's ability to swallow. Nutrition notes that pt's PO intake is "poor." No further notes or testing found regarding pt's dysphagia. CT head today without stroke, CXR without signs of aspiration, XR soft tissue neck unremarkable. Pt has already been to the ED 5 days ago for hypotension/tachycardia post HD treatment that was attributed to dehydration. Concern regarding pt's ability to maintain adequate nutritional intake at home, given his already deconditioned state s/p hospitalization. T/C to [facility] Resident, case discussed, including:  HPI, pertinent PM/SHx, VS/PE, dx testing, ED course and treatment:  Agreeable to admit, requests they will come to ED for eval.      Laray Anger, DO 07/01/13 2236

## 2013-06-30 NOTE — Progress Notes (Signed)
Pt arrived to the unit via stretcher. Pt is alert and oriented x4. Ambulatory x 1 assist. A stage II present on saccrum with mepilex dressing intact.. Pt admits to wanting to harm himself and he is on suicide precautions with sitter at the bedside... Vs are stable. Pt oriented to the unit and staff.[name] KitchenMarland KitchenMarland [name]

## 2013-06-30 NOTE — Consult Note (Signed)
This patient is a 79yo WM with  with oligoanuric ischemic ATN post CP/cardiogenic shock, cath, CABG (7/9), requiring renal replacement therapy since 7/17 with CRRT, transitioned to intermittent HD on 7/21, now with TDC placed 7/22.  He is receiving hemodialysis in [place], [place]. TTS. He dialyzes via a R IJ PC.  He does not have a permanent access.  He wants one and needs one.  He is admitted on this occasion with dysphagia issues and depression.  We are asked to assist with dialysis related care.  Past Medical History  Diagnosis Date  . Hypertension   . Depression   . 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 Coronary artery disease   . ESRD on dialysis 06/2013    TTS HD in [place]  . Myocardial infarction 05/2013    NSTEMI, s/p CABG 05/13/13  . Peripheral arterial disease   . Atrial flutter   . DVT (deep venous thrombosis) 02/2011    LLL  . GERD (gastroesophageal reflux disease)   . Compression fracture of cervical spine   . Rheumatoid arthritis   . Prostate cancer   . Stasis dermatitis     LLL, wears TED hose  . AAA (abdominal aortic aneurysm)   . 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 CHF (congestive heart failure)     EF 45-55% per echo 05/28/13  . Hyperlipidemia   . Dysphagia 05/2013    after prolonged intubation  . Stroke    Past Surgical History  Procedure Laterality Date  . Eye surgery    . Coronary artery bypass graft N/A 05/13/2013    Procedure: CORONARY ARTERY BYPASS GRAFTING (CABG);  Surgeon: Alleen Borne, MD;  Location: [facility] OR;  Service: Open Heart Surgery;  Laterality: N/A;  . Insertion of dialysis catheter N/A 05/26/2013    Procedure: INSERTION OF DIALYSIS CATHETER;  Surgeon: Sherren Kerns, MD;  Location: [facility] OR;  Service: Vascular;  Laterality: N/A;  right IJ  . Cardiac surgery    . Cataract extraction     Social History:  reports that he quit smoking about 12 years ago. He has never used smokeless tobacco. He reports that he does not drink alcohol or use illicit  drugs. Allergies:  Allergies  Allergen Reactions  . 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 No pertinent family history.  Medications:  Scheduled: . atorvastatin  20 mg Oral q1800  . calcium acetate  667 mg Oral TID WC  . diltiazem  360 mg Oral Daily  . [START ON 07/01/2013] levothyroxine  100 mcg Oral QAC breakfast  . multivitamin  1 tablet Oral QHS  . pantoprazole  40 mg Oral Daily  . sodium chloride  3 mL Intravenous Q12H  . warfarin  5 mg Oral ONCE-1800  . Warfarin - Pharmacist Dosing Inpatient   Does not apply q1800   ROS: he is tearful when discussing his depression  Blood pressure 105/68, pulse 121, temperature 97.7 F (36.5 C), temperature source Oral, resp. rate 20, SpO2 98.00%.  General appearance: alert and cooperative Head: Normocephalic, without obvious abnormality, atraumatic Eyes: negative Nose: no discharge Throat: lips, mucosa, and tongue normal; teeth and gums normal Resp: clear to auscultation bilaterally Chest wall: no tenderness Cardio: tachycardic and regular GI: soft, non-tender; bowel sounds normal; no masses,  no organomegaly Extremities: extremities normal, atraumatic, no cyanosis or edema Skin: Skin color, texture, turgor normal. No rashes or lesions  Neurologic: Grossly normal Results for orders placed during the hospital encounter of 06/30/13 (from the past 48 hour(s))  CBC WITH DIFFERENTIAL     Status: Abnormal   Collection Time    06/30/13  7:42 AM      Result Value Range   WBC 9.3  4.0 - 10.5 K/uL   RBC 4.42  4.22 - 5.81 MIL/uL   Hemoglobin 12.1 (*) 13.0 - 17.0 g/dL   HCT 40.9  81.1 - 91.4 %   MCV 88.2  78.0 - 100.0 fL   MCH 27.4  26.0 - 34.0 pg   MCHC 31.0  30.0 - 36.0 g/dL   RDW 78.2 (*) 95.6 - 21.3 %   Platelets 253  150 - 400 K/uL   Neutrophils Relative % 36 (*) 43 - 77 %   Neutro Abs 3.3  1.7 - 7.7 K/uL   Lymphocytes Relative 53 (*) 12 - 46 %   Lymphs Abs 4.9 (*) 0.7 - 4.0 K/uL   Monocytes Relative 8  3 - 12 %    Monocytes Absolute 0.7  0.1 - 1.0 K/uL   Eosinophils Relative 3  0 - 5 %   Eosinophils Absolute 0.2  0.0 - 0.7 K/uL   Basophils Relative 0  0 - 1 %   Basophils Absolute 0.0  0.0 - 0.1 K/uL  BASIC METABOLIC PANEL     Status: Abnormal   Collection Time    06/30/13  7:42 AM      Result Value Range   Sodium 137  135 - 145 mEq/L   Potassium 4.6  3.5 - 5.1 mEq/L   Chloride 100  96 - 112 mEq/L   CO2 23  19 - 32 mEq/L   Glucose, Bld 90  70 - 99 mg/dL   BUN 22  6 - 23 mg/dL   Creatinine, Ser 0.86 (*) 0.50 - 1.35 mg/dL   Calcium 9.0  8.4 - 57.8 mg/dL   GFR calc non Af Amer 7 (*) >90 mL/min   GFR calc Af Amer 8 (*) >90 mL/min   Comment: (NOTE)     The eGFR has been calculated using the CKD EPI equation.     This calculation has not been validated in all clinical situations.     eGFR's persistently <90 mL/min signify possible Chronic Kidney     Disease.  PROTIME-INR     Status: Abnormal   Collection Time    06/30/13  7:42 AM      Result Value Range   Prothrombin Time 19.2 (*) 11.6 - 15.2 seconds   INR 1.67 (*) 0.00 - 1.49  TROPONIN I     Status: None   Collection Time    06/30/13  8:06 AM      Result Value Range   Troponin I <0.30  <0.30 ng/mL   Comment:            Due to the release kinetics of cTnI,     a negative result within the first hours     of the onset of symptoms does not rule out     myocardial infarction with certainty.     If myocardial infarction is still suspected,     repeat the test at appropriate intervals.   Dg Neck Soft Tissue  06/30/2013   *RADIOLOGY REPORT*  Clinical Data: Nausea, dysphasia  NECK SOFT TISSUES - 1+ VIEW  Comparison: None.  Findings: Lateral neck soft tissues are unremarkable.  No prevertebral soft tissue swelling.  Degenerative  changes of the visualized cervical spine.  The patient is edentulous.  IMPRESSION: Unremarkable lateral neck soft tissues.   Original Report Authenticated By: Charline Bills, M.D.   Dg Chest 2 View  06/30/2013    *RADIOLOGY REPORT*  Clinical Data: Dysphasia, nausea  CHEST - 2 VIEW  Comparison: 06/24/2013  Findings: Bilateral lower lobe scarring and/or atelectasis. No pleural effusion or pneumothorax.  The heart is normal in size. Postsurgical changes related to prior CABG.  Stable right subclavian dual lumen central venous catheter.  IMPRESSION: No evidence of acute cardiopulmonary disease.   Original Report Authenticated By: Charline Bills, M.D.   Ct Head Wo Contrast  06/30/2013   CLINICAL DATA:  Difficulty swallowing. Frontal headache.  EXAM: CT HEAD WITHOUT CONTRAST  TECHNIQUE: Contiguous axial images were obtained from the base of the skull through the vertex without intravenous contrast.  COMPARISON:  MRI 03/01/2011.  FINDINGS: Mild age related volume loss. No acute intracranial abnormality. Specifically, no hemorrhage, hydrocephalus, mass lesion, acute infarction, or significant intracranial injury. No acute calvarial abnormality. Visualized paranasal sinuses and mastoids clear. Orbital soft tissues unremarkable.  IMPRESSION: No acute intracranial abnormality.   Electronically Signed   By: Charlett Nose   On: 06/30/2013 08:41    Assessment:  1 ESRD 2 Aflutter with RVR 3 Dysphagia 4 Depression 5 ? Heparin allergy  Plan: 1 HD 3x per week TTS 2 Avoid heparin until allergy status verified  Raymona Boss C 06/30/2013, 3:00 PM

## 2013-06-30 NOTE — ED Notes (Signed)
Talked with Dr Dierdre Searles about patient having suicidal thoughts. Orders for sitter and suicide precautions. AC Candy notified of need for sitter. 6 east nurse notified of need for sitter.

## 2013-06-30 NOTE — Progress Notes (Signed)
ANTICOAGULATION CONSULT NOTE - Initial Consult  Pharmacy Consult for Coumadin Indication: Afib and history of DVT  Allergies  Allergen Reactions  . 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    Vital Signs: Temp: 97.7 F (36.5 C) (08/26 1126) Temp src: Oral (08/26 1126) BP: 105/68 mmHg (08/26 1225) Pulse Rate: 121 (08/26 1225)  Labs:  Recent Labs  06/29/13 06/30/13 0742 06/30/13 0806  HGB  --  12.1*  --   HCT  --  39.0  --   PLT  --  253  --   LABPROT  --  19.2*  --   INR 2.1 1.67*  --   CREATININE  --  6.75*  --   TROPONINI  --   --  <0.30    The CrCl is unknown because both a height and weight (above a minimum accepted value) are required for this calculation.   Medical History: Past Medical History  Diagnosis Date  . Hypertension   . Depression   . 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 Coronary artery disease   . ESRD on dialysis 06/2013    [place]  . Myocardial infarction 05/2013    NSTEMI, s/p CABG 05/13/13  . Peripheral arterial disease   . Atrial flutter   . DVT (deep venous thrombosis) 02/2011    LLL  . GERD (gastroesophageal reflux disease)   . Compression fracture of cervical spine   . Rheumatoid arthritis   . Prostate cancer   . Stasis dermatitis     LLL, wears TED hose  . AAA (abdominal aortic aneurysm)   . 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 CHF (congestive heart failure)     EF 45-55% per echo 05/28/13  . Hyperlipidemia   . Dysphagia 05/2013    after prolonged intubation  . Stroke     Assessment: 79 year old male recently discharged from rehab on Coumadin PTA for Afib and history of DVT.  Admit INR = 1.67 on home dose of 2.5 mg daily  Goal of Therapy:  INR 2-3 Monitor platelets by anticoagulation protocol: Yes   Plan:  1) Coumadin 5 mg po x 1 dose at 1800 pm 2) Daily INR  Thank you. Okey Regal, PharmD [phone]   06/30/2013,1:02 PM

## 2013-06-30 NOTE — Consult Note (Signed)
Referring Provider: Medical teaching service (Dr. Sharlene Motts, attending) Primary Care Physician:  Toma Deiters, MD Primary Gastroenterologist:  None (unassigned)  Reason for Consultation:  Dysphagia  HPI: [name] is a 79 y.o. male admitted to the hospital today because of progressive dysphagia symptoms and weight loss, which began following an MI treated with coronary artery bypass graft surgery about 6 weeks ago, complicated by ventilator dependent respiratory failure with intubation.  Initially, the patient had significant oropharyngeal dysphagia symptoms and seemed to be at risk for aspiration. With the help of speech therapy, those symptoms improved over the next week or so, to the point where he was able to be moved from n.p.o. status to a regular diet and a progressive fashion.  After going home, however, he began to have a different kind of dysphagia, namely, food getting "caught" in the "throat." This would happen only with firm food such as meat, not with soft foods such as spaghetti or mashed potatoes. It seems to be getting worse over time, and happens whenever he eats "solid" food, rather than occurring on an intermittent or sporadic basis. It appears he has lost about 20 pounds in weight since coming home from the hospital after his bypass surgery.  The patient has had some reflux symptoms in the past, and has intermittently been on prescription acid lowering medication. He has been on PPI therapy prior to this admission, but apparently that has not helped his dysphagia symptoms at all.  On top of all this, the patient has  Had the recent onset of atrial fibrillation, and is now maintained on Coumadin. He is also a dialysis patient.  A barium swallow today was performed; the official reading is still pending. However, I have reviewed the films and there appears to be a moderate, smooth stricture in the distal esophagus, extending over a couple of centimeters and with an aperture  on the order of a centimeter. The barium tablet lodged at this location and apparently did not pass through.   Past Medical History  Diagnosis Date  . Hypertension   . Depression   . 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 Coronary artery disease   . ESRD on dialysis 06/2013    TTS HD in [place]  . Myocardial infarction 05/2013    NSTEMI, s/p CABG 05/13/13  . Peripheral arterial disease   . Atrial flutter   . DVT (deep venous thrombosis) 02/2011    LLL  . GERD (gastroesophageal reflux disease)   . Compression fracture of cervical spine   . Rheumatoid arthritis   . Prostate cancer   . Stasis dermatitis     LLL, wears TED hose  . AAA (abdominal aortic aneurysm)   . 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 CHF (congestive heart failure)     EF 45-55% per echo 05/28/13  . Hyperlipidemia   . Dysphagia 05/2013    after prolonged intubation  . Stroke     Past Surgical History  Procedure Laterality Date  . Eye surgery    . Coronary artery bypass graft N/A 05/13/2013    Procedure: CORONARY ARTERY BYPASS GRAFTING (CABG);  Surgeon: Alleen Borne, MD;  Location: [facility] OR;  Service: Open Heart Surgery;  Laterality: N/A;  . Insertion of dialysis catheter N/A 05/26/2013    Procedure: INSERTION OF DIALYSIS CATHETER;  Surgeon: Sherren Kerns, MD;  Location: [facility] OR;  Service: Vascular;  Laterality: N/A;  right IJ  .  Cardiac surgery    . Cataract extraction      Prior to Admission medications   Medication Sig Start Date End Date Taking? Authorizing Provider  acetaminophen (TYLENOL) 325 MG tablet Take 1-2 tablets (325-650 mg total) by mouth every 6 (six) hours as needed for pain. 06/16/13  Yes Elease Etienne, MD  atorvastatin (LIPITOR) 20 MG tablet Take 1 tablet (20 mg total) by mouth daily at 6 PM. 06/16/13  Yes Elease Etienne, MD  calcium acetate (PHOSLO) 667 MG capsule Take 1 capsule (667 mg total) by mouth 3 (three) times daily with meals. 06/16/13  Yes Elease Etienne, MD  diltiazem  (TIAZAC) 360 MG 24 hr capsule Take 360 mg by mouth daily.   Yes Historical Provider, MD  feeding supplement (RESOURCE BREEZE) LIQD Take 1 Container by mouth 3 (three) times daily between meals. 06/16/13  Yes Elease Etienne, MD  levothyroxine (SYNTHROID, LEVOTHROID) 100 MCG tablet Take 1 tablet (100 mcg total) by mouth daily before breakfast. 06/16/13  Yes Elease Etienne, MD  meclizine (ANTIVERT) 25 MG tablet Take 25 mg by mouth 3 (three) times daily.   Yes Historical Provider, MD  multivitamin (RENA-VIT) TABS tablet Take 1 tablet by mouth daily. 06/16/13  Yes Elease Etienne, MD  ondansetron (ZOFRAN) 4 MG tablet Take 1 tablet (4 mg total) by mouth every 8 (eight) hours as needed for nausea. 06/16/13  Yes Elease Etienne, MD  pantoprazole (PROTONIX) 40 MG tablet Take 1 tablet (40 mg total) by mouth daily. 06/16/13  Yes Elease Etienne, MD  warfarin (COUMADIN) 2.5 MG tablet Take 1 tablet (2.5 mg total) by mouth daily at 6 PM. 06/16/13  Yes Elease Etienne, MD    Current Facility-Administered Medications  Medication Dose Route Frequency Provider Last Rate Last Dose  . 0.9 %  sodium chloride infusion  100 mL Intravenous PRN Lauris Poag, MD      . 0.9 %  sodium chloride infusion  100 mL Intravenous PRN Lauris Poag, MD      . acetaminophen (TYLENOL) tablet 650 mg  650 mg Oral Q6H PRN Linward Headland, MD       Or  . acetaminophen (TYLENOL) suppository 650 mg  650 mg Rectal Q6H PRN Linward Headland, MD      . alteplase (CATHFLO ACTIVASE) injection 2 mg  2 mg Intracatheter Once PRN Lauris Poag, MD      . atorvastatin (LIPITOR) tablet 20 mg  20 mg Oral q1800 Linward Headland, MD      . calcium acetate (PHOSLO) capsule 667 mg  667 mg Oral TID WC Linward Headland, MD      . diltiazem (CARDIZEM CD) 24 hr capsule 360 mg  360 mg Oral Daily Inez Catalina, MD   360 mg at 06/30/13 1321  . feeding supplement (NEPRO CARB STEADY) liquid 237 mL  237 mL Oral PRN Lauris Poag, MD      . heparin injection 1,000 Units   1,000 Units Dialysis PRN Lauris Poag, MD      . Melene Muller ON 07/01/2013] levothyroxine (SYNTHROID, LEVOTHROID) tablet 100 mcg  100 mcg Oral QAC breakfast Linward Headland, MD      . lidocaine (PF) (XYLOCAINE) 1 % injection 5 mL  5 mL Intradermal PRN Lauris Poag, MD      . lidocaine-prilocaine (EMLA) cream 1 application  1 application Topical PRN Lauris Poag, MD      .  meclizine (ANTIVERT) tablet 25 mg  25 mg Oral TID PRN Linward Headland, MD      . multivitamin (RENA-VIT) tablet 1 tablet  1 tablet Oral QHS Linward Headland, MD      . ondansetron [facility]) tablet 4 mg  4 mg Oral Q6H PRN Linward Headland, MD       Or  . ondansetron [facility]) injection 4 mg  4 mg Intravenous Q6H PRN Linward Headland, MD   4 mg at 06/30/13 1101  . pantoprazole (PROTONIX) EC tablet 40 mg  40 mg Oral Daily Linward Headland, MD   40 mg at 06/30/13 1318  . pentafluoroprop-tetrafluoroeth (GEBAUERS) aerosol 1 application  1 application Topical PRN Lauris Poag, MD      . sodium chloride 0.9 % injection 3 mL  3 mL Intravenous Q12H Linward Headland, MD      . warfarin (COUMADIN) tablet 5 mg  5 mg Oral ONCE-1800 Inez Catalina, MD      . Warfarin - Pharmacist Dosing Inpatient   Does not apply [id number] Inez Catalina, MD        Allergies as of 06/30/2013 - Review Complete 06/30/2013  Allergen Reaction Noted  . Heparin  05/29/2013    History reviewed. No pertinent family history.  History   Social History  . Marital Status: Married    Spouse Name: N/A    Number of Children: N/A  . Years of Education: N/A   Occupational History  . Not on file.   Social History Main Topics  . Smoking status: Former Smoker -- 1.00 packs/day for 47 years    Quit date: 11/05/2000  . Smokeless tobacco: Never Used  . Alcohol Use: No     Comment: Previously drank 6 beers/day, quit in 1994  . Drug Use: No  . Sexual Activity: Not on file   Other Topics Concern  . Not on file   Social History Narrative  . No narrative on file    Review of Systems:  Pertinent for weight loss and previous history of reflux symptoms which have resolved on PPI therapy.  Physical Exam: Vital signs in last 24 hours: Temp:  [97.5 F (36.4 C)-97.8 F (36.6 C)] 97.6 F (36.4 C) (08/26 1649) Pulse Rate:  [95-130] 128 (08/26 1830) Resp:  [16-34] 20 (08/26 1830) BP: (89-130)/(65-88) 98/69 mmHg (08/26 1830) SpO2:  [95 %-100 %] 96 % (08/26 1800) Weight:  [91.2 kg (201 lb 1 oz)] 91.2 kg (201 lb 1 oz) (08/26 1649) Last BM Date: 06/29/13 General:   Alert,  Well-developed, well-nourished, pleasant and cooperative in NAD.[name] The patient does not appear in all cachectic. Head:  Normocephalic and atraumatic. Eyes:  Sclera clear, no icterus. Mouth:   No ulcerations or lesions.  Oropharynx somewhat erythematous posteriorly. Neck:   No masses or thyromegaly. Lungs:  Clear throughout to auscultation.   No wheezes, crackles, or rhonchi. No evident respiratory distress. Heart:   Irregular rhythm; possible soft murmur. Abdomen:  Soft, nontender, nontympanitic, and nondistended. No masses, hepatosplenomegaly or ventral hernias noted.    Neurologic:  Alert and coherent;  grossly normal neurologically. Cervical Nodes:  No significant cervical adenopathy. Psych:   Alert and cooperative. Normal mood and affect.  Intake/Output from previous day:   Intake/Output this shift:    Lab Results:  Recent Labs  06/30/13 0742  WBC 9.3  HGB 12.1*  HCT 39.0  PLT 253   BMET  Recent Labs  06/30/13 0742  NA 137  K 4.6  CL 100  CO2 23  GLUCOSE 90  BUN 22  CREATININE 6.75*  CALCIUM 9.0   LFT No results found for this basename: PROT, ALBUMIN, AST, ALT, ALKPHOS, BILITOT, BILIDIR, IBILI,  in the last 72 hours PT/INR  Recent Labs  06/29/13 06/30/13 0742  LABPROT  --  19.2*  INR 2.1 1.67*    Studies/Results: Dg Neck Soft Tissue  06/30/2013   *RADIOLOGY REPORT*  Clinical Data: Nausea, dysphasia  NECK SOFT TISSUES - 1+ VIEW  Comparison: None.  Findings: Lateral neck  soft tissues are unremarkable.  No prevertebral soft tissue swelling.  Degenerative changes of the visualized cervical spine.  The patient is edentulous.  IMPRESSION: Unremarkable lateral neck soft tissues.   Original Report Authenticated By: Charline Bills, M.D.   Dg Chest 2 View  06/30/2013   *RADIOLOGY REPORT*  Clinical Data: Dysphasia, nausea  CHEST - 2 VIEW  Comparison: 06/24/2013  Findings: Bilateral lower lobe scarring and/or atelectasis. No pleural effusion or pneumothorax.  The heart is normal in size. Postsurgical changes related to prior CABG.  Stable right subclavian dual lumen central venous catheter.  IMPRESSION: No evidence of acute cardiopulmonary disease.   Original Report Authenticated By: Charline Bills, M.D.   Ct Head Wo Contrast  06/30/2013   CLINICAL DATA:  Difficulty swallowing. Frontal headache.  EXAM: CT HEAD WITHOUT CONTRAST  TECHNIQUE: Contiguous axial images were obtained from the base of the skull through the vertex without intravenous contrast.  COMPARISON:  MRI 03/01/2011.  FINDINGS: Mild age related volume loss. No acute intracranial abnormality. Specifically, no hemorrhage, hydrocephalus, mass lesion, acute infarction, or significant intracranial injury. No acute calvarial abnormality. Visualized paranasal sinuses and mastoids clear. Orbital soft tissues unremarkable.  IMPRESSION: No acute intracranial abnormality.   Electronically Signed   By: Charlett Nose   On: 06/30/2013 08:41    Impression: Esophageal dysphagia related to distal esophageal stricture which appears benign.  Plan: I reviewed the nature, purpose, and risks of endoscopy with esophageal dilatation with the patient. I think the procedure could be done safely, if he were taken off Coumadin.  However, after mentioning the remote (roughly 1 in a 1000) risk of esophageal perforation (which, in his case, could well be fatal, keeping in mind his post CABG clinical course), he has decided to try to do  without esophageal dilatation.   I will increase his Protonix to twice a day, with the hope that that might bring about some improvement in his dysphagia symptoms.   He will probably have to get by with a soft diet, and he is okay with that. (Meat gets stuck in his esophagus, even when it has been chopped into very small pieces.)   He understands that I would be happy to follow him up as an outpatient to monitor his symptom evolution on the higher dose of PPI, and if he gets worse or shows no improvement or simply becomes tired of adhering to a soft diet, we could always reconsider the option of endoscopic esophageal dilatation.   LOS: 0 days   Jaidyn Usery V  06/30/2013, 6:41 PM

## 2013-06-30 NOTE — Evaluation (Signed)
Clinical/Bedside Swallow Evaluation Patient Details  Name: [name] MRN: [id number] Date of Birth: 01-09-1934  Today's Date: 06/30/2013 Time: 7829-5621 SLP Time Calculation (min): 19 min  Past Medical History:  Past Medical History  Diagnosis Date  . Hypertension   . Depression   . 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 Coronary artery disease   . ESRD on dialysis 06/2013    TTS HD in [place]  . Myocardial infarction 05/2013    NSTEMI, s/p CABG 05/13/13  . Peripheral arterial disease   . Atrial flutter   . DVT (deep venous thrombosis) 02/2011    LLL  . GERD (gastroesophageal reflux disease)   . Compression fracture of cervical spine   . Rheumatoid arthritis   . Prostate cancer   . Stasis dermatitis     LLL, wears TED hose  . AAA (abdominal aortic aneurysm)   . 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 CHF (congestive heart failure)     EF 45-55% per echo 05/28/13  . Hyperlipidemia   . Dysphagia 05/2013    after prolonged intubation  . Stroke    Past Surgical History:  Past Surgical History  Procedure Laterality Date  . Eye surgery    . Coronary artery bypass graft N/A 05/13/2013    Procedure: CORONARY ARTERY BYPASS GRAFTING (CABG);  Surgeon: Alleen Borne, MD;  Location: [facility] OR;  Service: Open Heart Surgery;  Laterality: N/A;  . Insertion of dialysis catheter N/A 05/26/2013    Procedure: INSERTION OF DIALYSIS CATHETER;  Surgeon: Sherren Kerns, MD;  Location[facility] OR;  Service: Vascular;  Laterality: N/A;  right IJ  . Cardiac surgery    . Cataract extraction     HPI:  The patient is a 79 yo man, history of NSTEMI s/p CABG (05/13/13), HTN, paroxysmal afib, presenting with dysphagia.  The patient notes dysphagia, present since CABG 05/13/13, which has been unchanged since that time.  He describes this as a sensation of solid food "getting caught" in the back of his throat, with associated nausea, but no regurgitation, vomiting, or coughing.  He has been able to tolerate  liquids and soft foods (creamed potatoes, noodles) without difficulty.  Since symptoms have not improved, the patient presented for evaluation.  He notes no chest pain, palpitations, cough, SOB, congestion, rhinorrhea, sore throat, fevers, chills, or abdominal pain.   Assessment / Plan / Recommendation Clinical Impression  Pt presents with subjective c/o globus sensation and difficulty with solid POs. Pt consumed thin liquids and pureed solids with no overt s/s of aspiration, however appeared to be utilizing increased effort to initiate bolus transit. Pt does have a h/o transient oropharyngeal dysphagia s/p intubation in July this year, with sensed penetration of thins, returning to his baseline swallowing function prior to discharge (Dys. 3 and thin liquids). Given baseline oropharyngeal swallow at recent d/c from inpatient rehab, eructation through PO consumption, and subjective c/o globus and dysphagia to solids, suspect a primary esophageal dysphagia. Pt was transported to Radiology for barium swallow at the conclusion of this evaluation. Recommend that MD advance diet as tolerated by pt, pending barium swallow and GI work-up. Given h/o brief oropharyngeal dysphagia and limited PO intake today, recommend speech f/u upon diet initiation to assess tolerance.    Aspiration Risk  Mild    Diet Recommendation Dysphagia 3 (Mechanical Soft);Thin liquid (advance as tolerated pending esophageal w/u)   Liquid Administration via: Cup;Straw;No straw Medication Administration: Whole  meds with puree Supervision: Patient able to self feed;Intermittent supervision to cue for compensatory strategies Compensations: Small sips/bites;Slow rate;Follow solids with liquid Postural Changes and/or Swallow Maneuvers: Seated upright 90 degrees;Upright 30-60 min after meal    Other  Recommendations Recommended Consults: Consider GI evaluation;Consider esophageal assessment Oral Care Recommendations: Oral care BID   Follow  Up Recommendations  None    Frequency and Duration min 2x/week  2 weeks   Pertinent Vitals/Pain N/A    SLP Swallow Goals Patient will consume recommended diet without observed clinical signs of aspiration with: Modified independent assistance Patient will utilize recommended strategies during swallow to increase swallowing safety with: Modified independent assistance   Swallow Study Prior Functional Status       General Date of Onset: 06/30/13 HPI: The patient is a 79 yo man, history of NSTEMI s/p CABG (05/13/13), HTN, paroxysmal afib, presenting with dysphagia.  The patient notes dysphagia, present since CABG 05/13/13, which has been unchanged since that time.  He describes this as a sensation of solid food "getting caught" in the back of his throat, with associated nausea, but no regurgitation, vomiting, or coughing.  He has been able to tolerate liquids and soft foods (creamed potatoes, noodles) without difficulty.  Since symptoms have not improved, the patient presented for evaluation.  He notes no chest pain, palpitations, cough, SOB, congestion, rhinorrhea, sore throat, fevers, chills, or abdominal pain. Type of Study: Bedside swallow evaluation Previous Swallow Assessment: FEES 7/18 Diet Prior to this Study: NPO Temperature Spikes Noted: No Respiratory Status: Room air History of Recent Intubation: No Behavior/Cognition: Alert;Cooperative;Pleasant mood Oral Cavity - Dentition: Edentulous Self-Feeding Abilities: Able to feed self Patient Positioning: Other (comment) (sitting upright EOB) Baseline Vocal Quality: Clear Volitional Cough: Strong Volitional Swallow: Able to elicit    Oral/Motor/Sensory Function Overall Oral Motor/Sensory Function: Appears within functional limits for tasks assessed   Ice Chips Ice chips: Not tested   Thin Liquid Thin Liquid: Impaired Presentation: Cup;Self Fed;Straw;Spoon Pharyngeal  Phase Impairments: Other (comments) (noted significant effort in  swallowing)    Nectar Thick Nectar Thick Liquid: Not tested   Honey Thick Honey Thick Liquid: Not tested   Puree Puree: Impaired Presentation: Self Fed;Spoon Pharyngeal Phase Impairments: Other (comments) (noted significant effort in swallowing)   Solid   GO Functional Assessment Tool Used: skilled clinical judgment Functional Limitations: Swallowing Swallow Current Status ([id number]): At least 20 percent but less than 40 percent impaired, limited or restricted Swallow Goal Status [phone]): At least 1 percent but less than 20 percent impaired, limited or restricted  Solid: Not tested (pt declined, said he could not chew/swallow)       Maxcine Ham 06/30/2013,4:22 PM  Maxcine Ham, M.A. CCC-SLP

## 2013-06-30 NOTE — Progress Notes (Signed)
[facility]  Patient Status: Active (receiving services up to time of hospitalization)  [facility] is providing the following services: RN, PT and ST  If patient discharges after hours, please call [phone].   [name] 06/30/2013, 5:08 PM

## 2013-06-30 NOTE — ED Notes (Signed)
Pt states that he has been having trouble swallowing since his surgery in July. Pt stated that the dysphagia has gotten worse this morning, he reports not being able to swallow food or water.

## 2013-06-30 NOTE — H&P (Signed)
Date: 06/30/2013               Patient Name:  [name] MRN: [id number]  DOB: [birth year] Age / Sex: 79 y.o., male   PCP: Toma Deiters, MD         Medical Service: Internal Medicine Teaching Service         Attending Physician: Dr. Debe Coder    First Contact: Dr. Glendell Docker Pager: [phone]  Second Contact: Dr. Dierdre Searles Pager: [phone]       After Hours (After 5p/  First Contact Pager: [phone]  weekends / holidays): Second Contact Pager: [phone]   Chief Complaint: Chronic dysphagia  History of Present Illness:  The patient is a 79 yo man, history of NSTEMI s/p CABG (05/13/13), HTN, paroxysmal afib, presenting with dysphagia.  The patient notes dysphagia, present since CABG 05/13/13, which has been unchanged since that time.  He describes this as a sensation of solid food "getting caught" in the back of his throat, with associated nausea, but no regurgitation, vomiting, or coughing.  He has been able to tolerate liquids and soft foods (creamed potatoes, noodles) without difficulty.  Since symptoms have not improved, the patient presented for evaluation.  He notes no chest pain, palpitations, cough, SOB, congestion, rhinorrhea, sore throat, fevers, chills, or abdominal pain.  The patient was hospitalized 7/8-7/29 with NSTEMI, with cardiac cath showing severe 4-vessel disease, requiring CABG x4 on 7/9.  That hospital course was complicated by cardiogenic shock, leading to ventilator-dependent respiratory failure, as well as discovery of new afib/aflutter, with initiation of Coumadin.  Echo 7/24 showed an EF 45-50%.  The patient also experienced acute on chronic renal failure that admission, requiring HD.  The patient was discharged to inpatient rehab, but was readmitted 8/8 due to worsening renal failure, requiring initiation of permanent HD.  The patient underwent the CLIP process, and was discharged 8/12.  He subsequently presented to his Cardiothoracic surgeon for follow-up 8/20, who noted him  to be in aflutter with HR 130-150, and referred him to the ED, where he was evaluated, and thought to be mildly volume depleted.  The patient presents today, still in atrial flutter, though asymptomatic, with BP in the 110-120's.  Meds: No current facility-administered medications for this encounter.   Current Outpatient Prescriptions  Medication Sig Dispense Refill  . acetaminophen (TYLENOL) 325 MG tablet Take 1-2 tablets (325-650 mg total) by mouth every 6 (six) hours as needed for pain.      [name] atorvastatin (LIPITOR) 20 MG tablet Take 1 tablet (20 mg total) by mouth daily at 6 PM.  30 tablet  0  . calcium acetate (PHOSLO) 667 MG capsule Take 1 capsule (667 mg total) by mouth 3 (three) times daily with meals.  90 capsule  0  . diltiazem (TIAZAC) 360 MG 24 hr capsule Take 360 mg by mouth daily.      . feeding supplement (RESOURCE BREEZE) LIQD Take 1 Container by mouth 3 (three) times daily between meals.      [name] levothyroxine (SYNTHROID, LEVOTHROID) 100 MCG tablet Take 1 tablet (100 mcg total) by mouth daily before breakfast.  30 tablet  0  . meclizine (ANTIVERT) 25 MG tablet Take 25 mg by mouth 3 (three) times daily.      . multivitamin (RENA-VIT) TABS tablet Take 1 tablet by mouth daily.      . ondansetron (ZOFRAN) 4 MG tablet Take 1 tablet (4 mg total) by mouth every 8 (eight) hours as needed for  nausea.  20 tablet  0  . pantoprazole (PROTONIX) 40 MG tablet Take 1 tablet (40 mg total) by mouth daily.  30 tablet  0  . warfarin (COUMADIN) 2.5 MG tablet Take 1 tablet (2.5 mg total) by mouth daily at 6 PM.  30 tablet  0    Allergies: Allergies as of 06/30/2013 - Review Complete 06/30/2013  Allergen Reaction Noted  . 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 Hypertension   . Depression   . 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 Coronary artery disease   . ESRD on dialysis 06/2013    TTS HD in [place]  . Myocardial infarction 05/2013    NSTEMI,  s/p CABG 05/13/13  . Peripheral arterial disease   . Atrial flutter   . DVT (deep venous thrombosis) 02/2011    LLL  . GERD (gastroesophageal reflux disease)   . Compression fracture of cervical spine   . Rheumatoid arthritis   . Prostate cancer   . Stasis dermatitis     LLL, wears TED hose  . AAA (abdominal aortic aneurysm)   . 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 CHF (congestive heart failure)     EF 45-55% per echo 05/28/13  . Hyperlipidemia   . Dysphagia 05/2013    after prolonged intubation  . Stroke    Past Surgical History  Procedure Laterality Date  . Eye surgery    . Coronary artery bypass graft N/A 05/13/2013    Procedure: CORONARY ARTERY BYPASS GRAFTING (CABG);  Surgeon: Alleen Borne, MD;  Location: [facility] OR;  Service: Open Heart Surgery;  Laterality: N/A;  . Insertion of dialysis catheter N/A 05/26/2013    Procedure: INSERTION OF DIALYSIS CATHETER;  Surgeon: Sherren Kerns, MD;  Location: [facility] OR;  Service: Vascular;  Laterality: N/A;  right IJ  . Cardiac surgery    . Cataract extraction     History reviewed. No pertinent family history. History   Social History  . Marital Status: Married    Spouse Name: N/A    Number of Children: N/A  . Years of Education: N/A   Occupational History  . Not on file.   Social History Main Topics  . Smoking status: Former Smoker -- 1.00 packs/day for 47 years    Quit date: 11/05/2000  . Smokeless tobacco: Not on file  . Alcohol Use: No     Comment: Previously drank 6 beers/day, quit in 1994  . Drug Use: No  . Sexual Activity: Not on file   Other Topics Concern  . Not on file   Social History Narrative  . No narrative on file    Review of Systems: General: no fevers, chills Skin: no rash HEENT: no blurry vision, hearing changes, sore throat Pulm: no dyspnea, coughing, wheezing CV: +occasional palpitations, though denies presently, no chest pain Abd: no abdominal pain, nausea/vomiting, diarrhea/constipation GU: no  dysuria, hematuria, polyuria Ext: no arthralgias, myalgias Neuro: no weakness, numbness, or tingling  Physical Exam: Blood pressure 115/74, pulse 120, temperature 97.5 F (36.4 C), temperature source Oral, resp. rate 18, SpO2 98.00%. General: alert, cooperative, and in no apparent distress HEENT: PERRL, EOMI, oropharynx non-erythematous with no obvious abnormalities Neck: supple, no lymphadenopathy Lungs: normal work of respiration, mild crackles at bilateral bases Heart: tachycardic, regular rhythm, no m/g/r Abdomen: soft, mild tenderness felt at site of sternotomy scar with epigastric palpation, non-distended, +bs,  no guarding or rebound tenderness Extremities: no cyanosis, clubbing, or edema, compression stockings in place Neurologic: alert & oriented X3, cranial nerves II-XII intact, strength grossly intact, sensation intact to light touch  Historical weights 8/20: 198 lbs 8/12: 203 lbs 8/8: 204 lbs 7/20: 218 lbs 3/3: 235 lbs  Lab results: Basic Metabolic Panel:  Recent Labs  57/84/69 0742  NA 137  K 4.6  CL 100  CO2 23  GLUCOSE 90  BUN 22  CREATININE 6.75*  CALCIUM 9.0   CBC:  Recent Labs  06/30/13 0742  WBC 9.3  NEUTROABS 3.3  HGB 12.1*  HCT 39.0  MCV 88.2  PLT 253   Cardiac Enzymes:  Recent Labs  06/30/13 0806  TROPONINI <0.30   TSH: 6.57 (06/24/13)  Coagulation:  Recent Labs  06/29/13 06/30/13 0742  LABPROT  --  19.2*  INR 2.1 1.67*    Imaging results:  Dg Neck Soft Tissue  06/30/2013   *RADIOLOGY REPORT*  Clinical Data: Nausea, dysphasia  NECK SOFT TISSUES - 1+ VIEW  Comparison: None.  Findings: Lateral neck soft tissues are unremarkable.  No prevertebral soft tissue swelling.  Degenerative changes of the visualized cervical spine.  The patient is edentulous.  IMPRESSION: Unremarkable lateral neck soft tissues.   Original Report Authenticated By: Charline Bills, M.D.   Dg Chest 2 View  06/30/2013   *RADIOLOGY REPORT*  Clinical Data:  Dysphasia, nausea  CHEST - 2 VIEW  Comparison: 06/24/2013  Findings: Bilateral lower lobe scarring and/or atelectasis. No pleural effusion or pneumothorax.  The heart is normal in size. Postsurgical changes related to prior CABG.  Stable right subclavian dual lumen central venous catheter.  IMPRESSION: No evidence of acute cardiopulmonary disease.   Original Report Authenticated By: Charline Bills, M.D.   Ct Head Wo Contrast  06/30/2013   CLINICAL DATA:  Difficulty swallowing. Frontal headache.  EXAM: CT HEAD WITHOUT CONTRAST  TECHNIQUE: Contiguous axial images were obtained from the base of the skull through the vertex without intravenous contrast.  COMPARISON:  MRI 03/01/2011.  FINDINGS: Mild age related volume loss. No acute intracranial abnormality. Specifically, no hemorrhage, hydrocephalus, mass lesion, acute infarction, or significant intracranial injury. No acute calvarial abnormality. Visualized paranasal sinuses and mastoids clear. Orbital soft tissues unremarkable.  IMPRESSION: No acute intracranial abnormality.   Electronically Signed   By: Charlett Nose   On: 06/30/2013 08:41    Other results: EKG: atrial flutter, HR in 120's, no acute ST abnormalities  Assessment & Plan by Problem: The patient is a 79 yo man, history of recent CABG, and dysphagia x2 months, presenting with chronic dysphagia.  # Chronic dysphagia - the patient notes a 51-month history of dysphagia, since intubation and VDRF in July 2014.  Chart review of SLP notes reveals patient initially had signs of aspiration on swallow 7/18 (made NPO), with fiberoptic endoscopic evaluation 7/18 showing mild oral and pharyngeal phase dysphagia (adv to dys 3 w/nectar thick).  By 7/23, dysphagia had improved somewhat (adv to dys 3 w/thin), and by re-evaluation 7/25 pt had no signs of aspiration (adv to regular w/thin).  Chart review reveals weight loss over the last few months (was 218 on 7/30, now 198 on 8/20).  Given description of "food  getting stuck" in his throat, I'm more concerned about an esophageal cause of dysphagia, such as esophageal stricture/ring vs motility disorder vs esophagitis -consult SLP for re-evaluation of dysphagia  -will consult GI for consideration for inpatient vs outpatient EGD  # Atrial Flutter - the  patient was noted to have afib/aflutter during his hospitalization 05/2013, started on coumadin and diltiazem.  Patient currently in aflutter with HR in 120's, asymptomatic.  INR = 1.67 8/26 (was 2.1 8/25) -coumadin per pharmacy -continue dilt at 360 mg XR daily.  If HR remains above 110 on this medication, consider starting beta blocker  # ESRD - on HD intermittently since 05/2013, permanently since 06/2013.  Pt has TTS HD in [place] (last HD 8/23) -Nephrology consulted, appreciate recs  # Suicidal Ideation - after my examination, the patient noted SI to the nurse. -place on suicide precautions, with 1:1 sitter -consulted psych  # Hypothyroidism - started on synthroid 05/27/2013 due to an elevated TSH.  Recheck of TSH 8/20 was 6.57, though it can take up to 6 weeks to see improvement in TSH. -continue synthroid 100 mcg daily for now  # DVT prophylaxis - Questionable history of HIT.  Will use SCD's for DVT prophy. INR will likely be therapeutic in the next 1-2 days as well.  Dispo: Disposition is deferred at this time, awaiting improvement of current medical problems. Anticipated discharge in approximately 1-2 day(s).   The patient does have a current PCP (Toma Deiters, MD) and does not need an [facility] hospital follow-up appointment after discharge.  Signed: Linward Headland, MD 06/30/2013, 10:39 AM

## 2013-07-01 ENCOUNTER — Other Ambulatory Visit: Payer: Self-pay | Admitting: *Deleted

## 2013-07-01 DIAGNOSIS — N186 End stage renal disease: Secondary | ICD-10-CM

## 2013-07-01 DIAGNOSIS — F39 Unspecified mood [affective] disorder: Secondary | ICD-10-CM

## 2013-07-01 DIAGNOSIS — Z01810 Encounter for preprocedural cardiovascular examination: Secondary | ICD-10-CM

## 2013-07-01 MED ORDER — SODIUM CHLORIDE 0.9 % IV BOLUS (SEPSIS)
250.0000 mL | Freq: Once | INTRAVENOUS | Status: AC
  Administered 2013-07-01: 250 mL via INTRAVENOUS

## 2013-07-01 NOTE — Progress Notes (Signed)
Assessment:  1 ESRD  2 Aflutter   3 Dysphagia due to distal esophageal stricture 4 Depression  5 ? Heparin allergy  Plan:  1 HD 3x per week TTS 2 Avoid heparin 3 I have spoken to Dr. Darrick Penna about arranging permanent AV access(pt does not need to stay in hospital for this)  Subjective: Interval History: Tolerated HD yesterday  Objective: Vital signs in last 24 hours: Temp:  [97.5 F (36.4 C)-97.9 F (36.6 C)] 97.9 F (36.6 C) (08/27 0535) Pulse Rate:  [77-130] 77 (08/27 0535) Resp:  [16-34] 20 (08/27 0535) BP: (82-129)/(60-88) 87/60 mmHg (08/27 0535) SpO2:  [95 %-100 %] 95 % (08/27 0535) Weight:  [84.6 kg (186 lb 8.2 oz)-91.2 kg (201 lb 1 oz)] 84.602 kg (186 lb 8.2 oz) (08/26 2152) Weight change:   Intake/Output from previous day: 08/26 0701 - 08/27 0700 In: 0  Out: 956  Intake/Output this shift:    General appearance: alert and cooperative Resp: clear to auscultation bilaterally Chest wall: no tenderness, catheter right chest Cardio: regular rate and rhythm, S1, S2 normal, no murmur, click, rub or gallop Extremities: extremities normal, atraumatic, no cyanosis or edema  Lab Results:  Recent Labs  06/30/13 0742  WBC 9.3  HGB 12.1*  HCT 39.0  PLT 253   BMET:  Recent Labs  06/30/13 0742  NA 137  K 4.6  CL 100  CO2 23  GLUCOSE 90  BUN 22  CREATININE 6.75*  CALCIUM 9.0   No results found for this basename: PTH,  in the last 72 hours Iron Studies: No results found for this basename: IRON, TIBC, TRANSFERRIN, FERRITIN,  in the last 72 hours Studies/Results: Dg Neck Soft Tissue  06/30/2013   *RADIOLOGY REPORT*  Clinical Data: Nausea, dysphasia  NECK SOFT TISSUES - 1+ VIEW  Comparison: None.  Findings: Lateral neck soft tissues are unremarkable.  No prevertebral soft tissue swelling.  Degenerative changes of the visualized cervical spine.  The patient is edentulous.  IMPRESSION: Unremarkable lateral neck soft tissues.   Original Report Authenticated By: Charline Bills, M.D.   Dg Chest 2 View  06/30/2013   *RADIOLOGY REPORT*  Clinical Data: Dysphasia, nausea  CHEST - 2 VIEW  Comparison: 06/24/2013  Findings: Bilateral lower lobe scarring and/or atelectasis. No pleural effusion or pneumothorax.  The heart is normal in size. Postsurgical changes related to prior CABG.  Stable right subclavian dual lumen central venous catheter.  IMPRESSION: No evidence of acute cardiopulmonary disease.   Original Report Authenticated By: Charline Bills, M.D.   Ct Head Wo Contrast  06/30/2013   CLINICAL DATA:  Difficulty swallowing. Frontal headache.  EXAM: CT HEAD WITHOUT CONTRAST  TECHNIQUE: Contiguous axial images were obtained from the base of the skull through the vertex without intravenous contrast.  COMPARISON:  MRI 03/01/2011.  FINDINGS: Mild age related volume loss. No acute intracranial abnormality. Specifically, no hemorrhage, hydrocephalus, mass lesion, acute infarction, or significant intracranial injury. No acute calvarial abnormality. Visualized paranasal sinuses and mastoids clear. Orbital soft tissues unremarkable.  IMPRESSION: No acute intracranial abnormality.   Electronically Signed   By: Charlett Nose   On: 06/30/2013 08:41   Scheduled: . atorvastatin  20 mg Oral q1800  . calcium acetate  667 mg Oral TID WC  . diltiazem  360 mg Oral Daily  . levothyroxine  100 mcg Oral QAC breakfast  . multivitamin  1 tablet Oral QHS  . pantoprazole  40 mg Oral BID AC  . sodium chloride  3 mL Intravenous  Q12H  . Warfarin - Pharmacist Dosing Inpatient   Does not apply q1800     LOS: 1 day   [name] C 07/01/2013,7:34 AM

## 2013-07-01 NOTE — H&P (Signed)
IM ATTENDING ADMISSION H&P  Date: 07/01/2013  Patient name: [name]  Medical record number: [id number]  Date of birth: 1933/12/14   This patient has been seen and the plan of care was discussed with the house staff. Please see their note for complete details. I concur with their findings with the following additions/corrections:  I have seen [name] and reviewed the medical history.  Briefly, [name] is a 79yo man with PMH of NSTEMI s/p CABG in July, HTN, pAfib who presents with dysphagia.  He reports the symptoms started after his CABG and have been unchanged since that time.  He reports a sensation of food getting caught in his throat and this is worse with solid food but not present with soft food or liquid.  He denied regurgitation, emesis or cough.  He does note occasional nausea.  No further symptoms.  In the ED, he reports being "annoyed" with all the questions and just started saying yet to everything.  He reported SI and a plan at that time and has been placed on observation with a  Sitter.  This morning he denies SI or HI and reports only being annoyed yesterday.  GI has been consulted for dysphagia, SLP consultation.  For his other medical problems, they appear stable, please refer to resident note.   Inez Catalina, MD 07/01/2013, 12:52 PM

## 2013-07-01 NOTE — Consult Note (Addendum)
[facility] Consultation Reason for consult: hemodialysis access Requesting: Dr Lowell Guitar History of Present Illness:  Patient is a 79 y.o. year old male who presents for evaluation for permanent hemodialysis access.  Other medical problems include hypertension, coronary artery disease and atrial flutter.  He dialyzes T Th Sat in [place].  He is on coumadin.  Past Medical History  Diagnosis Date  . Hypertension   . Depression   . 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 Coronary artery disease   . ESRD on dialysis 06/2013    TTS HD in [place]  . Myocardial infarction 05/2013    NSTEMI, s/p CABG 05/13/13  . Peripheral arterial disease   . Atrial flutter   . DVT (deep venous thrombosis) 02/2011    LLL  . GERD (gastroesophageal reflux disease)   . Compression fracture of cervical spine   . Rheumatoid arthritis   . Prostate cancer   . Stasis dermatitis     LLL, wears TED hose  . AAA (abdominal aortic aneurysm)   . 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 CHF (congestive heart failure)     EF 45-55% per echo 05/28/13  . Hyperlipidemia   . Dysphagia 05/2013    after prolonged intubation  . Stroke     Past Surgical History  Procedure Laterality Date  . Eye surgery    . Coronary artery bypass graft N/A 05/13/2013    Procedure: CORONARY ARTERY BYPASS GRAFTING (CABG);  Surgeon: Alleen Borne, MD;  Location: [facility] OR;  Service: Open Heart Surgery;  Laterality: N/A;  . Insertion of dialysis catheter N/A 05/26/2013    Procedure: INSERTION OF DIALYSIS CATHETER;  Surgeon: Sherren Kerns, MD;  Location: [facility] OR;  Service: Vascular;  Laterality: N/A;  right IJ  . Cardiac surgery    . Cataract extraction       Social History History  Substance Use Topics  . Smoking status: Former Smoker -- 1.00 packs/day for 47 years    Quit date: 11/05/2000  . Smokeless tobacco: Never Used  . Alcohol Use: No     Comment: Previously drank 6 beers/day, quit in 1994    Family  History Mother coronary artery disease  Allergies  Allergies  Allergen Reactions  . 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 0.9 %  sodium chloride infusion  100 mL Intravenous PRN Lauris Poag, MD      . 0.9 %  sodium chloride infusion  100 mL Intravenous PRN Lauris Poag, MD      . acetaminophen (TYLENOL) tablet 650 mg  650 mg Oral Q6H PRN Linward Headland, MD       Or  . acetaminophen (TYLENOL) suppository 650 mg  650 mg Rectal Q6H PRN Linward Headland, MD      . atorvastatin (LIPITOR) tablet 20 mg  20 mg Oral q1800 Linward Headland, MD   20 mg at 07/01/13 0146  . calcium acetate (PHOSLO) capsule 667 mg  667 mg Oral TID WC Linward Headland, MD   667 mg at 07/01/13 1220  . diltiazem (CARDIZEM CD) 24 hr capsule 360 mg  360 mg Oral Daily Inez Catalina, MD   360 mg at 07/01/13 1200  . feeding supplement (NEPRO CARB STEADY) liquid 237 mL  237 mL Oral  PRN Lauris Poag, MD      . heparin injection 1,000 Units  1,000 Units Dialysis PRN Lauris Poag, MD      . levothyroxine (SYNTHROID, LEVOTHROID) tablet 100 mcg  100 mcg Oral QAC breakfast Linward Headland, MD      . lidocaine (PF) (XYLOCAINE) 1 % injection 5 mL  5 mL Intradermal PRN Lauris Poag, MD      . lidocaine-prilocaine (EMLA) cream 1 application  1 application Topical PRN Lauris Poag, MD      . meclizine (ANTIVERT) tablet 25 mg  25 mg Oral TID PRN Linward Headland, MD      . multivitamin (RENA-VIT) tablet 1 tablet  1 tablet Oral QHS Linward Headland, MD   1 tablet at 07/01/13 0146  . ondansetron (ZOFRAN) tablet 4 mg  4 mg Oral Q6H PRN Linward Headland, MD       Or  . ondansetron [facility]) injection 4 mg  4 mg Intravenous Q6H PRN Linward Headland, MD   4 mg at 06/30/13 1101  . pantoprazole (PROTONIX) EC tablet 40 mg  40 mg Oral BID AC Florencia Reasons, MD   40 mg at 07/01/13 0908  . pentafluoroprop-tetrafluoroeth (GEBAUERS) aerosol 1 application  1  application Topical PRN Lauris Poag, MD      . sodium chloride 0.9 % injection 3 mL  3 mL Intravenous Q12H Linward Headland, MD   3 mL at 07/01/13 1212  . Warfarin - Pharmacist Dosing Inpatient   Does not apply [id number] Inez Catalina, MD        ROS:   General:  No weight loss, Fever, chills  HEENT: No recent headaches, no nasal bleeding, no visual changes, no sore throat  Neurologic: No dizziness, blackouts, seizures. No recent symptoms of stroke or mini- stroke. No recent episodes of slurred speech, or temporary blindness.  Cardiac: No recent episodes of chest pain/pressure, no shortness of breath at rest.  + shortness of breath with exertion.  Denies history of atrial fibrillation or irregular heartbeat  Vascular: No history of rest pain in feet.  No history of claudication.  No history of non-healing ulcer, No history of DVT   Pulmonary: No home oxygen, no productive cough, no hemoptysis,  No asthma or wheezing  Musculoskeletal:  [x ] Arthritis, [ ]  Low back pain,  [ ]  Joint pain  Hematologic:No history of hypercoagulable state.  No history of easy bleeding.  No history of anemia  Gastrointestinal: No hematochezia or melena,  No gastroesophageal reflux, + trouble swallowing dysmotility  Urinary: [x ] chronic Kidney disease, [ ]  on HD - [ ]  MWF or [x ] TTHS, [ ]  Burning with urination, [ ]  Frequent urination, [ ]  Difficulty urinating;   Skin: No rashes  Psychological: No history of anxiety,  + history of depression   Physical Examination  Filed Vitals:   06/30/13 2152 07/01/13 0535 07/01/13 1000 07/01/13 1323  BP: 101/62 87/60 104/75 105/74  Pulse: 128 77 106 119  Temp: 97.6 F (36.4 C) 97.9 F (36.6 C) 98.9 F (37.2 C) 98 F (36.7 C)  TempSrc: Oral Oral Oral Oral  Resp: 20 20 22 20   Height: 6\' 1"  (1.854 m)     Weight: 186 lb 8.2 oz (84.602 kg)     SpO2: 98% 95% 95% 93%    Body mass index is 24.61 kg/(m^2).  General:  Alert and oriented, no acute distress HEENT:  decreased hearing acuity  Neck: No bruit or JVD Pulmonary: Clear to auscultation bilaterally Cardiac: Regular Rate and Rhythm Abdomen: Soft, non-tender Skin: No rash Extremity Pulses:  2+ radial, brachial right side, 2+ left brachial absent left radial pulse Musculoskeletal: No deformity or edema  Neurologic: Upper and lower extremity motor 5/5 and symmetric  DATA: Vein map marginal vein <3 mm bilat   ASSESSMENT: Needs long term hemodialysis access   PLAN:  Left arm AV graft Monday Sept 1.  Procedure risk benefit details discussed.  Pt would prefer MAC anesthesia. Stop coumadin today.  Fabienne Bruns, MD [facility] [place] Office: [phone] Pager: [phone]

## 2013-07-01 NOTE — Progress Notes (Signed)
Pt with B/P of of 87/60 this am. Remains asymptomatic. MD, Dr. Claudell Kyle, paged with call back and made aware. Dondra Spry

## 2013-07-01 NOTE — Progress Notes (Signed)
ADDENDUM TO PREVIOUS NOTE:  Official [facility] report is now available:  It was NOT felt that a stricture was present, but rather, that there was severe dysmotility and esophageal spasm.    This makes the likely benefit of esophageal dilatation even lower, and the importance of eating habits even more important (eg, eating slowly, alternating solid boluses with sips of liquids, and small bolus size).  If patient continues to have severe symptoms, we could consider a calcium channel blocker or even an injection of Botox into the lower esophageal sphincter as we do for achalasia; these might be helpful.  Florencia Reasons, M.D. [phone]

## 2013-07-01 NOTE — Progress Notes (Signed)
Patient was not in his room at the time of my visit, but I did speak to his wife and son. I reviewed with them the conversation I had with the patient yesterday, specifically, that he prefers to try to continue with a soft diet instead of undergoing esophageal dilatation with its attendant (albeit small) risk.  Recommendations were provided regarding small bolus size and eating slowly, and mincing meats such as steak or chicken prior to consumption.  I gave them my card, and encouraged them to contact me if the patient decides that he would like to have esophageal dilatation.  I have increased his proton pump inhibitor to twice a day, but of course it is too early to tell whether that will have an effect on his dysphagia symptoms.  The patient's wife indicates that he has a tendency to get nauseated while on dialysis, but apparently nausea is not a problem at other times. I explained that this is probably a reaction to the dialysis treatment, rather than a primary GI problem, and recommended to discuss it with his nephrologist.  Florencia Reasons, M.D. [phone]

## 2013-07-01 NOTE — Progress Notes (Signed)
SLP Cancellation Note  Patient Details Name: [name] MRN: [id number] DOB: January 24, 1934   Cancelled treatment:       Reason Eval/Treat Not Completed: [name]/unavailable. Will continue to make attempts to see pt for f/u given diet initiation 06/30/13 (Dys. 3 textures and thin liquids).   Maxcine Ham 07/01/2013, 11:05 AM  Maxcine Ham, M.A. CCC-SLP

## 2013-07-01 NOTE — Progress Notes (Signed)
VASCULAR LAB PRELIMINARY  PRELIMINARY  PRELIMINARY  PRELIMINARY  Right  Upper Extremity Vein Map    Cephalic  Segment Diameter Depth Comment  1. Axilla    Too small  2. Mid upper arm    Too small  3. Above AC 1.13mm    4. In [facility]    Not seen  5. Below AC 2.49mm    6. Mid forearm 2mm    7. Wrist 3mm     Basilic  Segment Diameter Depth Comment  1. Axilla 4.37mm    2. Mid upper arm 4.34mm 10.7mm   3. Above [name]    Not seen  4. In [facility]    Not seen  5. Below [facility]    Not seen  6. Mid forearm    Not seen  7. Wrist   Not seen    May possible not be the Basilic could not follow distally.   Left Upper Extremity Vein Map    Cephalic  Segment Diameter Depth Comment  1. Axilla    Not seen  2. Mid upper arm    Not seen  3. Above [facility]    Not seen  4. In [facility]   Not seen  5. Below AC 2.41mm    6. Mid forearm 2.74mm    7. Wrist    Not seen   Basilic  Segment Diameter Depth Comment  1. Axilla    Not seen  2. Mid upper arm 3.81mm 9.30mm   3. Above [facility] 1.81mm 6.77mm  Multiple branches  4. In [facility] 1.68mm 3.47mm   5. Below [facility]    Not seen  6. Mid forearm    Not seen  7. Wrist    Not seen   mm mm    mm mm    mm mm     Tabor Bartram, RVS 07/01/2013, 4:31 PM

## 2013-07-01 NOTE — Consult Note (Signed)
Asked by Dr Lowell Guitar to see pt for permanent hemodialysis access.  Pt currently in Vascular Lab getting vein mapping.  Will return later today to evaluate patient.  Fabienne Bruns, MD [facility] Office: [phone] Pager: [phone]

## 2013-07-01 NOTE — Progress Notes (Signed)
Subjective: NAE overnight. Tolerating feeds. Met with GI, who rec no intervention per the patient's request. Objective: Vital signs in last 24 hours: Filed Vitals:   06/30/13 2106 06/30/13 2115 06/30/13 2152 07/01/13 0535  BP: 94/67 110/72 101/62 87/60  Pulse: 129 127 128 77  Temp:  97.5 F (36.4 C) 97.6 F (36.4 C) 97.9 F (36.6 C)  TempSrc:  Oral Oral Oral  Resp:  22 20 20   Height:   6\' 1"  (1.854 m)   Weight:  186 lb 8.2 oz (84.6 kg) 186 lb 8.2 oz (84.602 kg)   SpO2:  96% 98% 95%   Weight change:   Intake/Output Summary (Last 24 hours) at 07/01/13 1610 Last data filed at 07/01/13 0538  Gross per 24 hour  Intake      0 ml  Output    956 ml  Net   -956 ml   Physical Exam  Constitutional: He appears well-developed and well-nourished.  Scar across the patients sternum, well healed.  HENT:  Head: Normocephalic and atraumatic.  Mouth/Throat: Oropharynx is clear and moist. No oropharyngeal exudate.  Eyes: Pupils are equal, round, and reactive to light.  Cardiovascular: Normal rate, regular rhythm, normal heart sounds and intact distal pulses.   Pulmonary/Chest: Effort normal and breath sounds normal. No respiratory distress. He has no wheezes. He has no rales.  Abdominal: Soft. Bowel sounds are normal. He exhibits no distension. There is no tenderness.     Lab Results: Basic Metabolic Panel:  Recent Labs Lab 06/24/13 1235 06/30/13 0742  NA 135 137  K 4.4 4.6  CL 94* 100  CO2 25 23  GLUCOSE 110* 90  BUN 18 22  CREATININE 6.07* 6.75*  CALCIUM 8.9 9.0   Liver Function Tests:  Recent Labs Lab 06/24/13 1235  AST 46*  ALT 31  ALKPHOS 131*  BILITOT 1.0  PROT 7.6  ALBUMIN 2.9*   CBC:  Recent Labs Lab 06/24/13 1235 06/30/13 0742  WBC 12.1* 9.3  NEUTROABS  --  3.3  HGB 12.6* 12.1*  HCT 40.2 39.0  MCV 87.0 88.2  PLT 275 253   Cardiac Enzymes:  Recent Labs Lab 06/30/13 0806  TROPONINI <0.30   Thyroid Function Tests:  Recent Labs Lab  06/24/13 1645  TSH 6.572*   Coagulation:  Recent Labs Lab 06/24/13 1235 06/29/13 06/30/13 0742  LABPROT 28.6*  --  19.2*  INR 2.81* 2.1 1.67*   Urine Drug Screen:   Studies/Results: Dg Neck Soft Tissue  06/30/2013   *RADIOLOGY REPORT*  Clinical Data: Nausea, dysphasia  NECK SOFT TISSUES - 1+ VIEW  Comparison: None.  Findings: Lateral neck soft tissues are unremarkable.  No prevertebral soft tissue swelling.  Degenerative changes of the visualized cervical spine.  The patient is edentulous.  IMPRESSION: Unremarkable lateral neck soft tissues.   Original Report Authenticated By: Charline Bills, M.D.   Dg Chest 2 View  06/30/2013   *RADIOLOGY REPORT*  Clinical Data: Dysphasia, nausea  CHEST - 2 VIEW  Comparison: 06/24/2013  Findings: Bilateral lower lobe scarring and/or atelectasis. No pleural effusion or pneumothorax.  The heart is normal in size. Postsurgical changes related to prior CABG.  Stable right subclavian dual lumen central venous catheter.  IMPRESSION: No evidence of acute cardiopulmonary disease.   Original Report Authenticated By: Charline Bills, M.D.   Ct Head Wo Contrast  06/30/2013   CLINICAL DATA:  Difficulty swallowing. Frontal headache.  EXAM: CT HEAD WITHOUT CONTRAST  TECHNIQUE: Contiguous axial images were obtained from the base  of the skull through the vertex without intravenous contrast.  COMPARISON:  MRI 03/01/2011.  FINDINGS: Mild age related volume loss. No acute intracranial abnormality. Specifically, no hemorrhage, hydrocephalus, mass lesion, acute infarction, or significant intracranial injury. No acute calvarial abnormality. Visualized paranasal sinuses and mastoids clear. Orbital soft tissues unremarkable.  IMPRESSION: No acute intracranial abnormality.   Electronically Signed   By: Charlett Nose   On: 06/30/2013 08:41   Medications: I have reviewed the patient's current medications. Scheduled Meds: . atorvastatin  20 mg Oral q1800  . calcium acetate  667  mg Oral TID WC  . diltiazem  360 mg Oral Daily  . levothyroxine  100 mcg Oral QAC breakfast  . multivitamin  1 tablet Oral QHS  . pantoprazole  40 mg Oral BID AC  . sodium chloride  3 mL Intravenous Q12H  . Warfarin - Pharmacist Dosing Inpatient   Does not apply q1800   Continuous Infusions:  PRN Meds:.sodium chloride, sodium chloride, acetaminophen, acetaminophen, feeding supplement (NEPRO CARB STEADY), heparin, lidocaine (PF), lidocaine-prilocaine, meclizine, ondansetron (ZOFRAN) IV, ondansetron, pentafluoroprop-tetrafluoroeth Assessment/Plan: Principal Problem:   Dysphagia Active Problems:   ESRD on dialysis  The patient is a 79 yo man, history of recent CABG, and dysphagia x2 months, presenting with chronic dysphagia.   # Chronic dysphagia 2/2 esophageal stricture - the patient notes a 39-month history of dysphagia, since intubation and VDRF in July 2014. Chart review of SLP notes reveals patient initially had signs of aspiration on swallow 7/18 (made NPO), with fiberoptic endoscopic evaluation 7/18 showing mild oral and pharyngeal phase dysphagia (adv to dys 3 w/nectar thick). By 7/23, dysphagia had improved somewhat (adv to dys 3 w/thin), and by re-evaluation 7/25 pt had no signs of aspiration (adv to regular w/thin). Chart review reveals weight loss over the last few months (was 218 on 7/30, now 198 on 8/20). Given description of "food getting stuck" in his throat, I'm more concerned about an esophageal cause of dysphagia, such as esophageal stricture/ring vs motility disorder vs esophagitis  -SLP rec dys 3 diet -GI says that the patient most likely to esophageal stricture. Could possibly perform a dilation, but patient does not want this.  # Atrial Flutter - the patient was noted to have afib/aflutter during his hospitalization 05/2013, started on coumadin and diltiazem. Patient currently in aflutter with HR in 120's, asymptomatic. INR = 1.67 8/26 (was 2.1 8/25)  -coumadin per pharmacy    -continue dilt at 360 mg XR daily. If HR remains above 110 on this medication, consider starting beta blocker. DO not start BB 2/2 to low BP.  # ESRD - on HD intermittently since 05/2013, permanently since 06/2013. Pt has TTS HD in [place] (last HD 8/23)  -Nephrology consulted, appreciate recs   # Suicidal Ideation - after my examination, the patient noted SI to the nurse.  -place on suicide precautions, with 1:1 sitter  -consulted psych  - On 8/27 the patient denies SI/HI. Says that he was frustrated with all the questions yesterday and simply said yes to everything. He did not hear or understand the question yesterday. Today he hears and understands my questions ans denies SI/HI.  # Hypothyroidism - started on synthroid 05/27/2013 due to an elevated TSH. Recheck of TSH 8/20 was 6.57, though it can take up to 6 weeks to see improvement in TSH.  -continue synthroid 100 mcg daily for now   # DVT prophylaxis - Questionable history of HIT. Will use SCD's for DVT prophy. INR will likely  be therapeutic in the next 1-2 days as well.  Dispo: Disposition is deferred at this time, awaiting improvement of current medical problems.  Anticipated discharge in approximately 1-2 day(s).   The patient does have a current PCP (Toma Deiters, MD) and does not need an [facility] hospital follow-up appointment after discharge.  The patient does have transportation limitations that hinder transportation to clinic appointments.  .Services Needed at time of discharge: Y = Yes, Blank = No PT:   OT:   RN:   Equipment:   Other:     LOS: 1 day   Pleas Koch, MD 07/01/2013, 9:24 AM

## 2013-07-01 NOTE — Consult Note (Signed)
Reason for Consult: Suicidal ideation Referring Physician: Dr. Lisbeth Renshaw is an 79 y.o. male.  HPI: Patient is seen and chart reviewed. Patient reported that when he was admitted with dysphagia he felt somewhat depressed and suicidal. Patient has been able to do well at this time and denies current symptoms of depression anxiety or psychosis. Patient stated he has no history of mental illness or outpatient psychiatric services or inpatient psychiatric treatment. Patient has no history of suicide attempts. Patient has no family history of suicidal ideations or attempts. Patient denies drug of abuse, legal problems or problems with abuse and victimization.  Mental Status Examination: Patient appeared as per his stated age, casually dressed, and fairly groomed, and maintaining good eye contact. Patient has good mood and his affect was appropriate and congruent. He has normal rate, rhythm, and volume of speech. His thought process is linear and goal directed. Patient has denied suicidal, homicidal ideations, intentions or plans. Patient has no evidence of auditory or visual hallucinations, delusions, and paranoia. Patient has fair insight judgment and impulse control.  Past Medical History  Diagnosis Date  . Hypertension   . Depression   . 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 Coronary artery disease   . ESRD on dialysis 06/2013    TTS HD in [place]  . Myocardial infarction 05/2013    NSTEMI, s/p CABG 05/13/13  . Peripheral arterial disease   . Atrial flutter   . DVT (deep venous thrombosis) 02/2011    LLL  . GERD (gastroesophageal reflux disease)   . Compression fracture of cervical spine   . Rheumatoid arthritis   . Prostate cancer   . Stasis dermatitis     LLL, wears TED hose  . AAA (abdominal aortic aneurysm)   . 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 CHF (congestive heart failure)     EF 45-55% per echo 05/28/13  . Hyperlipidemia   . Dysphagia 05/2013    after prolonged  intubation  . Stroke     Past Surgical History  Procedure Laterality Date  . Eye surgery    . Coronary artery bypass graft N/A 05/13/2013    Procedure: CORONARY ARTERY BYPASS GRAFTING (CABG);  Surgeon: Alleen Borne, MD;  Location[facility] OR;  Service: Open Heart Surgery;  Laterality: N/A;  . Insertion of dialysis catheter N/A 05/26/2013    Procedure: INSERTION OF DIALYSIS CATHETER;  Surgeon: Sherren Kerns, MD;  Location: [facility] OR;  Service: Vascular;  Laterality: N/A;  right IJ  . Cardiac surgery    . Cataract extraction      History reviewed. No pertinent family history.  Social History:  reports that he quit smoking about 12 years ago. He has never used smokeless tobacco. He reports that he does not drink alcohol or use illicit drugs.  Allergies:  Allergies  Allergen Reactions  . 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  Results for orders placed during the hospital encounter of 06/30/13 (from the past 48 hour(s))  CBC WITH DIFFERENTIAL     Status: Abnormal   Collection Time    06/30/13  7:42 AM      Result Value Range   WBC 9.3  4.0 - 10.5 K/uL   RBC 4.42  4.22 - 5.81 MIL/uL   Hemoglobin 12.1 (*) 13.0 - 17.0 g/dL   HCT 16.1  09.6 -  52.0 %   MCV 88.2  78.0 - 100.0 fL   MCH 27.4  26.0 - 34.0 pg   MCHC 31.0  30.0 - 36.0 g/dL   RDW 16.1 (*) 09.6 - 04.5 %   Platelets 253  150 - 400 K/uL   Neutrophils Relative % 36 (*) 43 - 77 %   Neutro Abs 3.3  1.7 - 7.7 K/uL   Lymphocytes Relative 53 (*) 12 - 46 %   Lymphs Abs 4.9 (*) 0.7 - 4.0 K/uL   Monocytes Relative 8  3 - 12 %   Monocytes Absolute 0.7  0.1 - 1.0 K/uL   Eosinophils Relative 3  0 - 5 %   Eosinophils Absolute 0.2  0.0 - 0.7 K/uL   Basophils Relative 0  0 - 1 %   Basophils Absolute 0.0  0.0 - 0.1 K/uL  BASIC METABOLIC PANEL     Status: Abnormal   Collection Time    06/30/13  7:42 AM      Result Value Range   Sodium 137  135 - 145 mEq/L   Potassium 4.6   3.5 - 5.1 mEq/L   Chloride 100  96 - 112 mEq/L   CO2 23  19 - 32 mEq/L   Glucose, Bld 90  70 - 99 mg/dL   BUN 22  6 - 23 mg/dL   Creatinine, Ser 4.09 (*) 0.50 - 1.35 mg/dL   Calcium 9.0  8.4 - 81.1 mg/dL   GFR calc non Af Amer 7 (*) >90 mL/min   GFR calc Af Amer 8 (*) >90 mL/min   Comment: (NOTE)     The eGFR has been calculated using the CKD EPI equation.     This calculation has not been validated in all clinical situations.     eGFR's persistently <90 mL/min signify possible Chronic Kidney     Disease.  PROTIME-INR     Status: Abnormal   Collection Time    06/30/13  7:42 AM      Result Value Range   Prothrombin Time 19.2 (*) 11.6 - 15.2 seconds   INR 1.67 (*) 0.00 - 1.49  TROPONIN I     Status: None   Collection Time    06/30/13  8:06 AM      Result Value Range   Troponin I <0.30  <0.30 ng/mL   Comment:            Due to the release kinetics of cTnI,     a negative result within the first hours     of the onset of symptoms does not rule out     myocardial infarction with certainty.     If myocardial infarction is still suspected,     repeat the test at appropriate intervals.  HEPATITIS B SURFACE ANTIGEN     Status: None   Collection Time    06/30/13  5:00 PM      Result Value Range   Hepatitis B Surface Ag NEGATIVE  NEGATIVE   Comment: Performed at [employer] [facility]  PROTIME-INR     Status: Abnormal   Collection Time    07/01/13  2:36 PM      Result Value Range   Prothrombin Time 19.5 (*) 11.6 - 15.2 seconds   INR 1.70 (*) 0.00 - 1.49    Dg Neck Soft Tissue  06/30/2013   *RADIOLOGY REPORT*  Clinical Data: Nausea, dysphasia  NECK SOFT TISSUES - 1+ VIEW  Comparison: None.  Findings: Lateral neck  soft tissues are unremarkable.  No prevertebral soft tissue swelling.  Degenerative changes of the visualized cervical spine.  The patient is edentulous.  IMPRESSION: Unremarkable lateral neck soft tissues.   Original Report Authenticated By: Charline Bills, M.D.   Dg  Chest 2 View  06/30/2013   *RADIOLOGY REPORT*  Clinical Data: Dysphasia, nausea  CHEST - 2 VIEW  Comparison: 06/24/2013  Findings: Bilateral lower lobe scarring and/or atelectasis. No pleural effusion or pneumothorax.  The heart is normal in size. Postsurgical changes related to prior CABG.  Stable right subclavian dual lumen central venous catheter.  IMPRESSION: No evidence of acute cardiopulmonary disease.   Original Report Authenticated By: Charline Bills, M.D.   Ct Head Wo Contrast  06/30/2013   CLINICAL DATA:  Difficulty swallowing. Frontal headache.  EXAM: CT HEAD WITHOUT CONTRAST  TECHNIQUE: Contiguous axial images were obtained from the base of the skull through the vertex without intravenous contrast.  COMPARISON:  MRI 03/01/2011.  FINDINGS: Mild age related volume loss. No acute intracranial abnormality. Specifically, no hemorrhage, hydrocephalus, mass lesion, acute infarction, or significant intracranial injury. No acute calvarial abnormality. Visualized paranasal sinuses and mastoids clear. Orbital soft tissues unremarkable.  IMPRESSION: No acute intracranial abnormality.   Electronically Signed   By: Charlett Nose   On: 06/30/2013 08:41   Dg Esophagus  07/01/2013   *RADIOLOGY REPORT*  Clinical Data:Dysphagia.  ESOPHAGUS/BARIUM SWALLOW/TABLET STUDY  Fluoroscopy Time: 1 minute and 19 seconds.  Comparison: None.  Findings: Initial barium swallows demonstrate normal pharyngeal motion with swallowing.  No laryngeal penetration or aspiration. No upper esophageal webs, strictures or diverticuli.  Esophageal dysmotility with disruption of the primary peristaltic wave and frequent and coordinated tertiary contractions/diffuse esophageal spasm.  No obvious mass or stricture.  A 13 mm barium pill briefly hung up in the distal esophagus.  No hiatal hernia or gastroesophageal reflux was demonstrated.  IMPRESSION:  1.  Esophageal dysmotility with probable diffuse esophageal spasm. 2.  No esophageal mass or  stricture. 3.  No hiatal hernia or GE reflux.   Original Report Authenticated By: Rudie Meyer, M.D.    Positive for bad mood Blood pressure 115/81, pulse 121, temperature 97.6 F (36.4 C), temperature source Oral, resp. rate 20, height 6\' 1"  (1.854 m), weight 84.602 kg (186 lb 8.2 oz), SpO2 95.00%.   Assessment/Plan: Mood disorder not otherwise specified  Recommendation: Patient does not meet criteria for acute psychiatric hospitalization and patient does not have a safety concerns Recommended no medication management Appreciate psychiatric consultation and will sign off at this time   Nehemiah Settle., M.D. 07/01/2013, 8:27 PM

## 2013-07-02 ENCOUNTER — Encounter (HOSPITAL_COMMUNITY): Payer: Self-pay | Admitting: Nurse Practitioner

## 2013-07-02 DIAGNOSIS — I4892 Unspecified atrial flutter: Secondary | ICD-10-CM

## 2013-07-02 DIAGNOSIS — I2510 Atherosclerotic heart disease of native coronary artery without angina pectoris: Secondary | ICD-10-CM

## 2013-07-02 DIAGNOSIS — N186 End stage renal disease: Secondary | ICD-10-CM

## 2013-07-02 LAB — PROTIME-INR: Prothrombin Time: 22.8 seconds — ABNORMAL HIGH (ref 11.6–15.2)

## 2013-07-02 LAB — MAGNESIUM: Magnesium: 1.7 mg/dL (ref 1.5–2.5)

## 2013-07-02 MED ORDER — LIDOCAINE-PRILOCAINE 2.5-2.5 % EX CREA: 1.0000 "application " | TOPICAL_CREAM | CUTANEOUS | Status: DC | PRN

## 2013-07-02 MED ORDER — PENTAFLUOROPROP-TETRAFLUOROETH EX AERO: 1.0000 "application " | INHALATION_SPRAY | CUTANEOUS | Status: DC | PRN

## 2013-07-02 MED ORDER — SODIUM CHLORIDE 0.9 % IV BOLUS (SEPSIS)
250.0000 mL | Freq: Once | INTRAVENOUS | Status: AC
  Administered 2013-07-02: 250 mL via INTRAVENOUS

## 2013-07-02 MED ORDER — AMIODARONE HCL 200 MG PO TABS
400.0000 mg | ORAL_TABLET | Freq: Two times a day (BID) | ORAL | Status: DC
  Administered 2013-07-02 – 2013-07-03 (×3): 400 mg via ORAL
  Filled 2013-07-02 (×4): qty 2

## 2013-07-02 MED ORDER — LIDOCAINE HCL (PF) 1 % IJ SOLN: 5.0000 mL | INTRAMUSCULAR | Status: DC | PRN

## 2013-07-02 MED ORDER — HYDROXYZINE HCL 25 MG PO TABS
ORAL_TABLET | ORAL | Status: AC
  Filled 2013-07-02: qty 1

## 2013-07-02 MED ORDER — HYDROXYZINE HCL 25 MG PO TABS
25.0000 mg | ORAL_TABLET | Freq: Three times a day (TID) | ORAL | Status: DC | PRN
  Administered 2013-07-02 (×2): 25 mg via ORAL
  Filled 2013-07-02 (×2): qty 1

## 2013-07-02 MED ORDER — HEPARIN SODIUM (PORCINE) 1000 UNIT/ML DIALYSIS
1000.0000 [IU] | INTRAMUSCULAR | Status: DC | PRN
  Filled 2013-07-02: qty 1

## 2013-07-02 MED ORDER — SODIUM CHLORIDE 0.9 % IV SOLN: 100.0000 mL | INTRAVENOUS | Status: DC | PRN

## 2013-07-02 MED ORDER — ALTEPLASE 2 MG IJ SOLR
2.0000 mg | Freq: Once | INTRAMUSCULAR | Status: AC | PRN
  Filled 2013-07-02: qty 2

## 2013-07-02 MED ORDER — DILTIAZEM HCL ER COATED BEADS 180 MG PO CP24
180.0000 mg | ORAL_CAPSULE | Freq: Every day | ORAL | Status: DC
  Filled 2013-07-02 (×2): qty 1

## 2013-07-02 MED ORDER — NEPRO/CARBSTEADY PO LIQD: 237.0000 mL | ORAL | Status: DC | PRN

## 2013-07-02 NOTE — Progress Notes (Signed)
Subjective:  LE itching overnight. After 250 cc fluid bolus, orthostatic symptoms resolved and BP remain in normal limits.  Objective: Vital signs in last 24 hours: Filed Vitals:   07/01/13 2039 07/02/13 0516 07/02/13 0626 07/02/13 0649  BP: 114/70 125/75  98/67  Pulse: 112 119  120  Temp: 98.4 F (36.9 C) 97.9 F (36.6 C) 97.5 F (36.4 C)   TempSrc: Oral Oral    Resp: 20 20  20   Height: 6\' 1"  (1.854 m)     Weight: 186 lb 8.2 oz (84.602 kg)  193 lb 9 oz (87.8 kg)   SpO2: 98% 98%     Weight change: -14 lb 8.8 oz (-6.598 kg)  Intake/Output Summary (Last 24 hours) at 07/02/13 1914 Last data filed at 07/02/13 0520  Gross per 24 hour  Intake    480 ml  Output      0 ml  Net    480 ml   Physical Exam  Constitutional: He appears well-developed and well-nourished.  Scar across the patients sternum, well healed.  HENT:  Head: Normocephalic and atraumatic.  Mouth/Throat: Oropharynx is clear and moist. No oropharyngeal exudate.  Eyes: Pupils are equal, round, and reactive to light.  Cardiovascular: Regular rhythm, normal heart sounds and intact distal pulses.   tachycardic to 130  Pulmonary/Chest: Effort normal and breath sounds normal. No respiratory distress. He has no wheezes. He has no rales.  Abdominal: Soft. Bowel sounds are normal. He exhibits no distension. There is no tenderness.     Lab Results: Basic Metabolic Panel:  Recent Labs Lab 06/30/13 0742  NA 137  K 4.6  CL 100  CO2 23  GLUCOSE 90  BUN 22  CREATININE 6.75*  CALCIUM 9.0   Liver Function Tests: No results found for this basename: AST, ALT, ALKPHOS, BILITOT, PROT, ALBUMIN,  in the last 168 hours CBC:  Recent Labs Lab 06/30/13 0742  WBC 9.3  NEUTROABS 3.3  HGB 12.1*  HCT 39.0  MCV 88.2  PLT 253   Cardiac Enzymes:  Recent Labs Lab 06/30/13 0806  TROPONINI <0.30   Thyroid Function Tests: No results found for this basename: TSH, T4TOTAL, FREET4, T3FREE, THYROIDAB,  in the last 168  hours Coagulation:  Recent Labs Lab 06/29/13 06/30/13 0742 07/01/13 1436  LABPROT  --  19.2* 19.5*  INR 2.1 1.67* 1.70*   Urine Drug Screen:   Studies/Results: Dg Neck Soft Tissue  06/30/2013   *RADIOLOGY REPORT*  Clinical Data: Nausea, dysphasia  NECK SOFT TISSUES - 1+ VIEW  Comparison: None.  Findings: Lateral neck soft tissues are unremarkable.  No prevertebral soft tissue swelling.  Degenerative changes of the visualized cervical spine.  The patient is edentulous.  IMPRESSION: Unremarkable lateral neck soft tissues.   Original Report Authenticated By: Charline Bills, M.D.   Dg Chest 2 View  06/30/2013   *RADIOLOGY REPORT*  Clinical Data: Dysphasia, nausea  CHEST - 2 VIEW  Comparison: 06/24/2013  Findings: Bilateral lower lobe scarring and/or atelectasis. No pleural effusion or pneumothorax.  The heart is normal in size. Postsurgical changes related to prior CABG.  Stable right subclavian dual lumen central venous catheter.  IMPRESSION: No evidence of acute cardiopulmonary disease.   Original Report Authenticated By: Charline Bills, M.D.   Ct Head Wo Contrast  06/30/2013   CLINICAL DATA:  Difficulty swallowing. Frontal headache.  EXAM: CT HEAD WITHOUT CONTRAST  TECHNIQUE: Contiguous axial images were obtained from the base of the skull through the vertex without intravenous contrast.  COMPARISON:  MRI 03/01/2011.  FINDINGS: Mild age related volume loss. No acute intracranial abnormality. Specifically, no hemorrhage, hydrocephalus, mass lesion, acute infarction, or significant intracranial injury. No acute calvarial abnormality. Visualized paranasal sinuses and mastoids clear. Orbital soft tissues unremarkable.  IMPRESSION: No acute intracranial abnormality.   Electronically Signed   By: Charlett Nose   On: 06/30/2013 08:41   Dg Esophagus  07/01/2013   *RADIOLOGY REPORT*  Clinical Data:Dysphagia.  ESOPHAGUS/BARIUM SWALLOW/TABLET STUDY  Fluoroscopy Time: 1 minute and 19 seconds.   Comparison: None.  Findings: Initial barium swallows demonstrate normal pharyngeal motion with swallowing.  No laryngeal penetration or aspiration. No upper esophageal webs, strictures or diverticuli.  Esophageal dysmotility with disruption of the primary peristaltic wave and frequent and coordinated tertiary contractions/diffuse esophageal spasm.  No obvious mass or stricture.  A 13 mm barium pill briefly hung up in the distal esophagus.  No hiatal hernia or gastroesophageal reflux was demonstrated.  IMPRESSION:  1.  Esophageal dysmotility with probable diffuse esophageal spasm. 2.  No esophageal mass or stricture. 3.  No hiatal hernia or GE reflux.   Original Report Authenticated By: Rudie Meyer, M.D.   Medications: I have reviewed the patient's current medications. Scheduled Meds: . atorvastatin  20 mg Oral q1800  . calcium acetate  667 mg Oral TID WC  . hydrOXYzine      . levothyroxine  100 mcg Oral QAC breakfast  . multivitamin  1 tablet Oral QHS  . pantoprazole  40 mg Oral BID AC  . sodium chloride  3 mL Intravenous Q12H   Continuous Infusions:  PRN Meds:.sodium chloride, sodium chloride, acetaminophen, acetaminophen, feeding supplement (NEPRO CARB STEADY), heparin, hydrOXYzine, lidocaine (PF), lidocaine-prilocaine, meclizine, ondansetron (ZOFRAN) IV, ondansetron, pentafluoroprop-tetrafluoroeth Assessment/Plan: Principal Problem:   Dysphagia Active Problems:   ESRD on dialysis  The patient is a 79 yo man, history of recent CABG, and dysphagia x2 months, presenting with chronic dysphagia.   # Chronic dysphagia 2/2 esophageal stricture - the patient notes a 37-month history of dysphagia, since intubation and VDRF in July 2014. Chart review of SLP notes reveals patient initially had signs of aspiration on swallow 7/18 (made NPO), with fiberoptic endoscopic evaluation 7/18 showing mild oral and pharyngeal phase dysphagia (adv to dys 3 w/nectar thick). By 7/23, dysphagia had improved somewhat  (adv to dys 3 w/thin), and by re-evaluation 7/25 pt had no signs of aspiration (adv to regular w/thin). Chart review reveals weight loss over the last few months (was 218 on 7/30, now 198 on 8/20). Given description of "food getting stuck" in his throat, I'm more concerned about an esophageal cause of dysphagia, such as esophageal stricture/ring vs motility disorder vs esophagitis  -SLP rec dys 3 diet -GI says that the patient most likely to esophageal stricture. Could possibly perform a dilation, but patient does not want this as he believes that the risks outweigh the benefits at this time.  # Atrial Flutter - the patient was noted to have afib/aflutter during his hospitalization 05/2013, started on coumadin and diltiazem. Patient currently in aflutter with HR in 120's, asymptomatic.  INR = 2.09 on 8/28, 1.67 8/26 (was 2.1 8/25)  -coumadin per pharmacy   -Plan to consult cardiology for rec's regarding rate control emdications, as patient had remained tachycardic and had borderline blood pressure. Of note, the blood pressure responding to 250 cc fluid bolus. Plan to consult cardiology for rec's.   # ESRD - on HD intermittently since 05/2013, permanently since 06/2013. Pt has TTS HD in  [place] (last HD 8/23)  -Nephrology consulted, appreciate recs   # Pruritus - Likely 2/2 to uremic puritus. Plan to optimize dialysis and start hydroxyzine 25 mg tid prn.   # Suicidal Ideation - after my examination, the patient noted SI to the nurse.  -place on suicide precautions, with 1:1 sitter  -consulted psych: Rec no medication. State patient is not at risk to self or others.  - On 8/27 the patient denies SI/HI. Says that he was frustrated with all the questions yesterday and simply said yes to everything. He did not hear or understand the question yesterday. Today he hears and understands my questions ans denies SI/HI. - Psych evaluated the patient and states that he is not a danger to himself or others and is  safe to discharge from a psychiatric perspective.  # Hypothyroidism - started on synthroid 05/27/2013 due to an elevated TSH. Recheck of TSH 8/20 was 6.57, though it can take up to 6 weeks to see improvement in TSH.  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 . Will use SCD's for DVT prophy. INR 2.09 on 8/28 and trending up.  Dispo: Disposition is deferred at this time, awaiting improvement of current medical problems.  Anticipated discharge in approximately 1-2 day(s).  The patient does have a current PCP (Toma Deiters, MD) and does not need an [facility] hospital follow-up appointment after discharge.  The patient does have transportation limitations that hinder transportation to clinic appointments.  .Services Needed at time of discharge: Y = Yes, Blank = No PT:   OT:   RN:   Equipment:   Other:     LOS: 2 days   Pleas Koch, MD 07/02/2013, 7:07 AM

## 2013-07-02 NOTE — Progress Notes (Signed)
Dr. Casimiro Needle, Nephrologist ordered me to notify internal medicine about the increased HR. I also  Told him that the patient's HR was high when I dialyzed him on Tuesday 06/30/13. Called Dr. Glendell Docker internal medicine to notified him of [name] increased HR; he stated he would assess the patient when he rounds; he also stated that the cardizem was discontinued d/t low BPs and the would assess what other medication to give the pt when he rounds.

## 2013-07-02 NOTE — Progress Notes (Signed)
Pt with complaints of itching to bilateral Lower extremities, no new areas of redness or inflammation noted.  Have attempted washing and rub down with lotion with no relief. Page to [phone] for notification. Awaiting call back. Dondra Spry

## 2013-07-02 NOTE — Progress Notes (Signed)
Call placed to Dr. [name] informed of patients ortho static blood pressure, (see doc flow sheet) new order to hold Cardizem for today.

## 2013-07-02 NOTE — Progress Notes (Signed)
BaS swallows results reviewed again w/ pt and family, and recommendations for mgt of his presbyesophagus and esoph spasm discussed.  I have encouraged them to contact me if conservative modalities (such as soft food, chopped meat, small boluses of food, alternating food/liquids during a meal) are not effective to his satisfaction, in which case we could consider a trial of Botox or perhaps a calcium channel blocker  (which would probably not be well tolerated in view of his tendency towards low blood pressure).  I have also recommended that they go back to once daily Protonix if they don't notice definite improvement within a month of twice-daily dosing.  I will sign off at this time. Please let him know if I can be of further assistance with this patient.  Florencia Reasons, M.D. [phone]

## 2013-07-02 NOTE — Consult Note (Signed)
CARDIOLOGY CONSULT NOTE  Patient ID: [name] MRN: [id number], DOB/AGE: 1934/05/26   Admit date: 06/30/2013 Date of Consult: 07/02/2013  Primary Physician: Toma Deiters, MD Primary Cardiologist: Dominga Ferry, MD - pt to f/u in [place] office, not previously seen there.  Pt. Profile  79 y/o male with recent complex history starting in July and stemming from NSTEMI, aflutter, cardiogenic shock, 3 vessel CAD, CABG x 4, ESRD with initiation of dialysis, and dysphagia who was readmitted on 8/26 and we've been asked to eval 2/2 aflutter.  Problem List  Past Medical History  Diagnosis Date  . Hypertension   . Depression   . 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 Coronary artery disease     a. 05/2013 NSTEMI/Cath: LM 90d, LAD 90ost/34m, LCX 90ost, OM1 90p, RCA 100p;  b. 05/2013 CABG x 4: LIMA->LAD, VG->RI->OM, VG->PDA.  [name] ESRD on dialysis 06/2013    TTS HD in [place]  . Peripheral arterial disease   . Atrial flutter     a. Dx 05/2013-->dilt/coumadin.  [name] DVT (deep venous thrombosis) 02/2011    LLL  . GERD (gastroesophageal reflux disease)   . Compression fracture of cervical spine   . Rheumatoid arthritis   . Prostate cancer   . Stasis dermatitis     LLL, wears TED hose  . AAA (abdominal aortic aneurysm)   . 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 Chronic systolic CHF (congestive heart failure)     EF 45-55% per echo 05/28/13  . Hyperlipidemia   . Dysphagia     a. after prolonged intubation 05/2013; b. 06/2013 refused EDG/Dilatation.  . Stroke     Past Surgical History  Procedure Laterality Date  . Eye surgery    . Coronary artery bypass graft N/A 05/13/2013    Procedure: CORONARY ARTERY BYPASS GRAFTING (CABG);  Surgeon: Alleen Borne, MD;  Location: [facility] OR;  Service: Open Heart Surgery;  Laterality: N/A;  . Insertion of dialysis catheter N/A 05/26/2013    Procedure: INSERTION OF DIALYSIS CATHETER;  Surgeon: Sherren Kerns, MD;  Location: [facility] OR;  Service: Vascular;  Laterality:  N/A;  right IJ  . Cardiac surgery    . Cataract extraction      Allergies  Allergies  Allergen Reactions  . 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   HPI   79 y/o male with the above complex problem list.  Pts cardiac hx dates back to July of this year when he presented to [facility] with aflutter and chest pain.  He r/i for NSTEMI and was transferred to [facility]  He underwent cath on 7/8 revealing severe multivessel dzs including 90% distal LM stenosis.  He then underwent urgent CABG x 4.  Post-op course was complicated by HCAP and resp failure, progression of renal failure requiring dialysis access and initiation, and persistent atrial flutter, which was rate controlled with dilt.  Coumadin was also initiated.  He was finally d/c'd to inpt rehab on 7/29 and after ~ 10 day stay there, he was discharged back to the inpt side for continued dialysis care while outpt dialysis was being arranged.  He was finally d/c'd from [facility] on 8/12 and initiated outpt dialysis in [place].  INR's have been followed through our [facility] clinic with labs drawn in dialysis.  He was seen by Dr. Laneta Simmers in clinic on 8/20 and was noted to be tachycardic and hypotensive.  He was sent  to the ED for evaluation and given a fluid bolus with improved bp/hr.  He was d/c'd home from ED.  Unfortunately, pt has been experiencing dysphagia and nausea since his CABG.  This has been progressive.  Although he's been able to swallow his pills, he has had significant trouble with a regular diet.  As a result, he presented back to the ED on 8/26 where he was readmitted and seen by GI.  EGD and dil were offered however pt refused.  Throughout his admission, he has remained in atrial flutter with rates generally above 100 and often above 120.  He reports that @ home, on Dilt 360, his rates were running in the 70's to 80's.  This morning, pt c/o orthostasis and was hypotensive.  He was treated with VIF with improved BP's.  His diltiazem has been held and  we've been asked to evaluate.  He is currently asymptomatic.  He denies any chest pain or dyspnea since his CABG.  Inpatient Medications  . atorvastatin  20 mg Oral q1800  . calcium acetate  667 mg Oral TID WC  . levothyroxine  100 mcg Oral QAC breakfast  . multivitamin  1 tablet Oral QHS  . pantoprazole  40 mg Oral BID AC  . sodium chloride  3 mL Intravenous Q12H   Family History Family History  Problem Relation Age of Onset  . Heart attack Mother     deceased @ 49  . Kidney failure Father     deceased @ 61  . Heart attack Son     deceased @ 33    Social History History   Social History  . Marital Status: Married    Spouse Name: N/A    Number of Children: N/A  . Years of Education: N/A   Occupational History  . Not on file.   Social History Main Topics  . Smoking status: Former Smoker -- 1.00 packs/day for 47 years    Quit date: 11/05/2000  . Smokeless tobacco: Never Used  . Alcohol Use: No     Comment: Previously drank 6 beers/day, quit in 1994  . Drug Use: No  . Sexual Activity: Not on file   Other Topics Concern  . Not on file   Social History Narrative   Lives in [place] with wife.    Review of Systems  General:  No chills, fever, night sweats or weight changes.  Cardiovascular:  No chest pain, dyspnea on exertion, edema, orthopnea, +++ occasional palpitations and orthostasis, no paroxysmal nocturnal dyspnea. Dermatological: No rash, lesions/masses Respiratory: No cough, dyspnea Urologic: No hematuria, dysuria Abdominal:   +++ dysphagia and nausea.  No vomiting, diarrhea, bright red blood per rectum, melena, or hematemesis Neurologic:  No visual changes, wkns, changes in mental status. All other systems reviewed and are otherwise negative except as noted above.  Physical Exam  Blood pressure 108/66, pulse 126, temperature 98.2 F (36.8 C), temperature source Oral, resp. rate 22, height 6\' 1"  (1.854 m), weight 188 lb 0.8 oz (85.3 kg), SpO2 99.00%.    General: Pleasant, NAD Psych: Normal affect. Neuro: Alert and oriented X 3. Moves all extremities spontaneously. HEENT: Normal  Neck: Supple without bruits or JVD. Lungs:  Resp regular and unlabored, CTA. Heart: RRR, tachy, no s3, s4, or murmurs. Abdomen: Soft, non-tender, non-distended, BS + x 4.  Extremities: No clubbing, cyanosis or edema. Chronic venous stasis changes lle.  DP/PT/Radials 2+ and equal bilaterally.  Labs   Recent Labs  06/30/13 0806  TROPONINI <  0.30   Lab Results  Component Value Date   WBC 9.3 06/30/2013   HGB 12.1* 06/30/2013   HCT 39.0 06/30/2013   MCV 88.2 06/30/2013   PLT 253 06/30/2013     Recent Labs Lab 06/30/13 0742  NA 137  K 4.6  CL 100  CO2 23  BUN 22  CREATININE 6.75*  CALCIUM 9.0  GLUCOSE 90   Lab Results  Component Value Date   CHOL 172 05/13/2013   HDL 47 05/13/2013   LDLCALC 97 05/13/2013   TRIG 141 05/13/2013   Radiology/Studies  Dg Neck Soft Tissue  06/30/2013   *RADIOLOGY REPORT*  Clinical Data: Nausea, dysphasia  NECK SOFT TISSUES - 1+ VIEW   IMPRESSION: Unremarkable lateral neck soft tissues.   Original Report Authenticated By: Charline Bills, M.D.   Dg Chest 2 View  06/30/2013   *RADIOLOGY REPORT*  Clinical Data: Dysphasia, nausea  CHEST - 2 VIEW  Comparison: 06/24/2013   IMPRESSION: No evidence of acute cardiopulmonary disease.   Original Report Authenticated By: Charline Bills, M.D.   Ct Head Wo Contrast  06/30/2013   CLINICAL DATA:  Difficulty swallowing. Frontal headache.  EXAM: CT HEAD WITHOUT CONTRAST  TECHNIQUE:   IMPRESSION: No acute intracranial abnormality.   Electronically Signed   By: Charlett Nose   On: 06/30/2013 08:41   Dg Esophagus  07/01/2013   *RADIOLOGY REPORT*  Clinical Data:Dysphagia.  ESOPHAGUS/BARIUM SWALLOW/TABLET STUDYIMPRESSION:  1.  Esophageal dysmotility with probable diffuse esophageal spasm. 2.  No esophageal mass or stricture. 3.  No hiatal hernia or GE reflux.   Original Report Authenticated  By: Rudie Meyer, M.D.   ECG  Aflutter, 131, lbbb, inflat st slurring/depression - not acutely changed.  ASSESSMENT AND PLAN  1.  Atrial Flutter:  Rate control limited by hypotension in setting of needing high dose dilt to manage rate and dialysis.  As he is now chronically on dialysis, this is likely to remain an issue going forward.  INR's have been therapeutic all but 2 days dating back several wks (8/26 and 8/27).  We will initiate amiodarone 400mg  bid and resume dilt but reduce to 180mg  daily.  Addition of amio may impact INR thus this will require close, weekly f/u over the next 4 wks.  Coumadin will be held for pending AV graft with vascular surgery next Wed.  If after 4 wks of therapeutic INR, he remains in aflutter, we will plan to pursue outpt DCCV.  We have arranged for f/u with Dr. Wyline Mood in our [place] office on 9/24 at 11 AM.  2.  CAD: s/p CABG and overall doing well.  Cont statin.  Add low-dose asa.  3.  ESRD:  On dialysis.  Pending AV Graft next Wednesday.  Coumadin will be held perioperatively.  4.  Hypothyroidism:  On replacement.  TSH correcting.  F/U in setting of amiodarone.  Signed, Nicolasa Ducking, NP 07/02/2013, 12:01 PM  Patient seen and examined and history reviewed. Agree with above findings and plan. Pleasant 79 yo WM with complex history beginning in July of this year as outlined above. Admitted with symptoms of dysphagia. While in hospital he has been in atrial flutter with RVR- rates sustaining at 120 bpm. He is asymptomatic. He has been on coumadin but has not been consistently therapeutic. Today diltiazem was held due to hypotension. On exam he has no JVD or bruits. Lungs are clear. No gallop or murmur. Options for treating his atrial flutter are limited. Not a candidate for digoxin with  ESRD. Diltiazem and beta blocker therapy limited by hypotension. I recommend starting amiodarone 400 mg bid for rate control. Will reduce diltiazem to 180 mg daily. Will need close  follow up of INRs given interaction with coumadin and amiodarone. Once therapeutic on coumadin for 4 weeks would consider DCCV. He is not a good candidate for atrial flutter ablation since he cannot undergo TEE with stricture (patient refused EGD with dilitation). Patient stable for DC today as planned. We will arrange cardiology follow up in our office in [place].  Theron Arista [facility] 07/02/2013 12:46 PM

## 2013-07-02 NOTE — Progress Notes (Signed)
  Date: 07/02/2013  Patient name: [name]  Medical record number: [id number]  Date of birth: Mar 31, 1934   This patient has been seen and the plan of care was discussed with the house staff. Please see their note for complete details. I concur with their findings.  Inez Catalina, MD 07/02/2013, 4:43 PM

## 2013-07-02 NOTE — Procedures (Signed)
Tolerating treatment but Aflutter at 120.  Will notify IM Dontavius Keim C

## 2013-07-02 NOTE — Progress Notes (Signed)
Call placed to Dr. Glendell Docker informed of patient ortho static BP ( see Doc flow sheet), no new order at this time.

## 2013-07-02 NOTE — Clinical Social Work Psych Note (Signed)
Psych reviewed chart.  Psychiatry was consulted for possible SI.  Psychiatry evaluated pt on 07/01/2013.  No psych needs present.  Psych CSW is signing off.  Please re-consult as necessary.  Vickii Penna, LCSWA [phone]  Clinical Social Work

## 2013-07-03 ENCOUNTER — Encounter: Payer: Medicare Other | Admitting: Physician Assistant

## 2013-07-03 ENCOUNTER — Encounter (HOSPITAL_COMMUNITY): Payer: Self-pay | Admitting: Pharmacy Technician

## 2013-07-03 DIAGNOSIS — I4892 Unspecified atrial flutter: Secondary | ICD-10-CM

## 2013-07-03 DIAGNOSIS — R1310 Dysphagia, unspecified: Secondary | ICD-10-CM

## 2013-07-03 LAB — BASIC METABOLIC PANEL
Calcium: 8.5 mg/dL (ref 8.4–10.5)
Creatinine, Ser: 4.27 mg/dL — ABNORMAL HIGH (ref 0.50–1.35)
GFR calc non Af Amer: 12 mL/min — ABNORMAL LOW (ref >90)
Glucose, Bld: 80 mg/dL (ref 70–99)
Sodium: 139 mEq/L (ref 135–145)

## 2013-07-03 MED ORDER — DOCUSATE SODIUM 100 MG PO CAPS
100.0000 mg | ORAL_CAPSULE | Freq: Every day | ORAL | Status: DC
  Administered 2013-07-03: 100 mg via ORAL
  Filled 2013-07-03: qty 1

## 2013-07-03 MED ORDER — AMIODARONE HCL 400 MG PO TABS: 400.0000 mg | ORAL_TABLET | Freq: Two times a day (BID) | ORAL | Status: DC

## 2013-07-03 MED ORDER — DSS 100 MG PO CAPS: 100.0000 mg | ORAL_CAPSULE | Freq: Every day | ORAL | Status: DC

## 2013-07-03 MED ORDER — HYDROXYZINE HCL 25 MG PO TABS: 25.0000 mg | ORAL_TABLET | Freq: Three times a day (TID) | ORAL | Status: AC | PRN

## 2013-07-03 MED ORDER — DILTIAZEM HCL ER COATED BEADS 180 MG PO CP24: 180.0000 mg | ORAL_CAPSULE | Freq: Every day | ORAL | Status: DC

## 2013-07-03 MED ORDER — DILTIAZEM HCL ER COATED BEADS 180 MG PO CP24
180.0000 mg | ORAL_CAPSULE | Freq: Every day | ORAL | Status: DC
  Administered 2013-07-03: 180 mg via ORAL
  Filled 2013-07-03: qty 1

## 2013-07-03 NOTE — Progress Notes (Signed)
Subjective:  After 250 cc fluid bolus, orthostatic symptoms resolved and BP remain in low normal limits. HR 110's overnight  Objective: Vital signs in last 24 hours: Filed Vitals:   07/02/13 1639 07/02/13 1641 07/02/13 2056 07/03/13 0353  BP: 74/50 71/52 109/68 106/74  Pulse: 111 119 112 112  Temp:   98.3 F (36.8 C) 97.7 F (36.5 C)  TempSrc:   Oral Oral  Resp:   18 18  Height:      Weight:   198 lb 3.2 oz (89.903 kg)   SpO2:   96% 95%   Weight change: 1 lb 8.6 oz (0.699 kg)  Intake/Output Summary (Last 24 hours) at 07/03/13 0724 Last data filed at 07/02/13 1904  Gross per 24 hour  Intake    300 ml  Output   1567 ml  Net  -1267 ml   Physical Exam  Constitutional: He appears well-developed and well-nourished.  Scar across the patients sternum, well healed.  HENT:  Head: Normocephalic and atraumatic.  Mouth/Throat: Oropharynx is clear and moist. No oropharyngeal exudate.  Eyes: Pupils are equal, round, and reactive to light.  Cardiovascular: Regular rhythm, normal heart sounds and intact distal pulses.   tachycardic  Pulmonary/Chest: Effort normal and breath sounds normal. No respiratory distress. He has no wheezes. He has no rales.  Abdominal: Soft. Bowel sounds are normal. He exhibits no distension. There is no tenderness.     Lab Results: Basic Metabolic Panel:  Recent Labs Lab 06/30/13 0742 07/02/13 0805 07/03/13 0420  NA 137  --  139  K 4.6  --  3.8  CL 100  --  101  CO2 23  --  29  GLUCOSE 90  --  80  BUN 22  --  16  CREATININE 6.75*  --  4.27*  CALCIUM 9.0  --  8.5  MG  --  1.7  --    CBC:  Recent Labs Lab 06/30/13 0742  WBC 9.3  NEUTROABS 3.3  HGB 12.1*  HCT 39.0  MCV 88.2  PLT 253   Cardiac Enzymes:  Recent Labs Lab 06/30/13 0806  TROPONINI <0.30   Coagulation:  Recent Labs Lab 06/29/13 06/30/13 0742 07/01/13 1436 07/02/13 0712  LABPROT  --  19.2* 19.5* 22.8*  INR 2.1 1.67* 1.70* 2.09*    Studies/Results: No  results found. Medications: I have reviewed the patient's current medications. Scheduled Meds: . amiodarone  400 mg Oral BID  . atorvastatin  20 mg Oral q1800  . calcium acetate  667 mg Oral TID WC  . diltiazem  180 mg Oral Daily  . levothyroxine  100 mcg Oral QAC breakfast  . multivitamin  1 tablet Oral QHS  . pantoprazole  40 mg Oral BID AC  . sodium chloride  3 mL Intravenous Q12H   Continuous Infusions:  PRN Meds:.sodium chloride, sodium chloride, acetaminophen, acetaminophen, feeding supplement (NEPRO CARB STEADY), heparin, hydrOXYzine, lidocaine (PF), lidocaine-prilocaine, meclizine, pentafluoroprop-tetrafluoroeth Assessment/Plan: Principal Problem:   Dysphagia Active Problems:   ESRD on dialysis   Uncontrolled atrial flutter  The patient is a 79 yo man, history of recent CABG, and dysphagia x2 months, presenting with chronic dysphagia.   # Chronic dysphagia 2/2 esophageal stricture - the patient notes a 9-month history of dysphagia, since intubation and VDRF in July 2014. Chart review of SLP notes reveals patient initially had signs of aspiration on swallow 7/18 (made NPO), with fiberoptic endoscopic evaluation 7/18 showing mild oral and pharyngeal phase dysphagia (adv to dys 3 w/nectar  thick). By 7/23, dysphagia had improved somewhat (adv to dys 3 w/thin), and by re-evaluation 7/25 pt had no signs of aspiration (adv to regular w/thin). Chart review reveals weight loss over the last few months (was 218 on 7/30, now 198 on 8/20). Given description of "food getting stuck" in his throat, I'm more concerned about an esophageal cause of dysphagia, such as esophageal stricture/ring vs motility disorder vs esophagitis  -SLP rec dys 3 diet -GI says that the patient most likely to esophageal stricture. Could possibly perform a dilation, but patient does not want this as he believes that the risks outweigh the benefits at this time.  # Atrial Flutter - the patient was noted to have  afib/aflutter during his hospitalization 05/2013, started on coumadin and diltiazem. Patient in aflutter with HR in 110's-131, asymptomatic. HR increased after dialysis with a corresponding decrease in BP. Patient has been orthostatic. After 250 cc fluid bolus, BP improved to 100's-120 SBP. The patients low BP and elevated HR may in part by 2/2 hypovolemia after dialysis . May consider adjusting patients dry weight. Per cards recs, continue Dilt 180 mg ER qd on 8/29 and Amiodorone 400 mg BID. -coumadin Held for upcoming AVF placement next week  INR = 2.09 on 8/28, 1.67 8/26 (was 2.1 8/25)   # ESRD - on HD intermittently since 05/2013, permanently since 06/2013. Pt has TTS HD in [facility]   -Nephrology consulted, appreciate recs   # Pruritus - Likely 2/2 to uremic puritus. Plan to optimize dialysis and start hydroxyzine 25 mg tid prn.   # Suicidal Ideation -  the patient noted SI to the nurse on admission. Patient says that he was frustrated with all the questions yesterday and simply said yes to everything. -place on suicide precautions, with 1:1 sitter while evaluated further -consulted psych: Rec no medication. State patient is not at risk to self or others.  - On 8/27 the patient denies SI/HI.  He did not hear or understand the question yesterday. Today he hears and understands my questions ans denies SI/HI. - Psych evaluated the patient and states that he is not a danger to himself or others and is safe to discharge from a psychiatric perspective.  # Hypothyroidism - started on synthroid 05/27/2013 due to an elevated TSH. Recheck of TSH 8/20 was 6.57, though it can take up to 6 weeks to see improvement in TSH.  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 . Will use SCD's for DVT prophy. INR 2.09 on 8/28 and trending up.  Dispo: Disposition is deferred at this time, awaiting improvement of current medical problems.  Anticipated discharge  in approximately 1-2 day(s).  The patient does have a current PCP (Toma Deiters, MD) and does not need an [facility] hospital follow-up appointment after discharge.  The patient does have transportation limitations that hinder transportation to clinic appointments.  .Services Needed at time of discharge: Y = Yes, Blank = No PT:   OT:   RN:   Equipment:   Other:     LOS: 3 days   Pleas Koch, MD 07/03/2013, 7:24 AM

## 2013-07-03 NOTE — Discharge Summary (Signed)
Name: [name] MRN: [id number] DOB: August 15, 1934 79 y.o. PCP: Toma Deiters, MD  Date of Admission: 06/30/2013  6:37 AM Date of Discharge: 07/03/2013 Attending Physician: Debe Coder, MD Discharge Diagnosis:  1. Uncontrolled Atrial flutter 2. Orthostatic hypotension 3. Suicidal ideation 4. Chronic dysphagia 5. Esophageal stricture  6. ESRD with HD 7. Pruritus 8. Hypothyroidism  Discharge Medications:   Medication List    STOP taking these medications       diltiazem 360 MG 24 hr capsule  Commonly known as:  TIAZAC     warfarin 2.5 MG tablet  Commonly known as:  COUMADIN      TAKE these medications       acetaminophen 325 MG tablet  Commonly known as:  TYLENOL  Take 1-2 tablets (325-650 mg total) by mouth every 6 (six) hours as needed for pain.     amiodarone 400 MG tablet  Commonly known as:  PACERONE  Take 1 tablet (400 mg total) by mouth 2 (two) times daily.     atorvastatin 20 MG tablet  Commonly known as:  LIPITOR  Take 1 tablet (20 mg total) by mouth daily at 6 PM.     calcium acetate 667 MG capsule  Commonly known as:  PHOSLO  Take 1 capsule (667 mg total) by mouth 3 (three) times daily with meals.     diltiazem 180 MG 24 hr capsule  Commonly known as:  CARDIZEM CD  Take 1 capsule (180 mg total) by mouth daily.     DSS 100 MG Caps  Take 100 mg by mouth daily.     feeding supplement Liqd  Take 1 Container by mouth 3 (three) times daily between meals.     hydrOXYzine 25 MG tablet  Commonly known as:  ATARAX/VISTARIL  Take 1 tablet (25 mg total) by mouth 3 (three) times daily as needed for itching.     levothyroxine 100 MCG tablet  Commonly known as:  SYNTHROID, LEVOTHROID  Take 1 tablet (100 mcg total) by mouth daily before breakfast.     meclizine 25 MG tablet  Commonly known as:  ANTIVERT  Take 25 mg by mouth 3 (three) times daily.     multivitamin Tabs tablet  Take 1 tablet by mouth daily.     ondansetron 4 MG tablet  Commonly  known as:  ZOFRAN  Take 1 tablet (4 mg total) by mouth every 8 (eight) hours as needed for nausea.     pantoprazole 40 MG tablet  Commonly known as:  PROTONIX  Take 1 tablet (40 mg total) by mouth daily.        Disposition and follow-up:   [name] was discharged from [facility] in Stable condition.  At the hospital follow up visit please address:  1.  Please ensure medical compliance with his Cardizem and Amiodarone. He has an outpatient appt set up.   2.  Labs / imaging needed at time of follow-up: TSH, renal function panel and EKG ( QTc since he is on Amiodarone)  3.  Please ensure that he follows [facility] with [facility].   4. Please repeat orthostatic VS  Follow-up Appointments:     Follow-up Information   Follow up with [facility] A, MD On 07/07/2013. (10.00 am)    Specialty:  Internal Medicine   Contact information:   [address] [place] [address] [phone]       Follow up with Antoine Poche, MD On 07/29/2013. (11:00  AM)    Contact information:   [employer] @ [place] [phone]      Discharge Instructions: Discharge Orders   Future Appointments Provider Department Dept Phone   07/29/2013 11:00 AM Antoine Poche, MD [place] [address] [place] ([place]) [phone]   07/29/2013 3:00 PM [facility] [phone]   07/29/2013 3:30 PM Chuck Hint, MD [facility] [phone]   Future Orders Complete By Expires   (HEART FAILURE PATIENTS) Call MD:  Anytime you have any of the following symptoms: 1) 3 pound weight gain in 24 hours or 5 pounds in 1 week 2) shortness of breath, with or without a dry hacking cough 3) swelling in the hands, feet or stomach 4) if you have to sleep on extra pillows at night in order to breathe.  As directed    Call MD for:  difficulty breathing, headache or visual disturbances  As directed    Call MD for:  extreme  fatigue  As directed    Call MD for:  persistant dizziness or light-headedness  As directed    Call MD for:  persistant nausea and vomiting  As directed    Diet - low sodium heart healthy  As directed    Discharge instructions  As directed    Comments:     You have been diagnosed with esophageal spasm. You will follow up with your gastroenterologist regarding this issue. Please see the attached information about esophageal spasm.  You also continue to have a fast heart rate. This, along with your dialysis, has caused your blood pressure to be low at times. We have modified your medicines to account for this.   Increase activity slowly  As directed       Consultations: Treatment Team:  Lauris Poag, MD Florencia Reasons, MD Nehemiah Settle, MD Sherren Kerns, MD [facility], MD  Procedures Performed:  Dg Neck Soft Tissue  06/30/2013   *RADIOLOGY REPORT*  Clinical Data: Nausea, dysphasia  NECK SOFT TISSUES - 1+ VIEW  Comparison: None.  Findings: Lateral neck soft tissues are unremarkable.  No prevertebral soft tissue swelling.  Degenerative changes of the visualized cervical spine.  The patient is edentulous.  IMPRESSION: Unremarkable lateral neck soft tissues.   Original Report Authenticated By: Charline Bills, M.D.   Dg Chest 2 View  06/30/2013   *RADIOLOGY REPORT*  Clinical Data: Dysphasia, nausea  CHEST - 2 VIEW  Comparison: 06/24/2013  Findings: Bilateral lower lobe scarring and/or atelectasis. No pleural effusion or pneumothorax.  The heart is normal in size. Postsurgical changes related to prior CABG.  Stable right subclavian dual lumen central venous catheter.  IMPRESSION: No evidence of acute cardiopulmonary disease.   Original Report Authenticated By: Charline Bills, M.D.   Dg Chest 2 View  06/24/2013   *RADIOLOGY REPORT*  Clinical Data: History of CABG, some chest pain and nausea  CHEST - 2 VIEW  Comparison: Chest x-ray of 05/31/2013  Findings: Only mild  basilar atelectasis and/or scarring remains. No effusion is seen.  Cardiomegaly is stable.  A right central venous line is unchanged in position.  Median sternotomy sutures are noted from prior CABG.  IMPRESSION: Mild bibasilar linear atelectasis and/or scarring.  Stable cardiomegaly.   Original Report Authenticated By: Dwyane Dee, M.D.   Ct Head Wo Contrast  06/30/2013   CLINICAL DATA:  Difficulty swallowing. Frontal headache.  EXAM: CT HEAD WITHOUT CONTRAST  TECHNIQUE: Contiguous axial images were obtained from  the base of the skull through the vertex without intravenous contrast.  COMPARISON:  MRI 03/01/2011.  FINDINGS: Mild age related volume loss. No acute intracranial abnormality. Specifically, no hemorrhage, hydrocephalus, mass lesion, acute infarction, or significant intracranial injury. No acute calvarial abnormality. Visualized paranasal sinuses and mastoids clear. Orbital soft tissues unremarkable.  IMPRESSION: No acute intracranial abnormality.   Electronically Signed   By: Charlett Nose   On: 06/30/2013 08:41   Dg Esophagus  07/01/2013   *RADIOLOGY REPORT*  Clinical Data:Dysphagia.  ESOPHAGUS/BARIUM SWALLOW/TABLET STUDY  Fluoroscopy Time: 1 minute and 19 seconds.  Comparison: None.  Findings: Initial barium swallows demonstrate normal pharyngeal motion with swallowing.  No laryngeal penetration or aspiration. No upper esophageal webs, strictures or diverticuli.  Esophageal dysmotility with disruption of the primary peristaltic wave and frequent and coordinated tertiary contractions/diffuse esophageal spasm.  No obvious mass or stricture.  A 13 mm barium pill briefly hung up in the distal esophagus.  No hiatal hernia or gastroesophageal reflux was demonstrated.  IMPRESSION:  1.  Esophageal dysmotility with probable diffuse esophageal spasm. 2.  No esophageal mass or stricture. 3.  No hiatal hernia or GE reflux.   Original Report Authenticated By: Rudie Meyer, M.D.   US Renal  06/08/2013    *RADIOLOGY REPORT*  Clinical Data: No acute kidney injury.  Evaluate for obstruction.  RENAL/URINARY TRACT ULTRASOUND COMPLETE  Comparison:  CT 05/23/2007  Findings:  Right Kidney:  Right kidney is mildly echogenic. It is normal in overall size.  It measures 12.7 cm in length. There is a hypo or anechoic focus arising from the cortical margin of the lower pole that is likely a cyst.  It measures 14 mm in size.  No convincing solid mass and no definite stone is seen.  Left Kidney:  Left kidney shows mild cortical thinning and increased renal parenchymal echogenicity. Small hypoechoic to anechoic lesion in the upper pole is nonspecific but likely a cyst. It measures 1 cm in greatest dimension.  No convincing solid renal mass and no stone.  No hydronephrosis.  Bladder:  Bladder is minimally distended.  Neither ureteral jet was visualized.  No bladder mass or stone.  IMPRESSION: Findings are consistent with medical renal disease with increased renal parenchymal echogenicity bilaterally and renal cortical thinning on the left.  There are probable bilateral renal cysts too small to fully characterize on ultrasound.  No hydronephrosis.   Original Report Authenticated By: Amie Portland, M.D.   Admission HPI:  The patient is a 79 yo man, history of NSTEMI s/p CABG (05/13/13), HTN, paroxysmal afib, presenting with dysphagia. The patient notes dysphagia, present since CABG 05/13/13, which has been unchanged since that time. He describes this as a sensation of solid food "getting caught" in the back of his throat, with associated nausea, but no regurgitation, vomiting, or coughing. He has been able to tolerate liquids and soft foods (creamed potatoes, noodles) without difficulty. Since symptoms have not improved, the patient presented for evaluation. He notes no chest pain, palpitations, cough, SOB, congestion, rhinorrhea, sore throat, fevers, chills, or abdominal pain.  The patient was hospitalized 7/8-7/29 with NSTEMI, with  cardiac cath showing severe 4-vessel disease, requiring CABG x4 on 7/9. That hospital course was complicated by cardiogenic shock, leading to ventilator-dependent respiratory failure, as well as discovery of new afib/aflutter, with initiation of Coumadin. Echo 7/24 showed an EF 45-50%. The patient also experienced acute on chronic renal failure that admission, requiring HD. The patient was discharged to inpatient rehab, but was readmitted  8/8 due to worsening renal failure, requiring initiation of permanent HD. The patient underwent the CLIP process, and was discharged 8/12. He subsequently presented to his Cardiothoracic surgeon for follow-up 8/20, who noted him to be in aflutter with HR 130-150, and referred him to the ED, where he was evaluated, and thought to be mildly volume depleted. The patient presents today, still in atrial flutter, though asymptomatic, with BP in the 110-120's.   Hospital Course by problem list:  The patient is a 79 yo man, history of recent CABG, and dysphagia x2 months, presenting with chronic dysphagia.   # Uncontrolled Atrial flutter    The patient was noted to have afib/aflutter during his hospitalization 05/2013, when he was started on coumadin and diltiazem. During this admission, Patient continued to have asymptomatic Atrial aflutter with HR in 110's-131. The etiology of his RVR could be associated with his low BP during the HD. He is also positive for orthostatic. Cardiology was consulted and decreased Cardizem to 180 mg ER daily. And Amiodorone 400 mg BID was added.  [facility] Cardiologist discussed with patient and family, and instructed for him to continue above medications. Patient will follow up with [facility] as an outpatient for evaluation of cardioversion. Of note, coumadin Held for upcoming AVF placement next week INR = 2.09 on 8/28, 1.67 8/26 (was 2.1 8/25)   Orthostatic hypotension Patient is noted to have orthostatic vital signs. His medications were changed as  above. He will need a repeat orthostatic VS at follow up appointment as outpatient  Suicidal ideation    The patient was noted to have SI by the ED nurse on admission. Patient stated that he was frustrated with all the questions and simply said yes to everything. Patient was put on suicide precaution and Psychiatrist evaluated the patient and indicated that he is not a danger to himself or others and is safe to be discharged from a psychiatric perspective.   Chronic dysphagia 2/2 esophageal stricture  The patient presented with a 17-month history of dysphagia since intubation and VDRF in July 2014. Chart review of SLP notes revealed that patient initially had signs of aspiration and underwent the fiberoptic endoscopic evaluation on 7/18, which showed mild oral and pharyngeal phase dysphagia. He was initially on dysphagia 3 diet with nectar thick liquid. His condition continued to improve and he was discharged on Regular diet with regular thin liquid. However, he continued to have dysphagia and was admitted for evaluation.   [facility] was consulted. Patient's Barrium swallow showed  "Esophageal dysmotility with probable diffuse esophageal spasm". [facility] recommended conservative modalities (such as soft food, chopped meat, small boluses of food, alternating food/liquids during a meal). If not effective, a trial of Botox or CCB could be considered by GI. Patient is stable and will be discharged home with a close follow up with the [facility].   ESRD with HD, stable.   Pruritus -likely uremic puritus.hydroxyzine 25 mg tid prn  Hypothyroidism Patient was started on synthroid 05/27/2013. Recheck of TSH on 06/24/13 was 6.57, though it can take up to 6 weeks to see improvement in TSH. continue synthroid 100 mcg daily for now and follow up with PCP as outpatient.    Discharge Vitals:   BP 100/75  Pulse 114  Temp(Src) 98.1 F (36.7 C) (Oral)  Resp 18  Ht 6\' 1"  (1.854 m)  Wt 198 lb 3.2 oz (89.903 kg)   BMI 26.16 kg/m2  SpO2 92%  Discharge Labs:  Results for orders  placed during the hospital encounter of 06/30/13 (from the past 24 hour(s))  BASIC METABOLIC PANEL     Status: Abnormal   Collection Time    07/03/13  4:20 AM      Result Value Range   Sodium 139  135 - 145 mEq/L   Potassium 3.8  3.5 - 5.1 mEq/L   Chloride 101  96 - 112 mEq/L   CO2 29  19 - 32 mEq/L   Glucose, Bld 80  70 - 99 mg/dL   BUN 16  6 - 23 mg/dL   Creatinine, Ser 1.61 (*) 0.50 - 1.35 mg/dL   Calcium 8.5  8.4 - 09.6 mg/dL   GFR calc non Af Amer 12 (*) >90 mL/min   GFR calc Af Amer 14 (*) >90 mL/min    Signed: Dede Query, MD 07/03/2013, 4:39 PM   Time Spent on Discharge:  30 minutes Services Ordered on Discharge: none Equipment Ordered on Discharge: none

## 2013-07-03 NOTE — Progress Notes (Signed)
Speech Language Pathology Dysphagia Treatment Patient Details Name: [name] MRN: [id number] DOB: [birth year] Today's Date: 07/03/2013 Time: 1130-1200 SLP Time Calculation (min): 30 min  Assessment / Plan / Recommendation Clinical Impression  Results of Barium Swallow revealed no penetration or aspiration.  Esophageal dysmotility was identified.  Pt was seen at bedside, with wife and son present. Reviewed and provided written suggestions for managing esophageal dysmotility.  Opportunity was given to ask questions.  No further ST intervention is recommended at this time. Please reconsult if needs arise.    Diet Recommendation  Continue with Current Diet: Dysphagia 3 (mechanical soft);Thin liquid    SLP Plan All goals met   Pertinent Vitals/Pain No pain reported   Swallowing Goals  SLP Swallowing Goals Patient will consume recommended diet without observed clinical signs of aspiration with: Modified independent assistance Swallow Study Goal #1 - Progress: Met Patient will utilize recommended strategies during swallow to increase swallowing safety with: Modified independent assistance Swallow Study Goal #2 - Progress: Met  General Temperature Spikes Noted: No Respiratory Status: Room air Behavior/Cognition: Alert;Cooperative;Pleasant mood Oral Cavity - Dentition: Edentulous Patient Positioning: Partially reclined  Oral Cavity - Oral Hygiene Does patient have any of the following "at risk" factors?: None of the above Brush patient's teeth BID with toothbrush (using toothpaste with fluoride): Yes   Dysphagia Treatment Treatment focused on: Patient/family/caregiver education Family/Caregiver Educated: wife, son Patient observed directly with PO's: No Reason PO's not observed:  (normal oropharyngeal swallow on BaSw)   GO Functional Assessment Tool Used: skilled clinical judgment Functional Limitations: Swallowing Swallow Current Status (W2956): At least 1 percent but less  than 20 percent impaired, limited or restricted Swallow Goal Status [phone]): At least 1 percent but less than 20 percent impaired, limited or restricted Swallow Discharge Status [phone]): At least 1 percent but less than 20 percent impaired, limited or restricted   Leigh Aurora 07/03/2013, 12:03 PM  [name], CCC-SLP [phone]

## 2013-07-03 NOTE — Progress Notes (Signed)
SUBJECTIVE:  [name] is a 79 y/o male with h/o CABG in July 2014, HTN, and PAF.  He is also a current HD patient for ESRD. He currently denies any symptoms and states that he is not feeling lightheaded or dizzy anymore and that he is ready to go home.  He denies any chest pain, dyspnea, N/V, fatigue, or any new symptoms.  He also denies feeling lightheaded when sitting up or standing.  He denies palpitations or feeling of heart racing. He has no complaints at this time.   New medical tx (amiodarone 400mg  BID and diltiazem 180mg ) was started yesterday for rate control of his atrial flutter.  His HR is currently 115 and telemetry shows his HR stayed consistent last night at 120-125.    PHYSICAL EXAM Filed Vitals:   07/02/13 1639 07/02/13 1641 07/02/13 2056 07/03/13 0353  BP: 74/50 71/52 109/68 106/74  Pulse: 111 119 112 112  Temp:   98.3 F (36.8 C) 97.7 F (36.5 C)  TempSrc:   Oral Oral  Resp:   18 18  Height:      Weight:   198 lb 3.2 oz (89.903 kg)   SpO2:   96% 95%   GENERAL:  Well appearing HEENT:  Pupils equal round and reactive, fundi not visualized, oral mucosa unremarkable NECK:  No jugular venous distention, waveform within normal limits, carotid upstroke brisk and symmetric, no bruits, no thyromegaly LYMPHATICS:  No cervical, inguinal adenopathy LUNGS:  Clear to auscultation bilaterally BACK:  No CVA tenderness CHEST:  Unremarkable HEART:  PMI not displaced or sustained,S1 and S2 within normal limits, no S3, no S4, no clicks, no rubs, no murmurs. HR >100 but generally regular to auscultation. ABD:  Flat, positive bowel sounds normal in frequency in pitch, no bruits, no rebound, no guarding, no midline pulsatile mass, no hepatomegaly, no splenomegaly EXT:  2 plus pulses throughout, no edema, no cyanosis no clubbing SKIN:  No rashes no nodules NEURO:  Cranial nerves II through XII grossly intact, motor grossly intact throughout PSYCH:  Cognitively intact, oriented to  person place and time  LABS: Lab Results  Component Value Date   CKTOTAL 29 03/01/2011   CKMB 1.4 03/01/2011   TROPONINI <0.30 06/30/2013   Results for orders placed during the hospital encounter of 06/30/13 (from the past 24 hour(s))  BASIC METABOLIC PANEL     Status: Abnormal   Collection Time    07/03/13  4:20 AM      Result Value Range   Sodium 139  135 - 145 mEq/L   Potassium 3.8  3.5 - 5.1 mEq/L   Chloride 101  96 - 112 mEq/L   CO2 29  19 - 32 mEq/L   Glucose, Bld 80  70 - 99 mg/dL   BUN 16  6 - 23 mg/dL   Creatinine, Ser 1.61 (*) 0.50 - 1.35 mg/dL   Calcium 8.5  8.4 - 09.6 mg/dL   GFR calc non Af Amer 12 (*) >90 mL/min   GFR calc Af Amer 14 (*) >90 mL/min    Intake/Output Summary (Last 24 hours) at 07/03/13 0902 Last data filed at 07/02/13 1904  Gross per 24 hour  Intake    300 ml  Output   1567 ml  Net  -1267 ml    EKG:    ASSESSMENT AND PLAN: 79 yo male with hx NSTEMI/CABG 05/2013 and  complicated post-op course was admitted 8/26 with dysphagia secondary to severe dysmotility and esophageal spasm. He  has ESRD on HD. He was in atrial flutter, seen by cards 8/28.    Principal Problem:   Dysphagia Active Problems:   ESRD on dialysis   Uncontrolled atrial flutter - U/A to continue home dose of Cardizem (360 mg) due to low BP during HD. Diltiazem decreased to 180 mg and amiodarone added at 400 BID on 8/28. HR still elevated,     [name] 07/03/2013 9:02 AM   History and all data above reviewed.  Patient examined.  I agree with the findings as above.  He just wants to go home.  He denies any pain and he is not noticing the palpitatoinsThe patient exam reveals [id number]  ,  Lungs: Clear  ,  Abd: Positive bowel sounds, no rebound no guarding, Ext No edema  .  All available labs, radiology testing, previous records reviewed. Agree with documented assessment and plan. At this point our options are limited.  We cannot titrate his meds futher.  I discussed this  with the patient and his family.  He should continue the meds as listed and we will schedule follow up in our clinic for eventual cardioversion.   [name]  12:29 PM  07/03/2013

## 2013-07-03 NOTE — Progress Notes (Signed)
Assessment:  1 ESRD  2 Aflutter  3 Dysphagia due to distal esophageal stricture  4 Depression  5 ? Heparin allergy  Plan: HD TTS< to have op AV access next week Prob not heparin allergic and pharmacy investigating.  Cont. HD TTS  Subjective: Interval History: still symptomatic orthostatic  Objective: Vital signs in last 24 hours: Temp:  [97.7 F (36.5 C)-98.6 F (37 C)] 97.7 F (36.5 C) (08/29 0353) Pulse Rate:  [105-131] 112 (08/29 0353) Resp:  [18-22] 18 (08/29 0353) BP: (68-122)/(36-84) 106/74 mmHg (08/29 0353) SpO2:  [95 %-100 %] 95 % (08/29 0353) Weight:  [85.3 kg (188 lb 0.8 oz)-89.903 kg (198 lb 3.2 oz)] 89.903 kg (198 lb 3.2 oz) (08/28 2056) Weight change: 0.699 kg (1 lb 8.6 oz)  Intake/Output from previous day: 08/28 0701 - 08/29 0700 In: 300 [P.O.:300] Out: 1567  Intake/Output this shift:    General appearance: alert and cooperative Resp: clear to auscultation bilaterally Cardio: regular rate and rhythm, S1, S2 normal, no murmur, click, rub or gallop Extremities: extremities normal, atraumatic, no cyanosis or edema  Lab Results: No results found for this basename: WBC, HGB, HCT, PLT,  in the last 72 hours BMET:  Recent Labs  07/03/13 0420  NA 139  K 3.8  CL 101  CO2 29  GLUCOSE 80  BUN 16  CREATININE 4.27*  CALCIUM 8.5   No results found for this basename: PTH,  in the last 72 hours Iron Studies: No results found for this basename: IRON, TIBC, TRANSFERRIN, FERRITIN,  in the last 72 hours Studies/Results: No results found.  Scheduled: . amiodarone  400 mg Oral BID  . atorvastatin  20 mg Oral q1800  . calcium acetate  667 mg Oral TID WC  . diltiazem  180 mg Oral Daily  . levothyroxine  100 mcg Oral QAC breakfast  . multivitamin  1 tablet Oral QHS  . pantoprazole  40 mg Oral BID AC  . sodium chloride  3 mL Intravenous Q12H     LOS: 3 days   Monico Sudduth C 07/03/2013,8:17 AM  \

## 2013-07-07 ENCOUNTER — Encounter (HOSPITAL_COMMUNITY): Payer: Self-pay | Admitting: *Deleted

## 2013-07-07 ENCOUNTER — Other Ambulatory Visit: Payer: Self-pay | Admitting: *Deleted

## 2013-07-07 DIAGNOSIS — Z01810 Encounter for preprocedural cardiovascular examination: Secondary | ICD-10-CM

## 2013-07-07 DIAGNOSIS — N186 End stage renal disease: Secondary | ICD-10-CM

## 2013-07-07 MED ORDER — CEFUROXIME SODIUM 1.5 G IJ SOLR
1.5000 g | INTRAMUSCULAR | Status: AC
  Administered 2013-07-08: 1.5 g via INTRAVENOUS
  Filled 2013-07-07: qty 1.5

## 2013-07-07 NOTE — Progress Notes (Signed)
Anesthesia Chart Review:  Patient is a 79 year old male scheduled for insertion of LUE AVGG on 07/09/13 by Dr. Darrick Penna.  He is scheduled to be a same day work-up.  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 He was recently started on levothyroxine for hypothyroidism. PCP is Dr. Lia Hopping.  Cardiologist is going to be Dr. Wyline Mood with [facility].  He was last seen by Dr. [name] and Dr. Antoine Poche during his recent hospitalization on 06/30/13 - 07/03/13 for uncontrolled atrial flutter, dysphagia (refused EGD/dilation), suicidal ideation.  According to Dr. Elvis Coil note on 07/02/13, "Coumadin will be held for pending AV graft with vascular surgery next Wed. If after 4 wks of therapeutic INR, he remains in aflutter, we will plan to pursue outpt DCCV. We have arranged for f/u with Dr. Wyline Mood in our [place] office on 9/24 at 11 AM."  Echo on 05/28/13 showed:  - Left ventricle: Septal and mid and basal inferior wall hypokinesis The cavity size was mildly dilated. Wall thickness was normal. Systolic function was mildly reduced. The estimated ejection fraction was in the range of 45% to 50%. - Left atrium: The atrium was mildly dilated. - Impressions: Overall image quality for study is poor.  His last cardiac cath was on 05/12/13 prior to his CABG (see report under Notes tab).   CT of the chest and abdomen without contrast on 05/12/13 showed coronary calcifications, dilated ascending thoracic aorta measuring up to 4 X 3.9 cm, infrarenal AAA maximal at the level of  the takeoff of the IMA measuring 4.7 X 4.9 cm, limited evaluation for dissection without contrast, evidence of a small chronic focal dissection proximal right CIA unchanged from 2008, 2.3 hyperdense structure within the right kidney, 8mm exophytic structure on the left kidney, fatty infiltration of the liver.  CXR on 06/30/13 showed no evidence of acute cardiopulmonary disease.  He is for repeat EKG and labs on the day of surgery.  Further evaluation on the day of surgery by his assigned anesthesiologist.  Hopefully if his afib/flutter is rate controlled and otherwise no acute symptoms then he can proceed as planned.  Velna Ochs [facility] Short Stay Center/Anesthesiology Phone [phone] 07/07/2013 12:06 PM    ,

## 2013-07-07 NOTE — Progress Notes (Signed)
07/07/13 1711  OBSTRUCTIVE SLEEP APNEA  Have you ever been diagnosed with sleep apnea through a sleep study? No  Do you snore loudly (loud enough to be heard through closed doors)?  0  Do you often feel tired, fatigued, or sleepy during the daytime? 1  Has anyone observed you stop breathing during your sleep? 0  Do you have, or are you being treated for high blood pressure? 1  BMI more than 35 kg/m2? 0  Age over 77 years old? 1  Gender: 1  Obstructive Sleep Apnea Score 4

## 2013-07-08 ENCOUNTER — Encounter (HOSPITAL_COMMUNITY)
Admission: RE | Disposition: A | Discharge status: Home / Self Care | Payer: Self-pay | Source: Ambulatory Visit | Attending: Vascular Surgery

## 2013-07-08 ENCOUNTER — Ambulatory Visit (HOSPITAL_COMMUNITY)
Admission: RE | Admit: 2013-07-08 | Discharge: 2013-07-08 | Disposition: A | Discharge status: Home / Self Care | Payer: Medicare Other | Source: Ambulatory Visit | Attending: Vascular Surgery | Admitting: Vascular Surgery

## 2013-07-08 ENCOUNTER — Encounter (HOSPITAL_COMMUNITY): Payer: Self-pay | Admitting: Vascular Surgery

## 2013-07-08 ENCOUNTER — Encounter (HOSPITAL_COMMUNITY): Payer: Self-pay | Admitting: *Deleted

## 2013-07-08 ENCOUNTER — Ambulatory Visit (HOSPITAL_COMMUNITY): Payer: Medicare Other | Admitting: Vascular Surgery

## 2013-07-08 DIAGNOSIS — I2510 Atherosclerotic heart disease of native coronary artery without angina pectoris: Secondary | ICD-10-CM | POA: Insufficient documentation

## 2013-07-08 DIAGNOSIS — I509 Heart failure, unspecified: Secondary | ICD-10-CM | POA: Insufficient documentation

## 2013-07-08 DIAGNOSIS — E785 Hyperlipidemia, unspecified: Secondary | ICD-10-CM | POA: Insufficient documentation

## 2013-07-08 DIAGNOSIS — C61 Malignant neoplasm of prostate: Secondary | ICD-10-CM | POA: Insufficient documentation

## 2013-07-08 DIAGNOSIS — K219 Gastro-esophageal reflux disease without esophagitis: Secondary | ICD-10-CM | POA: Insufficient documentation

## 2013-07-08 DIAGNOSIS — I4891 Unspecified atrial fibrillation: Secondary | ICD-10-CM | POA: Insufficient documentation

## 2013-07-08 DIAGNOSIS — Z888 Allergy status to other drugs, medicaments and biological substances status: Secondary | ICD-10-CM | POA: Insufficient documentation

## 2013-07-08 DIAGNOSIS — N186 End stage renal disease: Secondary | ICD-10-CM | POA: Insufficient documentation

## 2013-07-08 DIAGNOSIS — I739 Peripheral vascular disease, unspecified: Secondary | ICD-10-CM | POA: Insufficient documentation

## 2013-07-08 DIAGNOSIS — Z79899 Other long term (current) drug therapy: Secondary | ICD-10-CM | POA: Insufficient documentation

## 2013-07-08 DIAGNOSIS — M069 Rheumatoid arthritis, unspecified: Secondary | ICD-10-CM | POA: Insufficient documentation

## 2013-07-08 DIAGNOSIS — Z87891 Personal history of nicotine dependence: Secondary | ICD-10-CM | POA: Insufficient documentation

## 2013-07-08 DIAGNOSIS — Z8673 Personal history of transient ischemic attack (TIA), and cerebral infarction without residual deficits: Secondary | ICD-10-CM | POA: Insufficient documentation

## 2013-07-08 DIAGNOSIS — I120 Hypertensive chronic kidney disease with stage 5 chronic kidney disease or end stage renal disease: Secondary | ICD-10-CM | POA: Insufficient documentation

## 2013-07-08 DIAGNOSIS — I252 Old myocardial infarction: Secondary | ICD-10-CM | POA: Insufficient documentation

## 2013-07-08 HISTORY — DX: Hypothyroidism, unspecified: E03.9

## 2013-07-08 HISTORY — PX: AV FISTULA PLACEMENT: SHX1204

## 2013-07-08 HISTORY — DX: Constipation, unspecified: K59.00

## 2013-07-08 LAB — POCT I-STAT 4, (NA,K, GLUC, HGB,HCT)
Glucose, Bld: 93 mg/dL (ref 70–99)
HCT: 40 % (ref 39.0–52.0)
Hemoglobin: 13.6 g/dL (ref 13.0–17.0)

## 2013-07-08 SURGERY — INSERTION OF ARTERIOVENOUS (AV) GORE-TEX GRAFT ARM
Anesthesia: Monitor Anesthesia Care | Site: Arm Lower | Laterality: Left | Wound class: Clean

## 2013-07-08 MED ORDER — HEPARIN SODIUM (PORCINE) 1000 UNIT/ML IJ SOLN
INTRAMUSCULAR | Status: DC | PRN
  Administered 2013-07-08: 5000 [IU] via INTRAVENOUS

## 2013-07-08 MED ORDER — MIDAZOLAM HCL 5 MG/5ML IJ SOLN
INTRAMUSCULAR | Status: DC | PRN
  Administered 2013-07-08: 2 mg via INTRAVENOUS

## 2013-07-08 MED ORDER — THROMBIN 20000 UNITS EX SOLR
CUTANEOUS | Status: AC
  Filled 2013-07-08: qty 20000

## 2013-07-08 MED ORDER — PROPOFOL INFUSION 10 MG/ML OPTIME
INTRAVENOUS | Status: DC | PRN
  Administered 2013-07-08: 75 ug/kg/min via INTRAVENOUS

## 2013-07-08 MED ORDER — OXYCODONE HCL 5 MG PO TABS: 5.0000 mg | ORAL_TABLET | ORAL | Status: DC | PRN

## 2013-07-08 MED ORDER — SODIUM CHLORIDE 0.9 % IV SOLN
INTRAVENOUS | Status: DC | PRN
  Administered 2013-07-08: 08:00:00 via INTRAVENOUS

## 2013-07-08 MED ORDER — LIDOCAINE HCL (PF) 1 % IJ SOLN
INTRAMUSCULAR | Status: DC | PRN
  Administered 2013-07-08: 17 mL via INTRADERMAL

## 2013-07-08 MED ORDER — PROTAMINE SULFATE 10 MG/ML IV SOLN
INTRAVENOUS | Status: DC | PRN
  Administered 2013-07-08: 50 mg via INTRAVENOUS

## 2013-07-08 MED ORDER — HEPARIN SODIUM (PORCINE) 1000 UNIT/ML IJ SOLN: 1000.0000 [IU] | Freq: Once | INTRAMUSCULAR | Status: DC

## 2013-07-08 MED ORDER — LIDOCAINE HCL (PF) 1 % IJ SOLN
INTRAMUSCULAR | Status: AC
  Filled 2013-07-08: qty 30

## 2013-07-08 MED ORDER — 0.9 % SODIUM CHLORIDE (POUR BTL) OPTIME
TOPICAL | Status: DC | PRN
  Administered 2013-07-08: 1000 mL

## 2013-07-08 MED ORDER — SODIUM CHLORIDE 0.9 % IV SOLN: INTRAVENOUS | Status: DC

## 2013-07-08 SURGICAL SUPPLY — 34 items
ADH SKN CLS APL DERMABOND .7 (GAUZE/BANDAGES/DRESSINGS) ×1
ARMBAND PINK RESTRICT EXTREMIT (MISCELLANEOUS) ×2 IMPLANT
CANISTER SUCTION 2500CC (MISCELLANEOUS) ×2 IMPLANT
CLIP TI MEDIUM 6 (CLIP) ×2 IMPLANT
CLIP TI WIDE RED SMALL 6 (CLIP) ×2 IMPLANT
CLOTH BEACON ORANGE TIMEOUT ST (SAFETY) ×2 IMPLANT
COVER SURGICAL LIGHT HANDLE (MISCELLANEOUS) ×2 IMPLANT
DECANTER SPIKE VIAL GLASS SM (MISCELLANEOUS) ×2 IMPLANT
DERMABOND ADVANCED (GAUZE/BANDAGES/DRESSINGS) ×1
DERMABOND ADVANCED .7 DNX12 (GAUZE/BANDAGES/DRESSINGS) ×1 IMPLANT
ELECT REM PT RETURN 9FT ADLT (ELECTROSURGICAL) ×2
ELECTRODE REM PT RTRN 9FT ADLT (ELECTROSURGICAL) ×1 IMPLANT
GEL ULTRASOUND 20GR AQUASONIC (MISCELLANEOUS) IMPLANT
GLOVE BIO SURGEON STRL SZ7.5 (GLOVE) ×2 IMPLANT
GOWN PREVENTION PLUS XLARGE (GOWN DISPOSABLE) ×2 IMPLANT
GOWN STRL NON-REIN LRG LVL3 (GOWN DISPOSABLE) ×6 IMPLANT
GRAFT GORETEX STRT 4-7X45 (Vascular Products) ×1 IMPLANT
KIT BASIN OR (CUSTOM PROCEDURE TRAY) ×2 IMPLANT
KIT ROOM TURNOVER OR (KITS) ×2 IMPLANT
LOOP VESSEL MINI RED (MISCELLANEOUS) IMPLANT
NDL HYPO 25GX1X1/2 BEV (NEEDLE) ×1 IMPLANT
NEEDLE HYPO 25GX1X1/2 BEV (NEEDLE) ×2 IMPLANT
NS IRRIG 1000ML POUR BTL (IV SOLUTION) ×2 IMPLANT
PACK CV ACCESS (CUSTOM PROCEDURE TRAY) ×2 IMPLANT
PAD ARMBOARD 7.5X6 YLW CONV (MISCELLANEOUS) ×4 IMPLANT
SPONGE SURGIFOAM ABS GEL 100 (HEMOSTASIS) IMPLANT
SUT PROLENE 6 0 CC (SUTURE) ×5 IMPLANT
SUT VIC AB 3-0 SH 27 (SUTURE) ×4
SUT VIC AB 3-0 SH 27X BRD (SUTURE) ×2 IMPLANT
SUT VICRYL 4-0 PS2 18IN ABS (SUTURE) ×4 IMPLANT
TOWEL OR 17X24 6PK STRL BLUE (TOWEL DISPOSABLE) ×2 IMPLANT
TOWEL OR 17X26 10 PK STRL BLUE (TOWEL DISPOSABLE) ×2 IMPLANT
UNDERPAD 30X30 INCONTINENT (UNDERPADS AND DIAPERS) ×2 IMPLANT
WATER STERILE IRR 1000ML POUR (IV SOLUTION) ×2 IMPLANT

## 2013-07-08 NOTE — Interval H&P Note (Signed)
History and Physical Interval Note:  07/08/2013 8:24 AM  [name]  has presented today for surgery, with the diagnosis of ESRD  The various methods of treatment have been discussed with the patient and family. After consideration of risks, benefits and other options for treatment, the patient has consented to  Procedure(s): INSERTION OF ARTERIOVENOUS (AV) GORE-TEX GRAFT ARM (Left) as a surgical intervention .  The patient's history has been reviewed, patient examined, no change in status, stable for surgery.  I have reviewed the patient's chart and labs.  Questions were answered to the patient's satisfaction.     Vennie Salsbury E

## 2013-07-08 NOTE — Progress Notes (Signed)
07-09-11  1230pm  IV Team Note;  Called to PACU to d/c iv fluids to hemo cath; pt noted to have an allergy to HEPARIN in EPIC system;  Pt unaware of allergy, says "no one ever told me I had an allergy to heparin;"  Spoke w RN from PACU, who spoke w. RN in Short Stay re allergy documentation;  Also spoke with Harland German in Pharmacy for clarification;  Pt sees Dr Lowell Guitar, but Dr Hyman Hopes was called instead;  Dr Hyman Hopes spoke w. Dialysis center in [place];  [place], RN spoke w. Dr Hyman Hopes who gave order to use heparin;    2.2 cc heparin (1000 units/ml) instilled easily into red port;  Both ports clamped and capped;    Barkley Bruns RN [facility],  IV Team

## 2013-07-08 NOTE — H&P (View-Only) (Signed)
[facility] Consultation Reason for consult: hemodialysis access Requesting: Dr Powell History of Present Illness:  Patient is a 79 y.o. year old male who presents for evaluation for permanent hemodialysis access.  Other medical problems include hypertension, coronary artery disease and atrial flutter.  He dialyzes T Th Sat in [place].  He is on coumadin.  Past Medical History  Diagnosis Date  . Hypertension   . Depression   . 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 Coronary artery disease   . ESRD on dialysis 06/2013    TTS HD in [place]  . Myocardial infarction 05/2013    NSTEMI, s/p CABG 05/13/13  . Peripheral arterial disease   . Atrial flutter   . DVT (deep venous thrombosis) 02/2011    LLL  . GERD (gastroesophageal reflux disease)   . Compression fracture of cervical spine   . Rheumatoid arthritis   . Prostate cancer   . Stasis dermatitis     LLL, wears TED hose  . AAA (abdominal aortic aneurysm)   . 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 CHF (congestive heart failure)     EF 45-55% per echo 05/28/13  . Hyperlipidemia   . Dysphagia 05/2013    after prolonged intubation  . Stroke     Past Surgical History  Procedure Laterality Date  . Eye surgery    . Coronary artery bypass graft N/A 05/13/2013    Procedure: CORONARY ARTERY BYPASS GRAFTING (CABG);  Surgeon: Bryan K Bartle, MD;  Location: [facility] OR;  Service: Open Heart Surgery;  Laterality: N/A;  . Insertion of dialysis catheter N/A 05/26/2013    Procedure: INSERTION OF DIALYSIS CATHETER;  Surgeon: Billee Balcerzak E Teressa Mcglocklin, MD;  Location: [facility] OR;  Service: Vascular;  Laterality: N/A;  right IJ  . Cardiac surgery    . Cataract extraction       Social History History  Substance Use Topics  . Smoking status: Former Smoker -- 1.00 packs/day for 47 years    Quit date: 11/05/2000  . Smokeless tobacco: Never Used  . Alcohol Use: No     Comment: Previously drank 6 beers/day, quit in 1994    Family  History Mother coronary artery disease  Allergies  Allergies  Allergen Reactions  . 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 0.9 %  sodium chloride infusion  100 mL Intravenous PRN Alvin C Powell, MD      . 0.9 %  sodium chloride infusion  100 mL Intravenous PRN Alvin C Powell, MD      . acetaminophen (TYLENOL) tablet 650 mg  650 mg Oral Q6H PRN [name] K Brown, MD       Or  . acetaminophen (TYLENOL) suppository 650 mg  650 mg Rectal Q6H PRN [name] K Brown, MD      . atorvastatin (LIPITOR) tablet 20 mg  20 mg Oral q1800 [name] K Brown, MD   20 mg at 07/01/13 0146  . calcium acetate (PHOSLO) capsule 667 mg  667 mg Oral TID WC [name] K Brown, MD   667 mg at 07/01/13 1220  . diltiazem (CARDIZEM CD) 24 hr capsule 360 mg  360 mg Oral Daily Emily B Mullen, MD   360 mg at 07/01/13 1200  . feeding supplement (NEPRO CARB STEADY) liquid 237 mL  237 mL Oral   PRN Alvin C Powell, MD      . heparin injection 1,000 Units  1,000 Units Dialysis PRN Alvin C Powell, MD      . levothyroxine (SYNTHROID, LEVOTHROID) tablet 100 mcg  100 mcg Oral QAC breakfast [name] K Brown, MD      . lidocaine (PF) (XYLOCAINE) 1 % injection 5 mL  5 mL Intradermal PRN Alvin C Powell, MD      . lidocaine-prilocaine (EMLA) cream 1 application  1 application Topical PRN Alvin C Powell, MD      . meclizine (ANTIVERT) tablet 25 mg  25 mg Oral TID PRN [name] K Brown, MD      . multivitamin (RENA-VIT) tablet 1 tablet  1 tablet Oral QHS [name] K Brown, MD   1 tablet at 07/01/13 0146  . ondansetron (ZOFRAN) tablet 4 mg  4 mg Oral Q6H PRN [name] K Brown, MD       Or  . ondansetron (ZOFRAN) injection 4 mg  4 mg Intravenous Q6H PRN [name] K Brown, MD   4 mg at 06/30/13 1101  . pantoprazole (PROTONIX) EC tablet 40 mg  40 mg Oral BID AC Robert V Buccini, MD   40 mg at 07/01/13 0908  . pentafluoroprop-tetrafluoroeth (GEBAUERS) aerosol 1 application  1  application Topical PRN Alvin C Powell, MD      . sodium chloride 0.9 % injection 3 mL  3 mL Intravenous Q12H [name] K Brown, MD   3 mL at 07/01/13 1212  . Warfarin - Pharmacist Dosing Inpatient   Does not apply q1800 Emily B Mullen, MD        ROS:   General:  No weight loss, Fever, chills  HEENT: No recent headaches, no nasal bleeding, no visual changes, no sore throat  Neurologic: No dizziness, blackouts, seizures. No recent symptoms of stroke or mini- stroke. No recent episodes of slurred speech, or temporary blindness.  Cardiac: No recent episodes of chest pain/pressure, no shortness of breath at rest.  + shortness of breath with exertion.  Denies history of atrial fibrillation or irregular heartbeat  Vascular: No history of rest pain in feet.  No history of claudication.  No history of non-healing ulcer, No history of DVT   Pulmonary: No home oxygen, no productive cough, no hemoptysis,  No asthma or wheezing  Musculoskeletal:  [x ] Arthritis, [ ] Low back pain,  [ ] Joint pain  Hematologic:No history of hypercoagulable state.  No history of easy bleeding.  No history of anemia  Gastrointestinal: No hematochezia or melena,  No gastroesophageal reflux, + trouble swallowing dysmotility  Urinary: [x ] chronic Kidney disease, [ ] on HD - [ ] MWF or [x ] TTHS, [ ] Burning with urination, [ ] Frequent urination, [ ] Difficulty urinating;   Skin: No rashes  Psychological: No history of anxiety,  + history of depression   Physical Examination  Filed Vitals:   06/30/13 2152 07/01/13 0535 07/01/13 1000 07/01/13 1323  BP: 101/62 87/60 104/75 105/74  Pulse: 128 77 106 119  Temp: 97.6 F (36.4 C) 97.9 F (36.6 C) 98.9 F (37.2 C) 98 F (36.7 C)  TempSrc: Oral Oral Oral Oral  Resp: 20 20 22 20  Height: 6' 1" (1.854 m)     Weight: 186 lb 8.2 oz (84.602 kg)     SpO2: 98% 95% 95% 93%    Body mass index is 24.61 kg/(m^2).  General:  Alert and oriented, no acute distress HEENT:  decreased hearing acuity   Neck: No bruit or JVD Pulmonary: Clear to auscultation bilaterally Cardiac: Regular Rate and Rhythm Abdomen: Soft, non-tender Skin: No rash Extremity Pulses:  2+ radial, brachial right side, 2+ left brachial absent left radial pulse Musculoskeletal: No deformity or edema  Neurologic: Upper and lower extremity motor 5/5 and symmetric  DATA: Vein map marginal vein <3 mm bilat   ASSESSMENT: Needs long term hemodialysis access   PLAN:  Left arm AV graft Monday Sept 1.  Procedure risk benefit details discussed.  Pt would prefer MAC anesthesia. Stop coumadin today.  Gwenetta Devos, MD [facility] [place] Office: [phone] Pager: [phone]  

## 2013-07-08 NOTE — Anesthesia Postprocedure Evaluation (Signed)
  Anesthesia Post-op Note  Patient: [name]  Procedure(s) Performed: Procedure(s): INSERTION OF ARTERIOVENOUS (AV) GORE-TEX GRAFT ARM (Left)  Patient Location: PACU  Anesthesia Type:MAC  Level of Consciousness: awake, alert  and oriented  Airway and Oxygen Therapy: Patient Spontanous Breathing  Post-op Pain: none  Post-op Assessment: Post-op Vital signs reviewed, Patient's Cardiovascular Status Stable, Respiratory Function Stable, Patent Airway and No signs of Nausea or vomiting  Post-op Vital Signs: Reviewed and stable  Complications: No apparent anesthesia complications

## 2013-07-08 NOTE — Progress Notes (Addendum)
[name] came to the hospital with indwelling heparin in his HD catheter.  His catheter was used for venous access during his fistula placement by anesthesia.  [name] has an allergy to Heparin documented on his chart (HIT + and SRA neg.).  [facility] has been using indwelling heparin in his catheter but not using heparin during his treatments.  Dr. Hyman Hopes was notified of situation. He called the HD center in [place].  He called me back to state patient could have indwelling heparin used in his catheter.  VAS team called and updated. Noted entered by Norva Riffle RN

## 2013-07-08 NOTE — Transfer of Care (Signed)
Immediate Anesthesia Transfer of Care Note  Patient: [name]  Procedure(s) Performed: Procedure(s): INSERTION OF ARTERIOVENOUS (AV) GORE-TEX GRAFT ARM (Left)  Patient Location: PACU  Anesthesia Type:MAC  Level of Consciousness: awake, alert  and oriented  Airway & Oxygen Therapy: Patient Spontanous Breathing  Post-op Assessment: Report given to PACU RN and Post -op Vital signs reviewed and stable  Post vital signs: Reviewed and stable  Complications: No apparent anesthesia complications

## 2013-07-08 NOTE — Op Note (Signed)
Procedure: Left forearm AV graft  Preoperative diagnosis: End-stage renal disease  Postoperative diagnosis: Same  Anesthesia: Local with MAC  Assistant: Della Goo PA-C  Operative findings: 4-7 mm tapered PTFE graft  Operative details: After obtaining informed consent, the patient was taken to the operating room. The patient was placed in supine position on the operating room table. After adequate sedation, the patient's entire left upper extremity was prepped and draped in usual sterile fashion. After infiltrating local anesthesia near the antecubital crease, a longitudinal incision was made near the left antecubital crease. Incision was carried down through the subcutaneous tissues down to the level of the left brachial artery.  Next the brachial artery was dissected free in the medial portion incision. The brachial artery was approximately 4 mm in diameter and soft in character. Adjacent deep brachial vein was also approximately 3 mm in diameter. This was dissected free circumferentially to the selected as the outflow for the graft. Next a transverse incision was made in the distal forearm for assistance in tunneling. A subcutaneous tunnel was then created in a loop configuration down the forearm and a 4-7 mm tapered PTFE graft was brought through this subcutaneous tunnel with the arterial limb on the radial aspect of the forearm. The patient was given 5000 units of intravenous heparin. After approximately 2 minutes of circulation time, Vesseloops were used to control the brachial artery proximally and distally. A longitudinal opening was made in the brachial artery the 4 mm end of the graft was slightly beveled and sewn end of graft to side of artery using a running 6-0 Prolene suture. Prior to completion of the anastomosis the artery was forward bled back bled and thoroughly flushed. The anastomosis was secured, vessel loops released, and there was pulsatile flow in the graft immediately.  Attention was then turned to the venous limb of the graft. Small bulldog clamps were used to control the outflow vein proximally and distally. A longitudinal opening was made in the deep brachial vein the graft was cut to length and spatulated and sewn end of graft to side of vein using a running 6-0 Prolene suture. Just prior to completion of anastomosis it was forward bled back bled and thoroughly flushed. The anastomosis was secured, Vesseloops were released, and  there was a palpable thrill above the graft immediately. Hemostasis was obtained with direct pressure and the assistance of 50 mg of protamine. Next subcutaneous tissues of both incisions reapproximated using a running 3-0 Vicryl suture. The skin of both incisions was closed with 4-0 Vicryl subcuticular stitch. Dermabond was applied to both incisions.  The patient tolerated the procedure well and there were no complications. Instrument sponge and needle counts were correct at the end of the case. The patient was taken to the recovery room in stable condition. The patient had no palpable radial pulse preop and no radial doppler post op.  There was a good ulnar doppler signal.  Fabienne Bruns, MD [facility] [place] Office: [phone] Pager: [phone]

## 2013-07-08 NOTE — Anesthesia Preprocedure Evaluation (Addendum)
Anesthesia Evaluation  Patient identified by MRN, date of birth, ID band  Reviewed: Allergy & Precautions, H&P , NPO status , Patient's Chart, lab work & pertinent test results  Airway Mallampati: I      Dental  (+) Edentulous Upper and Edentulous Lower   Pulmonary          Cardiovascular hypertension, + CAD, + Past MI, + CABG, + Peripheral Vascular Disease and +CHF + dysrhythmias Atrial Fibrillation     Neuro/Psych    GI/Hepatic   Endo/Other  Hypothyroidism   Renal/GU ESRFRenal disease     Musculoskeletal  (+) Arthritis -,   Abdominal   Peds  Hematology   Anesthesia Other Findings   Reproductive/Obstetrics                          Anesthesia Physical Anesthesia Plan  ASA: III  Anesthesia Plan: MAC   Post-op Pain Management:    Induction: Intravenous  Airway Management Planned:   Additional Equipment:   Intra-op Plan:   Post-operative Plan:   Informed Consent: I have reviewed the patients History and Physical, chart, labs and discussed the procedure including the risks, benefits and alternatives for the proposed anesthesia with the patient or authorized representative who has indicated his/her understanding and acceptance.     Plan Discussed with:   Anesthesia Plan Comments:         Anesthesia Quick Evaluation

## 2013-07-09 ENCOUNTER — Telehealth: Payer: Self-pay | Admitting: Vascular Surgery

## 2013-07-09 ENCOUNTER — Encounter (HOSPITAL_COMMUNITY): Payer: Self-pay | Admitting: Vascular Surgery

## 2013-07-09 NOTE — Telephone Encounter (Signed)
Message copied by Margaretmary Eddy on Thu Jul 09, 2013  1:35 PM ------      Message from: Melene Plan      Created: Thu Jul 09, 2013 12:53 PM      Regarding: RE: hospital followup       It was done by Dr CEF and there is no follow up with AVG unless a problem so it can be cancelled/jjk      ----- Message -----         From: Margaretmary Eddy         Sent: 07/09/2013  10:13 AM           To: Melene Plan, RN      Subject: hospital followup                                        Patient had his access put in yesterday by Dr. Edilia Bo. Has appt to eval for access on 9/24. Before I cancel that, will there be a post surgical follow up for this patient? Thank you!       ------

## 2013-07-09 NOTE — Telephone Encounter (Signed)
Pt's wife aware of appointment being cancelled for 9/24, pt already had access placed by CEF, no fu necessary per staff message 9/4 - kf

## 2013-07-14 NOTE — Discharge Summary (Signed)
I saw [name] on day of discharge and agree with formulated plan.

## 2013-07-15 ENCOUNTER — Telehealth: Payer: Self-pay | Admitting: *Deleted

## 2013-07-15 ENCOUNTER — Ambulatory Visit (INDEPENDENT_AMBULATORY_CARE_PROVIDER_SITE_OTHER): Payer: Medicare Other | Admitting: *Deleted

## 2013-07-15 ENCOUNTER — Encounter (HOSPITAL_COMMUNITY): Payer: Self-pay

## 2013-07-15 ENCOUNTER — Emergency Department (HOSPITAL_COMMUNITY): Payer: Medicare Other

## 2013-07-15 ENCOUNTER — Emergency Department (HOSPITAL_COMMUNITY)
Admission: EM | Admit: 2013-07-15 | Discharge: 2013-07-15 | Discharge status: Against Medical Advice | Payer: Medicare Other | Attending: Emergency Medicine | Admitting: Emergency Medicine

## 2013-07-15 DIAGNOSIS — I951 Orthostatic hypotension: Secondary | ICD-10-CM | POA: Insufficient documentation

## 2013-07-15 DIAGNOSIS — Z951 Presence of aortocoronary bypass graft: Secondary | ICD-10-CM

## 2013-07-15 DIAGNOSIS — E039 Hypothyroidism, unspecified: Secondary | ICD-10-CM | POA: Insufficient documentation

## 2013-07-15 DIAGNOSIS — R42 Dizziness and giddiness: Secondary | ICD-10-CM | POA: Insufficient documentation

## 2013-07-15 DIAGNOSIS — N186 End stage renal disease: Secondary | ICD-10-CM | POA: Insufficient documentation

## 2013-07-15 DIAGNOSIS — I480 Paroxysmal atrial fibrillation: Secondary | ICD-10-CM

## 2013-07-15 DIAGNOSIS — Z87312 Personal history of (healed) stress fracture: Secondary | ICD-10-CM | POA: Insufficient documentation

## 2013-07-15 DIAGNOSIS — M069 Rheumatoid arthritis, unspecified: Secondary | ICD-10-CM | POA: Insufficient documentation

## 2013-07-15 DIAGNOSIS — I120 Hypertensive chronic kidney disease with stage 5 chronic kidney disease or end stage renal disease: Secondary | ICD-10-CM | POA: Insufficient documentation

## 2013-07-15 DIAGNOSIS — K219 Gastro-esophageal reflux disease without esophagitis: Secondary | ICD-10-CM | POA: Insufficient documentation

## 2013-07-15 DIAGNOSIS — I252 Old myocardial infarction: Secondary | ICD-10-CM | POA: Insufficient documentation

## 2013-07-15 DIAGNOSIS — Z8659 Personal history of other mental and behavioral disorders: Secondary | ICD-10-CM | POA: Insufficient documentation

## 2013-07-15 DIAGNOSIS — I5022 Chronic systolic (congestive) heart failure: Secondary | ICD-10-CM | POA: Insufficient documentation

## 2013-07-15 DIAGNOSIS — Z8673 Personal history of transient ischemic attack (TIA), and cerebral infarction without residual deficits: Secondary | ICD-10-CM | POA: Insufficient documentation

## 2013-07-15 DIAGNOSIS — I4891 Unspecified atrial fibrillation: Secondary | ICD-10-CM

## 2013-07-15 DIAGNOSIS — Z86718 Personal history of other venous thrombosis and embolism: Secondary | ICD-10-CM | POA: Insufficient documentation

## 2013-07-15 DIAGNOSIS — Z79899 Other long term (current) drug therapy: Secondary | ICD-10-CM | POA: Insufficient documentation

## 2013-07-15 DIAGNOSIS — Z7901 Long term (current) use of anticoagulants: Secondary | ICD-10-CM

## 2013-07-15 DIAGNOSIS — I4892 Unspecified atrial flutter: Secondary | ICD-10-CM | POA: Insufficient documentation

## 2013-07-15 DIAGNOSIS — I2510 Atherosclerotic heart disease of native coronary artery without angina pectoris: Secondary | ICD-10-CM | POA: Insufficient documentation

## 2013-07-15 DIAGNOSIS — E785 Hyperlipidemia, unspecified: Secondary | ICD-10-CM | POA: Insufficient documentation

## 2013-07-15 DIAGNOSIS — Z992 Dependence on renal dialysis: Secondary | ICD-10-CM | POA: Insufficient documentation

## 2013-07-15 DIAGNOSIS — Z87891 Personal history of nicotine dependence: Secondary | ICD-10-CM | POA: Insufficient documentation

## 2013-07-15 DIAGNOSIS — H9190 Unspecified hearing loss, unspecified ear: Secondary | ICD-10-CM | POA: Insufficient documentation

## 2013-07-15 DIAGNOSIS — Z8546 Personal history of malignant neoplasm of prostate: Secondary | ICD-10-CM | POA: Insufficient documentation

## 2013-07-15 DIAGNOSIS — R110 Nausea: Secondary | ICD-10-CM | POA: Insufficient documentation

## 2013-07-15 LAB — COMPREHENSIVE METABOLIC PANEL
ALT: 14 U/L (ref 0–53)
AST: 27 U/L (ref 0–37)
Albumin: 2.4 g/dL — ABNORMAL LOW (ref 3.5–5.2)
Alkaline Phosphatase: 100 U/L (ref 39–117)
Calcium: 8.8 mg/dL (ref 8.4–10.5)
GFR calc Af Amer: 15 mL/min — ABNORMAL LOW (ref >90)
Potassium: 4.5 mEq/L (ref 3.5–5.1)
Sodium: 137 mEq/L (ref 135–145)
Total Protein: 6.9 g/dL (ref 6.0–8.3)

## 2013-07-15 LAB — POCT INR: INR: 1.4

## 2013-07-15 MED ORDER — SODIUM CHLORIDE 0.9 % IV BOLUS (SEPSIS): 250.0000 mL | Freq: Once | INTRAVENOUS | Status: DC

## 2013-07-15 NOTE — ED Notes (Signed)
Pt reports bp was low today at home, unsure of numbers. Pt reports feeling weak and having nausea. That started this am.  Fever, no diarrhea, normal po intake. Dizzy at times, no cp.

## 2013-07-15 NOTE — Telephone Encounter (Signed)
Home health nurse informed. Made attempt to call patient and was unable to reach patient by phone.

## 2013-07-15 NOTE — ED Notes (Signed)
Pt refuses for nurse to try for an IV site.  Pt states " i get better care at [facility]".  Pt refused to stay for treatment.  Notified edp

## 2013-07-15 NOTE — ED Provider Notes (Signed)
CSN: [id number]     Arrival date & time 07/15/13  1009 History  This chart was scribed for Ward Givens, MD by Quintella Reichert, ED scribe.  This patient was seen in room APA02/APA02 and the patient's care was started at 11:08 AM.   Chief Complaint  Patient presents with  . Hypotension    The history is provided by the patient. No language interpreter was used.    HPI Comments: [name] is a 79 y.o. male with h/o HTN, DM, CHF, and MI who presents to the Emergency Department complaining of low BP that began this morning.  Pt states he felt fine when he woke up this morning and was walking around and felt fine,  but his home health nurse checked his BP 3 hours ago while sitting and standing and found a systolic BP of 74 on standing.  He also began to feel nauseous and lightheaded when he stood up.  Symptoms have been gradually improving since onset and on arrival BP is 102/55.  He notes that he was able to walk without lightheadedness this morning prior to onset.  He took his heart medications immediately before having his BP checked.  He denies CP, SOB, diaphoresis, vomiting, melena, blood in stools, or diarrhea.  He states he has been eating normally.  He has been moving his bowels regularly.  Pt was prescribed a large number of new medications while hospitalized recently for a MI.  He received a bypass graft placed on 05/13/13.  Pt is a [place] dialysis patient and had no issues at his dialysis appointment yesterday which was Tuesday.  He notes some h/o feeling nauseous the day after dialysis.  His dry weight is 195 lb.  Pt has h/o DVT in his left leg but denies h/o PE.  He denies h/o anemia to his knowledge.  He is on coumadin.  PCP is Dr. Sherril Croon [facility], has appt on 8/26 Nephrology Dr Kristian Covey   Past Medical History  Diagnosis Date  . Hypertension   . Depression   . 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 Coronary artery disease     a. 05/2013 NSTEMI/Cath:  LM 90d, LAD 90ost/56m, LCX 90ost, OM1 90p, RCA 100p;  b. 05/2013 CABG x 4: LIMA->LAD, VG->RI->OM, VG->PDA.  [name] ESRD on dialysis 06/2013    TTS HD in [place]  . Peripheral arterial disease   . Atrial flutter     a. Dx 05/2013-->dilt/coumadin.  [name] DVT (deep venous thrombosis) 02/2011    LLL  . GERD (gastroesophageal reflux disease)   . Compression fracture of cervical spine   . Rheumatoid arthritis   . Prostate cancer   . Stasis dermatitis     LLL, wears TED hose  . AAA (abdominal aortic aneurysm)   . 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 Chronic systolic CHF (congestive heart failure)     EF 45-55% per echo 05/28/13  . Hyperlipidemia   . Dysphagia     a. after prolonged intubation 05/2013; b. 06/2013 refused EDG/Dilatation.  . Myocardial infarction   . Hypothyroidism   . Stroke     denies  . Constipation     Past Surgical History  Procedure Laterality Date  . Coronary artery bypass graft N/A 05/13/2013    Procedure: CORONARY ARTERY BYPASS GRAFTING (CABG);  Surgeon: Alleen Borne, MD;  Location: [facility] OR;  Service: Open Heart Surgery;  Laterality: N/A;  . Insertion of dialysis  catheter N/A 05/26/2013    Procedure: INSERTION OF DIALYSIS CATHETER;  Surgeon: Sherren Kerns, MD;  Location: [facility] OR;  Service: Vascular;  Laterality: N/A;  right IJ  . Cardiac surgery    . Cataract extraction    . Eye surgery Left     cataract  . Av fistula placement Left 07/08/2013    Procedure: INSERTION OF ARTERIOVENOUS (AV) GORE-TEX GRAFT ARM;  Surgeon: Sherren Kerns, MD;  Location: [facility] OR;  Service: Vascular;  Laterality: Left;    Family History  Problem Relation Age of Onset  . Heart attack Mother     deceased @ 29  . Kidney failure Father     deceased @ 77  . Heart attack Son     deceased @ 31    History  Substance Use Topics  . Smoking status: Former Smoker -- 1.00 packs/day for 47 years    Quit date: 11/05/2000  . Smokeless tobacco: Never Used  . Alcohol Use: No     Comment: Previously  drank 6 beers/day, quit in 1994  lives at home  Lives with spouse  Review of Systems  Constitutional: Negative for diaphoresis.  Respiratory: Negative for shortness of breath.   Cardiovascular: Negative for chest pain.  Gastrointestinal: Positive for nausea. Negative for vomiting, diarrhea and blood in stool.  Neurological: Positive for light-headedness.  All other systems reviewed and are negative.     Allergies  Heparin  Home Medications   Current Outpatient Rx  Name  Route  Sig  Dispense  Refill  . acetaminophen (TYLENOL) 325 MG tablet   Oral   Take 1-2 tablets (325-650 mg total) by mouth every 6 (six) hours as needed for pain.         [name] amiodarone (PACERONE) 400 MG tablet   Oral   Take 1 tablet (400 mg total) by mouth 2 (two) times daily.   60 tablet   0   . atorvastatin (LIPITOR) 20 MG tablet   Oral   Take 1 tablet (20 mg total) by mouth daily at 6 PM.   30 tablet   0   . calcium acetate (PHOSLO) 667 MG capsule   Oral   Take 1 capsule (667 mg total) by mouth 3 (three) times daily with meals.   90 capsule   0   . diltiazem (CARDIZEM CD) 180 MG 24 hr capsule   Oral   Take 1 capsule (180 mg total) by mouth daily.   30 capsule   0   . docusate sodium 100 MG CAPS   Oral   Take 100 mg by mouth daily.   10 capsule   0   . feeding supplement (RESOURCE BREEZE) LIQD   Oral   Take 1 Container by mouth 3 (three) times daily between meals.         . hydrOXYzine (ATARAX/VISTARIL) 25 MG tablet   Oral   Take 1 tablet (25 mg total) by mouth 3 (three) times daily as needed for itching.   90 tablet   0   . levothyroxine (SYNTHROID, LEVOTHROID) 100 MCG tablet   Oral   Take 1 tablet (100 mcg total) by mouth daily before breakfast.   30 tablet   0   . meclizine (ANTIVERT) 25 MG tablet   Oral   Take 25 mg by mouth 3 (three) times daily.         . multivitamin (RENA-VIT) TABS tablet   Oral   Take 1 tablet by mouth daily.         [name]  ondansetron  (ZOFRAN) 4 MG tablet   Oral   Take 1 tablet (4 mg total) by mouth every 8 (eight) hours as needed for nausea.   20 tablet   0   . oxyCODONE (ROXICODONE) 5 MG immediate release tablet   Oral   Take 1-2 tablets (5-10 mg total) by mouth every 4 (four) hours as needed for pain.   30 tablet   0   . pantoprazole (PROTONIX) 40 MG tablet   Oral   Take 1 tablet (40 mg total) by mouth daily.   30 tablet   0    BP 102/55  Pulse 108  Temp(Src) 97.5 F (36.4 C) (Oral)  Resp 20  Ht 6\' 1"  (1.854 m)  Wt 195 lb (88.451 kg)  BMI 25.73 kg/m2  SpO2 98%  Vital signs normal except tachycardia  Orthostatic vs shows Heart rate increased from 105-115, BP dropped from 198 systolic to 93 systolic  Physical Exam  Nursing note and vitals reviewed. Constitutional: He is oriented to person, place, and time. He appears well-developed and well-nourished. No distress.  HENT:  Head: Normocephalic and atraumatic.  Right Ear: External ear normal.  Left Ear: External ear normal.  Nose: Nose normal. No mucosal edema or rhinorrhea.  Mouth/Throat: Oropharynx is clear and moist and mucous membranes are normal. No dental abscesses or edematous.  Eyes: Conjunctivae and EOM are normal. Pupils are equal, round, and reactive to light.  Neck: Normal range of motion and full passive range of motion without pain. Neck supple.  Cardiovascular: Normal rate, regular rhythm and normal heart sounds.  Exam reveals no gallop and no friction rub.   No murmur heard. Pulmonary/Chest: Effort normal and breath sounds normal. No respiratory distress. He has no wheezes. He has no rhonchi. He has no rales. He exhibits no tenderness and no crepitus.  Abdominal: Soft. Normal appearance and bowel sounds are normal. He exhibits no distension. There is no tenderness. There is no rebound and no guarding.  Musculoskeletal: Normal range of motion. He exhibits no edema and no tenderness.  Moves all extremities well.  No leg swelling.   Neurological: He is alert and oriented to person, place, and time. He has normal strength. No cranial nerve deficit.  Skin: Skin is warm, dry and intact. No rash noted. No erythema. There is pallor.  Psychiatric: He has a normal mood and affect. His speech is normal and behavior is normal. His mood appears not anxious.    ED Course  Procedures (including critical care time)  DIAGNOSTIC STUDIES: Oxygen Saturation is 98% on room air, normal by my interpretation.    COORDINATION OF CARE: 11:23 AM-Discussed treatment plan which includes IV fluids and bloodwork with pt at bedside and pt agreed to plan.   Nursing having difficulty getting IV, pt then refused to have any blood work drawn or IV placed. Pt signed out AMA   Date: 07/15/2013  Rate: 106  Rhythm: sinus tachycardia  QRS Axis: normal  Intervals: normal  ST/T Wave abnormalities: normal  Conduction Disutrbances:nonspecific intraventricular conduction delay  Narrative Interpretation:   Old EKG Reviewed: unchanged from 9/3/ 2014    MDM   1. Orthostatic hypotension     Pt left AMA   I personally performed the services described in this documentation, which was scribed in my presence. The recorded information has been reviewed and considered.  Devoria Albe, MD, [facility]    Ward Givens, MD 07/15/13 [phone]

## 2013-07-15 NOTE — Telephone Encounter (Signed)
Home health nurse with [facility] called to report status of patient. Nurse reports patient having low blood pressure readings and also  c/o dizziness. No chest pain or sob. Please advise.

## 2013-07-15 NOTE — Telephone Encounter (Signed)
The patient's symptoms of dizziness and hypotension should be further evaluated in the emergency department. He has a complex history of CAD and paroxysmal afib on multiple medications, he should be further evaluated.

## 2013-07-17 ENCOUNTER — Ambulatory Visit (INDEPENDENT_AMBULATORY_CARE_PROVIDER_SITE_OTHER): Payer: Medicare Other | Admitting: *Deleted

## 2013-07-17 DIAGNOSIS — I4891 Unspecified atrial fibrillation: Secondary | ICD-10-CM

## 2013-07-17 DIAGNOSIS — Z7901 Long term (current) use of anticoagulants: Secondary | ICD-10-CM

## 2013-07-17 DIAGNOSIS — Z951 Presence of aortocoronary bypass graft: Secondary | ICD-10-CM

## 2013-07-17 DIAGNOSIS — I480 Paroxysmal atrial fibrillation: Secondary | ICD-10-CM

## 2013-07-20 ENCOUNTER — Ambulatory Visit (INDEPENDENT_AMBULATORY_CARE_PROVIDER_SITE_OTHER): Payer: Medicare Other | Admitting: *Deleted

## 2013-07-20 DIAGNOSIS — I4891 Unspecified atrial fibrillation: Secondary | ICD-10-CM

## 2013-07-20 DIAGNOSIS — I480 Paroxysmal atrial fibrillation: Secondary | ICD-10-CM

## 2013-07-20 DIAGNOSIS — Z951 Presence of aortocoronary bypass graft: Secondary | ICD-10-CM

## 2013-07-20 DIAGNOSIS — Z7901 Long term (current) use of anticoagulants: Secondary | ICD-10-CM

## 2013-07-20 LAB — POCT INR: INR: 1.4

## 2013-07-23 ENCOUNTER — Ambulatory Visit: Payer: Medicare Other | Admitting: *Deleted

## 2013-07-23 DIAGNOSIS — Z7901 Long term (current) use of anticoagulants: Secondary | ICD-10-CM

## 2013-07-23 DIAGNOSIS — I480 Paroxysmal atrial fibrillation: Secondary | ICD-10-CM

## 2013-07-23 DIAGNOSIS — Z951 Presence of aortocoronary bypass graft: Secondary | ICD-10-CM

## 2013-07-27 ENCOUNTER — Ambulatory Visit: Payer: Medicare Other | Admitting: *Deleted

## 2013-07-27 DIAGNOSIS — Z951 Presence of aortocoronary bypass graft: Secondary | ICD-10-CM

## 2013-07-27 DIAGNOSIS — Z7901 Long term (current) use of anticoagulants: Secondary | ICD-10-CM

## 2013-07-27 DIAGNOSIS — I480 Paroxysmal atrial fibrillation: Secondary | ICD-10-CM

## 2013-07-29 ENCOUNTER — Encounter: Payer: Medicare Other | Admitting: Cardiology

## 2013-07-29 ENCOUNTER — Ambulatory Visit: Payer: Medicare Other | Admitting: Vascular Surgery

## 2013-07-31 ENCOUNTER — Ambulatory Visit (INDEPENDENT_AMBULATORY_CARE_PROVIDER_SITE_OTHER): Payer: Medicare Other | Admitting: *Deleted

## 2013-07-31 ENCOUNTER — Encounter: Payer: Self-pay | Admitting: Cardiology

## 2013-07-31 ENCOUNTER — Ambulatory Visit (INDEPENDENT_AMBULATORY_CARE_PROVIDER_SITE_OTHER): Payer: Medicare Other | Admitting: Cardiology

## 2013-07-31 VITALS — BP 95/62 | HR 95 | Ht 73.0 in | Wt 195.0 lb

## 2013-07-31 DIAGNOSIS — Z951 Presence of aortocoronary bypass graft: Secondary | ICD-10-CM

## 2013-07-31 DIAGNOSIS — Z7901 Long term (current) use of anticoagulants: Secondary | ICD-10-CM

## 2013-07-31 DIAGNOSIS — I4891 Unspecified atrial fibrillation: Secondary | ICD-10-CM

## 2013-07-31 DIAGNOSIS — I10 Essential (primary) hypertension: Secondary | ICD-10-CM

## 2013-07-31 DIAGNOSIS — I25810 Atherosclerosis of coronary artery bypass graft(s) without angina pectoris: Secondary | ICD-10-CM

## 2013-07-31 DIAGNOSIS — I480 Paroxysmal atrial fibrillation: Secondary | ICD-10-CM

## 2013-07-31 MED ORDER — AMIODARONE HCL 200 MG PO TABS: 200.0000 mg | ORAL_TABLET | Freq: Every day | ORAL | Status: DC

## 2013-07-31 MED ORDER — WARFARIN SODIUM 2.5 MG PO TABS: 1.2500 mg | ORAL_TABLET | Freq: Every day | ORAL | Status: DC

## 2013-07-31 MED ORDER — DILTIAZEM HCL 60 MG PO TABS: 60.0000 mg | ORAL_TABLET | Freq: Two times a day (BID) | ORAL | Status: DC

## 2013-07-31 NOTE — Progress Notes (Signed)
Clinical Summary Mr. Peak is a 79 y.o.male seen today as a new patient, he was seen for the following problems.   1. CAD - recent CABG 05/13/13 4 vessel: LIMA to LAD, SVG to OM, SVG to PDA, SVG to ramus. Presented w/ NSTEMI.    - denies any chest pain. Some SOB w/ exertion at approx 1 block. No orthopnea, no PND. Denies any pain along incision site.  - compliant w/ meds  2. HTN - ER visit 07/15/13 w/ low blood pressure with reported systolic blood pressure of 74 at home. + lightheadedness + nausea. Seemed to occur shortly after taking his medicines. No CP, SOB, or palpitations. Blood pressure in ER was 102/55.  - pt left ER AMA during workup. Trop neg x 1,   - episode of low bp during dialysis. Also has orthostatic dizziness  3. Afib - denies any recent palpitations - on coumadin, checked here. - no troubles w/ bleeding - seems to still be taking amiodarone 400 mg bid since hospital discharge.    Past Medical History  Diagnosis Date  . Hypertension   . Depression   . 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 Coronary artery disease     a. 05/2013 NSTEMI/Cath: LM 90d, LAD 90ost/46m, LCX 90ost, OM1 90p, RCA 100p;  b. 05/2013 CABG x 4: LIMA->LAD, VG->RI->OM, VG->PDA.  [name] ESRD on dialysis 06/2013    TTS HD in [place]  . Peripheral arterial disease   . Atrial flutter     a. Dx 05/2013-->dilt/coumadin.  [name] DVT (deep venous thrombosis) 02/2011    LLL  . GERD (gastroesophageal reflux disease)   . Compression fracture of cervical spine   . Rheumatoid arthritis   . Prostate cancer   . Stasis dermatitis     LLL, wears TED hose  . AAA (abdominal aortic aneurysm)   . 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 Chronic systolic CHF (congestive heart failure)     EF 45-55% per echo 05/28/13  . Hyperlipidemia   . Dysphagia     a. after prolonged intubation 05/2013; b. 06/2013 refused EDG/Dilatation.  . Myocardial infarction   . Hypothyroidism   . Stroke     denies  . Constipation       Allergies  Allergen Reactions  . 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 acetaminophen (TYLENOL) 325 MG tablet Take 1-2 tablets (325-650 mg total) by mouth every 6 (six) hours as needed for pain.      [name] amiodarone (PACERONE) 200 MG tablet Take 400 mg by mouth 2 (two) times daily.      [name] atorvastatin (LIPITOR) 20 MG tablet Take 1 tablet (20 mg total) by mouth daily at 6 PM.  30 tablet  0  . diltiazem (CARDIZEM CD) 180 MG 24 hr capsule Take 1 capsule (180 mg total) by mouth daily.  30 capsule  0  . docusate sodium 100 MG CAPS Take 100 mg by mouth daily.  10 capsule  0  . hydrOXYzine (ATARAX/VISTARIL) 25 MG tablet Take 1 tablet (25 mg total) by mouth 3 (three) times daily as needed for itching.  90 tablet  0  . levothyroxine (SYNTHROID, LEVOTHROID) 100 MCG tablet Take 1 tablet (100 mcg total) by mouth daily before breakfast.  30 tablet  0  . ondansetron (ZOFRAN) 8 MG tablet Take 4 mg by  mouth every 8 (eight) hours as needed for nausea.      . pantoprazole (PROTONIX) 40 MG tablet Take 40 mg by mouth 2 (two) times daily.      [name] warfarin (COUMADIN) 2.5 MG tablet Take 1.25 mg by mouth daily. Takes 1.25 Tues, Thurs, and Sat.      . zolpidem (AMBIEN) 5 MG tablet Take 5 mg by mouth at bedtime as needed for sleep.       No current facility-administered medications for this visit.     Past Surgical History  Procedure Laterality Date  . Coronary artery bypass graft N/A 05/13/2013    Procedure: CORONARY ARTERY BYPASS GRAFTING (CABG);  Surgeon: Alleen Borne, MD;  Location: [facility] OR;  Service: Open Heart Surgery;  Laterality: N/A;  . Insertion of dialysis catheter N/A 05/26/2013    Procedure: INSERTION OF DIALYSIS CATHETER;  Surgeon: Sherren Kerns, MD;  Location: [facility] OR;  Service: Vascular;  Laterality: N/A;  right IJ  . Cardiac surgery    . Cataract extraction    . Eye surgery Left     cataract  . Av fistula placement Left  07/08/2013    Procedure: INSERTION OF ARTERIOVENOUS (AV) GORE-TEX GRAFT ARM;  Surgeon: Sherren Kerns, MD;  Location: [facility] OR;  Service: Vascular;  Laterality: Left;     Allergies  Allergen Reactions  . 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 Heart attack Mother     deceased @ 12  . Kidney failure Father     deceased @ 67  . Heart attack Son     deceased @ 61     Social History Mr. Freimark reports that he quit smoking about 12 years ago. He has never used smokeless tobacco. Mr. Ploeger reports that he does not drink alcohol.   Review of Systems Complete ROS negative other than reported in HPI  Physical Examination p 95 bp 95/62 Wt 195 lbs BMI 25 Gen: resting comfortably, NAD HEENT: no scleral icterus, pupils equal round and reactive, no palptable cervical adenopathy CV: RRR, no m/r/g, no JVD, no carotid bruits Pulm: CTAB Abd: soft, NT, ND NABS, no hepatosplenomegaly Ext: warm, no edema.  MSK: warm, no rash Neuro: A&Ox3, no focal deficits    Diagnostic Studies 05/2013 Echo - LVEF 45-50%, hypokinetic septal and mid and basal inferior walls. Mild LAE.    Assessment and Plan  1. CAD - no current symptoms - will add ASA for secondary prevention. Low blood pressure with orthostatic symptoms, cannot tolerate beta blocker or ACE-I  2. HTN - recent problems with hypotension - will decrease diltiazem to 60mg  bid short acting, instructed if having symptoms can hold the night time dose. Was on dilt at discharge, may consider switching to beta blocker as he follows with me in clinic.  3. Afib - denies any current symptoms - decrease amiodarone to maintence dose of 200mg  daily - needs repeat TSH at next visit, was elevated previously in hosptial, trending back to normal at last check in August    Antoine Poche, M.D., F.A.C.C.

## 2013-07-31 NOTE — Patient Instructions (Signed)
Your physician recommends that you schedule a follow-up appointment in: 3 weeks with Dr. Wyline Mood. This appointment will be scheduled today before you leave.  Your physician has recommended you make the following change in your medication:  Decrease Amiodarone 200 MG once daily. Decrease Diltiazem 60 Mg twice daily. A prescription has been sent to your pharmacy. Start Aspirin 81 MG once daily  Continue all other medications the same.

## 2013-08-13 ENCOUNTER — Other Ambulatory Visit: Payer: Self-pay

## 2013-08-13 ENCOUNTER — Other Ambulatory Visit (HOSPITAL_COMMUNITY): Payer: Self-pay | Admitting: Nephrology

## 2013-08-13 DIAGNOSIS — N186 End stage renal disease: Secondary | ICD-10-CM

## 2013-08-14 ENCOUNTER — Ambulatory Visit (INDEPENDENT_AMBULATORY_CARE_PROVIDER_SITE_OTHER): Payer: Medicare Other | Admitting: *Deleted

## 2013-08-14 DIAGNOSIS — Z951 Presence of aortocoronary bypass graft: Secondary | ICD-10-CM

## 2013-08-14 DIAGNOSIS — Z7901 Long term (current) use of anticoagulants: Secondary | ICD-10-CM

## 2013-08-14 DIAGNOSIS — I480 Paroxysmal atrial fibrillation: Secondary | ICD-10-CM

## 2013-08-14 DIAGNOSIS — I4891 Unspecified atrial fibrillation: Secondary | ICD-10-CM

## 2013-08-14 LAB — POCT INR: INR: 1.5

## 2013-08-19 ENCOUNTER — Ambulatory Visit (HOSPITAL_COMMUNITY)
Admission: RE | Admit: 2013-08-19 | Discharge: 2013-08-19 | Disposition: A | Discharge status: Home / Self Care | Payer: Medicare Other | Source: Ambulatory Visit | Attending: Vascular Surgery | Admitting: Vascular Surgery

## 2013-08-19 ENCOUNTER — Ambulatory Visit: Payer: Self-pay | Admitting: Cardiology

## 2013-08-19 DIAGNOSIS — N186 End stage renal disease: Secondary | ICD-10-CM | POA: Insufficient documentation

## 2013-08-19 DIAGNOSIS — Z86718 Personal history of other venous thrombosis and embolism: Secondary | ICD-10-CM | POA: Insufficient documentation

## 2013-08-19 DIAGNOSIS — I2510 Atherosclerotic heart disease of native coronary artery without angina pectoris: Secondary | ICD-10-CM | POA: Insufficient documentation

## 2013-08-19 DIAGNOSIS — Z7901 Long term (current) use of anticoagulants: Secondary | ICD-10-CM | POA: Insufficient documentation

## 2013-08-19 DIAGNOSIS — E039 Hypothyroidism, unspecified: Secondary | ICD-10-CM | POA: Insufficient documentation

## 2013-08-19 DIAGNOSIS — Z452 Encounter for adjustment and management of vascular access device: Secondary | ICD-10-CM | POA: Insufficient documentation

## 2013-08-19 DIAGNOSIS — I252 Old myocardial infarction: Secondary | ICD-10-CM | POA: Insufficient documentation

## 2013-08-19 DIAGNOSIS — K219 Gastro-esophageal reflux disease without esophagitis: Secondary | ICD-10-CM | POA: Insufficient documentation

## 2013-08-19 DIAGNOSIS — M069 Rheumatoid arthritis, unspecified: Secondary | ICD-10-CM | POA: Insufficient documentation

## 2013-08-19 DIAGNOSIS — I120 Hypertensive chronic kidney disease with stage 5 chronic kidney disease or end stage renal disease: Secondary | ICD-10-CM | POA: Insufficient documentation

## 2013-08-19 LAB — PROTIME-INR: Prothrombin Time: 16.9 seconds — ABNORMAL HIGH (ref 11.6–15.2)

## 2013-08-19 NOTE — Progress Notes (Signed)
Dressing site WNL

## 2013-08-19 NOTE — Progress Notes (Signed)
[facility] Catheter Removal Procedure Note  Diagnosis: ESRD  Plan:  Remove right diatek catheter  Consent signed:  yes Time out completed:  yes Coumadin:  yes PT/INR (if applicable):  1.41 Other labs:  Procedure: 1.  Sterile prepping and draping over catheter area 2. 7 ml 2% lidocaine plain instilled at removal site. 3.  right catheter removed in its entirety with cuff in tact. 4.  Complications:  None 5. Tip of catheter sent for culture:  no   Patient tolerated procedure well:  yes Pressure held, no bleeding noted, dressing applied Instructions given to the pt regarding wound care and bleeding.  Other:  Instructed pt to restart coumadin 08/20/13 and have INR checked 08/21/13 for further instructions on dosage.   Doreatha Massed, PA-C 08/19/2013 2:05 PM

## 2013-08-19 NOTE — Progress Notes (Signed)
[facility] SHORT STAY H&P  CC:  Catheter removal   HPI:  This is a 79 y.o. male here for diatek catheter removal.  He underwent a left Forearm AVG on 07/08/13 by Dr. Darrick Penna.  He originally underwent a right IJ diatek catheter on 05/26/13.  He is on coumadin and his INR today is 1.41.  He states they have used his left arm AVG 6x without difficulty.  Otherwise, his medical hx is unchanged.   Past Medical History  Diagnosis Date  . Hypertension   . Depression   . 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 Coronary artery disease     a. 05/2013 NSTEMI/Cath: LM 90d, LAD 90ost/69m, LCX 90ost, OM1 90p, RCA 100p;  b. 05/2013 CABG x 4: LIMA->LAD, VG->RI->OM, VG->PDA.  [name] ESRD on dialysis 06/2013    TTS HD in [place]  . Peripheral arterial disease   . Atrial flutter     a. Dx 05/2013-->dilt/coumadin.  [name] DVT (deep venous thrombosis) 02/2011    LLL  . GERD (gastroesophageal reflux disease)   . Compression fracture of cervical spine   . Rheumatoid arthritis   . Prostate cancer   . Stasis dermatitis     LLL, wears TED hose  . AAA (abdominal aortic aneurysm)   . 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 Chronic systolic CHF (congestive heart failure)     EF 45-55% per echo 05/28/13  . Hyperlipidemia   . Dysphagia     a. after prolonged intubation 05/2013; b. 06/2013 refused EDG/Dilatation.  . Myocardial infarction   . Hypothyroidism   . Stroke     denies  . Constipation     FH:  Non-Contributory  History   Social History  . Marital Status: Married    Spouse Name: N/A    Number of Children: N/A  . Years of Education: N/A   Occupational History  . Not on file.   Social History Main Topics  . Smoking status: Former Smoker -- 1.00 packs/day for 47 years    Quit date: 11/05/2000  . Smokeless tobacco: Never Used  . Alcohol Use: No     Comment: Previously drank 6 beers/day, quit in 1994  . Drug Use: No  . Sexual Activity: Not on file   Other Topics Concern  . Not on  file   Social History Narrative   Lives in [place] wife.    Allergies  Allergen Reactions  . 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 acetaminophen (TYLENOL) 325 MG tablet Take 1-2 tablets (325-650 mg total) by mouth every 6 (six) hours as needed for pain.      [name] amiodarone (PACERONE) 200 MG tablet Take 1 tablet (200 mg total) by mouth daily.  30 tablet  3  . aspirin 81 MG tablet Take 81 mg by mouth daily.      [name] atorvastatin (LIPITOR) 20 MG tablet Take 1 tablet (20 mg total) by mouth daily at 6 PM.  30 tablet  0  . calcium acetate (PHOSLO) 667 MG capsule Take 667 mg by mouth 3 (three) times daily with meals.      [name] diltiazem (CARDIZEM) 60 MG tablet Take 1 tablet (60 mg total) by mouth 2 (two) times daily.  60 tablet  6  . folic acid-vitamin b complex-vitamin c-selenium-zinc (DIALYVITE) 3 MG TABS tablet Take 1  tablet by mouth daily.      . hydrOXYzine (ATARAX/VISTARIL) 25 MG tablet Take 1 tablet (25 mg total) by mouth 3 (three) times daily as needed for itching.  90 tablet  0  . levothyroxine (SYNTHROID, LEVOTHROID) 100 MCG tablet Take 1 tablet (100 mcg total) by mouth daily before breakfast.  30 tablet  0  . meclizine (ANTIVERT) 25 MG tablet Take 25 mg by mouth as needed.      . Multiple Vitamin (MULTIVITAMIN) tablet Take 1 tablet by mouth daily.      . ondansetron (ZOFRAN) 8 MG tablet Take 4 mg by mouth every 8 (eight) hours as needed for nausea.      . pantoprazole (PROTONIX) 40 MG tablet Take 40 mg by mouth 2 (two) times daily.      . polyethylene glycol (MIRALAX / GLYCOLAX) packet Take 17 g by mouth as needed.      . warfarin (COUMADIN) 2.5 MG tablet Take 0.5 tablets (1.25 mg total) by mouth daily. Takes 1.25 Tues, Thurs, and Sat.  30 tablet  6   No current facility-administered medications for this encounter.    ROS:  See HPI  PHYSICAL EXAM  Filed Vitals:   08/19/13 1240  BP: 108/58  Pulse: 95  Temp:  97 F (36.1 C)  Resp: 18    Gen:  Well developed well nourished HEENT:  normocephalic Neck:  Right IJ catheter Heart:  RRR Lungs:  Non-labored Abdomen:  Soft/NT/ND Extremities:  Left arm AVG with good thrill and bruit Skin:  No obvious rashes Neuro:  In tact  Lab/X-ray:  Impression: This is a 79 y.o. male here for diatek catheter removal  Plan:  Removal of right diatek catheter  Doreatha Massed, PA-C [facility] [phone] 08/19/2013 1:59 PM

## 2013-08-28 ENCOUNTER — Ambulatory Visit (INDEPENDENT_AMBULATORY_CARE_PROVIDER_SITE_OTHER): Payer: Medicare Other | Admitting: *Deleted

## 2013-08-28 ENCOUNTER — Ambulatory Visit (INDEPENDENT_AMBULATORY_CARE_PROVIDER_SITE_OTHER): Payer: Medicare Other | Admitting: Cardiology

## 2013-08-28 VITALS — BP 116/75 | HR 99

## 2013-08-28 DIAGNOSIS — I4892 Unspecified atrial flutter: Secondary | ICD-10-CM

## 2013-08-28 DIAGNOSIS — I480 Paroxysmal atrial fibrillation: Secondary | ICD-10-CM

## 2013-08-28 DIAGNOSIS — E039 Hypothyroidism, unspecified: Secondary | ICD-10-CM

## 2013-08-28 DIAGNOSIS — Z5181 Encounter for therapeutic drug level monitoring: Secondary | ICD-10-CM

## 2013-08-28 DIAGNOSIS — I4891 Unspecified atrial fibrillation: Secondary | ICD-10-CM

## 2013-08-28 DIAGNOSIS — R110 Nausea: Secondary | ICD-10-CM

## 2013-08-28 DIAGNOSIS — Z7901 Long term (current) use of anticoagulants: Secondary | ICD-10-CM

## 2013-08-28 DIAGNOSIS — Z951 Presence of aortocoronary bypass graft: Secondary | ICD-10-CM

## 2013-08-28 LAB — POCT INR: INR: 1.8

## 2013-08-28 MED ORDER — DILTIAZEM HCL 30 MG PO TABS: 30.0000 mg | ORAL_TABLET | Freq: Two times a day (BID) | ORAL | Status: DC

## 2013-08-28 NOTE — Patient Instructions (Signed)
Your physician recommends that you schedule a follow-up appointment in: 2 months with Dr. Wyline Mood. This appointment will be scheduled today before you leave.  Your physician has recommended you make the following change in your medication:  Increase Diltizame 60 MG twice daily.  Continue all other medications the same.   Your physician recommends that you return for lab work in: Today for CMET, Lipase, TSH, Free T4, and abdominal xray.

## 2013-08-28 NOTE — Progress Notes (Signed)
Clinical Summary [name] is a 79 y.o.male seen today for follow up of the following problems  1. CAD  - recent CABG 05/13/13 4 vessel: LIMA to LAD, SVG to OM, SVG to PDA, SVG to ramus. Presented w/ NSTEMI.  - denies any chest pain. Some SOB w/ exertion at approx 1 block. No orthopnea, no PND. Denies any pain along incision site.  - compliant w/ meds   2. HTN  - ER visit 07/15/13 w/ low blood pressure with reported systolic blood pressure of 74 at home. + lightheadedness + nausea. Seemed to occur shortly after taking his medicines. No CP, SOB, or palpitations. Blood pressure in ER was 102/55.  - pt left ER AMA during workup. Trop neg x 1,  - episode of low bp during dialysis. Also has orthostatic dizziness   - last visit we decreased his dilt to 60mg  bid, since that change his home blood pressures have improved but still having some symptoms  3. Afib  - denies any recent palpitations  - on coumadin, checked here.  - no troubles w/ bleeding   4. Nausea - reports since surgery in July has had problems with nausea. Denies any vomiting or diarrhea. Normal appetite. No abdominal pain. Does report some troubles with constipation, has to take laxative at home to have regular bowel movements.     Past Medical History  Diagnosis Date  . Hypertension   . Depression   . 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 Coronary artery disease     a. 05/2013 NSTEMI/Cath: LM 90d, LAD 90ost/3m, LCX 90ost, OM1 90p, RCA 100p;  b. 05/2013 CABG x 4: LIMA->LAD, VG->RI->OM, VG->PDA.  [name] ESRD on dialysis 06/2013    TTS HD in [place]  . Peripheral arterial disease   . Atrial flutter     a. Dx 05/2013-->dilt/coumadin.  [name] DVT (deep venous thrombosis) 02/2011    LLL  . GERD (gastroesophageal reflux disease)   . Compression fracture of cervical spine   . Rheumatoid arthritis   . Prostate cancer   . Stasis dermatitis     LLL, wears TED hose  . AAA (abdominal aortic aneurysm)   . 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 Chronic systolic CHF (congestive heart failure)     EF 45-55% per echo 05/28/13  . Hyperlipidemia   . Dysphagia     a. after prolonged intubation 05/2013; b. 06/2013 refused EDG/Dilatation.  . Myocardial infarction   . Hypothyroidism   . Stroke     denies  . Constipation      Allergies  Allergen Reactions  . 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 acetaminophen (TYLENOL) 325 MG tablet Take 1-2 tablets (325-650 mg total) by mouth every 6 (six) hours as needed for pain.      [name] amiodarone (PACERONE) 200 MG tablet Take 1 tablet (200 mg total) by mouth daily.  30 tablet  3  . aspirin 81 MG tablet Take 81 mg by mouth daily.      [name] atorvastatin (LIPITOR) 20 MG tablet Take 1 tablet (20 mg total) by mouth daily at 6 PM.  30 tablet  0  . calcium acetate (PHOSLO) 667 MG capsule Take 667 mg by mouth 3 (three) times daily with meals.      [name] diltiazem (CARDIZEM) 60 MG tablet Take 1  tablet (60 mg total) by mouth 2 (two) times daily.  60 tablet  6  . folic acid-vitamin b complex-vitamin c-selenium-zinc (DIALYVITE) 3 MG TABS tablet Take 1 tablet by mouth daily.      . hydrOXYzine (ATARAX/VISTARIL) 25 MG tablet Take 1 tablet (25 mg total) by mouth 3 (three) times daily as needed for itching.  90 tablet  0  . levothyroxine (SYNTHROID, LEVOTHROID) 100 MCG tablet Take 1 tablet (100 mcg total) by mouth daily before breakfast.  30 tablet  0  . meclizine (ANTIVERT) 25 MG tablet Take 25 mg by mouth as needed.      . Multiple Vitamin (MULTIVITAMIN) tablet Take 1 tablet by mouth daily.      . ondansetron (ZOFRAN) 8 MG tablet Take 4 mg by mouth every 8 (eight) hours as needed for nausea.      . pantoprazole (PROTONIX) 40 MG tablet Take 40 mg by mouth 2 (two) times daily.      . polyethylene glycol (MIRALAX / GLYCOLAX) packet Take 17 g by mouth as needed.      . warfarin (COUMADIN) 2.5 MG tablet Take 0.5 tablets (1.25 mg  total) by mouth daily. Takes 1.25 Tues, Thurs, and Sat.  30 tablet  6   No current facility-administered medications for this visit.     Past Surgical History  Procedure Laterality Date  . Coronary artery bypass graft N/A 05/13/2013    Procedure: CORONARY ARTERY BYPASS GRAFTING (CABG);  Surgeon: Alleen Borne, MD;  Location: [facility] OR;  Service: Open Heart Surgery;  Laterality: N/A;  . Insertion of dialysis catheter N/A 05/26/2013    Procedure: INSERTION OF DIALYSIS CATHETER;  Surgeon: Sherren Kerns, MD;  Location: [facility] OR;  Service: Vascular;  Laterality: N/A;  right IJ  . Cardiac surgery    . Cataract extraction    . Eye surgery Left     cataract  . Av fistula placement Left 07/08/2013    Procedure: INSERTION OF ARTERIOVENOUS (AV) GORE-TEX GRAFT ARM;  Surgeon: Sherren Kerns, MD;  Location: [facility] OR;  Service: Vascular;  Laterality: Left;     Allergies  Allergen Reactions  . 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 Heart attack Mother     deceased @ 70  . Kidney failure Father     deceased @ 35  . Heart attack Son     deceased @ 46     Social History [name] reports that he quit smoking about 12 years ago. He has never used smokeless tobacco. [name] reports that he does not drink alcohol.   Review of Systems CONSTITUTIONAL: No weight loss, fever, chills, weakness or fatigue.  HEENT: Eyes: No visual loss, blurred vision, double vision or yellow sclerae.No hearing loss, sneezing, congestion, runny nose or sore throat.  SKIN: No rash or itching.  CARDIOVASCULAR: per HPI RESPIRATORY: No shortness of breath, cough or sputum.  GASTROINTESTINAL: per HPI GENITOURINARY: No burning on urination, no polyuria NEUROLOGICAL: No headache, dizziness, syncope, paralysis, ataxia, numbness or tingling in the extremities. No change in bowel or bladder control.  MUSCULOSKELETAL: No muscle, back pain, joint pain or stiffness.    LYMPHATICS: No enlarged nodes. No history of splenectomy.  PSYCHIATRIC: No history of depression or anxiety.  ENDOCRINOLOGIC: No reports of sweating, cold or heat intolerance. No polyuria or polydipsia.  [name]   Physical Examination p 99 bp 116/75 98%  Gen: resting comfortably, no acute distress HEENT: no scleral icterus, pupils equal round and reactive, no palptable cervical adenopathy,  CV: RRR, no m/r/g, no JVD, no carotid bruits Resp: Clear to auscultation bilaterally GI: abdomen is soft, non-tender, non-distended, normal bowel sounds, no hepatosplenomegaly MSK: extremities are warm, no edema.  Skin: warm, no rash Neuro:  no focal deficits Psych: appropriate affect   Diagnostic Studies 05/2013 Echo  - LVEF 45-50%, hypokinetic septal and mid and basal inferior walls. Mild LAE   Assessment and Plan  1. CAD  - no current symptoms  -  Low blood pressure with orthostatic symptoms, cannot ACE-I  2. HTN  - recent problems with hypotension - home numbers look better since decreasing dilt, but still having some symptoms  - will decrease diltiazem to 30mg  bid short acting, instructed if having symptoms can hold the night time dose. Was on dilt at discharge, may consider switching to beta blocker as he follows with me in clinic.   3. Afib  - denies any current symptoms  - repeat TSH, free T4 and LFTs  4. Nausea - unclear etiology. Also having some troubles with constipation. Will check abdominal xray, LFTs, lipase, TSH, free T4     Antoine Poche, M.D., F.A.C.C.

## 2013-09-03 ENCOUNTER — Telehealth: Payer: Self-pay | Admitting: Cardiology

## 2013-09-03 NOTE — Telephone Encounter (Signed)
Message copied by Burnice Logan on Thu Sep 03, 2013  3:42 PM ------      Message from: [place] F      Created: Thu Sep 03, 2013  9:46 AM       Please let patient know the xray of his stomach is normal, and his blood work overall is normal for him. He needs to follow up with his PCP to work up his nausea further, please forward these results to his pcp ------

## 2013-09-03 NOTE — Telephone Encounter (Signed)
Pt informed of results and test routed to PCP.

## 2013-09-11 ENCOUNTER — Ambulatory Visit (INDEPENDENT_AMBULATORY_CARE_PROVIDER_SITE_OTHER): Payer: Medicare Other | Admitting: *Deleted

## 2013-09-11 DIAGNOSIS — I480 Paroxysmal atrial fibrillation: Secondary | ICD-10-CM

## 2013-09-11 DIAGNOSIS — Z951 Presence of aortocoronary bypass graft: Secondary | ICD-10-CM

## 2013-09-11 DIAGNOSIS — Z7901 Long term (current) use of anticoagulants: Secondary | ICD-10-CM

## 2013-09-11 DIAGNOSIS — I4891 Unspecified atrial fibrillation: Secondary | ICD-10-CM

## 2013-09-11 LAB — POCT INR: INR: 1.7

## 2013-09-25 ENCOUNTER — Ambulatory Visit (INDEPENDENT_AMBULATORY_CARE_PROVIDER_SITE_OTHER): Payer: Medicare Other | Admitting: *Deleted

## 2013-09-25 DIAGNOSIS — I480 Paroxysmal atrial fibrillation: Secondary | ICD-10-CM

## 2013-09-25 DIAGNOSIS — Z951 Presence of aortocoronary bypass graft: Secondary | ICD-10-CM

## 2013-09-25 DIAGNOSIS — Z7901 Long term (current) use of anticoagulants: Secondary | ICD-10-CM

## 2013-09-25 DIAGNOSIS — I4891 Unspecified atrial fibrillation: Secondary | ICD-10-CM

## 2013-09-25 LAB — POCT INR: INR: 2.4

## 2013-09-28 IMAGING — CR DG CHEST 2V
2 series · 2 of 2 positions shown · non-contrast
Comparison: Chest x-ray of 05/31/2013

CLINICAL DATA: History of CABG, some chest pain and nausea

CHEST - 2 VIEW

[w chest lat]
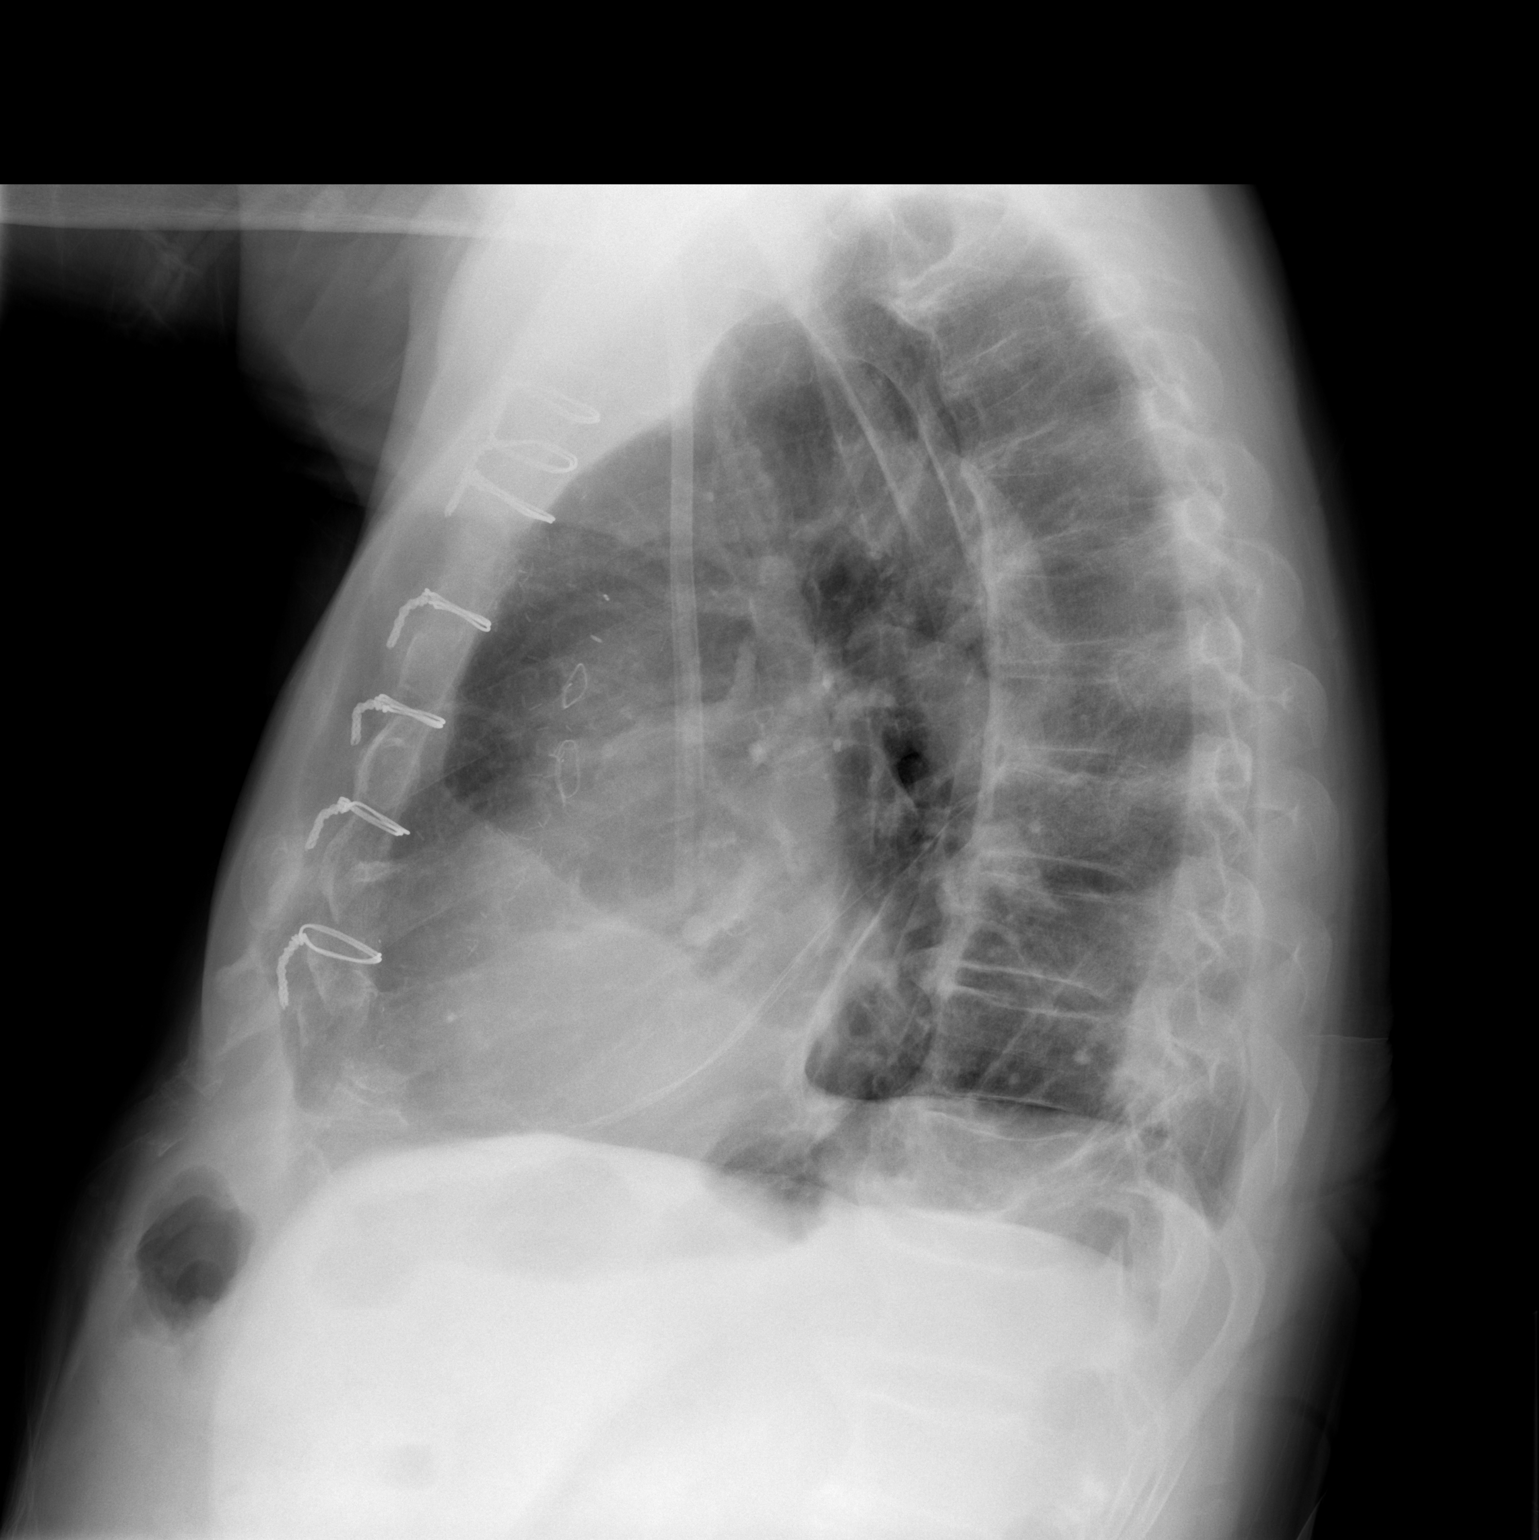

[w chest ap]
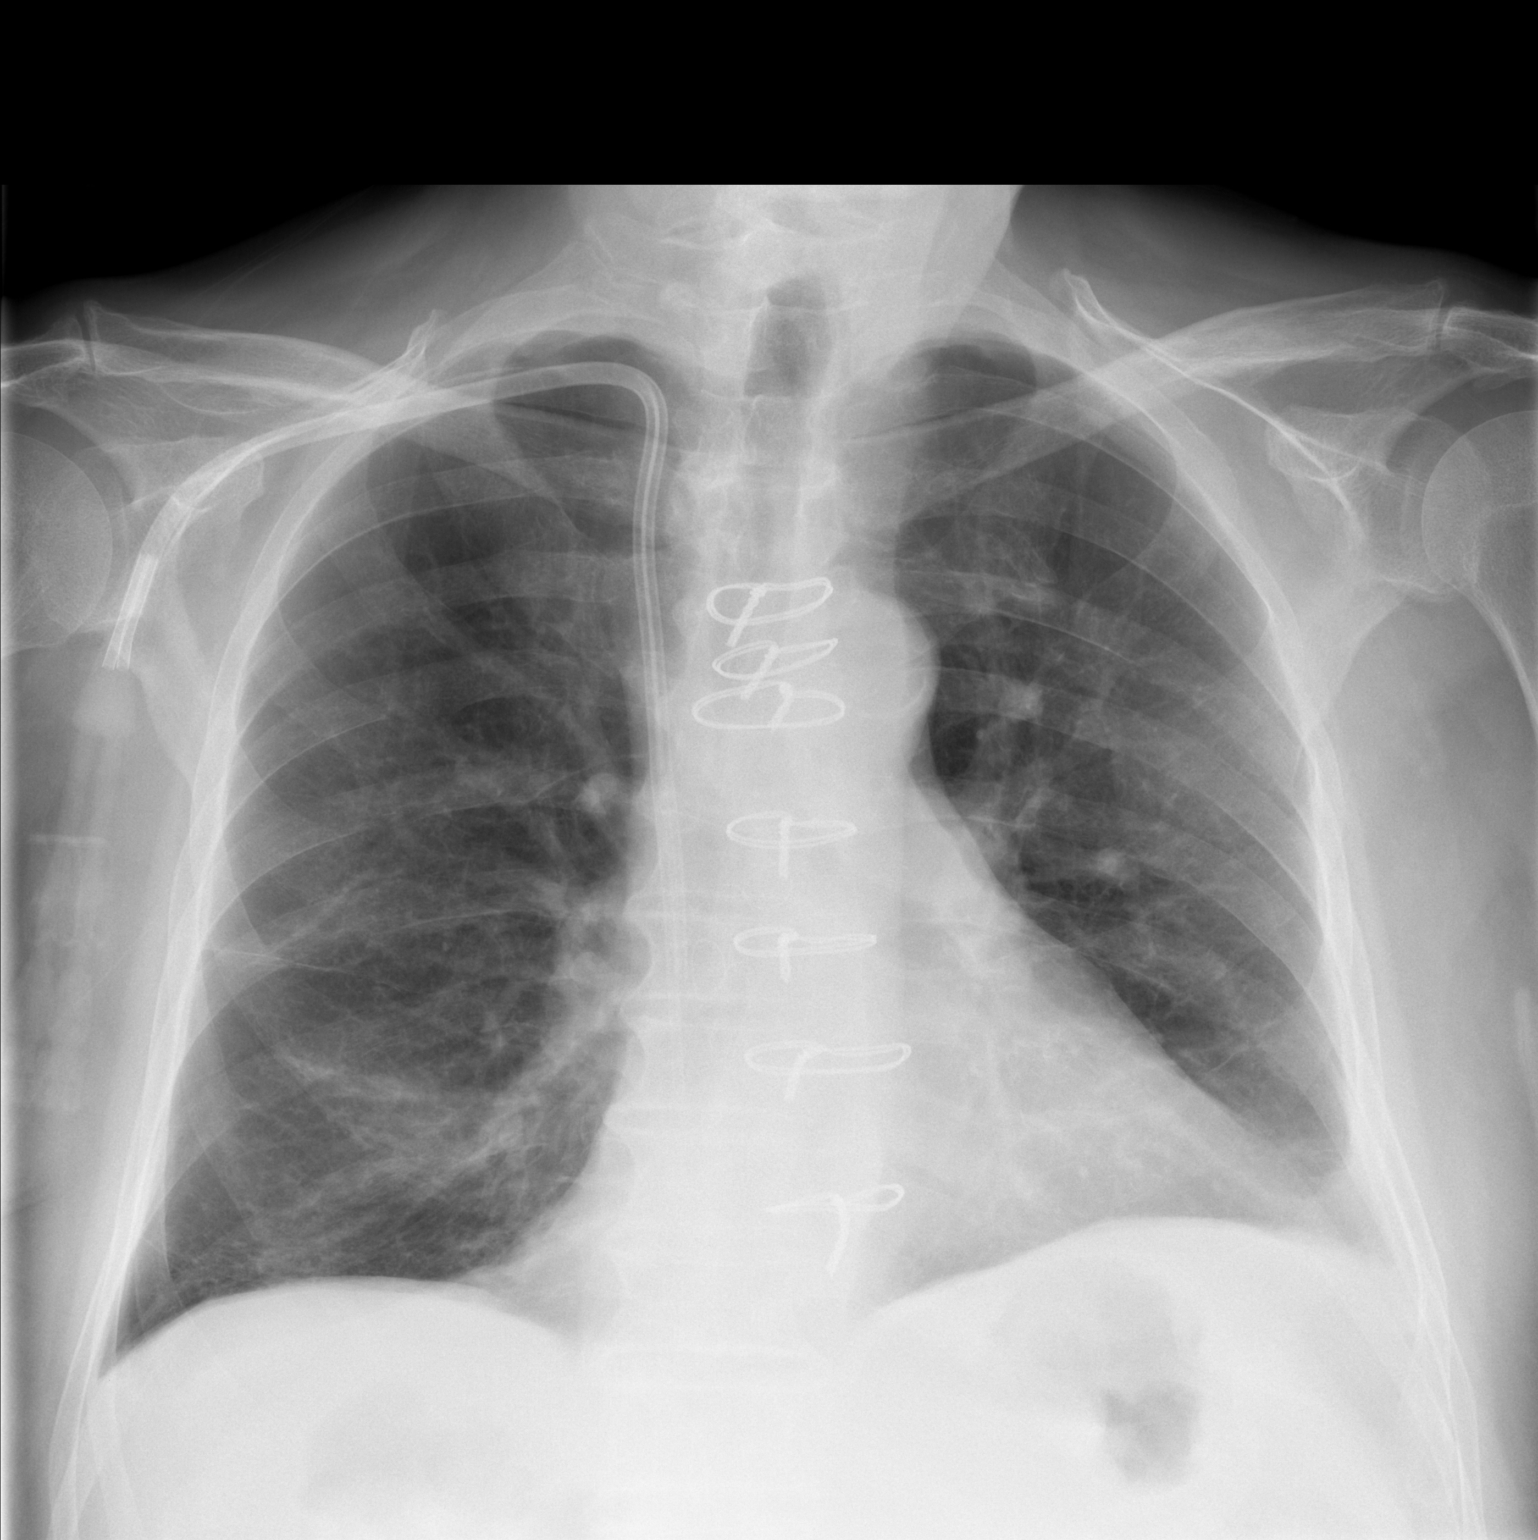

[2 of 2 positions shown; findings below may reference images not displayed]

FINDINGS: Only mild basilar atelectasis and/or scarring remains.
No effusion is seen.  Cardiomegaly is stable.  A right central
venous line is unchanged in position.  Median sternotomy sutures
are noted from prior CABG.
IMPRESSION: Mild bibasilar linear atelectasis and/or scarring.  Stable
cardiomegaly.

## 2013-10-04 IMAGING — CT CT HEAD W/O CM
2 series · 16 of 30 positions shown, 20 images · non-contrast
Comparison: MRI 03/01/2011.

CLINICAL DATA: Difficulty swallowing. Frontal headache.

EXAM:
CT HEAD WITHOUT CONTRAST
TECHNIQUE: Contiguous axial images were obtained from the base of the skull
through the vertex without intravenous contrast.

[Series 2: head w/o · axial · non-contrast · 0.49mm/px · z∈[+114,+254]mm · 13 of 34 slices shown, 17 images]
[im 3/34  brain]
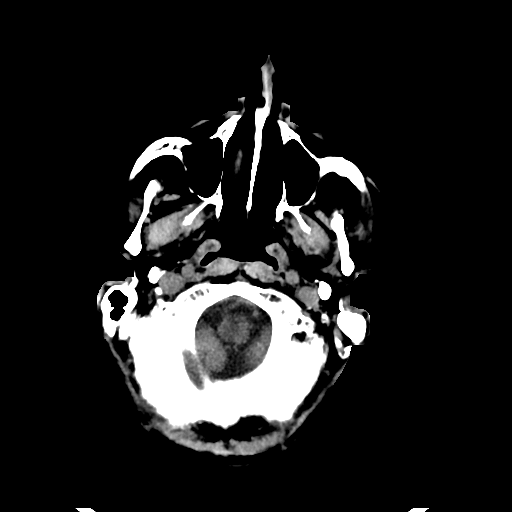
[im 3/34  bone]
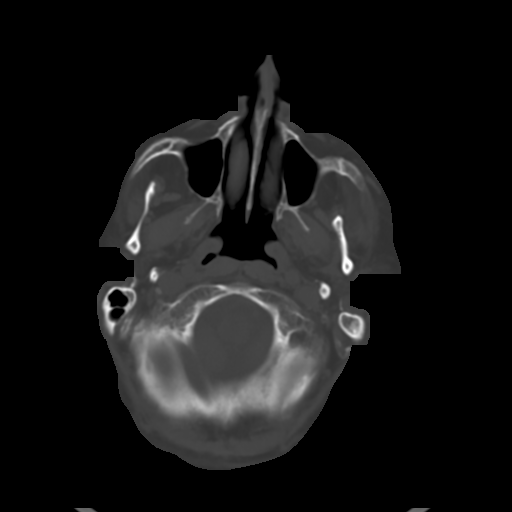
[im 5/34  brain]
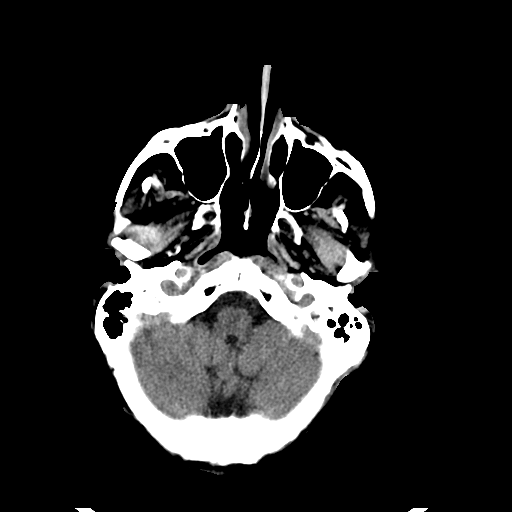
[im 8/34  brain]
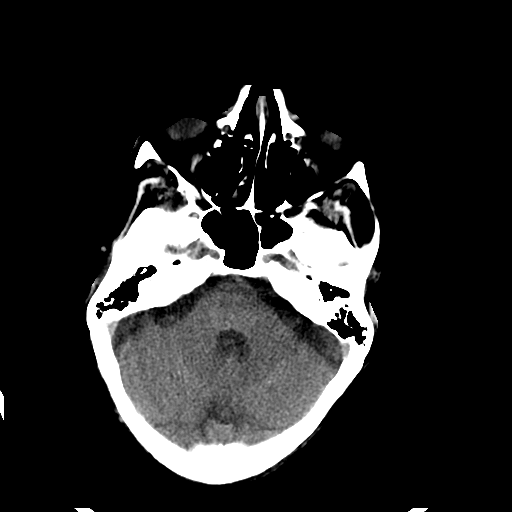
[im 10/34  brain]
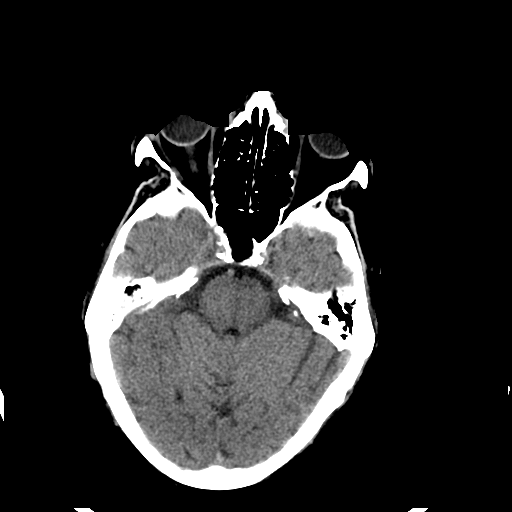
[im 12/34  brain]
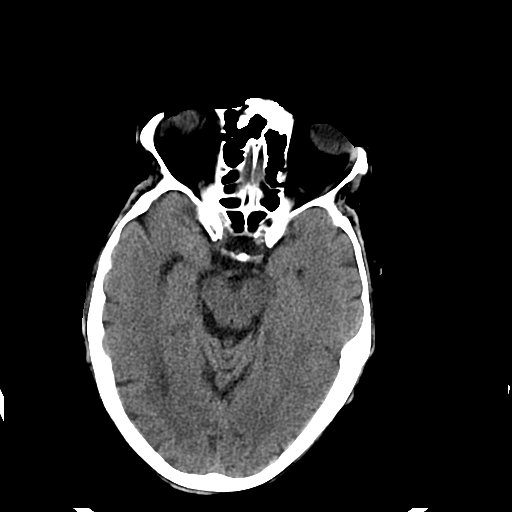
[im 12/34  bone]
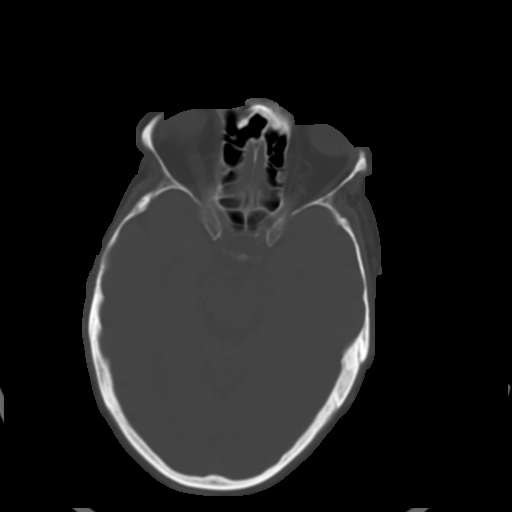
[im 15/34  brain]
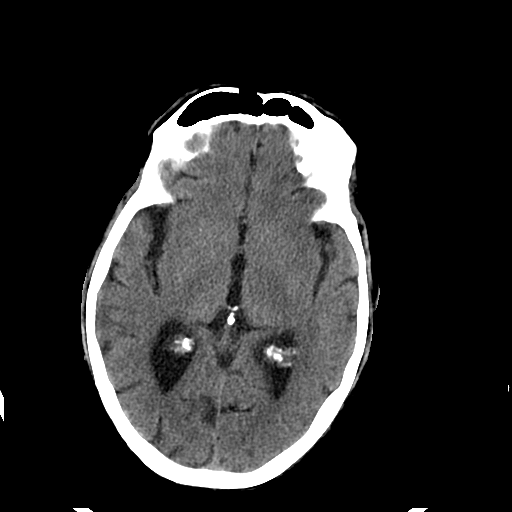
[im 17/34  brain]
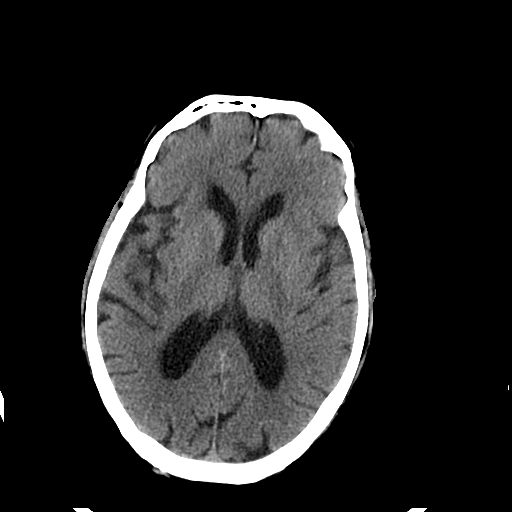
[im 19/34  brain]
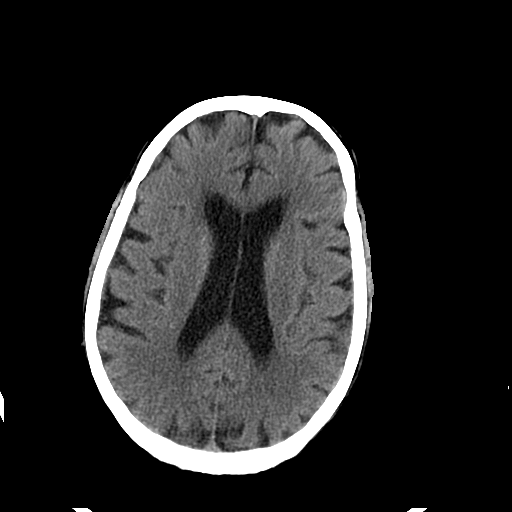
[im 22/34  brain]
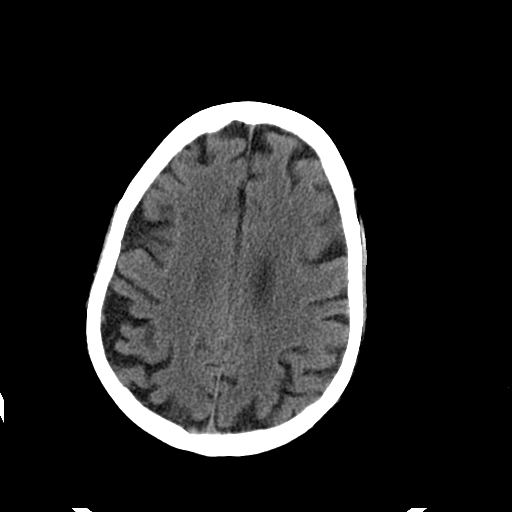
[im 22/34  bone]
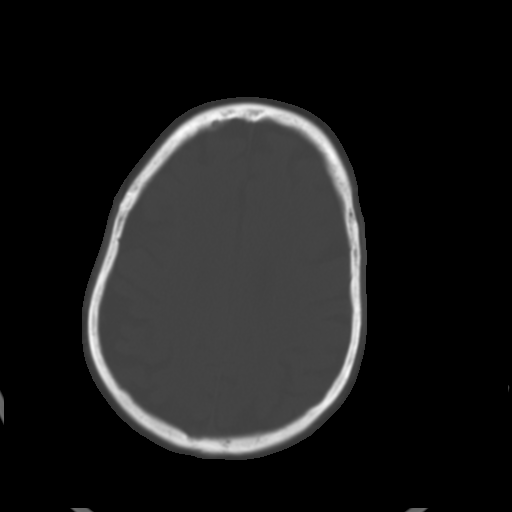
[im 24/34  brain]
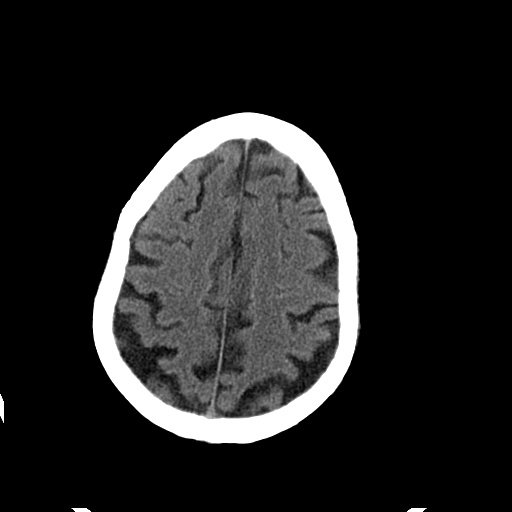
[im 26/34  brain]
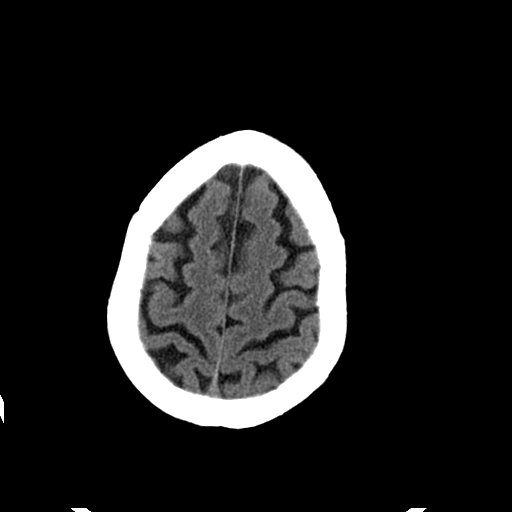
[im 29/34  brain]
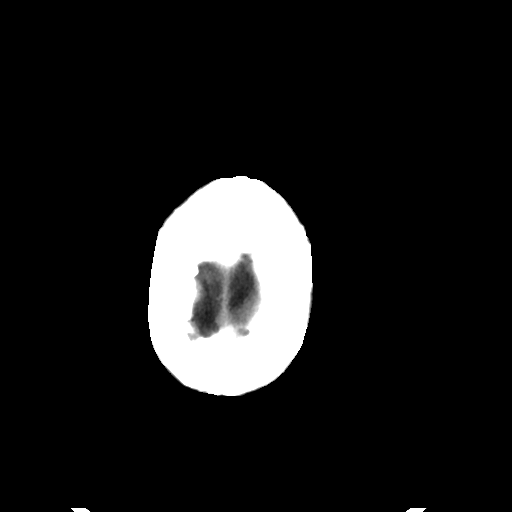
[im 31/34  brain]
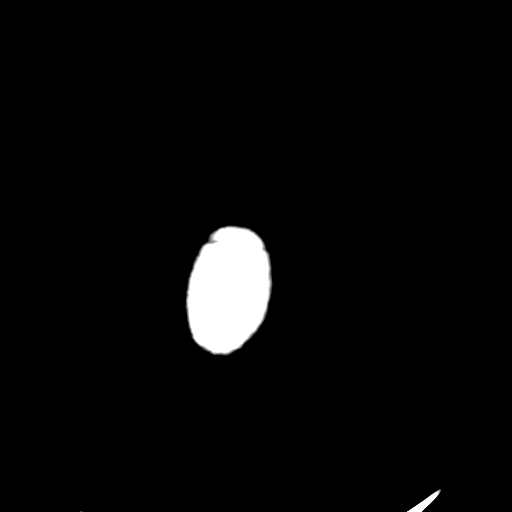
[im 31/34  bone]
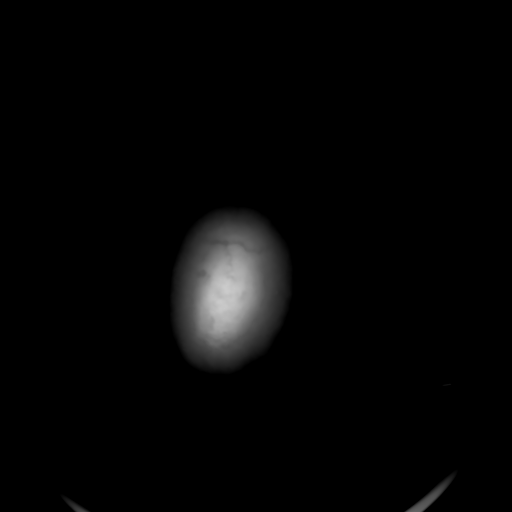

[Series 3: head w/o bone · axial · non-contrast · 0.49mm/px · z∈[+114,+159]mm · 3 of 34 slices shown]
[im 3/34  bone]
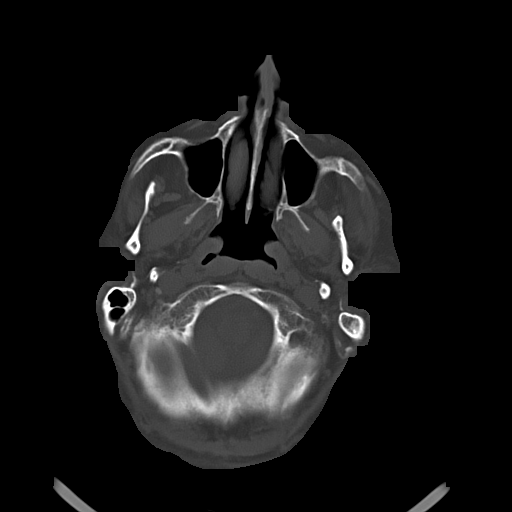
[im 8/34  bone]
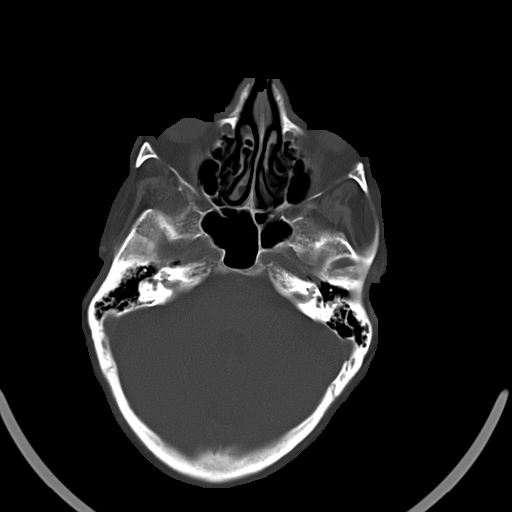
[im 12/34  bone]
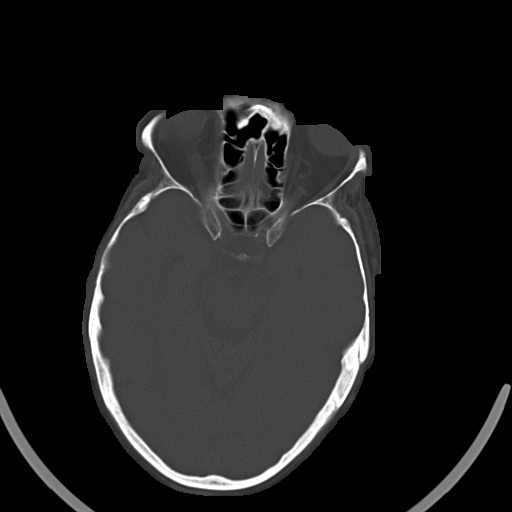

[16 of 30 positions shown; findings below may reference images not displayed]

FINDINGS: Mild age related volume loss. No acute intracranial abnormality.
Specifically, no hemorrhage, hydrocephalus, mass lesion, acute
infarction, or significant intracranial injury. No acute calvarial
abnormality. Visualized paranasal sinuses and mastoids clear.
Orbital soft tissues unremarkable.
IMPRESSION: No acute intracranial abnormality.

## 2013-10-16 ENCOUNTER — Ambulatory Visit (INDEPENDENT_AMBULATORY_CARE_PROVIDER_SITE_OTHER): Payer: Medicare Other | Admitting: *Deleted

## 2013-10-16 DIAGNOSIS — I480 Paroxysmal atrial fibrillation: Secondary | ICD-10-CM

## 2013-10-16 DIAGNOSIS — Z951 Presence of aortocoronary bypass graft: Secondary | ICD-10-CM

## 2013-10-16 DIAGNOSIS — I4891 Unspecified atrial fibrillation: Secondary | ICD-10-CM

## 2013-10-16 DIAGNOSIS — Z7901 Long term (current) use of anticoagulants: Secondary | ICD-10-CM

## 2013-10-16 LAB — POCT INR: INR: 1.4

## 2013-11-02 ENCOUNTER — Ambulatory Visit (INDEPENDENT_AMBULATORY_CARE_PROVIDER_SITE_OTHER): Payer: Medicare Other | Admitting: Cardiology

## 2013-11-02 VITALS — BP 125/55 | HR 75 | Ht 73.0 in | Wt 200.0 lb

## 2013-11-02 DIAGNOSIS — I2510 Atherosclerotic heart disease of native coronary artery without angina pectoris: Secondary | ICD-10-CM

## 2013-11-02 DIAGNOSIS — I10 Essential (primary) hypertension: Secondary | ICD-10-CM

## 2013-11-02 DIAGNOSIS — I4891 Unspecified atrial fibrillation: Secondary | ICD-10-CM

## 2013-11-02 NOTE — Patient Instructions (Signed)
Your physician recommends that you schedule a follow-up appointment in: 6 month with Dr. Wyline Mood. You should receive a letter in the mail in 4 months. If you do not receive this letter by April 2015 call our office to schedule this appointment.   Your physician recommends that you continue on your current medications as directed. Please refer to the Current Medication list given to you today.

## 2013-11-02 NOTE — Progress Notes (Signed)
Clinical Summary [name] is a 79 y.o.male  1. CAD  - recent CABG 05/13/13 4 vessel: LIMA to LAD, SVG to OM, SVG to PDA, SVG to ramus. Presented w/ NSTEMI.  - denies any chest pain. Some SOB w/ exertion at approx 1 block which is stable. No orthopnea, no PND. Denies any pain along incision site.  - compliant w/ meds   2. HTN  - previous problems with low blood pressures and dizziness, often associated with HD - symptoms improved with decreased fluid removal during HD sessions, we have also decreased his diltiazem over the last few visits.    3. Afib  - denies any recent palpitations  - on coumadin, he is followed in our coumadin clinic.  - denies any  troubles w/ bleeding   4. Hyperlipidemia - 05/2013 TC 172 TG 140 HDL 47 LDL 97. Appears he was on zocor around that time, switched to lipitor around that time.  - denies any significant side effects.    Past Medical History  Diagnosis Date  . Hypertension   . Depression   . 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 Coronary artery disease     a. 05/2013 NSTEMI/Cath: LM 90d, LAD 90ost/22m, LCX 90ost, OM1 90p, RCA 100p;  b. 05/2013 CABG x 4: LIMA->LAD, VG->RI->OM, VG->PDA.  [name] ESRD on dialysis 06/2013    TTS HD in [place]  . Peripheral arterial disease   . Atrial flutter     a. Dx 05/2013-->dilt/coumadin.  [name] DVT (deep venous thrombosis) 02/2011    LLL  . GERD (gastroesophageal reflux disease)   . Compression fracture of cervical spine   . Rheumatoid arthritis   . Prostate cancer   . Stasis dermatitis     LLL, wears TED hose  . AAA (abdominal aortic aneurysm)   . 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 Chronic systolic CHF (congestive heart failure)     EF 45-55% per echo 05/28/13  . Hyperlipidemia   . Dysphagia     a. after prolonged intubation 05/2013; b. 06/2013 refused EDG/Dilatation.  . Myocardial infarction   . Hypothyroidism   . Stroke     denies  . Constipation      Allergies  Allergen Reactions  . 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 acetaminophen (TYLENOL) 325 MG tablet Take 1-2 tablets (325-650 mg total) by mouth every 6 (six) hours as needed for pain.      [name] amiodarone (PACERONE) 200 MG tablet Take 1 tablet (200 mg total) by mouth daily.  30 tablet  3  . aspirin 81 MG tablet Take 81 mg by mouth daily.      [name] atorvastatin (LIPITOR) 20 MG tablet Take 1 tablet (20 mg total) by mouth daily at 6 PM.  30 tablet  0  . calcium acetate (PHOSLO) 667 MG capsule Take 667 mg by mouth 3 (three) times daily with meals.      [name] diltiazem (CARDIZEM) 30 MG tablet Take 1 tablet (30 mg total) by mouth 2 (two) times daily.  60 tablet  6  . folic acid-vitamin b complex-vitamin c-selenium-zinc (DIALYVITE) 3 MG TABS tablet Take 1 tablet by mouth daily.      . hydrOXYzine (ATARAX/VISTARIL) 25 MG tablet Take 1 tablet (25 mg total) by mouth 3 (three) times daily as needed for itching.  90 tablet  0  . levothyroxine (SYNTHROID, LEVOTHROID) 100 MCG tablet Take 1 tablet (100 mcg total) by mouth daily before breakfast.  30 tablet  0  . meclizine (ANTIVERT) 25 MG tablet Take 25 mg by mouth as needed.      . Multiple Vitamin (MULTIVITAMIN) tablet Take 1 tablet by mouth daily.      . ondansetron (ZOFRAN) 8 MG tablet Take 4 mg by mouth every 8 (eight) hours as needed for nausea.      . pantoprazole (PROTONIX) 40 MG tablet Take 40 mg by mouth 2 (two) times daily.      . polyethylene glycol (MIRALAX / GLYCOLAX) packet Take 17 g by mouth as needed.      . warfarin (COUMADIN) 2.5 MG tablet Take 0.5 tablets (1.25 mg total) by mouth daily. Takes 1.25 Tues, Thurs, and Sat.  30 tablet  6   No current facility-administered medications for this visit.     Past Surgical History  Procedure Laterality Date  . Coronary artery bypass graft N/A 05/13/2013    Procedure: CORONARY ARTERY BYPASS GRAFTING (CABG);  Surgeon: Alleen Borne, MD;  Location: [facility] OR;  Service:  Open Heart Surgery;  Laterality: N/A;  . Insertion of dialysis catheter N/A 05/26/2013    Procedure: INSERTION OF DIALYSIS CATHETER;  Surgeon: Sherren Kerns, MD;  Location: [facility] OR;  Service: Vascular;  Laterality: N/A;  right IJ  . Cardiac surgery    . Cataract extraction    . Eye surgery Left     cataract  . Av fistula placement Left 07/08/2013    Procedure: INSERTION OF ARTERIOVENOUS (AV) GORE-TEX GRAFT ARM;  Surgeon: Sherren Kerns, MD;  Location: [facility] OR;  Service: Vascular;  Laterality: Left;     Allergies  Allergen Reactions  . 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 Heart attack Mother     deceased @ 29  . Kidney failure Father     deceased @ 46  . Heart attack Son     deceased @ 77     Social History [name] reports that he quit smoking about 13 years ago. He has never used smokeless tobacco. [name] reports that he does not drink alcohol.   Review of Systems CONSTITUTIONAL: No weight loss, fever, chills, weakness or fatigue.  HEENT: Eyes: No visual loss, blurred vision, double vision or yellow sclerae.No hearing loss, sneezing, congestion, runny nose or sore throat.  SKIN: No rash or itching.  CARDIOVASCULAR: per HPI RESPIRATORY: No shortness of breath, cough or sputum.  GASTROINTESTINAL: No anorexia, nausea, vomiting or diarrhea. No abdominal pain or blood.  GENITOURINARY: No burning on urination, no polyuria NEUROLOGICAL: No headache, dizziness, syncope, paralysis, ataxia, numbness or tingling in the extremities. No change in bowel or bladder control.  MUSCULOSKELETAL: No muscle, back pain, joint pain or stiffness.  LYMPHATICS: No enlarged nodes. No history of splenectomy.  PSYCHIATRIC: No history of depression or anxiety.  ENDOCRINOLOGIC: No reports of sweating, cold or heat intolerance. No polyuria or polydipsia.  [name]   Physical Examination p 75 bp 125/55 Wt 200 lbs BMI 26 Gen: resting  comfortably, no acute distress HEENT: no scleral icterus, pupils equal round and reactive, no palptable cervical adenopathy,  CV: RRR, no m/r/g, no JVD, no carotid bruits Resp: Clear to auscultation bilaterally GI: abdomen is soft, non-tender, non-distended, normal bowel sounds, no hepatosplenomegaly MSK: extremities are  warm, no edema.  Skin: warm, no rash Neuro:  no focal deficits Psych: appropriate affect   Diagnostic Studies 05/2013 Echo  - LVEF 45-50%, hypokinetic septal and mid and basal inferior walls. Mild LAE  08/28/13 Abdominal xray Normal bowel gas pattern  08/28/13 Labs Na 136 K 4.9 AST 44 ALT 38 A phos 90 T.bili 0.8 TSH 2.92 Lipase 28     Assessment and Plan  1. CAD  - no current symptoms  - continue current medications  2. HTN  - recent problems with hypotension, has improved since cutting back on diltiazem and the amount of fluid removed during HD was decreased.  - continue to follow  3. Afib  - denies any current symptoms - continue current meds. On amiodarone recent normal LFTs and thyroid studies.        Antoine Poche, M.D., F.A.C.C.

## 2013-11-06 ENCOUNTER — Ambulatory Visit (INDEPENDENT_AMBULATORY_CARE_PROVIDER_SITE_OTHER): Payer: Medicare Other | Admitting: *Deleted

## 2013-11-06 DIAGNOSIS — I480 Paroxysmal atrial fibrillation: Secondary | ICD-10-CM

## 2013-11-06 DIAGNOSIS — I4891 Unspecified atrial fibrillation: Secondary | ICD-10-CM

## 2013-11-06 DIAGNOSIS — Z951 Presence of aortocoronary bypass graft: Secondary | ICD-10-CM

## 2013-11-06 DIAGNOSIS — Z7901 Long term (current) use of anticoagulants: Secondary | ICD-10-CM

## 2013-11-06 LAB — POCT INR: INR: 1.9

## 2013-11-20 ENCOUNTER — Ambulatory Visit (INDEPENDENT_AMBULATORY_CARE_PROVIDER_SITE_OTHER): Payer: Medicare Other | Admitting: *Deleted

## 2013-11-20 DIAGNOSIS — Z7901 Long term (current) use of anticoagulants: Secondary | ICD-10-CM

## 2013-11-20 DIAGNOSIS — I4891 Unspecified atrial fibrillation: Secondary | ICD-10-CM

## 2013-11-20 DIAGNOSIS — I480 Paroxysmal atrial fibrillation: Secondary | ICD-10-CM

## 2013-11-20 DIAGNOSIS — Z951 Presence of aortocoronary bypass graft: Secondary | ICD-10-CM

## 2013-11-20 LAB — POCT INR: INR: 2.1

## 2013-11-30 ENCOUNTER — Other Ambulatory Visit: Payer: Self-pay | Admitting: *Deleted

## 2013-11-30 MED ORDER — AMIODARONE HCL 200 MG PO TABS: 200.0000 mg | ORAL_TABLET | Freq: Every day | ORAL | Status: DC

## 2013-12-11 ENCOUNTER — Ambulatory Visit (INDEPENDENT_AMBULATORY_CARE_PROVIDER_SITE_OTHER): Payer: Medicare Other | Admitting: *Deleted

## 2013-12-11 DIAGNOSIS — Z5181 Encounter for therapeutic drug level monitoring: Secondary | ICD-10-CM | POA: Insufficient documentation

## 2013-12-11 DIAGNOSIS — I4891 Unspecified atrial fibrillation: Secondary | ICD-10-CM

## 2013-12-11 DIAGNOSIS — Z7901 Long term (current) use of anticoagulants: Secondary | ICD-10-CM

## 2013-12-11 DIAGNOSIS — Z951 Presence of aortocoronary bypass graft: Secondary | ICD-10-CM

## 2013-12-11 DIAGNOSIS — I480 Paroxysmal atrial fibrillation: Secondary | ICD-10-CM

## 2013-12-11 LAB — POCT INR: INR: 2.2

## 2014-01-08 ENCOUNTER — Ambulatory Visit (INDEPENDENT_AMBULATORY_CARE_PROVIDER_SITE_OTHER): Payer: Medicare Other | Admitting: *Deleted

## 2014-01-08 DIAGNOSIS — Z951 Presence of aortocoronary bypass graft: Secondary | ICD-10-CM

## 2014-01-08 DIAGNOSIS — Z5181 Encounter for therapeutic drug level monitoring: Secondary | ICD-10-CM

## 2014-01-08 DIAGNOSIS — Z7901 Long term (current) use of anticoagulants: Secondary | ICD-10-CM

## 2014-01-08 DIAGNOSIS — I480 Paroxysmal atrial fibrillation: Secondary | ICD-10-CM

## 2014-01-08 DIAGNOSIS — I4891 Unspecified atrial fibrillation: Secondary | ICD-10-CM

## 2014-01-08 LAB — POCT INR: INR: 1.6

## 2014-01-22 ENCOUNTER — Ambulatory Visit (INDEPENDENT_AMBULATORY_CARE_PROVIDER_SITE_OTHER): Payer: Medicare Other | Admitting: *Deleted

## 2014-01-22 DIAGNOSIS — I4891 Unspecified atrial fibrillation: Secondary | ICD-10-CM

## 2014-01-22 DIAGNOSIS — Z7901 Long term (current) use of anticoagulants: Secondary | ICD-10-CM

## 2014-01-22 DIAGNOSIS — Z5181 Encounter for therapeutic drug level monitoring: Secondary | ICD-10-CM

## 2014-01-22 DIAGNOSIS — I480 Paroxysmal atrial fibrillation: Secondary | ICD-10-CM

## 2014-01-22 DIAGNOSIS — Z951 Presence of aortocoronary bypass graft: Secondary | ICD-10-CM

## 2014-01-22 LAB — POCT INR: INR: 1.9

## 2014-01-22 MED ORDER — WARFARIN SODIUM 2.5 MG PO TABS: ORAL_TABLET | ORAL | Status: DC

## 2014-01-25 ENCOUNTER — Encounter: Payer: Self-pay | Admitting: Vascular Surgery

## 2014-01-25 ENCOUNTER — Ambulatory Visit: Payer: Self-pay | Admitting: Surgery

## 2014-01-26 ENCOUNTER — Ambulatory Visit (INDEPENDENT_AMBULATORY_CARE_PROVIDER_SITE_OTHER): Payer: Medicare Other | Admitting: Vascular Surgery

## 2014-01-26 ENCOUNTER — Encounter: Payer: Self-pay | Admitting: Vascular Surgery

## 2014-01-26 VITALS — BP 154/79 | HR 69 | Resp 16 | Ht 73.0 in | Wt 206.0 lb

## 2014-01-26 DIAGNOSIS — N186 End stage renal disease: Secondary | ICD-10-CM | POA: Insufficient documentation

## 2014-01-26 NOTE — Progress Notes (Signed)
Subjective:     Patient ID: [name], male   DOB: [birth year], 80 y.o.   MRN: [id number]  HPI this 80 year old male is referred by Dr. Otelia Santee for evaluation for left arm AV graft. Patient had a left forearm graft inserted by Dr. Juanda Crumble fields in September of 2014. This worked well for a few months but recently has had problems with thrombosis and is now deemed to be non-salvageable for an endovascular standpoint with a long area of basilic vein occlusion in the left arm. He denies any pain or numbness in the left hand. He has had no access in the right upper extremity. He had vein mapping performed before his initial surgery and it did not appear the veins were adequate for fistula creation.  Past Medical History  Diagnosis Date  . Hypertension   . Depression   . 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 Coronary artery disease     a. 05/2013 NSTEMI/Cath: LM 90d, LAD 90ost/16m, LCX 90ost, OM1 90p, RCA 100p;  b. 05/2013 CABG x 4: LIMA->LAD, VG->RI->OM, VG->PDA.  [name] ESRD on dialysis 06/2013    TTS HD in [place]  . Peripheral arterial disease   . Atrial flutter     a. Dx 05/2013-->dilt/coumadin.  [name] DVT (deep venous thrombosis) 02/2011    LLL  . GERD (gastroesophageal reflux disease)   . Compression fracture of cervical spine   . Rheumatoid arthritis   . Prostate cancer   . Stasis dermatitis     LLL, wears TED hose  . AAA (abdominal aortic aneurysm)   . 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 Chronic systolic CHF (congestive heart failure)     EF 45-55% per echo 05/28/13  . Hyperlipidemia   . Dysphagia     a. after prolonged intubation 05/2013; b. 06/2013 refused EDG/Dilatation.  . Myocardial infarction   . Hypothyroidism   . Stroke     denies  . Constipation     History  Substance Use Topics  . Smoking status: Former Smoker -- 1.00 packs/day for 12 years    Quit date: 11/05/2000  . Smokeless tobacco: Never Used  . Alcohol Use: No     Comment: Previously drank 6 beers/day,  quit in 1994    Family History  Problem Relation Age of Onset  . Heart attack Mother     deceased @ 38  . Kidney failure Father     deceased @ 97  . Heart attack Son     deceased @ 49    Allergies  Allergen Reactions  . 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    Current outpatient prescriptions:acetaminophen (TYLENOL) 325 MG tablet, Take 1-2 tablets (325-650 mg total) by mouth every 6 (six) hours as needed for pain., Disp: , Rfl: ;  amiodarone (PACERONE) 200 MG tablet, Take 1 tablet (200 mg total) by mouth daily., Disp: 30 tablet, Rfl: 6;  aspirin 81 MG tablet, Take 81 mg by mouth daily., Disp: , Rfl:  atorvastatin (LIPITOR) 20 MG tablet, Take 1 tablet (20 mg total) by mouth daily at 6 PM., Disp: 30 tablet, Rfl: 0;  diltiazem (CARDIZEM) 30 MG tablet, Take 1 tablet (30 mg total) by mouth 2 (two) times daily., Disp: 60 tablet, Rfl: 6;  folic acid-vitamin b complex-vitamin c-selenium-zinc (DIALYVITE) 3 MG TABS tablet, Take 1 tablet by mouth daily., Disp: , Rfl:  hydrOXYzine (ATARAX/VISTARIL) 25 MG tablet, Take 1 tablet (25  mg total) by mouth 3 (three) times daily as needed for itching., Disp: 90 tablet, Rfl: 0;  levothyroxine (SYNTHROID, LEVOTHROID) 100 MCG tablet, Take 1 tablet (100 mcg total) by mouth daily before breakfast., Disp: 30 tablet, Rfl: 0;  meclizine (ANTIVERT) 25 MG tablet, Take 25 mg by mouth as needed., Disp: , Rfl:  ondansetron (ZOFRAN) 8 MG tablet, Take 4 mg by mouth every 8 (eight) hours as needed for nausea., Disp: , Rfl: ;  pantoprazole (PROTONIX) 40 MG tablet, Take 40 mg by mouth 2 (two) times daily., Disp: , Rfl: ;  warfarin (COUMADIN) 2.5 MG tablet, Take coumadin 1 1/2 tablets daily or as directed, Disp: 45 tablet, Rfl: 3  BP 154/79  Pulse 69  Resp 16  Ht 6\' 1"  (1.854 m)  Wt 206 lb (93.441 kg)  BMI 27.18 kg/m2  Body mass index is 27.18 kg/(m^2).          Review of Systems patient denies chest pain, dyspnea on exertion, PND, orthopnea. Does complain of  weakness in his arms and legs as well as dizziness. He is on Coumadin therapy for atrial flutter and a history of possible DVT in the left leg. Has no history of embolic events. Other systems negative and complete review of systems.     Objective:   Physical Exam BP 154/79  Pulse 69  Resp 16  Ht 6\' 1"  (1.854 m)  Wt 206 lb (93.441 kg)  BMI 27.18 kg/m2  Gen.-alert and oriented x3 in no apparent distress HEENT normal for age Lungs no rhonchi or wheezing Cardiovascular regular rhythm no murmurs carotid pulses 3+ palpable no bruits audible Abdomen soft nontender no palpable masses Musculoskeletal free of  major deformities Skin clear -no rashes Neurologic normal Lower extremities 3+ femoral and dorsalis pedis pulses palpable bilaterally with no edema Left arm with thrombosed loop AV graft with no evidence of infection. 2+ radial and brachial pulses palpable.  Today I independently imaged the basilic vein in the right upper extremity and it does not appear to be large enough for a basilic vein transposition. Cephalic vein is also an adequate period        Assessment:     Patient with end-stage renal disease currently on hemodialysis via IJ tunneled catheter Needs attempted left upper arm AV graft    Plan:     Plan insertion left upper arm AV graft for Dr. Kellie Simmering on Wednesday, April 1 Discontinue Coumadin after Thursday, March 26 and will resume postoperatively

## 2014-01-27 ENCOUNTER — Other Ambulatory Visit: Payer: Self-pay | Admitting: *Deleted

## 2014-01-28 ENCOUNTER — Encounter (HOSPITAL_COMMUNITY): Payer: Self-pay | Admitting: Pharmacy Technician

## 2014-02-01 ENCOUNTER — Encounter (HOSPITAL_COMMUNITY): Payer: Self-pay | Admitting: *Deleted

## 2014-02-01 NOTE — Progress Notes (Signed)
02/01/14 1903  OBSTRUCTIVE SLEEP APNEA  Have you ever been diagnosed with sleep apnea through a sleep study? No  Do you snore loudly (loud enough to be heard through closed doors)?  0  Do you often feel tired, fatigued, or sleepy during the daytime? 1  Has anyone observed you stop breathing during your sleep? 0  Do you have, or are you being treated for high blood pressure? 1  BMI more than 35 kg/m2? 0  Age over 78 years old? 1  Gender: 1  Obstructive Sleep Apnea Score 4  Score 4 or greater  Results sent to PCP

## 2014-02-02 MED ORDER — SODIUM CHLORIDE 0.9 % IV SOLN
INTRAVENOUS | Status: DC
  Administered 2014-02-03: 10:00:00 via INTRAVENOUS

## 2014-02-02 MED ORDER — DEXTROSE 5 % IV SOLN
1.5000 g | INTRAVENOUS | Status: AC
  Administered 2014-02-03: 1.5 g via INTRAVENOUS
  Filled 2014-02-02: qty 1.5

## 2014-02-02 MED ORDER — CHLORHEXIDINE GLUCONATE CLOTH 2 % EX PADS: 6.0000 | MEDICATED_PAD | Freq: Once | CUTANEOUS | Status: DC

## 2014-02-03 ENCOUNTER — Ambulatory Visit (HOSPITAL_COMMUNITY): Payer: Medicare Other | Admitting: Anesthesiology

## 2014-02-03 ENCOUNTER — Encounter (HOSPITAL_COMMUNITY): Payer: Self-pay | Admitting: *Deleted

## 2014-02-03 ENCOUNTER — Ambulatory Visit (HOSPITAL_COMMUNITY)
Admission: RE | Admit: 2014-02-03 | Discharge: 2014-02-03 | Disposition: A | Discharge status: Home / Self Care | Payer: Medicare Other | Source: Ambulatory Visit | Attending: Vascular Surgery | Admitting: Vascular Surgery

## 2014-02-03 ENCOUNTER — Encounter (HOSPITAL_COMMUNITY)
Admission: RE | Disposition: A | Discharge status: Home / Self Care | Payer: Self-pay | Source: Ambulatory Visit | Attending: Vascular Surgery

## 2014-02-03 ENCOUNTER — Encounter (HOSPITAL_COMMUNITY): Payer: Medicare Other | Admitting: Anesthesiology

## 2014-02-03 DIAGNOSIS — M069 Rheumatoid arthritis, unspecified: Secondary | ICD-10-CM | POA: Insufficient documentation

## 2014-02-03 DIAGNOSIS — I252 Old myocardial infarction: Secondary | ICD-10-CM | POA: Insufficient documentation

## 2014-02-03 DIAGNOSIS — Z8546 Personal history of malignant neoplasm of prostate: Secondary | ICD-10-CM | POA: Insufficient documentation

## 2014-02-03 DIAGNOSIS — I5022 Chronic systolic (congestive) heart failure: Secondary | ICD-10-CM | POA: Insufficient documentation

## 2014-02-03 DIAGNOSIS — I2510 Atherosclerotic heart disease of native coronary artery without angina pectoris: Secondary | ICD-10-CM | POA: Insufficient documentation

## 2014-02-03 DIAGNOSIS — Z86718 Personal history of other venous thrombosis and embolism: Secondary | ICD-10-CM | POA: Insufficient documentation

## 2014-02-03 DIAGNOSIS — Z992 Dependence on renal dialysis: Secondary | ICD-10-CM | POA: Insufficient documentation

## 2014-02-03 DIAGNOSIS — F329 Major depressive disorder, single episode, unspecified: Secondary | ICD-10-CM | POA: Insufficient documentation

## 2014-02-03 DIAGNOSIS — Z951 Presence of aortocoronary bypass graft: Secondary | ICD-10-CM | POA: Insufficient documentation

## 2014-02-03 DIAGNOSIS — Z87891 Personal history of nicotine dependence: Secondary | ICD-10-CM | POA: Insufficient documentation

## 2014-02-03 DIAGNOSIS — I509 Heart failure, unspecified: Secondary | ICD-10-CM | POA: Insufficient documentation

## 2014-02-03 DIAGNOSIS — K219 Gastro-esophageal reflux disease without esophagitis: Secondary | ICD-10-CM | POA: Insufficient documentation

## 2014-02-03 DIAGNOSIS — F3289 Other specified depressive episodes: Secondary | ICD-10-CM | POA: Insufficient documentation

## 2014-02-03 DIAGNOSIS — E785 Hyperlipidemia, unspecified: Secondary | ICD-10-CM | POA: Insufficient documentation

## 2014-02-03 DIAGNOSIS — E039 Hypothyroidism, unspecified: Secondary | ICD-10-CM | POA: Insufficient documentation

## 2014-02-03 DIAGNOSIS — I4892 Unspecified atrial flutter: Secondary | ICD-10-CM | POA: Insufficient documentation

## 2014-02-03 DIAGNOSIS — N186 End stage renal disease: Secondary | ICD-10-CM

## 2014-02-03 DIAGNOSIS — I120 Hypertensive chronic kidney disease with stage 5 chronic kidney disease or end stage renal disease: Secondary | ICD-10-CM | POA: Insufficient documentation

## 2014-02-03 HISTORY — DX: Cardiac arrhythmia, unspecified: I49.9

## 2014-02-03 HISTORY — PX: AV FISTULA PLACEMENT: SHX1204

## 2014-02-03 LAB — APTT: aPTT: 32 seconds (ref 24–37)

## 2014-02-03 LAB — POCT I-STAT 4, (NA,K, GLUC, HGB,HCT)
GLUCOSE: 88 mg/dL (ref 70–99)
HEMATOCRIT: 36 % — AB (ref 39.0–52.0)
Hemoglobin: 12.2 g/dL — ABNORMAL LOW (ref 13.0–17.0)
POTASSIUM: 4.7 meq/L (ref 3.7–5.3)
Sodium: 142 mEq/L (ref 137–147)

## 2014-02-03 LAB — PROTIME-INR
INR: 1.13 (ref 0.00–1.49)
PROTHROMBIN TIME: 14.3 s (ref 11.6–15.2)

## 2014-02-03 SURGERY — INSERTION OF ARTERIOVENOUS (AV) GORE-TEX GRAFT ARM
Anesthesia: Monitor Anesthesia Care | Site: Arm Upper | Laterality: Left

## 2014-02-03 MED ORDER — PROPOFOL 10 MG/ML IV BOLUS
INTRAVENOUS | Status: AC
  Filled 2014-02-03: qty 20

## 2014-02-03 MED ORDER — DROPERIDOL 2.5 MG/ML IJ SOLN
0.6250 mg | INTRAMUSCULAR | Status: DC | PRN
  Filled 2014-02-03: qty 0.25

## 2014-02-03 MED ORDER — LIDOCAINE HCL (PF) 1 % IJ SOLN
INTRAMUSCULAR | Status: AC
  Filled 2014-02-03: qty 30

## 2014-02-03 MED ORDER — PROPOFOL INFUSION 10 MG/ML OPTIME
INTRAVENOUS | Status: DC | PRN
  Administered 2014-02-03: 100 ug/kg/min via INTRAVENOUS

## 2014-02-03 MED ORDER — ARTIFICIAL TEARS OP OINT
TOPICAL_OINTMENT | OPHTHALMIC | Status: AC
  Filled 2014-02-03: qty 3.5

## 2014-02-03 MED ORDER — THROMBIN 20000 UNITS EX SOLR
CUTANEOUS | Status: AC
  Filled 2014-02-03: qty 20000

## 2014-02-03 MED ORDER — FENTANYL CITRATE 0.05 MG/ML IJ SOLN
INTRAMUSCULAR | Status: AC
  Filled 2014-02-03: qty 5

## 2014-02-03 MED ORDER — LEPIRUDIN 50 MG IV SOLR
1.2500 mg/kg | Freq: Once | INTRAVENOUS | Status: DC
  Filled 2014-02-03: qty 2.34

## 2014-02-03 MED ORDER — OXYCODONE HCL 5 MG PO TABS: 5.0000 mg | ORAL_TABLET | Freq: Four times a day (QID) | ORAL | Status: DC | PRN

## 2014-02-03 MED ORDER — LIDOCAINE HCL (CARDIAC) 20 MG/ML IV SOLN
INTRAVENOUS | Status: AC
  Filled 2014-02-03: qty 5

## 2014-02-03 MED ORDER — THROMBIN 20000 UNITS EX SOLR
CUTANEOUS | Status: DC | PRN
  Administered 2014-02-03: 14:00:00 via TOPICAL

## 2014-02-03 MED ORDER — LIDOCAINE HCL (PF) 1 % IJ SOLN
INTRAMUSCULAR | Status: DC | PRN
  Administered 2014-02-03: 31 mL

## 2014-02-03 MED ORDER — FENTANYL CITRATE 0.05 MG/ML IJ SOLN
INTRAMUSCULAR | Status: DC | PRN
  Administered 2014-02-03: 50 ug via INTRAVENOUS

## 2014-02-03 MED ORDER — SODIUM CHLORIDE 0.9 % IV SOLN
INTRAVENOUS | Status: DC | PRN
  Administered 2014-02-03 (×2): via INTRAVENOUS

## 2014-02-03 MED ORDER — 0.9 % SODIUM CHLORIDE (POUR BTL) OPTIME
TOPICAL | Status: DC | PRN
  Administered 2014-02-03: 1000 mL

## 2014-02-03 MED ORDER — PROPOFOL 10 MG/ML IV BOLUS
INTRAVENOUS | Status: DC | PRN
  Administered 2014-02-03: 20 mg via INTRAVENOUS
  Administered 2014-02-03: 30 mg via INTRAVENOUS

## 2014-02-03 MED ORDER — BIVALIRUDIN BOLUS VIA INFUSION
0.2000 mg/kg | Freq: Once | INTRAVENOUS | Status: AC
  Administered 2014-02-03: 18.7 mg via INTRAVENOUS
  Filled 2014-02-03 (×2): qty 19

## 2014-02-03 MED ORDER — FENTANYL CITRATE 0.05 MG/ML IJ SOLN: 25.0000 ug | INTRAMUSCULAR | Status: DC | PRN

## 2014-02-03 MED ORDER — SODIUM CHLORIDE 0.9 % IV SOLN
INTRAVENOUS | Status: DC | PRN
  Administered 2014-02-03: 500 mL via INTRAMUSCULAR

## 2014-02-03 MED ORDER — PHENYLEPHRINE 40 MCG/ML (10ML) SYRINGE FOR IV PUSH (FOR BLOOD PRESSURE SUPPORT)
PREFILLED_SYRINGE | INTRAVENOUS | Status: AC
  Filled 2014-02-03: qty 10

## 2014-02-03 MED ORDER — MIDAZOLAM HCL 2 MG/2ML IJ SOLN
INTRAMUSCULAR | Status: AC
  Filled 2014-02-03: qty 2

## 2014-02-03 SURGICAL SUPPLY — 40 items
ADH SKN CLS APL DERMABOND .7 (GAUZE/BANDAGES/DRESSINGS) ×1
ARMBAND PINK RESTRICT EXTREMIT (MISCELLANEOUS) ×2 IMPLANT
CANISTER SUCTION 2500CC (MISCELLANEOUS) ×2 IMPLANT
CLIP TI MEDIUM 6 (CLIP) ×2 IMPLANT
CLIP TI WIDE RED SMALL 6 (CLIP) ×2 IMPLANT
COVER SURGICAL LIGHT HANDLE (MISCELLANEOUS) ×2 IMPLANT
DECANTER SPIKE VIAL GLASS SM (MISCELLANEOUS) ×2 IMPLANT
DERMABOND ADVANCED (GAUZE/BANDAGES/DRESSINGS) ×1
DERMABOND ADVANCED .7 DNX12 (GAUZE/BANDAGES/DRESSINGS) ×1 IMPLANT
ELECT REM PT RETURN 9FT ADLT (ELECTROSURGICAL) ×2
ELECTRODE REM PT RTRN 9FT ADLT (ELECTROSURGICAL) ×1 IMPLANT
GEL ULTRASOUND 20GR AQUASONIC (MISCELLANEOUS) ×1 IMPLANT
GLOVE BIO SURGEON STRL SZ 6.5 (GLOVE) ×2 IMPLANT
GLOVE BIO SURGEON STRL SZ7.5 (GLOVE) ×2 IMPLANT
GLOVE BIOGEL PI IND STRL 6.5 (GLOVE) IMPLANT
GLOVE BIOGEL PI IND STRL 7.0 (GLOVE) IMPLANT
GLOVE BIOGEL PI INDICATOR 6.5 (GLOVE) ×2
GLOVE BIOGEL PI INDICATOR 7.0 (GLOVE) ×2
GLOVE SURG SS PI 6.5 STRL IVOR (GLOVE) ×1 IMPLANT
GLOVE SURG SS PI 7.0 STRL IVOR (GLOVE) ×1 IMPLANT
GOWN STRL REUS W/ TWL LRG LVL3 (GOWN DISPOSABLE) ×3 IMPLANT
GOWN STRL REUS W/ TWL XL LVL3 (GOWN DISPOSABLE) IMPLANT
GOWN STRL REUS W/TWL LRG LVL3 (GOWN DISPOSABLE) ×6
GOWN STRL REUS W/TWL XL LVL3 (GOWN DISPOSABLE) ×2
GRAFT GORETEX STRT 4-7X45 (Vascular Products) ×1 IMPLANT
KIT BASIN OR (CUSTOM PROCEDURE TRAY) ×2 IMPLANT
KIT ROOM TURNOVER OR (KITS) ×2 IMPLANT
LOOP VESSEL MINI RED (MISCELLANEOUS) ×1 IMPLANT
NS IRRIG 1000ML POUR BTL (IV SOLUTION) ×2 IMPLANT
PACK CV ACCESS (CUSTOM PROCEDURE TRAY) ×2 IMPLANT
PAD ARMBOARD 7.5X6 YLW CONV (MISCELLANEOUS) ×4 IMPLANT
SPONGE SURGIFOAM ABS GEL 100 (HEMOSTASIS) ×1 IMPLANT
SUT PROLENE 6 0 CC (SUTURE) ×4 IMPLANT
SUT VIC AB 3-0 SH 27 (SUTURE) ×4
SUT VIC AB 3-0 SH 27X BRD (SUTURE) ×2 IMPLANT
SUT VICRYL 4-0 PS2 18IN ABS (SUTURE) ×4 IMPLANT
TOWEL OR 17X24 6PK STRL BLUE (TOWEL DISPOSABLE) ×2 IMPLANT
TOWEL OR 17X26 10 PK STRL BLUE (TOWEL DISPOSABLE) ×2 IMPLANT
UNDERPAD 30X30 INCONTINENT (UNDERPADS AND DIAPERS) ×2 IMPLANT
WATER STERILE IRR 1000ML POUR (IV SOLUTION) ×2 IMPLANT

## 2014-02-03 NOTE — H&P (View-Only) (Signed)
Subjective:     Patient ID: [name], male   DOB: [birth year], 80 y.o.   MRN: [id number]  HPI this 80 year old male is referred by Dr. Otelia Santee for evaluation for left arm AV graft. Patient had a left forearm graft inserted by Dr. Juanda Crumble fields in September of 2014. This worked well for a few months but recently has had problems with thrombosis and is now deemed to be non-salvageable for an endovascular standpoint with a long area of basilic vein occlusion in the left arm. He denies any pain or numbness in the left hand. He has had no access in the right upper extremity. He had vein mapping performed before his initial surgery and it did not appear the veins were adequate for fistula creation.  Past Medical History  Diagnosis Date  . Hypertension   . Depression   . 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 Coronary artery disease     a. 05/2013 NSTEMI/Cath: LM 90d, LAD 90ost/16m, LCX 90ost, OM1 90p, RCA 100p;  b. 05/2013 CABG x 4: LIMA->LAD, VG->RI->OM, VG->PDA.  [name] ESRD on dialysis 06/2013    TTS HD in [place]  . Peripheral arterial disease   . Atrial flutter     a. Dx 05/2013-->dilt/coumadin.  [name] DVT (deep venous thrombosis) 02/2011    LLL  . GERD (gastroesophageal reflux disease)   . Compression fracture of cervical spine   . Rheumatoid arthritis   . Prostate cancer   . Stasis dermatitis     LLL, wears TED hose  . AAA (abdominal aortic aneurysm)   . 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 Chronic systolic CHF (congestive heart failure)     EF 45-55% per echo 05/28/13  . Hyperlipidemia   . Dysphagia     a. after prolonged intubation 05/2013; b. 06/2013 refused EDG/Dilatation.  . Myocardial infarction   . Hypothyroidism   . Stroke     denies  . Constipation     History  Substance Use Topics  . Smoking status: Former Smoker -- 1.00 packs/day for 12 years    Quit date: 11/05/2000  . Smokeless tobacco: Never Used  . Alcohol Use: No     Comment: Previously drank 6 beers/day,  quit in 1994    Family History  Problem Relation Age of Onset  . Heart attack Mother     deceased @ 38  . Kidney failure Father     deceased @ 97  . Heart attack Son     deceased @ 49    Allergies  Allergen Reactions  . 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    Current outpatient prescriptions:acetaminophen (TYLENOL) 325 MG tablet, Take 1-2 tablets (325-650 mg total) by mouth every 6 (six) hours as needed for pain., Disp: , Rfl: ;  amiodarone (PACERONE) 200 MG tablet, Take 1 tablet (200 mg total) by mouth daily., Disp: 30 tablet, Rfl: 6;  aspirin 81 MG tablet, Take 81 mg by mouth daily., Disp: , Rfl:  atorvastatin (LIPITOR) 20 MG tablet, Take 1 tablet (20 mg total) by mouth daily at 6 PM., Disp: 30 tablet, Rfl: 0;  diltiazem (CARDIZEM) 30 MG tablet, Take 1 tablet (30 mg total) by mouth 2 (two) times daily., Disp: 60 tablet, Rfl: 6;  folic acid-vitamin b complex-vitamin c-selenium-zinc (DIALYVITE) 3 MG TABS tablet, Take 1 tablet by mouth daily., Disp: , Rfl:  hydrOXYzine (ATARAX/VISTARIL) 25 MG tablet, Take 1 tablet (25  mg total) by mouth 3 (three) times daily as needed for itching., Disp: 90 tablet, Rfl: 0;  levothyroxine (SYNTHROID, LEVOTHROID) 100 MCG tablet, Take 1 tablet (100 mcg total) by mouth daily before breakfast., Disp: 30 tablet, Rfl: 0;  meclizine (ANTIVERT) 25 MG tablet, Take 25 mg by mouth as needed., Disp: , Rfl:  ondansetron (ZOFRAN) 8 MG tablet, Take 4 mg by mouth every 8 (eight) hours as needed for nausea., Disp: , Rfl: ;  pantoprazole (PROTONIX) 40 MG tablet, Take 40 mg by mouth 2 (two) times daily., Disp: , Rfl: ;  warfarin (COUMADIN) 2.5 MG tablet, Take coumadin 1 1/2 tablets daily or as directed, Disp: 45 tablet, Rfl: 3  BP 154/79  Pulse 69  Resp 16  Ht 6\' 1"  (1.854 m)  Wt 206 lb (93.441 kg)  BMI 27.18 kg/m2  Body mass index is 27.18 kg/(m^2).          Review of Systems patient denies chest pain, dyspnea on exertion, PND, orthopnea. Does complain of  weakness in his arms and legs as well as dizziness. He is on Coumadin therapy for atrial flutter and a history of possible DVT in the left leg. Has no history of embolic events. Other systems negative and complete review of systems.     Objective:   Physical Exam BP 154/79  Pulse 69  Resp 16  Ht 6\' 1"  (1.854 m)  Wt 206 lb (93.441 kg)  BMI 27.18 kg/m2  Gen.-alert and oriented x3 in no apparent distress HEENT normal for age Lungs no rhonchi or wheezing Cardiovascular regular rhythm no murmurs carotid pulses 3+ palpable no bruits audible Abdomen soft nontender no palpable masses Musculoskeletal free of  major deformities Skin clear -no rashes Neurologic normal Lower extremities 3+ femoral and dorsalis pedis pulses palpable bilaterally with no edema Left arm with thrombosed loop AV graft with no evidence of infection. 2+ radial and brachial pulses palpable.  Today I independently imaged the basilic vein in the right upper extremity and it does not appear to be large enough for a basilic vein transposition. Cephalic vein is also an adequate period        Assessment:     Patient with end-stage renal disease currently on hemodialysis via IJ tunneled catheter Needs attempted left upper arm AV graft    Plan:     Plan insertion left upper arm AV graft for Dr. Kellie Simmering on Wednesday, April 1 Discontinue Coumadin after Thursday, March 26 and will resume postoperatively

## 2014-02-03 NOTE — Discharge Instructions (Signed)
° ° °  02/03/2014 [name] [id number] [birth year]  Surgeon(s): Elam Dutch, MD  Procedure(s): INSERTION OF ARTERIOVENOUS (AV) GORE-TEX GRAFT USING 4-7 MM X 45 CM STRETCH GORETEX GRAFT  x Do not stick graft for 4 weeks

## 2014-02-03 NOTE — Op Note (Signed)
Procedure: Left Upper Arm AV graft  Preop: ESRD  Postop: ESRD  Anesthesia: General  Findings:4-7 mm PTFE end to end to axillary vein   Procedure Details: The left upper extremity was prepped and draped in usual sterile fashion.  A longitudinal incision was then made near the antecubital crease the left arm.  There were no suitable antecubital veins for outflow.   The incision was carried into the subcutaneous tissues down to level of the brachial artery.  Next the brachial artery was dissected free in the medial portion incision. The artery was  3 mm in diameter. The vessel loops were placed proximal and distal to the planned site of arteriotomy. At this point, a longitudinal incision was made in the axilla and carried through the subcutaneous tissues and fascia to expose the axillary vein.  The nerves were protected.  The vein was approximately 4-5 mm in diameter. Next, a subcutaneous tunnel was created connecting the upper arm to the lower arm incision in an arcing configuration over the biceps muscle.  A 4-7 mm PTFE graft was then brought through this subcutaneous tunnel. The patient was given a weight based dose of Angiomax due to prior history of HIT. After appropriate circulation time, the vessel loops were used to control the artery. A longitudinal opening was made in the right brachial artery.  The 4 mm end of the graft was beveled and sewn end to side to the artery using a 6 0 prolene.  At completion of the anastomosis the artery was forward bled, backbled and thoroughly flushed.  The anastomosis was secured, vessel loops were released and there was palpable pulse in the graft.  The graft was clamped just above the arterial anastomosis with a fistula clamp. The graft was then pulled taut to length at the axillary incision.  The axillary vein was controlled with a fine bulldog clamp in the upper axilla.  The vein was transected and spatulated.  The distal end of the graft was then beveled and  sewn end to end to the vein using a running 6 0 prolene.  Just prior to completion of the anastomosis, everything was forward bled, back bled and thoroughly flushed.  The anastomosis was secured and the fistula clamp removed from the proximal graft.  A thrill was immediately palpable in the graft. After hemostasis was obtained, the subcutaneous tissues were reapproximated using a running 3-0 Vicryl suture. The skin was then closed with a 4 0 Vicryl subcuticular stitch. Dermabond was applied to the skin incisions.  The patient tolerated the procedure well and there were no complications.  Instrument sponge and needle count was correct at the end of the case.  The patient was taken to the recovery room in stable condition. The patient had audible radial and ulnar doppler signals at the end of the case.  Ruta Hinds, MD [facility] Office: [phone] Pager: [phone]

## 2014-02-03 NOTE — Anesthesia Preprocedure Evaluation (Addendum)
Anesthesia Evaluation  Patient identified by MRN, date of birth, ID band Patient awake    Reviewed: Allergy & Precautions, H&P , NPO status , Patient's Chart, lab work & pertinent test results  History of Anesthesia Complications Negative for: history of anesthetic complications  Airway Mallampati: II TM Distance: >3 FB Neck ROM: Full    Dental  (+) Edentulous Upper, Edentulous Lower   Pulmonary former smoker (quit '02),  breath sounds clear to auscultation  Pulmonary exam normal       Cardiovascular hypertension, Pt. on medications + CAD, + Past MI, + CABG (7/14 CABGx 4), + Peripheral Vascular Disease and DVT + dysrhythmias (coumadin: INR 1.9) Atrial Fibrillation Rhythm:Irregular Rate:Normal  '14 ECHO: EF 45-55%   Neuro/Psych    GI/Hepatic Neg liver ROS, GERD-  Medicated and Controlled,  Endo/Other  Hypothyroidism   Renal/GU ESRF and DialysisRenal disease (K+ 4.7)     Musculoskeletal   Abdominal   Peds  Hematology Hb 12.2   Anesthesia Other Findings   Reproductive/Obstetrics                          Anesthesia Physical Anesthesia Plan  ASA: III  Anesthesia Plan: MAC   Post-op Pain Management:    Induction: Intravenous  Airway Management Planned: Natural Airway and Simple Face Mask  Additional Equipment:   Intra-op Plan:   Post-operative Plan:   Informed Consent: I have reviewed the patients History and Physical, chart, labs and discussed the procedure including the risks, benefits and alternatives for the proposed anesthesia with the patient or authorized representative who has indicated his/her understanding and acceptance.     Plan Discussed with: Surgeon and CRNA  Anesthesia Plan Comments: (Plan routine monitors, MAC)        Anesthesia Quick Evaluation

## 2014-02-03 NOTE — Anesthesia Postprocedure Evaluation (Signed)
  Anesthesia Post-op Note  Patient: [name]  Procedure(s) Performed: Procedure(s): INSERTION OF ARTERIOVENOUS (AV) GORE-TEX GRAFT USING 4-7 MM X 45 CM STRETCH GORETEX GRAFT (Left)  Patient Location: PACU  Anesthesia Type:MAC  Level of Consciousness: awake, alert , oriented and patient cooperative  Airway and Oxygen Therapy: Patient Spontanous Breathing  Post-op Pain: none  Post-op Assessment: Post-op Vital signs reviewed, Patient's Cardiovascular Status Stable, Respiratory Function Stable, Patent Airway, No signs of Nausea or vomiting and Pain level controlled  Post-op Vital Signs: Reviewed and stable  Complications: No apparent anesthesia complications

## 2014-02-03 NOTE — Interval H&P Note (Signed)
History and Physical Interval Note:  02/03/2014 12:09 PM  [name]  has presented today for surgery, with the diagnosis of ESRD  The various methods of treatment have been discussed with the patient and family. After consideration of risks, benefits and other options for treatment, the patient has consented to  Procedure(s): INSERTION OF ARTERIOVENOUS (AV) GORE-TEX GRAFT ARM (Left) as a surgical intervention .  The patient's history has been reviewed, patient examined, no change in status, stable for surgery.  I have reviewed the patient's chart and labs.  Questions were answered to the patient's satisfaction.     FIELDS,CHARLES E

## 2014-02-03 NOTE — Transfer of Care (Signed)
Immediate Anesthesia Transfer of Care Note  Patient: [name]  Procedure(s) Performed: Procedure(s): INSERTION OF ARTERIOVENOUS (AV) GORE-TEX GRAFT USING 4-7 MM X 45 CM STRETCH GORETEX GRAFT (Left)  Patient Location: PACU  Anesthesia Type:MAC  Level of Consciousness: awake, alert  and oriented  Airway & Oxygen Therapy: Patient Spontanous Breathing and Patient connected to nasal cannula oxygen  Post-op Assessment: Report given to PACU RN, Post -op Vital signs reviewed and stable and Patient moving all extremities X 4  Post vital signs: Reviewed and stable  Complications: No apparent anesthesia complications

## 2014-02-04 ENCOUNTER — Encounter (HOSPITAL_COMMUNITY): Payer: Self-pay | Admitting: Vascular Surgery

## 2014-02-05 ENCOUNTER — Ambulatory Visit (INDEPENDENT_AMBULATORY_CARE_PROVIDER_SITE_OTHER): Payer: Medicare Other | Admitting: *Deleted

## 2014-02-05 DIAGNOSIS — Z5181 Encounter for therapeutic drug level monitoring: Secondary | ICD-10-CM

## 2014-02-05 DIAGNOSIS — I4891 Unspecified atrial fibrillation: Secondary | ICD-10-CM

## 2014-02-05 DIAGNOSIS — Z7901 Long term (current) use of anticoagulants: Secondary | ICD-10-CM

## 2014-02-05 DIAGNOSIS — Z951 Presence of aortocoronary bypass graft: Secondary | ICD-10-CM

## 2014-02-05 DIAGNOSIS — I480 Paroxysmal atrial fibrillation: Secondary | ICD-10-CM

## 2014-02-05 LAB — POCT INR: INR: 1.2

## 2014-02-08 ENCOUNTER — Telehealth: Payer: Self-pay | Admitting: Vascular Surgery

## 2014-02-08 NOTE — Telephone Encounter (Addendum)
Message copied by Gena Fray on Mon Feb 08, 2014  9:11 AM ------      Message from: Denman George      Created: Wed Feb 03, 2014  5:23 PM      Regarding: Micheline Rough; f/u appt @ 2 wks.                   ----- Message -----         From: Elam Dutch, MD         Sent: 02/03/2014   2:09 PM           To: [facility] Charge Pool            Left upper arm graft      [facility]st [place]            Needs follow up 2 weeks            Juanda Crumble ------  02/08/14: spoke with pt to schedule appointment, dpm

## 2014-02-12 ENCOUNTER — Ambulatory Visit (INDEPENDENT_AMBULATORY_CARE_PROVIDER_SITE_OTHER): Payer: Medicare Other | Admitting: *Deleted

## 2014-02-12 DIAGNOSIS — Z7901 Long term (current) use of anticoagulants: Secondary | ICD-10-CM

## 2014-02-12 DIAGNOSIS — Z5181 Encounter for therapeutic drug level monitoring: Secondary | ICD-10-CM

## 2014-02-12 DIAGNOSIS — I480 Paroxysmal atrial fibrillation: Secondary | ICD-10-CM

## 2014-02-12 DIAGNOSIS — Z951 Presence of aortocoronary bypass graft: Secondary | ICD-10-CM

## 2014-02-12 DIAGNOSIS — I4891 Unspecified atrial fibrillation: Secondary | ICD-10-CM

## 2014-02-12 LAB — POCT INR: INR: 1.8

## 2014-02-17 ENCOUNTER — Encounter: Payer: Self-pay | Admitting: Vascular Surgery

## 2014-02-18 ENCOUNTER — Encounter: Payer: Medicare Other | Admitting: Vascular Surgery

## 2014-02-26 ENCOUNTER — Ambulatory Visit (INDEPENDENT_AMBULATORY_CARE_PROVIDER_SITE_OTHER): Payer: Medicare Other | Admitting: *Deleted

## 2014-02-26 DIAGNOSIS — Z951 Presence of aortocoronary bypass graft: Secondary | ICD-10-CM

## 2014-02-26 DIAGNOSIS — I480 Paroxysmal atrial fibrillation: Secondary | ICD-10-CM

## 2014-02-26 DIAGNOSIS — Z7901 Long term (current) use of anticoagulants: Secondary | ICD-10-CM

## 2014-02-26 DIAGNOSIS — Z5181 Encounter for therapeutic drug level monitoring: Secondary | ICD-10-CM

## 2014-02-26 DIAGNOSIS — I4891 Unspecified atrial fibrillation: Secondary | ICD-10-CM

## 2014-02-26 LAB — POCT INR: INR: 2.2

## 2014-03-03 ENCOUNTER — Encounter: Payer: Self-pay | Admitting: Vascular Surgery

## 2014-03-04 ENCOUNTER — Encounter: Payer: Self-pay | Admitting: Vascular Surgery

## 2014-03-04 ENCOUNTER — Ambulatory Visit (INDEPENDENT_AMBULATORY_CARE_PROVIDER_SITE_OTHER): Payer: Self-pay | Admitting: Vascular Surgery

## 2014-03-04 VITALS — BP 139/51 | HR 65 | Ht 73.0 in | Wt 204.0 lb

## 2014-03-04 DIAGNOSIS — N186 End stage renal disease: Secondary | ICD-10-CM

## 2014-03-04 NOTE — Progress Notes (Signed)
Patient is a 80 year old male who is status post placement of a left upper arm graft approximately 2 weeks ago. He returns today for further followup. He is currently dialyzing via a catheter. He denies any numbness or tingling in his hand. He has no drainage from his incisions.  Physical exam:  Filed Vitals:   03/04/14 0900  BP: 139/51  Pulse: 65  Height: 6\' 1"  (1.854 m)  Weight: 204 lb (92.534 kg)  SpO2: 100%    Left upper arm graft easily palpable thrill well-healed incisions left hand well-perfused  Assessment: Patent left upper arm AV graft. It should be ready for cannulation in a few weeks. The patient will followup on as-needed basis.  Ruta Hinds, MD [facility] Office: [phone] Pager: [phone]

## 2014-03-19 ENCOUNTER — Ambulatory Visit (INDEPENDENT_AMBULATORY_CARE_PROVIDER_SITE_OTHER): Payer: Medicare Other | Admitting: *Deleted

## 2014-03-19 DIAGNOSIS — Z5181 Encounter for therapeutic drug level monitoring: Secondary | ICD-10-CM

## 2014-03-19 DIAGNOSIS — Z951 Presence of aortocoronary bypass graft: Secondary | ICD-10-CM

## 2014-03-19 DIAGNOSIS — I480 Paroxysmal atrial fibrillation: Secondary | ICD-10-CM

## 2014-03-19 DIAGNOSIS — Z7901 Long term (current) use of anticoagulants: Secondary | ICD-10-CM

## 2014-03-19 DIAGNOSIS — I4891 Unspecified atrial fibrillation: Secondary | ICD-10-CM

## 2014-03-19 LAB — POCT INR: INR: 1.3

## 2014-03-26 ENCOUNTER — Ambulatory Visit (INDEPENDENT_AMBULATORY_CARE_PROVIDER_SITE_OTHER): Payer: Medicare Other | Admitting: *Deleted

## 2014-03-26 DIAGNOSIS — Z951 Presence of aortocoronary bypass graft: Secondary | ICD-10-CM

## 2014-03-26 DIAGNOSIS — I4891 Unspecified atrial fibrillation: Secondary | ICD-10-CM

## 2014-03-26 DIAGNOSIS — Z5181 Encounter for therapeutic drug level monitoring: Secondary | ICD-10-CM

## 2014-03-26 DIAGNOSIS — Z7901 Long term (current) use of anticoagulants: Secondary | ICD-10-CM

## 2014-03-26 DIAGNOSIS — I480 Paroxysmal atrial fibrillation: Secondary | ICD-10-CM

## 2014-03-26 LAB — POCT INR: INR: 1.7

## 2014-04-09 ENCOUNTER — Ambulatory Visit (INDEPENDENT_AMBULATORY_CARE_PROVIDER_SITE_OTHER): Payer: Medicare Other | Admitting: *Deleted

## 2014-04-09 DIAGNOSIS — Z7901 Long term (current) use of anticoagulants: Secondary | ICD-10-CM

## 2014-04-09 DIAGNOSIS — I4891 Unspecified atrial fibrillation: Secondary | ICD-10-CM

## 2014-04-09 DIAGNOSIS — I480 Paroxysmal atrial fibrillation: Secondary | ICD-10-CM

## 2014-04-09 DIAGNOSIS — Z5181 Encounter for therapeutic drug level monitoring: Secondary | ICD-10-CM

## 2014-04-09 DIAGNOSIS — Z951 Presence of aortocoronary bypass graft: Secondary | ICD-10-CM

## 2014-04-09 LAB — POCT INR: INR: 4.5

## 2014-04-10 ENCOUNTER — Encounter (HOSPITAL_COMMUNITY): Payer: Self-pay | Admitting: Emergency Medicine

## 2014-04-10 ENCOUNTER — Emergency Department (HOSPITAL_COMMUNITY)
Admission: EM | Admit: 2014-04-10 | Discharge: 2014-04-10 | Disposition: A | Discharge status: Home / Self Care | Payer: Medicare Other | Attending: Emergency Medicine | Admitting: Emergency Medicine

## 2014-04-10 DIAGNOSIS — Z87891 Personal history of nicotine dependence: Secondary | ICD-10-CM | POA: Insufficient documentation

## 2014-04-10 DIAGNOSIS — M7989 Other specified soft tissue disorders: Secondary | ICD-10-CM

## 2014-04-10 DIAGNOSIS — Y832 Surgical operation with anastomosis, bypass or graft as the cause of abnormal reaction of the patient, or of later complication, without mention of misadventure at the time of the procedure: Secondary | ICD-10-CM | POA: Insufficient documentation

## 2014-04-10 DIAGNOSIS — N186 End stage renal disease: Secondary | ICD-10-CM | POA: Insufficient documentation

## 2014-04-10 DIAGNOSIS — I252 Old myocardial infarction: Secondary | ICD-10-CM | POA: Insufficient documentation

## 2014-04-10 DIAGNOSIS — I120 Hypertensive chronic kidney disease with stage 5 chronic kidney disease or end stage renal disease: Secondary | ICD-10-CM | POA: Insufficient documentation

## 2014-04-10 DIAGNOSIS — K219 Gastro-esophageal reflux disease without esophagitis: Secondary | ICD-10-CM | POA: Insufficient documentation

## 2014-04-10 DIAGNOSIS — I5022 Chronic systolic (congestive) heart failure: Secondary | ICD-10-CM | POA: Insufficient documentation

## 2014-04-10 DIAGNOSIS — Z8739 Personal history of other diseases of the musculoskeletal system and connective tissue: Secondary | ICD-10-CM | POA: Insufficient documentation

## 2014-04-10 DIAGNOSIS — I82409 Acute embolism and thrombosis of unspecified deep veins of unspecified lower extremity: Secondary | ICD-10-CM | POA: Insufficient documentation

## 2014-04-10 DIAGNOSIS — I6350 Cerebral infarction due to unspecified occlusion or stenosis of unspecified cerebral artery: Secondary | ICD-10-CM | POA: Insufficient documentation

## 2014-04-10 DIAGNOSIS — I499 Cardiac arrhythmia, unspecified: Secondary | ICD-10-CM | POA: Insufficient documentation

## 2014-04-10 DIAGNOSIS — Z992 Dependence on renal dialysis: Secondary | ICD-10-CM | POA: Insufficient documentation

## 2014-04-10 DIAGNOSIS — E039 Hypothyroidism, unspecified: Secondary | ICD-10-CM | POA: Insufficient documentation

## 2014-04-10 DIAGNOSIS — Z951 Presence of aortocoronary bypass graft: Secondary | ICD-10-CM | POA: Insufficient documentation

## 2014-04-10 DIAGNOSIS — I2510 Atherosclerotic heart disease of native coronary artery without angina pectoris: Secondary | ICD-10-CM | POA: Insufficient documentation

## 2014-04-10 DIAGNOSIS — H9190 Unspecified hearing loss, unspecified ear: Secondary | ICD-10-CM | POA: Insufficient documentation

## 2014-04-10 DIAGNOSIS — Z872 Personal history of diseases of the skin and subcutaneous tissue: Secondary | ICD-10-CM | POA: Insufficient documentation

## 2014-04-10 DIAGNOSIS — Z79899 Other long term (current) drug therapy: Secondary | ICD-10-CM | POA: Insufficient documentation

## 2014-04-10 DIAGNOSIS — Z452 Encounter for adjustment and management of vascular access device: Secondary | ICD-10-CM

## 2014-04-10 DIAGNOSIS — T82598A Other mechanical complication of other cardiac and vascular devices and implants, initial encounter: Secondary | ICD-10-CM | POA: Insufficient documentation

## 2014-04-10 DIAGNOSIS — Z8546 Personal history of malignant neoplasm of prostate: Secondary | ICD-10-CM | POA: Insufficient documentation

## 2014-04-10 DIAGNOSIS — E785 Hyperlipidemia, unspecified: Secondary | ICD-10-CM | POA: Insufficient documentation

## 2014-04-10 DIAGNOSIS — Z7901 Long term (current) use of anticoagulants: Secondary | ICD-10-CM | POA: Insufficient documentation

## 2014-04-10 LAB — COMPREHENSIVE METABOLIC PANEL
ALT: 31 U/L (ref 0–53)
AST: 38 U/L — ABNORMAL HIGH (ref 0–37)
Albumin: 3.8 g/dL (ref 3.5–5.2)
Alkaline Phosphatase: 80 U/L (ref 39–117)
BUN: 61 mg/dL — AB (ref 6–23)
CHLORIDE: 103 meq/L (ref 96–112)
CO2: 26 meq/L (ref 19–32)
CREATININE: 6.42 mg/dL — AB (ref 0.50–1.35)
Calcium: 8.8 mg/dL (ref 8.4–10.5)
GFR calc Af Amer: 9 mL/min — ABNORMAL LOW (ref >90)
GFR, EST NON AFRICAN AMERICAN: 7 mL/min — AB (ref >90)
Glucose, Bld: 95 mg/dL (ref 70–99)
Potassium: 4.9 mEq/L (ref 3.7–5.3)
Sodium: 146 mEq/L (ref 137–147)
Total Bilirubin: 0.7 mg/dL (ref 0.3–1.2)
Total Protein: 7.5 g/dL (ref 6.0–8.3)

## 2014-04-10 LAB — CBC WITH DIFFERENTIAL/PLATELET
Basophils Absolute: 0 10*3/uL (ref 0.0–0.1)
Basophils Relative: 0 % (ref 0–1)
Eosinophils Absolute: 0.2 10*3/uL (ref 0.0–0.7)
Eosinophils Relative: 2 % (ref 0–5)
HEMATOCRIT: 33.9 % — AB (ref 39.0–52.0)
HEMOGLOBIN: 10.2 g/dL — AB (ref 13.0–17.0)
LYMPHS PCT: 30 % (ref 12–46)
Lymphs Abs: 3.2 10*3/uL (ref 0.7–4.0)
MCH: 27.3 pg (ref 26.0–34.0)
MCHC: 30.1 g/dL (ref 30.0–36.0)
MCV: 90.9 fL (ref 78.0–100.0)
MONO ABS: 0.9 10*3/uL (ref 0.1–1.0)
MONOS PCT: 9 % (ref 3–12)
NEUTROS ABS: 6.3 10*3/uL (ref 1.7–7.7)
Neutrophils Relative %: 59 % (ref 43–77)
Platelets: 264 10*3/uL (ref 150–400)
RBC: 3.73 MIL/uL — ABNORMAL LOW (ref 4.22–5.81)
RDW: 18.5 % — ABNORMAL HIGH (ref 11.5–15.5)
WBC: 10.6 10*3/uL — AB (ref 4.0–10.5)

## 2014-04-10 LAB — PROTIME-INR
INR: 2.89 — AB (ref 0.00–1.49)
PROTHROMBIN TIME: 29.2 s — AB (ref 11.6–15.2)

## 2014-04-10 NOTE — ED Provider Notes (Signed)
CSN: [id number]     Arrival date & time 04/10/14  1202 History   First MD Initiated Contact with Patient 04/10/14 1343     Chief Complaint  Patient presents with  . Vascular Access Problem     (Consider location/radiation/quality/duration/timing/severity/associated sxs/prior Treatment) The history is provided by the patient. No language interpreter was used.  [name] is a 80 y/o M with PMhx of ESRD on dialysis Monday and Thursdays, HTN, CAD, PAD, DVT, AAA, RA, HLD, stroke, chronic systolic CHF, aflutter on coumadin presenting to the ED with swelling and erythema noted to the left upper extremity where AV fistula is located. Patient reported that he noticed the swelling and the redness on Thursday after dialysis session. Patient reported that he has been having pain to the left upper arm - reported that the discomfort is an aching sensation without radiation. Reported that he has noticed mild swelling to the left arm. Stated that he was seen and assessed by [facility] early today and came to [facility] as per request of [place] to see Dr. Scot Dock. Denied fall, injury, numbness, tingling, weakness, bleeding, drainage.  PCP Dr. Woody Seller Vascular Dr. Oneida Alar  Past Medical History  Diagnosis Date  . Hypertension   . Depression   . 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 Coronary artery disease     a. 05/2013 NSTEMI/Cath: LM 90d, LAD 90ost/27m, LCX 90ost, OM1 90p, RCA 100p;  b. 05/2013 CABG x 4: LIMA->LAD, VG->RI->OM, VG->PDA.  [name] Peripheral arterial disease   . Atrial flutter     a. Dx 05/2013-->dilt/coumadin.  [name] DVT (deep venous thrombosis) 02/2011    LLL  . GERD (gastroesophageal reflux disease)   . Compression fracture of cervical spine   . Rheumatoid arthritis   . Prostate cancer   . Stasis dermatitis     LLL, wears TED hose  . AAA (abdominal aortic aneurysm)   . 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 Chronic systolic CHF (congestive heart failure)     EF 45-55% per echo  05/28/13  . Hyperlipidemia   . Dysphagia     a. after prolonged intubation 05/2013; b. 06/2013 refused EDG/Dilatation.  . Myocardial infarction   . Hypothyroidism   . Stroke     denies  . Constipation   . Dysrhythmia   . ESRD on dialysis 06/2013    Mon and Thurs in [place]   Past Surgical History  Procedure Laterality Date  . Insertion of dialysis catheter N/A 05/26/2013    Procedure: INSERTION OF DIALYSIS CATHETER;  Surgeon: Elam Dutch, MD;  Location: [place];  Service: Vascular;  Laterality: N/A;  right IJ  . Cardiac surgery    . Cataract extraction    . Eye surgery Left     cataract  . Av fistula placement Left 07/08/2013    Procedure: INSERTION OF ARTERIOVENOUS (AV) GORE-TEX GRAFT ARM;  Surgeon: Elam Dutch, MD;  Location: [place];  Service: Vascular;  Laterality: Left;  . Coronary artery bypass graft N/A 05/13/2013    Procedure: CORONARY ARTERY BYPASS GRAFTING (CABG);  Surgeon: Gaye Pollack, MD;  Location: [place];  Service: Open Heart Surgery;  Laterality: N/A;  . Av fistula placement Left 02/03/2014    Procedure: INSERTION OF ARTERIOVENOUS (AV) GORE-TEX GRAFT USING 4-7 MM X 45 CM STRETCH GORETEX GRAFT;  Surgeon: Elam Dutch, MD;  Location: [facility] OR;  Service: Vascular;  Laterality: Left;   Family History  Problem Relation Age of Onset  . Heart attack Mother     deceased @ 35  . Kidney failure Father     deceased @ 54  . Heart attack Son     deceased @ 36   History  Substance Use Topics  . Smoking status: Former Smoker -- 1.00 packs/day for 32 years    Quit date: 11/05/2000  . Smokeless tobacco: Never Used  . Alcohol Use: No     Comment: Previously drank 6 beers/day, quit in 1994    Review of Systems  Constitutional: Negative for fever and chills.  Respiratory: Negative for shortness of breath.   Cardiovascular: Negative for chest pain.  Musculoskeletal: Positive for arthralgias (left upper arm and elbow).  Skin: Positive for color change.  Neurological: Negative  for tremors and numbness.      Allergies  Heparin  Home Medications   Prior to Admission medications   Medication Sig Start Date End Date Taking? Authorizing Provider  acetaminophen (TYLENOL) 325 MG tablet Take 1-2 tablets (325-650 mg total) by mouth every 6 (six) hours as needed for pain. 06/16/13  Yes Modena Jansky, MD  amiodarone (PACERONE) 200 MG tablet Take 1 tablet (200 mg total) by mouth daily. 11/30/13  Yes Arnoldo Lenis, MD  aspirin 81 MG tablet Take 81 mg by mouth daily.   Yes Historical Provider, MD  atorvastatin (LIPITOR) 20 MG tablet Take 1 tablet (20 mg total) by mouth daily at 6 PM. 06/16/13  Yes Modena Jansky, MD  diltiazem (CARDIZEM) 30 MG tablet Take 30 mg by mouth daily. 08/28/13  Yes Arnoldo Lenis, MD  folic acid-vitamin b complex-vitamin c-selenium-zinc (DIALYVITE) 3 MG TABS tablet Take 1 tablet by mouth daily.   Yes Historical Provider, MD  furosemide (LASIX) 40 MG tablet Take 90 mg by mouth 2 (two) times daily.   Yes Historical Provider, MD  hydrOXYzine (ATARAX/VISTARIL) 25 MG tablet Take 1 tablet (25 mg total) by mouth 3 (three) times daily as needed for itching. 07/03/13  Yes Marrion Coy, MD  levothyroxine (SYNTHROID, LEVOTHROID) 100 MCG tablet Take 1 tablet (100 mcg total) by mouth daily before breakfast. 06/16/13  Yes Modena Jansky, MD  meclizine (ANTIVERT) 25 MG tablet Take 25 mg by mouth 2 (two) times daily as needed for dizziness.    Yes Historical Provider, MD  Multiple Vitamin (MULTIVITAMIN) tablet Take 1 tablet by mouth daily.   Yes Historical Provider, MD  ondansetron (ZOFRAN) 8 MG tablet Take 4 mg by mouth every 8 (eight) hours as needed for nausea.   Yes Historical Provider, MD  oxyCODONE (ROXICODONE) 5 MG immediate release tablet Take 1 tablet (5 mg total) by mouth every 6 (six) hours as needed for severe pain. 02/03/14  Yes Samantha J Rhyne, PA-C  pantoprazole (PROTONIX) 40 MG tablet Take 40 mg by mouth 2 (two) times daily. 06/16/13   Yes Modena Jansky, MD  warfarin (COUMADIN) 2.5 MG tablet Take 3.75 mg by mouth daily at 6 PM.  01/22/14  Yes Arnoldo Lenis, MD   BP 164/119  Pulse 66  Temp(Src) 97.7 F (36.5 C) (Oral)  Resp 17  Wt 205 lb (92.987 kg)  SpO2 100% Physical Exam  Nursing note and vitals reviewed. Constitutional: He is oriented to person, place, and time. He appears well-developed and well-nourished. No distress.  HENT:  Head: Normocephalic and atraumatic.  Eyes: Conjunctivae and EOM are normal. Pupils are equal, round, and reactive to light. Right eye exhibits no discharge.  Left eye exhibits no discharge.  Neck: Normal range of motion. Neck supple.  Cardiovascular: Normal rate, regular rhythm and normal heart sounds.   Pulses:      Radial pulses are 2+ on the right side, and 2+ on the left side.  Cap refill < 3 seconds  Swelling and erythema noted to the left upper extremity - deltoid region medial aspect and extending to the left antecubital fossa. AV fistula noted with faint vibration. Discomfort upon palpation. Warmth upon palpation to the site.   Pulmonary/Chest: Effort normal and breath sounds normal. No respiratory distress. He has no wheezes. He has no rales. He exhibits no tenderness.  Musculoskeletal: Normal range of motion. He exhibits tenderness.       Arms: Full ROM to upper and lower extremities without difficulty noted, negative ataxia noted.  Neurological: He is alert and oriented to person, place, and time. No cranial nerve deficit. He exhibits normal muscle tone. Coordination normal.  Cranial nerves III-XII grossly intact Strength 5+/5+ to upper and lower extremities bilaterally with resistance applied, equal distribution noted Strength intact to MCP, PIP, DIP joints of left hand Equal grip strength Sensation intact   Skin: He is not diaphoretic. There is erythema.  Please see CV section     ED Course  Procedures (including critical care time)  3;31 PM This provider spoke  with Dr. Scot Dock - discussed case and that patient is in the ED setting. Dr. Scot Dock reported that he will come to see patient.   Results for orders placed during the hospital encounter of 04/10/14  CBC WITH DIFFERENTIAL      Result Value Ref Range   WBC 10.6 (*) 4.0 - 10.5 K/uL   RBC 3.73 (*) 4.22 - 5.81 MIL/uL   Hemoglobin 10.2 (*) 13.0 - 17.0 g/dL   HCT 33.9 (*) 39.0 - 52.0 %   MCV 90.9  78.0 - 100.0 fL   MCH 27.3  26.0 - 34.0 pg   MCHC 30.1  30.0 - 36.0 g/dL   RDW 18.5 (*) 11.5 - 15.5 %   Platelets 264  150 - 400 K/uL   Neutrophils Relative % 59  43 - 77 %   Neutro Abs 6.3  1.7 - 7.7 K/uL   Lymphocytes Relative 30  12 - 46 %   Lymphs Abs 3.2  0.7 - 4.0 K/uL   Monocytes Relative 9  3 - 12 %   Monocytes Absolute 0.9  0.1 - 1.0 K/uL   Eosinophils Relative 2  0 - 5 %   Eosinophils Absolute 0.2  0.0 - 0.7 K/uL   Basophils Relative 0  0 - 1 %   Basophils Absolute 0.0  0.0 - 0.1 K/uL  COMPREHENSIVE METABOLIC PANEL      Result Value Ref Range   Sodium 146  137 - 147 mEq/L   Potassium 4.9  3.7 - 5.3 mEq/L   Chloride 103  96 - 112 mEq/L   CO2 26  19 - 32 mEq/L   Glucose, Bld 95  70 - 99 mg/dL   BUN 61 (*) 6 - 23 mg/dL   Creatinine, Ser 6.42 (*) 0.50 - 1.35 mg/dL   Calcium 8.8  8.4 - 10.5 mg/dL   Total Protein 7.5  6.0 - 8.3 g/dL   Albumin 3.8  3.5 - 5.2 g/dL   AST 38 (*) 0 - 37 U/L   ALT 31  0 - 53 U/L   Alkaline Phosphatase 80  39 - 117 U/L  Total Bilirubin 0.7  0.3 - 1.2 mg/dL   GFR calc non Af Amer 7 (*) >90 mL/min   GFR calc Af Amer 9 (*) >90 mL/min  PROTIME-INR      Result Value Ref Range   Prothrombin Time 29.2 (*) 11.6 - 15.2 seconds   INR 2.89 (*) 0.00 - 1.49    Labs Review Labs Reviewed  CBC WITH DIFFERENTIAL - Abnormal; Notable for the following:    WBC 10.6 (*)    RBC 3.73 (*)    Hemoglobin 10.2 (*)    HCT 33.9 (*)    RDW 18.5 (*)    All other components within normal limits  COMPREHENSIVE METABOLIC PANEL - Abnormal; Notable for the following:    BUN 61  (*)    Creatinine, Ser 6.42 (*)    AST 38 (*)    GFR calc non Af Amer 7 (*)    GFR calc Af Amer 9 (*)    All other components within normal limits  PROTIME-INR - Abnormal; Notable for the following:    Prothrombin Time 29.2 (*)    INR 2.89 (*)    All other components within normal limits    Imaging Review No results found.   EKG Interpretation None      MDM   Final diagnoses:  Encounter for care related to vascular access port  Left arm swelling    Filed Vitals:   04/10/14 1202 04/10/14 1444  BP: 136/56 164/119  Pulse: 65 66  Temp: 97.7 F (36.5 C)   TempSrc: Oral   Resp: 17   Weight: 205 lb (92.987 kg)   SpO2: 100% 100%   CBC noted mildly elevated WBC of 10.6 with negative left shift or leukocytosis noted. CMP noted elevated BUN of 61 and Cr of 6.42 - patient has history of ESRD on dialysis. INR 2.89 and PT - 29.2.  Patient seen and assessed by Dr Scot Dock, Vascular Surgeon - as per physician reported that patient can be discharged. Did not recommend any further labs and imaging. As per physician recommended patient to not take Coumadin tonight, but to start it up tomorrow. Recommended patient to be seen by Coumadin Clinic on Monday. Reported that graft can be used. Does not believe to be thrombosed or infected at this time. As per vascular surgeon patient to be discharged. Pulses palpable and strong, radial 2+. AV fistula with good vibration. Strength intact. Negative focal neurological deficits noted. Patient stable, afebrile. Patient does not appear septic. Discharged patient. Referred patient to PCP and Vascular. Discussed with patient to closely monitor symptoms and if symptoms are to worsen or change to report back to the ED - strict return instructions given.  Patient agreed to plan of care, understood, all questions answered.   Jamse Mead, PA-C 04/10/14 [place], PA-C 04/10/14 1820

## 2014-04-10 NOTE — ED Notes (Signed)
Per pt sts went to dialysis on Thursday and since has had swelling in access area. Left upper arm swollen. Sent here by doctor

## 2014-04-10 NOTE — Consult Note (Signed)
Vascular and Vein Specialist of [facility]  Patient name: [name] MRN: [id number] DOB: 1934-09-03 Sex: male  REASON FOR CONSULT: Infiltrate of AV graft  HPI: [name] is a 80 y.o. male who had a left upper arm AV graft placed by Dr. Oneida Alar on 02/03/2014. He was last seen in follow up by Dr. Oneida Alar on 03/04/2014. At that time his graft was functioning and he was using a catheter for dialysis. It was felt that the graft could be used for cannulation in a few weeks. He began using the fistula approximately 2 weeks ago. The catheter was removed recently. He dialyzes on Mondays and Thursdays. On Thursday he developed bleeding in his left upper arm during dialysis but was able to successfully complete his dialysis treatment. He was concerned that the swelling increased and presented to [facility] emergency department today. He was then transferred here for further evaluation.  Of note, the patient is on Coumadin for a history of atrial flutter. He is followed in the Coumadin clinic and his INR yesterday was 4.5. He was told to hold his Coumadin yesterday so his last dose was Thursday (today is Saturday). His INR today at [facility] emergency department was 3.1.  He denies pain or paresthesias in his left hand. He has mild discomfort in his upper arm but says that the pain has improved.  Past Medical History  Diagnosis Date  . Hypertension   . Depression   . 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 Coronary artery disease     a. 05/2013 NSTEMI/Cath: LM 90d, LAD 90ost/51m, LCX 90ost, OM1 90p, RCA 100p;  b. 05/2013 CABG x 4: LIMA->LAD, VG->RI->OM, VG->PDA.  [name] Peripheral arterial disease   . Atrial flutter     a. Dx 05/2013-->dilt/coumadin.  [name] DVT (deep venous thrombosis) 02/2011    LLL  . GERD (gastroesophageal reflux disease)   . Compression fracture of cervical spine   . Rheumatoid arthritis   . Prostate cancer   . Stasis dermatitis     LLL, wears TED hose  . AAA (abdominal  aortic aneurysm)   . 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 Chronic systolic CHF (congestive heart failure)     EF 45-55% per echo 05/28/13  . Hyperlipidemia   . Dysphagia     a. after prolonged intubation 05/2013; b. 06/2013 refused EDG/Dilatation.  . Myocardial infarction   . Hypothyroidism   . Stroke     denies  . Constipation   . Dysrhythmia   . ESRD on dialysis 06/2013    Mon and Thurs in [place]   Family History  Problem Relation Age of Onset  . Heart attack Mother     deceased @ 43  . Kidney failure Father     deceased @ 59  . Heart attack Son     deceased @ 59    SOCIAL HISTORY: History  Substance Use Topics  . Smoking status: Former Smoker -- 1.00 packs/day for 59 years    Quit date: 11/05/2000  . Smokeless tobacco: Never Used  . Alcohol Use: No     Comment: Previously drank 6 beers/day, quit in 1994   Allergies  Allergen Reactions  . 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    No current facility-administered medications for this encounter.   Current Outpatient Prescriptions  Medication Sig Dispense Refill  . acetaminophen (TYLENOL) 325 MG tablet Take 1-2 tablets (325-650 mg total)  by mouth every 6 (six) hours as needed for pain.      [name] amiodarone (PACERONE) 200 MG tablet Take 1 tablet (200 mg total) by mouth daily.  30 tablet  6  . aspirin 81 MG tablet Take 81 mg by mouth daily.      [name] atorvastatin (LIPITOR) 20 MG tablet Take 1 tablet (20 mg total) by mouth daily at 6 PM.  30 tablet  0  . diltiazem (CARDIZEM) 30 MG tablet Take 30 mg by mouth daily.      . folic acid-vitamin b complex-vitamin c-selenium-zinc (DIALYVITE) 3 MG TABS tablet Take 1 tablet by mouth daily.      . furosemide (LASIX) 40 MG tablet Take 90 mg by mouth 2 (two) times daily.      . hydrOXYzine (ATARAX/VISTARIL) 25 MG tablet Take 1 tablet (25 mg total) by mouth 3 (three) times daily as needed for itching.  90 tablet  0  . levothyroxine (SYNTHROID, LEVOTHROID) 100 MCG tablet Take 1 tablet (100  mcg total) by mouth daily before breakfast.  30 tablet  0  . meclizine (ANTIVERT) 25 MG tablet Take 25 mg by mouth 2 (two) times daily as needed for dizziness.       . Multiple Vitamin (MULTIVITAMIN) tablet Take 1 tablet by mouth daily.      . ondansetron (ZOFRAN) 8 MG tablet Take 4 mg by mouth every 8 (eight) hours as needed for nausea.      [name] oxyCODONE (ROXICODONE) 5 MG immediate release tablet Take 1 tablet (5 mg total) by mouth every 6 (six) hours as needed for severe pain.  30 tablet  0  . pantoprazole (PROTONIX) 40 MG tablet Take 40 mg by mouth 2 (two) times daily.      [name] warfarin (COUMADIN) 2.5 MG tablet Take 3.75 mg by mouth daily at 6 PM.        REVIEW OF SYSTEMS: Valu.Nieves ] denotes positive finding; [  ] denotes negative finding CARDIOVASCULAR:  [ ]  chest pain   [ ]  chest pressure   [ ]  palpitations   [ ]  orthopnea   [ ]  dyspnea on exertion   [ ]  claudication   [ ]  rest pain   [ ]  DVT   [ ]  phlebitis PULMONARY:   [ ]  productive cough   [ ]  asthma   [ ]  wheezing NEUROLOGIC:   [ ]  weakness  [ ]  paresthesias  [ ]  aphasia  [ ]  amaurosis  [ ]  dizziness HEMATOLOGIC:   [ ]  bleeding problems   [ ]  clotting disorders MUSCULOSKELETAL:  [ ]  joint pain   [ ]  joint swelling [ ]  leg swelling GASTROINTESTINAL: [ ]   blood in stool  [ ]   hematemesis GENITOURINARY:  [ ]   dysuria  [ ]   hematuria PSYCHIATRIC:  [ ]  history of major depression INTEGUMENTARY:  [ ]  rashes  [ ]  ulcers CONSTITUTIONAL:  [ ]  fever   [ ]  chills  PHYSICAL EXAM: Filed Vitals:   04/10/14 1202 04/10/14 1444  BP: 136/56 164/119  Pulse: 65 66  Temp: 97.7 F (36.5 C)   TempSrc: Oral   Resp: 17   Weight: 205 lb (92.987 kg)   SpO2: 100% 100%   Body mass index is 27.05 kg/(m^2). GENERAL: The patient is a well-nourished male, in no acute distress. The vital signs are documented above. CARDIOVASCULAR: There is a regular rate and rhythm. I cannot palpate a left radial pulse. However, he has a brisk radial ulnar  and: Arch signal with  the Doppler. The left hand is warm and well-perfused. PULMONARY: There is good air exchange bilaterally without wheezing or rales. NEUROLOGIC: No focal weakness or paresthesias are detected in the left upper extremity. SKIN: There are no ulcers or rashes noted. He has moderate swelling of the left upper arm. His AV graft has an excellent bruit and thrill. PSYCHIATRIC: The patient has a normal affect.  DATA:  Lab Results  Component Value Date   WBC 10.6* 04/10/2014   HGB 10.2* 04/10/2014   HCT 33.9* 04/10/2014   MCV 90.9 04/10/2014   PLT 264 04/10/2014   Lab Results  Component Value Date   NA 142 02/03/2014   K 4.7 02/03/2014   CL 102 07/15/2013   CO2 24 07/15/2013   Lab Results  Component Value Date   CREATININE 4.01* 07/15/2013   Lab Results  Component Value Date   INR 4.5 04/09/2014   INR 1.7 03/26/2014   INR 1.3 03/19/2014   Lab Results  Component Value Date   HGBA1C 4.9 06/12/2013   MEDICAL ISSUES: This patient had an infiltrate of his left upper arm AV graft on Thursday.  I do not think there is any evidence of active bleeding. The graft has an excellent bruit and thrill in looks to be fairly easy to cannulate. Neurologic exam is intact and he has good perfusion to the left hand. His INR today was 3.1. I have instructed him not to take his Coumadin tonight and to begin taking it again tomorrow night (Sunday). He was then follow up with the Coumadin clinic on Monday to discontinue follow closely. I've explained that the options are to continue to use the graft which I think is reasonable. However if the swelling worsens or he develops new symptoms we would have to reverse his Coumadin, place a catheter, and consider exploration of the hematoma in his left upper arm if there were evidence of worsening symptoms.  Angelia Mould [facility] [place]: [phone]

## 2014-04-10 NOTE — ED Provider Notes (Signed)
Medical screening examination/treatment/procedure(s) were performed by non-physician practitioner and as supervising physician I was immediately available for consultation/collaboration.   EKG Interpretation None       Varney Biles, MD 04/10/14 1849

## 2014-04-10 NOTE — Discharge Instructions (Signed)
Please call your doctor for a followup appointment within 24-48 hours. When you talk to your doctor please let them know that you were seen in the emergency department and have them acquire all of your records so that they can discuss the findings with you and formulate a treatment plan to fully care for your new and ongoing problems. Please call and set-up an appointment with your primary care provider and your vascular surgeon  Please follow instructions as per Vascular Surgeon:            Please do not take dose of Coumadin tonight - please take dose tomorrow (Sunday)            Can continue to use graft for dialysis            Please call and follow-up with the Coumadin Clinic on Monday first thing in the morning Please rest and stay hydrated Please continue to monitor symptoms closely and if symptoms are to worsen or change (fever greater than 101, chills, sweating, nausea, vomiting, chest pain, shortness of breath, difficulty breathing, numbness, tingling, red streaks running down the arm, bleeding, drainage, fall, injury, worsening swelling to the arm) please report back to the ED immediately  Vascular Access for Hemodialysis A vascular access is a connection between two blood vessels that allows blood to be easily removed from the body and returned to the body during hemodialysis. Hemodialysis is a procedure in which a machine outside of the body filters the blood. There are three types of vascular accesses:   Arteriovenous fistula This is a connection between an artery and a vein (usually in the arm) that is made by sewing them together. Blood in the artery flows directly into the vein, causing it to get larger over time. This makes it easier for the vein to be used for hemodialysis. An arteriovenous fistula takes 1 6 months to develop after surgery.   Arteriovenous graft This is a connection between an artery and a vein in the arm that is made with a tube. An arteriovenous graft can be used  within 2 3 weeks of surgery.   Venous catheter This is a thin, flexible tube that is placed in a large vein (usually in the neck, chest, or groin). A venous catheter for hemodialysis contains two tubes that come out of the skin. A venous catheter can be used right away. It usually used as a temporary access, if you need hemodialysis before a fistula or graft has developed. It may also be used as a permanent access if a fistula or graft cannot be created. WHICH TYPE OF ACCESS IS BEST FOR ME? The type of access that is best for you depends on the size and strength of your veins.  A fistula is usually the preferred type of access. It can last several years and is less likely than the other types of accesses to become infected or to cause blood clots within a blood vessel (thrombosis). However, a fistula is not an option for everyone. If your veins are not the right size, a graft may be used instead. Grafts require you to have strong veins. If your veins are not strong enough for a graft, a catheter may be used. Catheters are more likely than fistulas and grafts to become infected or to have thrombosis.  Sometimes, only one type of access is an option. Your health care provider will help you determine which type of access is best for you.  HOW IS A VASCULAR  ACCESS USED? The way the access is used depends on the type of access:   If the access is a fistula or graft, two needles are inserted through the skin into the access before each hemodialysis session. Blood leaves the body through one of the needles and travels through a tube to the hemodialysis machine (dialyzer). It then flows through another tube and returns to the body through the second needle.   If the access is a catheter, one tube is connected directly to the tube that leads to the dialyzer and the other is connected to a tube that leads away from the dialyzer. Blood leaves the body through one tube and returns to the body through the other.   WHAT KIND OF PROBLEMS CAN OCCUR WITH VASCULAR ACCESSES?  Blood clots within a blood vessel (thrombosis). Thrombosis can lead to a narrowing of a blood vessel or tube (stenosis). If thrombosis occurs frequently, another access site may be created as a backup.   Infection.  These problems are most likely to occur with a venous catheter and least likely to occur with an arteriovenous fistula.  HOW DO I CARE FOR MY VASCULAR ACCESS? Wear a medical alert bracelet. This tells health care providers that you are a dialysis patient in the case of an emergency and allows them to care for your veins appropriately. If you have a graft or fistula:   A "bruit" is a noise that is heard with a stethoscope and a "thrill" is a vibration felt over the graft or fistula. The presence of the bruit and thrill indicates that the access is working. You will be taught to feel for the thrill each day. If this is not felt, the access may be clotted. Call your health care provider.   You may use the arm where your vascular access is located freely after the site heals. Keep the following in mind:   Avoid pressure on the arm.   Avoid lifting heavy objects with the arm.   Avoid sleeping on the arm.   Avoid wearing tight-sleeved shirts or jewelry around the graft or fistula.   Do not allow blood pressure monitoring or needle punctures on the side where the graft or fistula is located.   With permission from your health care provider, you may do exercises to help with blood flow through a fistula. These exercises involve squeezing a rubber ball or other soft objects as instructed. SEEK MEDICAL CARE IF:   Chills develop.   You have an oral temperature above 102 F (38.9 C).  Swelling around the graft or fistula gets worse.   New pain develops.   Pus or other fluid (drainage) is seen at the vascular access site.   Skin redness or red streaking is seen on the skin around, above, or below the vascular  access. SEEK IMMEDIATE MEDICAL CARE IF:   Pain, numbness, or an unusual pale skin color develops in the hand on the side of your fistula.   Dizziness or weakness develops that you have not had before.   The vascular access has bleeding that cannot be easily controlled. Document Released: 01/12/2003 Document Revised: 06/24/2013 Document Reviewed: 03/10/2013 [facility] Patient Information 2014 [place], [place].

## 2014-04-12 ENCOUNTER — Telehealth: Payer: Self-pay

## 2014-04-12 NOTE — Telephone Encounter (Signed)
Wife called to report pt. went to the ER, Saturday, for swelling of left upper arm from shoulder to elbow.  Reported pt. Has had some swelling extend down into the forearm, since he was in the ER.  Advised wife, per Dr. Nicole Cella consult note, the AVG had a Bruit and Thrill .  Wife requested appt. with Dr. Oneida Alar.  Advised will contact [facility] in [place] for a report on status of dialysis access, and current pt. symptoms.  Called [facility], [place]; spoke with Juliann Pulse, RN.  Reported pt's hemodialysis treatment is currently being done without problems.  Reported "pt's left arm looks swollen and a little red, and oozing a thin pink fluid."  Also, stated pt. c/o pain in access site.  Will schedule appt. with Dr. Oneida Alar this Thursday.

## 2014-04-12 NOTE — Telephone Encounter (Signed)
Patients wife is aware, dpm

## 2014-04-13 ENCOUNTER — Ambulatory Visit (INDEPENDENT_AMBULATORY_CARE_PROVIDER_SITE_OTHER): Payer: Medicare Other | Admitting: *Deleted

## 2014-04-13 DIAGNOSIS — Z7901 Long term (current) use of anticoagulants: Secondary | ICD-10-CM

## 2014-04-13 DIAGNOSIS — Z5181 Encounter for therapeutic drug level monitoring: Secondary | ICD-10-CM

## 2014-04-13 DIAGNOSIS — I480 Paroxysmal atrial fibrillation: Secondary | ICD-10-CM

## 2014-04-13 DIAGNOSIS — I4891 Unspecified atrial fibrillation: Secondary | ICD-10-CM

## 2014-04-13 DIAGNOSIS — Z951 Presence of aortocoronary bypass graft: Secondary | ICD-10-CM

## 2014-04-13 LAB — POCT INR: INR: 2.3

## 2014-04-14 ENCOUNTER — Encounter: Payer: Self-pay | Admitting: Family

## 2014-04-15 ENCOUNTER — Ambulatory Visit (INDEPENDENT_AMBULATORY_CARE_PROVIDER_SITE_OTHER): Payer: Self-pay | Admitting: Family

## 2014-04-15 ENCOUNTER — Encounter: Payer: Self-pay | Admitting: Family

## 2014-04-15 ENCOUNTER — Ambulatory Visit (HOSPITAL_COMMUNITY)
Admission: RE | Admit: 2014-04-15 | Discharge: 2014-04-15 | Disposition: A | Discharge status: Home / Self Care | Payer: Medicare Other | Source: Ambulatory Visit | Attending: Family | Admitting: Family

## 2014-04-15 ENCOUNTER — Other Ambulatory Visit (HOSPITAL_COMMUNITY): Payer: Self-pay

## 2014-04-15 ENCOUNTER — Other Ambulatory Visit: Payer: Self-pay

## 2014-04-15 VITALS — BP 144/64 | HR 62 | Temp 96.9°F | Resp 14 | Ht 73.5 in | Wt 201.0 lb

## 2014-04-15 DIAGNOSIS — M7989 Other specified soft tissue disorders: Secondary | ICD-10-CM

## 2014-04-15 DIAGNOSIS — T148XXA Other injury of unspecified body region, initial encounter: Secondary | ICD-10-CM

## 2014-04-15 DIAGNOSIS — N186 End stage renal disease: Secondary | ICD-10-CM

## 2014-04-15 DIAGNOSIS — Z4931 Encounter for adequacy testing for hemodialysis: Secondary | ICD-10-CM

## 2014-04-15 DIAGNOSIS — L24A9 Irritant contact dermatitis due friction or contact with other specified body fluids: Secondary | ICD-10-CM

## 2014-04-15 DIAGNOSIS — M79609 Pain in unspecified limb: Secondary | ICD-10-CM

## 2014-04-15 NOTE — Progress Notes (Signed)
Established Dialysis Access  History of Present Illness  [name] is a 80 y.o. (1934/02/07) male patient of Dr. Oneida Alar who is status post placement of a left upper arm graft on 02/03/2014.  He returns today with c/o infiltrate in left upper arm AVG at HD, in ED on 6/6 with swelling of left upper arm, now has thin pink drainage and pain. He states the graft was working well in HD until a week ago, when the second needle insertion in HD caused some swelling. The swelling and pain have steadily increased.  He reports pain in his left hand, denies tingling in left hand. He has had fever and chills. He dialyzes T-TH-Sat. Noted that he takes coumadin and ASA.  Past Medical History  Diagnosis Date  . Hypertension   . Depression   . 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 Coronary artery disease     a. 05/2013 NSTEMI/Cath: LM 90d, LAD 90ost/77m, LCX 90ost, OM1 90p, RCA 100p;  b. 05/2013 CABG x 4: LIMA->LAD, VG->RI->OM, VG->PDA.  [name] Peripheral arterial disease   . Atrial flutter     a. Dx 05/2013-->dilt/coumadin.  [name] DVT (deep venous thrombosis) 02/2011    LLL  . GERD (gastroesophageal reflux disease)   . Compression fracture of cervical spine   . Rheumatoid arthritis   . Prostate cancer   . Stasis dermatitis     LLL, wears TED hose  . AAA (abdominal aortic aneurysm)   . 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 Chronic systolic CHF (congestive heart failure)     EF 45-55% per echo 05/28/13  . Hyperlipidemia   . Dysphagia     a. after prolonged intubation 05/2013; b. 06/2013 refused EDG/Dilatation.  . Myocardial infarction   . Hypothyroidism   . Stroke     denies  . Constipation   . Dysrhythmia   . ESRD on dialysis 06/2013    Mon and Thurs in [place]   Past Surgical History  Procedure Laterality Date  . Insertion of dialysis catheter N/A 05/26/2013    Procedure: INSERTION OF DIALYSIS CATHETER;  Surgeon: Elam Dutch, MD;  Location: [facility];  Service: Vascular;  Laterality:  N/A;  right IJ  . Cardiac surgery    . Cataract extraction    . Eye surgery Left     cataract  . Av fistula placement Left 07/08/2013    Procedure: INSERTION OF ARTERIOVENOUS (AV) GORE-TEX GRAFT ARM;  Surgeon: Elam Dutch, MD;  Location: [facility];  Service: Vascular;  Laterality: Left;  . Coronary artery bypass graft N/A 05/13/2013    Procedure: CORONARY ARTERY BYPASS GRAFTING (CABG);  Surgeon: Gaye Pollack, MD;  Location: [facility];  Service: Open Heart Surgery;  Laterality: N/A;  . Av fistula placement Left 02/03/2014    Procedure: INSERTION OF ARTERIOVENOUS (AV) GORE-TEX GRAFT USING 4-7 MM X 45 CM STRETCH GORETEX GRAFT;  Surgeon: Elam Dutch, MD;  Location: [facility];  Service: Vascular;  Laterality: Left;   History   Social History  . Marital Status: Married    Spouse Name: N/A    Number of Children: N/A  . Years of Education: N/A   Occupational History  . Not on file.   Social History Main Topics  . Smoking status: Former Smoker -- 1.00 packs/day for 31 years    Quit date: 11/05/2000  . Smokeless tobacco: Never Used  . Alcohol Use: No  Comment: Previously drank 6 beers/day, quit in 1994  . Drug Use: No  . Sexual Activity: Not on file   Other Topics Concern  . Not on file   Social History Narrative   Lives in [place] with wife.   Family History  Problem Relation Age of Onset  . Heart attack Mother     deceased @ 57  . Kidney failure Father     deceased @ 31  . Heart attack Son     deceased @ 55   Current Outpatient Prescriptions on File Prior to Visit  Medication Sig Dispense Refill  . acetaminophen (TYLENOL) 325 MG tablet Take 1-2 tablets (325-650 mg total) by mouth every 6 (six) hours as needed for pain.      [name] amiodarone (PACERONE) 200 MG tablet Take 1 tablet (200 mg total) by mouth daily.  30 tablet  6  . aspirin 81 MG tablet Take 81 mg by mouth daily.      [name] atorvastatin (LIPITOR) 20 MG tablet Take 1 tablet (20 mg total) by mouth daily at 6 PM.  30 tablet  0    . diltiazem (CARDIZEM) 30 MG tablet Take 30 mg by mouth daily.      . folic acid-vitamin b complex-vitamin c-selenium-zinc (DIALYVITE) 3 MG TABS tablet Take 1 tablet by mouth daily.      . furosemide (LASIX) 40 MG tablet Take 90 mg by mouth 2 (two) times daily.      . hydrOXYzine (ATARAX/VISTARIL) 25 MG tablet Take 1 tablet (25 mg total) by mouth 3 (three) times daily as needed for itching.  90 tablet  0  . levothyroxine (SYNTHROID, LEVOTHROID) 100 MCG tablet Take 1 tablet (100 mcg total) by mouth daily before breakfast.  30 tablet  0  . meclizine (ANTIVERT) 25 MG tablet Take 25 mg by mouth 2 (two) times daily as needed for dizziness.       . Multiple Vitamin (MULTIVITAMIN) tablet Take 1 tablet by mouth daily.      . ondansetron (ZOFRAN) 8 MG tablet Take 4 mg by mouth every 8 (eight) hours as needed for nausea.      . pantoprazole (PROTONIX) 40 MG tablet Take 40 mg by mouth 2 (two) times daily.      [name] warfarin (COUMADIN) 2.5 MG tablet Take 3.75 mg by mouth daily at 6 PM.       . oxyCODONE (ROXICODONE) 5 MG immediate release tablet Take 1 tablet (5 mg total) by mouth every 6 (six) hours as needed for severe pain.  30 tablet  0   No current facility-administered medications on file prior to visit.   Allergies  Allergen Reactions  . 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   PHYSICAL EXAMINATION: General: The patient appears their stated age.   HEENT:  No gross abnormalities Pulmonary: Respirations are non-labored Abdomen: Soft and non-tender. Musculoskeletal: There are no major deformities.   Neurologic: No focal weakness or paresthesias are detected, Skin: There are no ulcer or rashes noted. Psychiatric: The patient has normal affect. Cardiovascular: There is a regular rate and rhythm.   Non-Invasive Vascular Imaging  Left arm Access Duplex  (Date: 04/15/2014):  DIALYSIS GRAFT DUPLEX EVALUATION     INDICATION: Complication of left dialysis access    PREVIOUS INTERVENTION(S): 02-03-2014 - Left upper arm graft placement    DUPLEX EXAM: Technically difficult exam due to the  presence of edema and bruising of the arm    RIGHT  LEFT  Peak Systolic Velocities (cm/s) Ratio  LOCATION Ratio Peak Systolic Velocities (cm/s)    Inflow Artery 319     Inflow Anastomosis 499     Arterial Segment Graft 547     Mid Graft 208     Venous Segment Graft 281     Outflow Anastomosis 590 2.1    Outflow Vein 214     Vein       ADDITIONAL FINDINGS:     IMPRESSION: 1.  Patent left brachial-axillary arteriovenous dialysis graft. 2. Large inhomogeneous mass between the left brachial artery and the arteriovenous graft.  No flow is noted within the mass, which most likely represents a hematoma.   3. Pulsatility of the left axillary vein at the graft anastomosis with velocities more slightly elevated through this segment, with a ratio of 2.1. It is technically difficult to evaluate this area due to the presence of the large mass.   Extrinsic compression of the graft may be causing the elevation of the velocities. 4. Mild extrinsic compression for the graft in the distal upper arm.        Medical Decision Making  [name] is a 80 y.o. male who is status post placement of a left upper arm graft on 02/03/2014.  He returns today with c/o infiltrate in left upper arm AVG at HD, in ED on 6/6 with swelling of left upper arm, now has thin pink drainage and pain. He states the graft was working well in HD until a week ago, when the second needle insertion in HD caused some swelling. The swelling and pain have steadily increased.  He reports pain in his left hand, denies tingling in left hand. He has had fever and chills. He dialyzes T-TH-Sat. Noted that he takes coumadin and ASA.  Dr. Oneida Alar spoke with and examined pt. Left upper arm dialysis access US performed to evaluate for fluid surrounding the  graft: large hematoma. May continue using graft; if arm gets too painful, call office and will schedule to insert temporary access. Otherwise, anticipate hematoma to slowly resolve. Follow up in 10 days with Dr. Oneida Alar. Will advise [facility] to start Vancomycin ASAP in dialysis for the next 10 days.   [name], Sharmon Leyden, RN, MSN, FNP-C [facility] Office: [phone]  04/15/2014, 9:12 AM  Clinic MD: Oneida Alar

## 2014-04-20 ENCOUNTER — Ambulatory Visit (INDEPENDENT_AMBULATORY_CARE_PROVIDER_SITE_OTHER): Payer: Medicare Other | Admitting: *Deleted

## 2014-04-20 DIAGNOSIS — I4891 Unspecified atrial fibrillation: Secondary | ICD-10-CM

## 2014-04-20 DIAGNOSIS — Z951 Presence of aortocoronary bypass graft: Secondary | ICD-10-CM

## 2014-04-20 DIAGNOSIS — I480 Paroxysmal atrial fibrillation: Secondary | ICD-10-CM

## 2014-04-20 DIAGNOSIS — Z5181 Encounter for therapeutic drug level monitoring: Secondary | ICD-10-CM

## 2014-04-20 DIAGNOSIS — Z7901 Long term (current) use of anticoagulants: Secondary | ICD-10-CM

## 2014-04-20 LAB — POCT INR: INR: 3.7

## 2014-04-26 ENCOUNTER — Ambulatory Visit: Payer: Self-pay | Admitting: Cardiology

## 2014-04-27 ENCOUNTER — Encounter: Payer: Self-pay | Admitting: Cardiology

## 2014-04-27 ENCOUNTER — Ambulatory Visit: Payer: Self-pay | Admitting: *Deleted

## 2014-04-27 ENCOUNTER — Ambulatory Visit (INDEPENDENT_AMBULATORY_CARE_PROVIDER_SITE_OTHER): Payer: Medicare Other | Admitting: Cardiology

## 2014-04-27 VITALS — BP 128/62 | HR 71 | Ht 73.0 in | Wt 199.0 lb

## 2014-04-27 DIAGNOSIS — N186 End stage renal disease: Secondary | ICD-10-CM

## 2014-04-27 DIAGNOSIS — I4891 Unspecified atrial fibrillation: Secondary | ICD-10-CM

## 2014-04-27 DIAGNOSIS — I2510 Atherosclerotic heart disease of native coronary artery without angina pectoris: Secondary | ICD-10-CM

## 2014-04-27 DIAGNOSIS — Z5181 Encounter for therapeutic drug level monitoring: Secondary | ICD-10-CM

## 2014-04-27 DIAGNOSIS — Z951 Presence of aortocoronary bypass graft: Secondary | ICD-10-CM

## 2014-04-27 DIAGNOSIS — I10 Essential (primary) hypertension: Secondary | ICD-10-CM

## 2014-04-27 DIAGNOSIS — I480 Paroxysmal atrial fibrillation: Secondary | ICD-10-CM

## 2014-04-27 MED ORDER — DILTIAZEM HCL 30 MG PO TABS: 30.0000 mg | ORAL_TABLET | Freq: Two times a day (BID) | ORAL | Status: DC

## 2014-04-27 MED ORDER — ASPIRIN EC 325 MG PO TBEC: 325.0000 mg | DELAYED_RELEASE_TABLET | Freq: Every day | ORAL | Status: AC

## 2014-04-27 NOTE — Patient Instructions (Signed)
   Stop Warfarin (Coumadin)  Increase Aspirin to 325mg  daily Continue all other current medications. Your physician wants you to follow up in: 6 months.  You will receive a reminder letter in the mail one-two months in advance.  If you don't receive a letter, please call our office to schedule the follow up appointment

## 2014-04-27 NOTE — Progress Notes (Signed)
Clinical Summary Mr. Kingsley is a 80 y.o.male seen today for follow up of the following medical problems.  1. CAD  - recent CABG 05/13/13 4 vessel: LIMA to LAD, SVG to OM, SVG to PDA, SVG to ramus. Presented w/ NSTEMI.  - denies any chest pain. Denies any SOB or DOE. No orthopnea, no PND. Denies any pain along incision site.  - compliant w/ meds   2. HTN  - previous problems with low blood pressures and dizziness, often associated with HD  - symptoms improved with decreased fluid removal during HD sessions, we have also decreased his diltiazem over the last few visits.   3. Afib  - denies any recent palpitations  - on coumadin, he is followed in our coumadin clinic.  - denies any troubles w/ bleeding   4. Hyperlipidemia  - 05/2013 TC 172 TG 140 HDL 47 LDL 97. Appears he was on zocor around that time, switched to lipitor  - denies any significant side effects.   5. ESRD - compliant with HD sessions  Past Medical History  Diagnosis Date  . Hypertension   . Depression   . 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 Coronary artery disease     a. 05/2013 NSTEMI/Cath: LM 90d, LAD 90ost/55m, LCX 90ost, OM1 90p, RCA 100p;  b. 05/2013 CABG x 4: LIMA->LAD, VG->RI->OM, VG->PDA.  [name] Peripheral arterial disease   . Atrial flutter     a. Dx 05/2013-->dilt/coumadin.  [name] DVT (deep venous thrombosis) 02/2011    LLL  . GERD (gastroesophageal reflux disease)   . Compression fracture of cervical spine   . Rheumatoid arthritis   . Prostate cancer   . Stasis dermatitis     LLL, wears TED hose  . AAA (abdominal aortic aneurysm)   . 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 Chronic systolic CHF (congestive heart failure)     EF 45-55% per echo 05/28/13  . Hyperlipidemia   . Dysphagia     a. after prolonged intubation 05/2013; b. 06/2013 refused EDG/Dilatation.  . Myocardial infarction   . Hypothyroidism   . Stroke     denies  . Constipation   . Dysrhythmia   . ESRD on dialysis 06/2013   Mon and Thurs in [place]     Allergies  Allergen Reactions  . 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 acetaminophen (TYLENOL) 325 MG tablet Take 1-2 tablets (325-650 mg total) by mouth every 6 (six) hours as needed for pain.      [name] amiodarone (PACERONE) 200 MG tablet Take 1 tablet (200 mg total) by mouth daily.  30 tablet  6  . aspirin 81 MG tablet Take 81 mg by mouth daily.      [name] atorvastatin (LIPITOR) 20 MG tablet Take 1 tablet (20 mg total) by mouth daily at 6 PM.  30 tablet  0  . diltiazem (CARDIZEM) 30 MG tablet Take 30 mg by mouth daily.      . folic acid-vitamin b complex-vitamin c-selenium-zinc (DIALYVITE) 3 MG TABS tablet Take 1 tablet by mouth daily.      . furosemide (LASIX) 40 MG tablet Take 90 mg by mouth 2 (two) times daily.      . hydrOXYzine (ATARAX/VISTARIL) 25 MG tablet Take 1 tablet (25 mg total) by mouth 3 (three) times daily as needed  for itching.  90 tablet  0  . levothyroxine (SYNTHROID, LEVOTHROID) 100 MCG tablet Take 1 tablet (100 mcg total) by mouth daily before breakfast.  30 tablet  0  . meclizine (ANTIVERT) 25 MG tablet Take 25 mg by mouth 2 (two) times daily as needed for dizziness.       . Multiple Vitamin (MULTIVITAMIN) tablet Take 1 tablet by mouth daily.      . ondansetron (ZOFRAN) 8 MG tablet Take 4 mg by mouth every 8 (eight) hours as needed for nausea.      [name] oxyCODONE (ROXICODONE) 5 MG immediate release tablet Take 1 tablet (5 mg total) by mouth every 6 (six) hours as needed for severe pain.  30 tablet  0  . pantoprazole (PROTONIX) 40 MG tablet Take 40 mg by mouth 2 (two) times daily.      [name] warfarin (COUMADIN) 2.5 MG tablet Take 3.75 mg by mouth daily at 6 PM.        No current facility-administered medications for this visit.     Past Surgical History  Procedure Laterality Date  . Insertion of dialysis catheter N/A 05/26/2013    Procedure: INSERTION OF  DIALYSIS CATHETER;  Surgeon: Elam Dutch, MD;  Location: [facility];  Service: Vascular;  Laterality: N/A;  right IJ  . Cardiac surgery    . Cataract extraction    . Eye surgery Left     cataract  . Av fistula placement Left 07/08/2013    Procedure: INSERTION OF ARTERIOVENOUS (AV) GORE-TEX GRAFT ARM;  Surgeon: Elam Dutch, MD;  Location: [facility];  Service: Vascular;  Laterality: Left;  . Coronary artery bypass graft N/A 05/13/2013    Procedure: CORONARY ARTERY BYPASS GRAFTING (CABG);  Surgeon: Gaye Pollack, MD;  Location: [facility];  Service: Open Heart Surgery;  Laterality: N/A;  . Av fistula placement Left 02/03/2014    Procedure: INSERTION OF ARTERIOVENOUS (AV) GORE-TEX GRAFT USING 4-7 MM X 45 CM STRETCH GORETEX GRAFT;  Surgeon: Elam Dutch, MD;  Location: [facility];  Service: Vascular;  Laterality: Left;     Allergies  Allergen Reactions  . 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 Heart attack Mother     deceased @ 50  . Kidney failure Father     deceased @ 89  . Heart attack Son     deceased @ 100     Social History Mr. Harland reports that he quit smoking about 13 years ago. He has never used smokeless tobacco. Mr. Locy reports that he does not drink alcohol.   Review of Systems CONSTITUTIONAL: No weight loss, fever, chills, weakness or fatigue.  HEENT: Eyes: No visual loss, blurred vision, double vision or yellow sclerae.No hearing loss, sneezing, congestion, runny nose or sore throat.  SKIN: No rash or itching.  CARDIOVASCULAR: per HPI RESPIRATORY: No shortness of breath, cough or sputum.  GASTROINTESTINAL: No anorexia, nausea, vomiting or diarrhea. No abdominal pain or blood.  GENITOURINARY: No burning on urination, no polyuria NEUROLOGICAL: No headache, dizziness, syncope, paralysis, ataxia, numbness or tingling in the extremities. No change in bowel or bladder control.  MUSCULOSKELETAL: No  muscle, back pain, joint pain or stiffness.  LYMPHATICS: No enlarged nodes. No history of splenectomy.  PSYCHIATRIC: No history of depression or anxiety.  ENDOCRINOLOGIC: No reports of sweating, cold or heat intolerance. No polyuria or polydipsia.  [name]  Physical Examination p 71 bp 128/62 Wt 199 lbs BMI 26 Gen: resting comfortably, no acute distress HEENT: no scleral icterus, pupils equal round and reactive, no palptable cervical adenopathy,  CV: RRR, no m/r/g, no JVD, no carotid bruits Resp: Clear to auscultation bilaterally GI: abdomen is soft, non-tender, non-distended, normal bowel sounds, no hepatosplenomegaly MSK: extremities are warm, no edema.  Skin: warm, no rash Neuro:  no focal deficits Psych: appropriate affect   Diagnostic Studies 05/2013 Echo  - LVEF 45-50%, hypokinetic septal and mid and basal inferior walls. Mild LAE   08/28/13 Abdominal xray  Normal bowel gas pattern   08/28/13 Labs Na 136 K 4.9 AST 44 ALT 38 A phos 90 T.bili 0.8 TSH 2.92 Lipase 28      Assessment and Plan  1. CAD  - no current symptoms  - continue current medications   2. HTN  - recent problems with hypotension, has improved since cutting back on diltiazem and the amount of fluid removed during HD was decreased.  - continue to follow   3. Afib  - denies any current symptoms  - continue current meds. On amiodarone recent normal LFTs and thyroid studies - wants to stop coumadin, discussed in detail the greater benefits of coumadin compared to aspirin for stroke prevention. He understands the increased risk of stroke on ASA along but still wishes to stop coumadin, will discontinue  - CHADS2-Vasc score is 4, 4% risk of stroke per year.    Fu 6 months  Arnoldo Lenis, M.D.

## 2014-04-28 ENCOUNTER — Encounter: Payer: Self-pay | Admitting: Vascular Surgery

## 2014-04-29 ENCOUNTER — Encounter: Payer: Self-pay | Admitting: Vascular Surgery

## 2014-04-29 ENCOUNTER — Ambulatory Visit (HOSPITAL_COMMUNITY)
Admission: RE | Admit: 2014-04-29 | Discharge: 2014-04-29 | Disposition: A | Discharge status: Home / Self Care | Payer: Medicare Other | Source: Ambulatory Visit | Attending: Vascular Surgery | Admitting: Vascular Surgery

## 2014-04-29 ENCOUNTER — Ambulatory Visit (INDEPENDENT_AMBULATORY_CARE_PROVIDER_SITE_OTHER): Payer: Medicare Other | Admitting: Vascular Surgery

## 2014-04-29 VITALS — BP 155/69 | HR 65 | Resp 18 | Ht 72.0 in | Wt 199.0 lb

## 2014-04-29 DIAGNOSIS — N186 End stage renal disease: Secondary | ICD-10-CM

## 2014-04-29 DIAGNOSIS — Z4931 Encounter for adequacy testing for hemodialysis: Secondary | ICD-10-CM

## 2014-04-29 NOTE — Progress Notes (Signed)
Patient is a 80 year old male who had infiltration of his left upper arm graft in early June. He returns today for further followup. He states that the swelling is significantly improved. He states they are using the graft for dialysis at this point.   Current Outpatient Prescriptions on File Prior to Visit  Medication Sig Dispense Refill  . amiodarone (PACERONE) 200 MG tablet Take 1 tablet (200 mg total) by mouth daily.  30 tablet  6  . aspirin EC 325 MG tablet Take 1 tablet (325 mg total) by mouth daily.      [name] atorvastatin (LIPITOR) 20 MG tablet Take 1 tablet (20 mg total) by mouth daily at 6 PM.  30 tablet  0  . diltiazem (CARDIZEM) 30 MG tablet Take 1 tablet (30 mg total) by mouth 2 (two) times daily.      . furosemide (LASIX) 40 MG tablet Take 90 mg by mouth 2 (two) times daily.      . hydrOXYzine (ATARAX/VISTARIL) 25 MG tablet Take 1 tablet (25 mg total) by mouth 3 (three) times daily as needed for itching.  90 tablet  0  . levothyroxine (SYNTHROID, LEVOTHROID) 100 MCG tablet Take 1 tablet (100 mcg total) by mouth daily before breakfast.  30 tablet  0  . meclizine (ANTIVERT) 25 MG tablet Take 25 mg by mouth 2 (two) times daily as needed for dizziness.       . ondansetron (ZOFRAN) 8 MG tablet Take 4 mg by mouth every 8 (eight) hours as needed for nausea.      . pantoprazole (PROTONIX) 40 MG tablet Take 40 mg by mouth 2 (two) times daily.      [name] acetaminophen (TYLENOL) 325 MG tablet Take 1-2 tablets (325-650 mg total) by mouth every 6 (six) hours as needed for pain.      . folic acid-vitamin b complex-vitamin c-selenium-zinc (DIALYVITE) 3 MG TABS tablet Take 1 tablet by mouth daily.       No current facility-administered medications on file prior to visit.    Physical exam:  Filed Vitals:   04/29/14 1217  BP: 155/69  Pulse: 65  Resp: 18  Height: 6' (1.829 m)  Weight: 199 lb (90.266 kg)    Left upper arm AV graft. Erythema has completely resolved at this point. There is still a  hematoma in the mid section of the graft but overall the graft is fairly palpable throughout its course.   Data: Duplex ultrasound today shows a 7 x 3 cm hematoma the mid graft was some graft compression from hematoma.  I reviewed and interpreted this study.  Assessment: Improved left upper arm AV graft status post infiltration  Plan: Followup when necessary  Ruta Hinds, MD [facility] Office: [phone] Pager: [phone]

## 2014-05-25 ENCOUNTER — Other Ambulatory Visit: Payer: Self-pay | Admitting: *Deleted

## 2014-05-25 MED ORDER — DILTIAZEM HCL 30 MG PO TABS: 30.0000 mg | ORAL_TABLET | Freq: Two times a day (BID) | ORAL | Status: DC

## 2014-05-26 ENCOUNTER — Encounter: Payer: Self-pay | Admitting: Vascular Surgery

## 2014-05-26 ENCOUNTER — Ambulatory Visit (HOSPITAL_COMMUNITY)
Admission: RE | Admit: 2014-05-26 | Discharge: 2014-05-26 | Disposition: A | Discharge status: Home / Self Care | Payer: Medicare Other | Source: Ambulatory Visit | Attending: Vascular Surgery | Admitting: Vascular Surgery

## 2014-05-26 DIAGNOSIS — N186 End stage renal disease: Secondary | ICD-10-CM | POA: Diagnosis not present

## 2014-05-26 DIAGNOSIS — Z4931 Encounter for adequacy testing for hemodialysis: Secondary | ICD-10-CM

## 2014-05-27 ENCOUNTER — Encounter: Payer: Self-pay | Admitting: Vascular Surgery

## 2014-05-27 ENCOUNTER — Ambulatory Visit (INDEPENDENT_AMBULATORY_CARE_PROVIDER_SITE_OTHER): Payer: Medicare Other | Admitting: Vascular Surgery

## 2014-05-27 VITALS — BP 140/60 | HR 62 | Resp 18 | Ht 72.0 in | Wt 195.0 lb

## 2014-05-27 DIAGNOSIS — N186 End stage renal disease: Secondary | ICD-10-CM

## 2014-05-27 NOTE — Progress Notes (Signed)
Patient is a 80 year old male who had infiltration of his left upper arm graft in early June. He returns today for further followup. He states that the swelling is significantly improved. He states they are using the graft for dialysis at this point.     Current Outpatient Prescriptions on File Prior to Visit   Medication  Sig  Dispense  Refill   .  amiodarone (PACERONE) 200 MG tablet  Take 1 tablet (200 mg total) by mouth daily.   30 tablet   6   .  aspirin EC 325 MG tablet  Take 1 tablet (325 mg total) by mouth daily.         [name]  atorvastatin (LIPITOR) 20 MG tablet  Take 1 tablet (20 mg total) by mouth daily at 6 PM.   30 tablet   0   .  diltiazem (CARDIZEM) 30 MG tablet  Take 1 tablet (30 mg total) by mouth 2 (two) times daily.         .  furosemide (LASIX) 40 MG tablet  Take 90 mg by mouth 2 (two) times daily.         .  hydrOXYzine (ATARAX/VISTARIL) 25 MG tablet  Take 1 tablet (25 mg total) by mouth 3 (three) times daily as needed for itching.   90 tablet   0   .  levothyroxine (SYNTHROID, LEVOTHROID) 100 MCG tablet  Take 1 tablet (100 mcg total) by mouth daily before breakfast.   30 tablet   0   .  meclizine (ANTIVERT) 25 MG tablet  Take 25 mg by mouth 2 (two) times daily as needed for dizziness.          .  ondansetron (ZOFRAN) 8 MG tablet  Take 4 mg by mouth every 8 (eight) hours as needed for nausea.         .  pantoprazole (PROTONIX) 40 MG tablet  Take 40 mg by mouth 2 (two) times daily.         [name]  acetaminophen (TYLENOL) 325 MG tablet  Take 1-2 tablets (325-650 mg total) by mouth every 6 (six) hours as needed for pain.         .  folic acid-vitamin b complex-vitamin c-selenium-zinc (DIALYVITE) 3 MG TABS tablet  Take 1 tablet by mouth daily.            No current facility-administered medications on file prior to visit.     Physical exam:    Filed Vitals:   05/27/14 1353  BP: 140/60  Pulse: 62  Resp: 18  Height: 6' (1.829 m)  Weight: 195 lb (88.451 kg)    Left upper arm AV  graft. Erythema has completely resolved at this point. There is still a hematoma in the mid section of the graft but smaller overall the graft is palpable throughout its course.   Data: Duplex ultrasound today shows a 5 x 2 decreased from 7 x 3 cm hematoma the mid graft.  I reviewed and interpreted this study.  Assessment: Improved left upper arm AV graft status post infiltration  Plan: Followup when necessary  Ruta Hinds, MD [facility] Office: [phone] Pager: [phone]

## 2014-06-09 ENCOUNTER — Other Ambulatory Visit (HOSPITAL_COMMUNITY): Payer: Medicare Other

## 2014-06-10 ENCOUNTER — Ambulatory Visit: Payer: Medicare Other | Admitting: Vascular Surgery

## 2014-07-07 ENCOUNTER — Other Ambulatory Visit: Payer: Self-pay | Admitting: *Deleted

## 2014-07-07 MED ORDER — AMIODARONE HCL 200 MG PO TABS: 200.0000 mg | ORAL_TABLET | Freq: Every day | ORAL | Status: DC

## 2014-10-14 ENCOUNTER — Encounter (HOSPITAL_COMMUNITY): Payer: Self-pay | Admitting: Cardiovascular Disease

## 2014-10-20 ENCOUNTER — Ambulatory Visit (INDEPENDENT_AMBULATORY_CARE_PROVIDER_SITE_OTHER): Payer: Medicare Other | Admitting: Cardiology

## 2014-10-20 ENCOUNTER — Encounter: Payer: Self-pay | Admitting: Cardiology

## 2014-10-20 VITALS — BP 126/53 | HR 59 | Ht 73.0 in | Wt 196.0 lb

## 2014-10-20 DIAGNOSIS — I4891 Unspecified atrial fibrillation: Secondary | ICD-10-CM

## 2014-10-20 DIAGNOSIS — I10 Essential (primary) hypertension: Secondary | ICD-10-CM

## 2014-10-20 DIAGNOSIS — E785 Hyperlipidemia, unspecified: Secondary | ICD-10-CM

## 2014-10-20 DIAGNOSIS — I2510 Atherosclerotic heart disease of native coronary artery without angina pectoris: Secondary | ICD-10-CM

## 2014-10-20 MED ORDER — ATORVASTATIN CALCIUM 80 MG PO TABS: 80.0000 mg | ORAL_TABLET | Freq: Every day | ORAL | Status: AC

## 2014-10-20 NOTE — Progress Notes (Signed)
Clinical Summary Mr. Litzinger is a 80 y.o.male seen today for follow up of the following medical problems.   1. CAD  - recent CABG 05/13/13 4 vessel: LIMA to LAD, SVG to OM, SVG to PDA, SVG to ramus. Presented w/ NSTEMI at that time.  - 05/2013 echo LVEF 45-50% - since last visit denies any chest pain, SOB, or DOE.   - compliant w/ meds   2. HTN  - previous problems with low blood pressures and dizziness, often associated with HD  - symptoms improved with decreased fluid removal during HD sessions, we have also decreased his diltiazem over the last few visits.  - reports bp's have been stable since last visit  3. Afib  - denies any recent palpitations  - he had previously been on coumadin but requested to stop and change to ASA - 04/2014 normal LFTs Thyroid normal 01/2014, he is on amiodarone.   4. Hyperlipidemia  01/2014 TC 132 TG 101 HDL 50 LDL 62 - compliant with statin  5. ESRD - compliant with HD sessions    Past Medical History  Diagnosis Date  . Hypertension   . Depression   . 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 Coronary artery disease     a. 05/2013 NSTEMI/Cath: LM 90d, LAD 90ost/66m, LCX 90ost, OM1 90p, RCA 100p;  b. 05/2013 CABG x 4: LIMA->LAD, VG->RI->OM, VG->PDA.  [name] Peripheral arterial disease   . Atrial flutter     a. Dx 05/2013-->dilt/coumadin.  [name] DVT (deep venous thrombosis) 02/2011    LLL  . GERD (gastroesophageal reflux disease)   . Compression fracture of cervical spine   . Rheumatoid arthritis   . Prostate cancer   . Stasis dermatitis     LLL, wears TED hose  . AAA (abdominal aortic aneurysm)   . 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 Chronic systolic CHF (congestive heart failure)     EF 45-55% per echo 05/28/13  . Hyperlipidemia   . Dysphagia     a. after prolonged intubation 05/2013; b. 06/2013 refused EDG/Dilatation.  . Myocardial infarction   . Hypothyroidism   . Stroke     denies  . Constipation   . Dysrhythmia   . ESRD on  dialysis 06/2013    Mon and Thurs in [place]     Allergies  Allergen Reactions  . 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 acetaminophen (TYLENOL) 325 MG tablet Take 1-2 tablets (325-650 mg total) by mouth every 6 (six) hours as needed for pain.    [name] amiodarone (PACERONE) 200 MG tablet Take 1 tablet (200 mg total) by mouth daily. 30 tablet 6  . aspirin EC 325 MG tablet Take 1 tablet (325 mg total) by mouth daily.    [name] atorvastatin (LIPITOR) 20 MG tablet Take 1 tablet (20 mg total) by mouth daily at 6 PM. 30 tablet 0  . diltiazem (CARDIZEM) 30 MG tablet Take 1 tablet (30 mg total) by mouth 2 (two) times daily. 60 tablet 6  . folic acid-vitamin b complex-vitamin c-selenium-zinc (DIALYVITE) 3 MG TABS tablet Take 1 tablet by mouth daily.    . furosemide (LASIX) 40 MG tablet Take 90 mg by mouth 2 (two) times daily.    . hydrOXYzine (ATARAX/VISTARIL) 25 MG tablet Take 1 tablet (25 mg total) by mouth 3 (three)  times daily as needed for itching. 90 tablet 0  . levothyroxine (SYNTHROID, LEVOTHROID) 100 MCG tablet Take 1 tablet (100 mcg total) by mouth daily before breakfast. 30 tablet 0  . meclizine (ANTIVERT) 25 MG tablet Take 25 mg by mouth 2 (two) times daily as needed for dizziness.     . ondansetron (ZOFRAN) 8 MG tablet Take 4 mg by mouth every 8 (eight) hours as needed for nausea.    . pantoprazole (PROTONIX) 40 MG tablet Take 40 mg by mouth 2 (two) times daily.     No current facility-administered medications for this visit.     Past Surgical History  Procedure Laterality Date  . Insertion of dialysis catheter N/A 05/26/2013    Procedure: INSERTION OF DIALYSIS CATHETER;  Surgeon: Elam Dutch, MD;  Location: [facility];  Service: Vascular;  Laterality: N/A;  right IJ  . Cardiac surgery    . Cataract extraction    . Eye surgery Left     cataract  . Av fistula placement Left 07/08/2013    Procedure:  INSERTION OF ARTERIOVENOUS (AV) GORE-TEX GRAFT ARM;  Surgeon: Elam Dutch, MD;  Location: [place];  Service: Vascular;  Laterality: Left;  . Coronary artery bypass graft N/A 05/13/2013    Procedure: CORONARY ARTERY BYPASS GRAFTING (CABG);  Surgeon: Gaye Pollack, MD;  Location: [facility];  Service: Open Heart Surgery;  Laterality: N/A;  . Av fistula placement Left 02/03/2014    Procedure: INSERTION OF ARTERIOVENOUS (AV) GORE-TEX GRAFT USING 4-7 MM X 45 CM STRETCH GORETEX GRAFT;  Surgeon: Elam Dutch, MD;  Location: [facility];  Service: Vascular;  Laterality: Left;  . Left heart catheterization with coronary angiogram N/A 05/13/2013    Procedure: LEFT HEART CATHETERIZATION WITH CORONARY ANGIOGRAM;  Surgeon: Burnell Blanks, MD;  Location[facility] CATH LAB;  Service: Cardiovascular;  Laterality: N/A;     Allergies  Allergen Reactions  . 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 Heart attack Mother     deceased @ 7  . Kidney failure Father     deceased @ 51  . Heart attack Son     deceased @ 42     Social History Mr. Fjeld reports that he quit smoking about 13 years ago. He has never used smokeless tobacco. Mr. Brinkmeier reports that he does not drink alcohol.   Review of Systems CONSTITUTIONAL: No weight loss, fever, chills, weakness or fatigue.  HEENT: Eyes: No visual loss, blurred vision, double vision or yellow sclerae.No hearing loss, sneezing, congestion, runny nose or sore throat.  SKIN: No rash or itching.  CARDIOVASCULAR: per HPI RESPIRATORY: No shortness of breath, cough or sputum.  GASTROINTESTINAL: No anorexia, nausea, vomiting or diarrhea. No abdominal pain or blood.  GENITOURINARY: No burning on urination, no polyuria NEUROLOGICAL: No headache, dizziness, syncope, paralysis, ataxia, numbness or tingling in the extremities. No change in bowel or bladder control.  MUSCULOSKELETAL: No muscle, back  pain, joint pain or stiffness.  LYMPHATICS: No enlarged nodes. No history of splenectomy.  PSYCHIATRIC: No history of depression or anxiety.  ENDOCRINOLOGIC: No reports of sweating, cold or heat intolerance. No polyuria or polydipsia.  [name]   Physical Examination p 59 bp 126/53 Wt 196 lbs BMI 26 Gen: resting comfortably, no acute distress HEENT: no scleral icterus, pupils equal round and reactive, no palptable cervical adenopathy,  CV: RRR, no m/r/g, no  JVD, no carotid bruits Resp: Clear to auscultation bilaterally GI: abdomen is soft, non-tender, non-distended, normal bowel sounds, no hepatosplenomegaly MSK: extremities are warm, no edema.  Skin: warm, no rash Neuro:  no focal deficits Psych: appropriate affect   Diagnostic Studies 05/2013 Echo  - LVEF 45-50%, hypokinetic septal and mid and basal inferior walls. Mild LAE   08/28/13 Abdominal xray  Normal bowel gas pattern   08/28/13 Labs Na 136 K 4.9 AST 44 ALT 38 A phos 90 T.bili 0.8 TSH 2.92 Lipase 28      Assessment and Plan  1. CAD  - no current symptoms  - continue current medications   2. HTN  - recent problems with hypotension, has improved since cutting back on diltiazem and the amount of fluid removed during HD was decreased.  - continue to follow   3. Afib  - denies any current symptoms  - continue current meds. On amiodarone recent normal LFTs and thyroid studies - he requested to stop coumadin and change to ASA in the past, we discussed the increased of stroke in this change however he understands risks and has not changed his decision. Continue ASA   4. Hyperlipidemia - based on history of CAD, based on most recent lipid guidelines should be on high dose statin. Increase atorva to 80mg  daily.    F/u 6 months  Arnoldo Lenis, M.D.

## 2014-10-20 NOTE — Patient Instructions (Signed)
   Increase Lipitor to 80mg  daily - new sent to pharm Continue all other medications.   Your physician wants you to follow up in: 6 months.  You will receive a reminder letter in the mail one-two months in advance.  If you don't receive a letter, please call our office to schedule the follow up appointment

## 2014-12-02 ENCOUNTER — Other Ambulatory Visit: Payer: Self-pay

## 2014-12-02 DIAGNOSIS — I83892 Varicose veins of left lower extremities with other complications: Secondary | ICD-10-CM

## 2014-12-13 ENCOUNTER — Encounter: Payer: Self-pay | Admitting: Vascular Surgery

## 2014-12-15 ENCOUNTER — Ambulatory Visit (INDEPENDENT_AMBULATORY_CARE_PROVIDER_SITE_OTHER)
Admission: RE | Admit: 2014-12-15 | Discharge: 2014-12-15 | Disposition: A | Discharge status: Home / Self Care | Payer: Medicare Other | Source: Ambulatory Visit | Attending: Vascular Surgery | Admitting: Vascular Surgery

## 2014-12-15 ENCOUNTER — Ambulatory Visit (INDEPENDENT_AMBULATORY_CARE_PROVIDER_SITE_OTHER): Payer: Medicare Other | Admitting: Vascular Surgery

## 2014-12-15 ENCOUNTER — Encounter: Payer: Self-pay | Admitting: Vascular Surgery

## 2014-12-15 ENCOUNTER — Ambulatory Visit (HOSPITAL_COMMUNITY)
Admission: RE | Admit: 2014-12-15 | Discharge: 2014-12-15 | Disposition: A | Discharge status: Home / Self Care | Payer: Medicare Other | Source: Ambulatory Visit | Attending: Vascular Surgery | Admitting: Vascular Surgery

## 2014-12-15 VITALS — BP 149/64 | HR 68 | Resp 14 | Ht 73.0 in | Wt 199.0 lb

## 2014-12-15 DIAGNOSIS — Z789 Other specified health status: Secondary | ICD-10-CM | POA: Insufficient documentation

## 2014-12-15 DIAGNOSIS — I87009 Postthrombotic syndrome without complications of unspecified extremity: Secondary | ICD-10-CM | POA: Diagnosis not present

## 2014-12-15 DIAGNOSIS — I83892 Varicose veins of left lower extremities with other complications: Secondary | ICD-10-CM

## 2014-12-15 DIAGNOSIS — I872 Venous insufficiency (chronic) (peripheral): Secondary | ICD-10-CM | POA: Insufficient documentation

## 2014-12-15 NOTE — Progress Notes (Signed)
Referred by:  Glenda Chroman, MD [place], [place] [id number]  Reason for referral: Swollen left leg  History of Present Illness  [name] is a 81 y.o. (19-May-1934) male who presents with chief complaint: left swollen leg.  Patient notes, onset of swelling years ago, associated with no obvious trigger.  The patient doesn't remember when he developed the L leg DVT. The patient's symptoms include: left leg swelling with extended standing, serous drainage, and skin breakdown of lower leg.  The patient has had known history of DVT, known history of varicose vein, no history of venous stasis ulcers, no history of  Lymphedema and known history of skin changes in lower legs.  There is no family history of venous disorders.  The patient has never used compression stockings in the past.  The patient has had both GSV harvest for his CABG in 2014.  Past Medical History  Diagnosis Date  . Hypertension   . Depression   . 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 Coronary artery disease     a. 05/2013 NSTEMI/Cath: LM 90d, LAD 90ost/6m, LCX 90ost, OM1 90p, RCA 100p;  b. 05/2013 CABG x 4: LIMA->LAD, VG->RI->OM, VG->PDA.  [name] Peripheral arterial disease   . Atrial flutter     a. Dx 05/2013-->dilt/coumadin.  [name] DVT (deep venous thrombosis) 02/2011    LLL  . GERD (gastroesophageal reflux disease)   . Compression fracture of cervical spine   . Rheumatoid arthritis   . Prostate cancer   . Stasis dermatitis     LLL, wears TED hose  . AAA (abdominal aortic aneurysm)   . 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 Chronic systolic CHF (congestive heart failure)     EF 45-55% per echo 05/28/13  . Hyperlipidemia   . Dysphagia     a. after prolonged intubation 05/2013; b. 06/2013 refused EDG/Dilatation.  . Myocardial infarction   . Hypothyroidism   . Stroke     denies  . Constipation   . Dysrhythmia   . ESRD on dialysis 06/2013    Mon and Thurs in [place]  . Varicose veins     Past Surgical  History  Procedure Laterality Date  . Insertion of dialysis catheter N/A 05/26/2013    Procedure: INSERTION OF DIALYSIS CATHETER;  Surgeon: Elam Dutch, MD;  Location: [facility];  Service: Vascular;  Laterality: N/A;  right IJ  . Cardiac surgery    . Cataract extraction    . Eye surgery Left     cataract  . Av fistula placement Left 07/08/2013    Procedure: INSERTION OF ARTERIOVENOUS (AV) GORE-TEX GRAFT ARM;  Surgeon: Elam Dutch, MD;  Location: [facility];  Service: Vascular;  Laterality: Left;  . Coronary artery bypass graft N/A 05/13/2013    Procedure: CORONARY ARTERY BYPASS GRAFTING (CABG);  Surgeon: Gaye Pollack, MD;  Location: [facility];  Service: Open Heart Surgery;  Laterality: N/A;  . Av fistula placement Left 02/03/2014    Procedure: INSERTION OF ARTERIOVENOUS (AV) GORE-TEX GRAFT USING 4-7 MM X 45 CM STRETCH GORETEX GRAFT;  Surgeon: Elam Dutch, MD;  Location: [place] [facility];  Service: Vascular;  Laterality: Left;  . Left heart catheterization with coronary angiogram N/A 05/13/2013    Procedure: LEFT HEART CATHETERIZATION WITH CORONARY ANGIOGRAM;  Surgeon: Burnell Blanks, MD;  Location: [facility] CATH LAB;  Service: Cardiovascular;  Laterality: N/A;    History   Social  History  . Marital Status: Married    Spouse Name: N/A  . Number of Children: N/A  . Years of Education: N/A   Occupational History  . Not on file.   Social History Main Topics  . Smoking status: Former Smoker -- 1.00 packs/day for 64 years    Start date: 11/30/1960    Quit date: 11/05/2000  . Smokeless tobacco: Never Used  . Alcohol Use: No     Comment: Previously drank 6 beers/day, quit in 1994  . Drug Use: No  . Sexual Activity: Not on file   Other Topics Concern  . Not on file   Social History Narrative   Lives in [place] with wife.    Family History  Problem Relation Age of Onset  . Heart attack Mother     deceased @ 1  . Kidney failure Father     deceased @ 82  . Heart attack Son     deceased @ 62       Current Outpatient Prescriptions on File Prior to Visit  Medication Sig Dispense Refill  . acetaminophen (TYLENOL) 325 MG tablet Take 1-2 tablets (325-650 mg total) by mouth every 6 (six) hours as needed for pain.    [name] amiodarone (PACERONE) 200 MG tablet Take 1 tablet (200 mg total) by mouth daily. 30 tablet 6  . aspirin EC 325 MG tablet Take 1 tablet (325 mg total) by mouth daily.    [name] atorvastatin (LIPITOR) 80 MG tablet Take 1 tablet (80 mg total) by mouth daily. 30 tablet 6  . diltiazem (CARDIZEM) 30 MG tablet Take 1 tablet (30 mg total) by mouth 2 (two) times daily. 60 tablet 6  . folic acid-vitamin b complex-vitamin c-selenium-zinc (DIALYVITE) 3 MG TABS tablet Take 1 tablet by mouth daily.    . furosemide (LASIX) 40 MG tablet Take 90 mg by mouth 2 (two) times daily.    . hydrOXYzine (ATARAX/VISTARIL) 25 MG tablet Take 1 tablet (25 mg total) by mouth 3 (three) times daily as needed for itching. 90 tablet 0  . levothyroxine (SYNTHROID, LEVOTHROID) 100 MCG tablet Take 1 tablet (100 mcg total) by mouth daily before breakfast. 30 tablet 0  . meclizine (ANTIVERT) 25 MG tablet Take 25 mg by mouth 2 (two) times daily as needed for dizziness.     . ondansetron (ZOFRAN) 8 MG tablet Take 4 mg by mouth every 8 (eight) hours as needed for nausea.    . pantoprazole (PROTONIX) 40 MG tablet Take 40 mg by mouth 2 (two) times daily.     No current facility-administered medications on file prior to visit.    Allergies  Allergen Reactions  . Heparin Other (See Comments)    HIT ab+ but SRA negative    REVIEW OF SYSTEMS:  (Positives checked otherwise negative)  CARDIOVASCULAR:  []  chest pain, []  chest pressure, []  palpitations, []  shortness of breath when laying flat, []  shortness of breath with exertion,  [x]  pain in feet when walking, []  pain in feet when laying flat, [x]  history of blood clot in veins (DVT), []  history of phlebitis, [x]  swelling in legs, []  varicose veins  PULMONARY:  []   productive cough, []  asthma, []  wheezing  NEUROLOGIC:  []  weakness in arms or legs, []  numbness in arms or legs, []  difficulty speaking or slurred speech, []  temporary loss of vision in one eye, []  dizziness  HEMATOLOGIC:  []  bleeding problems, []  problems with blood clotting too easily  MUSCULOSKEL:  []  joint pain, []   joint swelling  GASTROINTEST:  []  vomiting blood, []  blood in stool     GENITOURINARY:  []  burning with urination, []  blood in urine  PSYCHIATRIC:  []  history of major depression  INTEGUMENTARY:  []  rashes, []  ulcers  CONSTITUTIONAL:  []  fever, []  chills   Physical Examination Filed Vitals:   12/15/14 1209  BP: 149/64  Pulse: 68  Resp: 14  Height: 6\' 1"  (1.854 m)  Weight: 199 lb (90.266 kg)   Body mass index is 26.26 kg/(m^2).  General: A&O x 3, WDWN  Head: [place]/AT  Ear/Nose/Throat: Hearing grossly intact, nares w/o erythema or drainage, oropharynx w/o Erythema/Exudate  Eyes: PERRLA, EOMI  Neck: Supple, no nuchal rigidity, no palpable LAD  Pulmonary: Sym exp, good air movt, CTAB, no rales, rhonchi, & wheezing  Cardiac: RRR, Nl S1, S2, no Murmurs, rubs or gallops  Vascular: Vessel Right Left  Radial Faintly Palpable Not Palpable  Brachial FaintlyPalpable Faintly Palpable  Carotid Palpable, without bruit Palpable, without bruit  Aorta Not palpable N/A  Femoral Palpable Palpable  Popliteal Not palpable Not palpable  PT Not Palpable Not Palpable  DP Not Palpable Not Palpable   Gastrointestinal: soft, NTND, -G/R, - HSM, - masses, - CVAT B  Musculoskeletal: M/S 5/5 throughout , Extremities without ischemic changes , LLE LDS, LLE edema 1+, +healed VSU, LUA AVG with good thrill and bruit  Neurologic: CN 2-12 intact , Pain and light touch intact in extremities , Motor exam as listed above  Psychiatric: Judgment intact, Mood & affect appropriate for pt's clinical situation  Dermatologic: See M/S exam for extremity exam, no rashes otherwise  noted  Lymph : No Cervical, Axillary, or Inguinal lymphadenopathy   Non-Invasive Vascular Imaging  LLE Venous Insufficiency Duplex (Date: 12/15/2014):   Chronic DVT: mid-distal SFV and PV  No SVT,   No GSV visualized  +SSV reflux  +deep venous reflux: SFV and PV  ABI (Date: 12/15/2014)  RLE: 1.38, PT tri, AT bi, TBI 0.60  LLE: 1.09, PT barely bi, AT Mono, TBI 0.63    Outside Studies/Documentation 4 pages of outside documents were reviewed including: outpatient PCP chart.  Medical Decision Making  [name] is a 81 y.o. male who presents with: LLE chronic venous insufficiency (C5), Post-phlebitic syndrome LLE, evidence of LLE PAD   Based on the patient's history and examination, I recommend: compressive therapy.  I discussed with the patient the use of her 20-30 mm thigh high compression stockings.  His sx are due to post-phlebitic syndrome and his best chance of avoid long-term sequalae is to continue compression.  He has evidence of medial calcification in his RLE tibial arteries with frank disease as evident by a monophasic signal in L AT.  Regardless, he is asx from this at this point.  He has adequate perfusion to use compressive stockings without sequalae.  Thank you for allowing [place] to participate in this patient's care.  Adele Barthel, MD [facility] Office: [phone] Pager: [phone]  12/15/2014, 1:18 PM

## 2014-12-31 ENCOUNTER — Telehealth: Payer: Self-pay | Admitting: Cardiology

## 2014-12-31 MED ORDER — DILTIAZEM HCL 30 MG PO TABS: 30.0000 mg | ORAL_TABLET | Freq: Two times a day (BID) | ORAL | Status: AC

## 2014-12-31 NOTE — Telephone Encounter (Signed)
PATIENT walked in to clinic requesting following refill to be called into [facility] diltiazem (CARDIZEM) 30 MG tablet

## 2014-12-31 NOTE — Telephone Encounter (Signed)
Done

## 2015-02-01 ENCOUNTER — Other Ambulatory Visit: Payer: Self-pay | Admitting: *Deleted

## 2015-02-01 MED ORDER — AMIODARONE HCL 200 MG PO TABS: 200.0000 mg | ORAL_TABLET | Freq: Every day | ORAL | Status: AC

## 2015-03-17 ENCOUNTER — Encounter: Payer: Medicare Other | Admitting: Vascular Surgery

## 2015-03-22 ENCOUNTER — Encounter: Payer: Self-pay | Admitting: Vascular Surgery

## 2015-03-24 ENCOUNTER — Encounter: Payer: Self-pay | Admitting: Vascular Surgery

## 2015-03-24 ENCOUNTER — Ambulatory Visit (INDEPENDENT_AMBULATORY_CARE_PROVIDER_SITE_OTHER): Payer: Medicare Other | Admitting: Vascular Surgery

## 2015-03-24 VITALS — BP 129/59 | HR 65 | Ht 73.0 in | Wt 190.3 lb

## 2015-03-24 DIAGNOSIS — I714 Abdominal aortic aneurysm, without rupture, unspecified: Secondary | ICD-10-CM

## 2015-03-24 DIAGNOSIS — Z01810 Encounter for preprocedural cardiovascular examination: Secondary | ICD-10-CM | POA: Diagnosis not present

## 2015-03-24 NOTE — Progress Notes (Signed)
[facility] [place] HISTORY AND PHYSICAL   History of Present Illness:  Patient is a 81 y.o. year old male who presents for evaluation of abdominal aortic aneurysm. The patient was noted to have infrarenal abdominal aortic aneurysm of 5.8 cm diameter on noncontrast CT scan recently. The patient denies any recent abdominal or back pain. He has no family history of aneurysms. He is a former smoker but quit in 2002. Other medical problems include end-stage renal disease requiring dialysis on Mondays and Fridays. He dialyzes via a left upper arm AV graft. He also has hypertension, coronary artery disease, history of atrial flutter, lower extremity stasis dermatitis with previous bilateral saphenous vein harvesting for coronary bypass, congestive heart failure, hyperlipidemia all of which are currently stable.  Past Medical History  Diagnosis Date  . Hypertension   . Depression   . 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 Coronary artery disease     a. 05/2013 NSTEMI/Cath: LM 90d, LAD 90ost/90m, LCX 90ost, OM1 90p, RCA 100p;  b. 05/2013 CABG x 4: LIMA->LAD, VG->RI->OM, VG->PDA.  [name] Peripheral arterial disease   . Atrial flutter     a. Dx 05/2013-->dilt/coumadin.  [name] DVT (deep venous thrombosis) 02/2011    LLL  . GERD (gastroesophageal reflux disease)   . Compression fracture of cervical spine   . Rheumatoid arthritis   . Prostate cancer   . Stasis dermatitis     LLL, wears TED hose  . AAA (abdominal aortic aneurysm)   . 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 Chronic systolic CHF (congestive heart failure)     EF 45-55% per echo 05/28/13  . Hyperlipidemia   . Dysphagia     a. after prolonged intubation 05/2013; b. 06/2013 refused EDG/Dilatation.  . Myocardial infarction   . Hypothyroidism   . Stroke     denies  . Constipation   . Dysrhythmia   . ESRD on dialysis 06/2013    Mon and Thurs in [place]  . Varicose veins     Past Surgical History  Procedure Laterality Date   . Insertion of dialysis catheter N/A 05/26/2013    Procedure: INSERTION OF DIALYSIS CATHETER;  Surgeon: Elam Dutch, MD;  Location: [place];  Service: Vascular;  Laterality: N/A;  right IJ  . Cardiac surgery    . Cataract extraction    . Eye surgery Left     cataract  . Av fistula placement Left 07/08/2013    Procedure: INSERTION OF ARTERIOVENOUS (AV) GORE-TEX GRAFT ARM;  Surgeon: Elam Dutch, MD;  Location: [place];  Service: Vascular;  Laterality: Left;  . Coronary artery bypass graft N/A 05/13/2013    Procedure: CORONARY ARTERY BYPASS GRAFTING (CABG);  Surgeon: Gaye Pollack, MD;  Location: [place];  Service: Open Heart Surgery;  Laterality: N/A;  . Av fistula placement Left 02/03/2014    Procedure: INSERTION OF ARTERIOVENOUS (AV) GORE-TEX GRAFT USING 4-7 MM X 45 CM STRETCH GORETEX GRAFT;  Surgeon: Elam Dutch, MD;  Location: [place];  Service: Vascular;  Laterality: Left;  . Left heart catheterization with coronary angiogram N/A 05/13/2013    Procedure: LEFT HEART CATHETERIZATION WITH CORONARY ANGIOGRAM;  Surgeon: Burnell Blanks, MD;  Location: [facility] CATH LAB;  Service: Cardiovascular;  Laterality: N/A;    Social History History  Substance Use Topics  . Smoking status: Former Smoker -- 1.00 packs/day for 47 years    Start date: 11/30/1960  Quit date: 11/05/2000  . Smokeless tobacco: Never Used  . Alcohol Use: No     Comment: Previously drank 6 beers/day, quit in 1994    Family History Family History  Problem Relation Age of Onset  . Heart attack Mother     deceased @ 31  . Kidney failure Father     deceased @ 20  . Heart attack Son     deceased @ 77    Allergies  Allergies  Allergen Reactions  . 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 amiodarone (PACERONE) 200 MG tablet Take 1 tablet (200 mg total) by mouth daily. 30 tablet 6  . aspirin EC 325 MG tablet Take 1 tablet  (325 mg total) by mouth daily.    [name] atorvastatin (LIPITOR) 80 MG tablet Take 1 tablet (80 mg total) by mouth daily. 30 tablet 6  . B Complex-C-Folic Acid (DIALYVITE 295) 0.8 MG TABS Take 1 tablet by mouth daily.     . calcium acetate, Phos Binder, (PHOSLYRA) 667 MG/5ML SOLN Take 667 mg by mouth 3 (three) times daily with meals.    [name] diltiazem (CARDIZEM) 30 MG tablet Take 1 tablet (30 mg total) by mouth 2 (two) times daily. 60 tablet 6  . folic acid-vitamin b complex-vitamin c-selenium-zinc (DIALYVITE) 3 MG TABS tablet Take 1 tablet by mouth daily.    . furosemide (LASIX) 40 MG tablet Take 90 mg by mouth 2 (two) times daily.    . hydrOXYzine (ATARAX/VISTARIL) 25 MG tablet Take 1 tablet (25 mg total) by mouth 3 (three) times daily as needed for itching. 90 tablet 0  . levothyroxine (SYNTHROID, LEVOTHROID) 100 MCG tablet Take 1 tablet (100 mcg total) by mouth daily before breakfast. (Patient taking differently: Take 125 mcg by mouth daily before breakfast. ) 30 tablet 0  . meclizine (ANTIVERT) 25 MG tablet Take 25 mg by mouth 2 (two) times daily as needed for dizziness.     . ondansetron (ZOFRAN) 4 MG tablet Take 4 mg by mouth every 8 (eight) hours as needed.     . pantoprazole (PROTONIX) 40 MG tablet Take 40 mg by mouth 2 (two) times daily.    [name] acetaminophen (TYLENOL) 325 MG tablet Take 1-2 tablets (325-650 mg total) by mouth every 6 (six) hours as needed for pain. (Patient not taking: Reported on 03/24/2015)    . amoxicillin (AMOXIL) 500 MG capsule     . atorvastatin (LIPITOR) 20 MG tablet     . calcium acetate (PHOSLO) 667 MG capsule Take 667 mg by mouth. Take 8 daily    . ondansetron (ZOFRAN) 8 MG tablet Take 4 mg by mouth every 8 (eight) hours as needed for nausea.    . predniSONE (DELTASONE) 10 MG tablet      No current facility-administered medications for this visit.    ROS:   General:  No weight loss, Fever, chills  HEENT: No recent headaches, no nasal bleeding, no visual changes, no  sore throat  Neurologic: No dizziness, blackouts, seizures. No recent symptoms of stroke or mini- stroke. No recent episodes of slurred speech, or temporary blindness.  Cardiac: No recent episodes of chest pain/pressure, no shortness of breath at rest.  + shortness of breath with exertion.  Denies history of atrial fibrillation or irregular heartbeat  Vascular: No history of rest pain in feet.  No history of claudication.  No history of  non-healing ulcer, No history of DVT   Pulmonary: No home oxygen, no productive cough, no hemoptysis,  No asthma or wheezing  Musculoskeletal:  [ ]  Arthritis, [ ]  Low back pain,  [ ]  Joint pain  Hematologic:No history of hypercoagulable state.  No history of easy bleeding.  No history of anemia  Gastrointestinal: No hematochezia or melena,  No gastroesophageal reflux, no trouble swallowing  Urinary: [ ]  chronic Kidney disease, [x ] on HD - [ ]  MWF or [ ]  TTHS, [ ]  Burning with urination, [ ]  Frequent urination, [ ]  Difficulty urinating;   Skin: No rashes  Psychological: No history of anxiety,  No history of depression   Physical Examination  Filed Vitals:   03/24/15 0831  BP: 129/59  Pulse: 65  Height: 6\' 1"  (1.854 m)  Weight: 190 lb 4.8 oz (86.32 kg)  SpO2: 100%    Body mass index is 25.11 kg/(m^2).  General:  Alert and oriented, no acute distress HEENT: Normal Neck: No bruit or JVD Pulmonary: Clear to auscultation bilaterally Cardiac: Regular Rate and Rhythm without murmur Abdomen: Soft, non-tender, non-distended, vaguely palpable pulsatile epigastric mass Skin: Diffuse circumferential erythema with scaly skin left leg Extremity Pulses:  2+ radial, brachial, femoral, absent right dorsalis pedis, 2+ right posterior tibial pulses absent pedal pulses left foot  Musculoskeletal: No deformity bilateral pretibial edema F greater than right  Neurologic: Upper and lower extremity motor 5/5 and symmetric  DATA:  A noncontrast CT scan from  [facility] was reviewed today. This shows a 5.8 centimeter abdominal aortic aneurysm which is infrarenal in character. The neck right at the level of the renal arteries is of acceptable diameter for stent graft. However the cuts are 5 mm in length. The neck does flare out but this may be better defined on a fine cut CT scan. It is also difficult to identify the renal origins and iliac artery diameters based on this noncontrast CT. There is no evidence of iliac artery aneurysm.   ASSESSMENT:  5.8 cm infrarenal abdominal aortic aneurysm. This is currently asymptomatic but is of a size that repair should be considered. Patient also has evidence of peripheral arterial disease with a abnormal clinical pulse exam.   PLAN:  #1 CT angiogram abdomen and pelvis with runoff to evaluate aneurysm fully for stent graft repair as well as his lower extremity arterial tree #2 patient needs cardiac risk stratification preoperatively. #3 chronic left leg swelling with changes consistent with venous reflux disease patient currently has a treatment plan applying Vaseline to the scaly skin and occasionally wearing intermittent compression.  Aneurysm repair is tentatively scheduled for June 1 to accommodate the patient's dialysis schedule and the son's travel schedule.  Ruta Hinds, MD [facility] [place] Office: [phone] Pager: [phone]

## 2015-03-25 ENCOUNTER — Telehealth: Payer: Self-pay | Admitting: Vascular Surgery

## 2015-03-25 NOTE — Telephone Encounter (Signed)
Spoke with pt's wife. Gave following: CTA to be done on 03/29/15 3:20 pm at [place] office, no solid food 4 hrs prior. Labs to be done at [facility] prior to CTA. Cardiology appt: Dr. Harl Bowie in [facility] 04/01/15 11:40 am. Wife verbalized understanding.

## 2015-03-28 ENCOUNTER — Other Ambulatory Visit: Payer: Self-pay

## 2015-03-29 ENCOUNTER — Encounter: Payer: Self-pay | Admitting: Internal Medicine

## 2015-03-29 ENCOUNTER — Ambulatory Visit
Admission: RE | Admit: 2015-03-29 | Discharge: 2015-03-29 | Disposition: A | Discharge status: Home / Self Care | Payer: Medicare Other | Source: Ambulatory Visit | Attending: Vascular Surgery | Admitting: Vascular Surgery

## 2015-03-29 DIAGNOSIS — I714 Abdominal aortic aneurysm, without rupture, unspecified: Secondary | ICD-10-CM

## 2015-03-29 DIAGNOSIS — Z01810 Encounter for preprocedural cardiovascular examination: Secondary | ICD-10-CM

## 2015-03-29 MED ORDER — IOHEXOL 350 MG/ML SOLN
100.0000 mL | Freq: Once | INTRAVENOUS | Status: AC | PRN
  Administered 2015-03-29: 100 mL via INTRAVENOUS

## 2015-03-31 NOTE — Pre-Procedure Instructions (Signed)
[name]  03/31/2015      [facility] - [place], [place], [place] - [address] [place] [address] Phone: [phone] Fax: [phone]    Your procedure is scheduled on Wed, June 1 @ 11:35 AM  Report to [facility]  at 9:30 AM  Call this number if you have problems the morning of surgery:  [phone]   Remember:  Do not eat food or drink liquids after midnight.  Take these medicines the morning of surgery with A SIP OF WATER:Amiodarone(Pacerone),Cardizem(Diltiazem),Synthroid(Levothyroxine),Meclizine(Antivert-if needed),Zofran(Ondansetron-if needed),Protonix(Pantoprazole),and Prednisone(Deltasone)               No Goody's,BC's,Aleve,Ibuprofen,Fish Oil,or any Herbal Medications.                 Do not wear jewelry.  Do not wear lotions, powders, or colognes.  You may wear deodorant.  Men may shave face and neck.  Do not bring valuables to the hospital.  [facility] is not responsible for any belongings or valuables.  Contacts, dentures or bridgework may not be worn into surgery.  Leave your suitcase in the car.  After surgery it may be brought to your room.  For patients admitted to the hospital, discharge time will be determined by your treatment team.  Patients discharged the day of surgery will not be allowed to drive home.    Special instructions:  [place] - Preparing for Surgery  Before surgery, you can play an important role.  Because skin is not sterile, your skin needs to be as free of germs as possible.  You can reduce the number of germs on you skin by washing with CHG (chlorahexidine gluconate) soap before surgery.  CHG is an antiseptic cleaner which kills germs and bonds with the skin to continue killing germs even after washing.  Please DO NOT use if you have an allergy to CHG or antibacterial soaps.  If your skin becomes reddened/irritated stop using the CHG and inform your nurse when you arrive at Short  Stay.  Do not shave (including legs and underarms) for at least 48 hours prior to the first CHG shower.  You may shave your face.  Please follow these instructions carefully:   1.  Shower with CHG Soap the night before surgery and the                                morning of Surgery.  2.  If you choose to wash your hair, wash your hair first as usual with your       normal shampoo.  3.  After you shampoo, rinse your hair and body thoroughly to remove the                      Shampoo.  4.  Use CHG as you would any other liquid soap.  You can apply chg directly       to the skin and wash gently with scrungie or a clean washcloth.  5.  Apply the CHG Soap to your body ONLY FROM THE NECK DOWN.        Do not use on open wounds or open sores.  Avoid contact with your eyes,       ears, mouth and genitals (private parts).  Wash genitals (private parts)       with your normal soap.  6.  Wash thoroughly, paying special attention to the area where your surgery        will be performed.  7.  Thoroughly rinse your body with warm water from the neck down.  8.  DO NOT shower/wash with your normal soap after using and rinsing off       the CHG Soap.  9.  Pat yourself dry with a clean towel.            10.  Wear clean pajamas.            11.  Place clean sheets on your bed the night of your first shower and do not        sleep with pets.  Day of Surgery  Do not apply any lotions/deoderants the morning of surgery.  Please wear clean clothes to the hospital/surgery center.    Please read over the following fact sheets that you were given. Pain Booklet, Coughing and Deep Breathing, Blood Transfusion Information, MRSA Information and Surgical Site Infection Prevention

## 2015-04-01 ENCOUNTER — Encounter (HOSPITAL_COMMUNITY)
Admission: RE | Admit: 2015-04-01 | Discharge: 2015-04-01 | Disposition: A | Discharge status: Home / Self Care | Payer: Medicare Other | Source: Ambulatory Visit | Attending: Vascular Surgery | Admitting: Vascular Surgery

## 2015-04-01 ENCOUNTER — Encounter: Payer: Self-pay | Admitting: Cardiology

## 2015-04-01 ENCOUNTER — Other Ambulatory Visit: Payer: Self-pay

## 2015-04-01 ENCOUNTER — Encounter (HOSPITAL_COMMUNITY): Payer: Self-pay

## 2015-04-01 ENCOUNTER — Ambulatory Visit (INDEPENDENT_AMBULATORY_CARE_PROVIDER_SITE_OTHER): Payer: Medicare Other | Admitting: Cardiology

## 2015-04-01 VITALS — BP 122/65 | HR 62 | Ht 73.0 in | Wt 187.0 lb

## 2015-04-01 DIAGNOSIS — I10 Essential (primary) hypertension: Secondary | ICD-10-CM

## 2015-04-01 DIAGNOSIS — E785 Hyperlipidemia, unspecified: Secondary | ICD-10-CM | POA: Diagnosis not present

## 2015-04-01 DIAGNOSIS — N186 End stage renal disease: Secondary | ICD-10-CM

## 2015-04-01 DIAGNOSIS — I4891 Unspecified atrial fibrillation: Secondary | ICD-10-CM | POA: Diagnosis not present

## 2015-04-01 DIAGNOSIS — R600 Localized edema: Secondary | ICD-10-CM

## 2015-04-01 DIAGNOSIS — I2510 Atherosclerotic heart disease of native coronary artery without angina pectoris: Secondary | ICD-10-CM | POA: Diagnosis not present

## 2015-04-01 DIAGNOSIS — Z01810 Encounter for preprocedural cardiovascular examination: Secondary | ICD-10-CM

## 2015-04-01 LAB — BLOOD GAS, ARTERIAL
Acid-Base Excess: 3.9 mmol/L — ABNORMAL HIGH (ref 0.0–2.0)
BICARBONATE: 27.7 meq/L — AB (ref 20.0–24.0)
Drawn by: 206361
FIO2: 0.21 %
O2 SAT: 94.9 %
PATIENT TEMPERATURE: 98.6
PCO2 ART: 40.7 mmHg (ref 35.0–45.0)
PH ART: 7.448 (ref 7.350–7.450)
TCO2: 29 mmol/L (ref 0–100)
pO2, Arterial: 77.5 mmHg — ABNORMAL LOW (ref 80.0–100.0)

## 2015-04-01 LAB — PROTIME-INR
INR: 1.27 (ref 0.00–1.49)
Prothrombin Time: 16 seconds — ABNORMAL HIGH (ref 11.6–15.2)

## 2015-04-01 LAB — CBC
HCT: 30.5 % — ABNORMAL LOW (ref 39.0–52.0)
Hemoglobin: 9.3 g/dL — ABNORMAL LOW (ref 13.0–17.0)
MCH: 26.5 pg (ref 26.0–34.0)
MCHC: 30.5 g/dL (ref 30.0–36.0)
MCV: 86.9 fL (ref 78.0–100.0)
PLATELETS: 202 10*3/uL (ref 150–400)
RBC: 3.51 MIL/uL — ABNORMAL LOW (ref 4.22–5.81)
RDW: 19 % — ABNORMAL HIGH (ref 11.5–15.5)
WBC: 7.2 10*3/uL (ref 4.0–10.5)

## 2015-04-01 LAB — TYPE AND SCREEN
ABO/RH(D): O POS
ANTIBODY SCREEN: NEGATIVE

## 2015-04-01 LAB — COMPREHENSIVE METABOLIC PANEL
ALT: 24 U/L (ref 17–63)
ANION GAP: 11 (ref 5–15)
AST: 63 U/L — AB (ref 15–41)
Albumin: 2.6 g/dL — ABNORMAL LOW (ref 3.5–5.0)
Alkaline Phosphatase: 110 U/L (ref 38–126)
BUN: 29 mg/dL — ABNORMAL HIGH (ref 6–20)
CO2: 27 mmol/L (ref 22–32)
CREATININE: 5.53 mg/dL — AB (ref 0.61–1.24)
Calcium: 7.7 mg/dL — ABNORMAL LOW (ref 8.9–10.3)
Chloride: 101 mmol/L (ref 101–111)
GFR calc non Af Amer: 9 mL/min — ABNORMAL LOW (ref >60)
GFR, EST AFRICAN AMERICAN: 10 mL/min — AB (ref >60)
GLUCOSE: 82 mg/dL (ref 65–99)
Potassium: 4.1 mmol/L (ref 3.5–5.1)
Sodium: 139 mmol/L (ref 135–145)
Total Bilirubin: 1.1 mg/dL (ref 0.3–1.2)
Total Protein: 6.5 g/dL (ref 6.5–8.1)

## 2015-04-01 LAB — SURGICAL PCR SCREEN
MRSA, PCR: NEGATIVE
Staphylococcus aureus: POSITIVE — AB

## 2015-04-01 LAB — APTT: aPTT: 40 seconds — ABNORMAL HIGH (ref 24–37)

## 2015-04-01 MED ORDER — TORSEMIDE 20 MG PO TABS: 40.0000 mg | ORAL_TABLET | Freq: Two times a day (BID) | ORAL | Status: AC

## 2015-04-01 NOTE — Progress Notes (Signed)
Pt. Not able to produce urine today. Will get specimen DOS. Pt. Seen by Dr. Harl Bowie today.

## 2015-04-01 NOTE — Progress Notes (Signed)
Clinical Summary [name] is a 81 y.o.male seen today for follow up of the following medical problems.   1. CAD  - CABG 05/13/13 4 vessel: LIMA to LAD, SVG to OM, SVG to PDA, SVG to ramus. Presented w/ NSTEMI at that time.  - 05/2013 echo LVEF 45-50%  - since last visit denies any chest pain, SOB, or DOE.  - compliant w/ meds   2. HTN  - previous problems with low blood pressures and dizziness, often associated with HD  - symptoms improved with decreased fluid removal during HD sessions and decreasing his diltiazem     3. Afib  - denies any recent palpitations  - he had previously been on coumadin but requested to stop and change to ASA -  4. Hyperlipidemia  - compliant with statin - no recent panel in our system  5. ESRD - compliant with HD sessions Monday and Fridays  6. AAA - followed by vascular, planned surgery for June 1st   7. Venous insufficieny - chronic bilateral LE edema, L>R. Has been evaluated by vascular with venous US, + reflux    Past Medical History  Diagnosis Date  . Hypertension   . Depression   . 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 Coronary artery disease     a. 05/2013 NSTEMI/Cath: LM 90d, LAD 90ost/59m, LCX 90ost, OM1 90p, RCA 100p;  b. 05/2013 CABG x 4: LIMA->LAD, VG->RI->OM, VG->PDA.  [name] Peripheral arterial disease   . Atrial flutter     a. Dx 05/2013-->dilt/coumadin.  [name] DVT (deep venous thrombosis) 02/2011    LLL  . GERD (gastroesophageal reflux disease)   . Compression fracture of cervical spine   . Rheumatoid arthritis   . Prostate cancer   . Stasis dermatitis     LLL, wears TED hose  . AAA (abdominal aortic aneurysm)   . 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 Chronic systolic CHF (congestive heart failure)     EF 45-55% per echo 05/28/13  . Hyperlipidemia   . Dysphagia     a. after prolonged intubation 05/2013; b. 06/2013 refused EDG/Dilatation.  . Myocardial infarction   . Hypothyroidism   . Stroke    denies  . Constipation   . Dysrhythmia   . ESRD on dialysis 06/2013    Mon and Thurs in [place]  . Varicose veins      Allergies  Allergen Reactions  . 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 acetaminophen (TYLENOL) 325 MG tablet Take 1-2 tablets (325-650 mg total) by mouth every 6 (six) hours as needed for pain. (Patient not taking: Reported on 03/24/2015)    . amiodarone (PACERONE) 200 MG tablet Take 1 tablet (200 mg total) by mouth daily. 30 tablet 6  . amoxicillin (AMOXIL) 500 MG capsule     . aspirin EC 325 MG tablet Take 1 tablet (325 mg total) by mouth daily.    [name] atorvastatin (LIPITOR) 20 MG tablet     . atorvastatin (LIPITOR) 80 MG tablet Take 1 tablet (80 mg total) by mouth daily. 30 tablet 6  . B Complex-C-Folic Acid (DIALYVITE 130) 0.8 MG TABS Take 1 tablet by mouth daily.     . calcium acetate (PHOSLO) 667 MG capsule Take 667 mg by mouth. Take 8 daily    . calcium acetate, Phos Binder, (  PHOSLYRA) 667 MG/5ML SOLN Take 667 mg by mouth 3 (three) times daily with meals.    [name] diltiazem (CARDIZEM) 30 MG tablet Take 1 tablet (30 mg total) by mouth 2 (two) times daily. 60 tablet 6  . folic acid-vitamin b complex-vitamin c-selenium-zinc (DIALYVITE) 3 MG TABS tablet Take 1 tablet by mouth daily.    . furosemide (LASIX) 40 MG tablet Take 90 mg by mouth 2 (two) times daily.    . hydrOXYzine (ATARAX/VISTARIL) 25 MG tablet Take 1 tablet (25 mg total) by mouth 3 (three) times daily as needed for itching. 90 tablet 0  . levothyroxine (SYNTHROID, LEVOTHROID) 100 MCG tablet Take 1 tablet (100 mcg total) by mouth daily before breakfast. (Patient taking differently: Take 125 mcg by mouth daily before breakfast. ) 30 tablet 0  . meclizine (ANTIVERT) 25 MG tablet Take 25 mg by mouth 2 (two) times daily as needed for dizziness.     . ondansetron (ZOFRAN) 4 MG tablet Take 4 mg by mouth every 8 (eight) hours as  needed.     . ondansetron (ZOFRAN) 8 MG tablet Take 4 mg by mouth every 8 (eight) hours as needed for nausea.    . pantoprazole (PROTONIX) 40 MG tablet Take 40 mg by mouth 2 (two) times daily.    . predniSONE (DELTASONE) 10 MG tablet      No current facility-administered medications for this visit.     Past Surgical History  Procedure Laterality Date  . Insertion of dialysis catheter N/A 05/26/2013    Procedure: INSERTION OF DIALYSIS CATHETER;  Surgeon: Elam Dutch, MD;  Location: [place];  Service: Vascular;  Laterality: N/A;  right IJ  . Cardiac surgery    . Cataract extraction    . Eye surgery Left     cataract  . Av fistula placement Left 07/08/2013    Procedure: INSERTION OF ARTERIOVENOUS (AV) GORE-TEX GRAFT ARM;  Surgeon: Elam Dutch, MD;  Location: [place];  Service: Vascular;  Laterality: Left;  . Coronary artery bypass graft N/A 05/13/2013    Procedure: CORONARY ARTERY BYPASS GRAFTING (CABG);  Surgeon: Gaye Pollack, MD;  Location: [place];  Service: Open Heart Surgery;  Laterality: N/A;  . Av fistula placement Left 02/03/2014    Procedure: INSERTION OF ARTERIOVENOUS (AV) GORE-TEX GRAFT USING 4-7 MM X 45 CM STRETCH GORETEX GRAFT;  Surgeon: Elam Dutch, MD;  Location: [place];  Service: Vascular;  Laterality: Left;  . Left heart catheterization with coronary angiogram N/A 05/13/2013    Procedure: LEFT HEART CATHETERIZATION WITH CORONARY ANGIOGRAM;  Surgeon: Burnell Blanks, MD;  Location: [facility] C[facility] LAB;  Service: Cardiovascular;  Laterality: N/A;     Allergies  Allergen Reactions  . 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 Heart attack Mother     deceased @ 15  . Kidney failure Father     deceased @ 13  . Heart attack Son     deceased @ 4     Social History [name] reports that he quit smoking about 14 years ago. He started smoking about 54 years ago. He has never used smokeless  tobacco. [name] reports that he does not drink alcohol.   Review of Systems CONSTITUTIONAL: No weight loss, fever, chills, weakness or fatigue.  HEENT: Eyes: No visual loss, blurred vision, double vision or yellow sclerae.No hearing loss, sneezing, congestion, runny  nose or sore throat.  SKIN: No rash or itching.  CARDIOVASCULAR: per HPI RESPIRATORY: No shortness of breath, cough or sputum.  GASTROINTESTINAL: No anorexia, nausea, vomiting or diarrhea. No abdominal pain or blood.  GENITOURINARY: No burning on urination, no polyuria NEUROLOGICAL: No headache, dizziness, syncope, paralysis, ataxia, numbness or tingling in the extremities. No change in bowel or bladder control.  MUSCULOSKELETAL: No muscle, back pain, joint pain or stiffness.  LYMPHATICS: No enlarged nodes. No history of splenectomy.  PSYCHIATRIC: No history of depression or anxiety.  ENDOCRINOLOGIC: No reports of sweating, cold or heat intolerance. No polyuria or polydipsia.  [name]   Physical Examination Filed Vitals:   04/01/15 1122  BP: 122/65  Pulse: 62   Filed Vitals:   04/01/15 1122  Height: 6\' 1"  (1.854 m)  Weight: 187 lb (84.823 kg)    Gen: resting comfortably, no acute distress HEENT: no scleral icterus, pupils equal round and reactive, no palptable cervical adenopathy,  CV: RRR, no m/r/g, no JVD Resp: Clear to auscultation bilaterally GI: abdomen is soft, non-tender, non-distended, normal bowel sounds, no hepatosplenomegaly MSK: extremities are warm, 1+ bilateral LE edema Skin: warm, no rash Neuro:  no focal deficits Psych: appropriate affect   Diagnostic Studies 05/2013 Echo  - LVEF 45-50%, hypokinetic septal and mid and basal inferior walls. Mild LAE   08/28/13 Abdominal xray  Normal bowel gas pattern   08/28/13 Labs Na 136 K 4.9 AST 44 ALT 38 A phos 90 T.bili 0.8 TSH 2.92 Lipase 28      Assessment and Plan  1. CAD  - no current symptoms  - continue current medications   2. HTN   - recent problems with hypotension, has improved since cutting back on diltiazem and the amount of fluid removed during HD was decreased.  - continue to follow clinically  3. Afib  - denies any current symptoms  - continue current meds. Will need repeat LFTs and thyroid next visit - he requested to stop coumadin and change to ASA in the past, we discussed the increased of stroke in this change however he understands risks and has not changed his decision. Continue ASA   4. Hyperlipidemia - will request most recent panel from pcp - continue high dose statin  5. AAA - plans for surgery June 1. From cardiac standpoint he has not active acute cardiac conditions. CABG in 05/2013, has had no recent cardiac symptoms. Reports he tolerates greater than 4 METs without limitation - recommend proceed with surgery as planned, ok to hold ASA if needed in periop period  6. LE edema - primarily due to venous insufficiency, likely also related to his ESRD and probable diastolic dysfunction.  - continue HD, still makes some urine, change lasix 90mg  bid to torsemide 40mg  bid.   F/u 4 months      Arnoldo Lenis, M.D.

## 2015-04-01 NOTE — Progress Notes (Signed)
Call to [facility], spoke with Colletta [place], pt. Will continue asa until day of surgery; on DOS- pt. Is instructed to hold the asa.

## 2015-04-01 NOTE — Patient Instructions (Signed)
   Stop Lasix  Begin Torsemide 40mg  twice a day  - new sent to pharmacy today. Continue all other medications.   Follow up in  4 months.

## 2015-04-01 NOTE — Progress Notes (Signed)
Ph RX tech reviewed meds. During PAT visit.

## 2015-04-04 NOTE — Progress Notes (Addendum)
Anesthesia Chart Review:  Pt is 81 year old male scheduled for abdominal aortic endovascular stent graft on 04/06/2015 with Dr. Oneida Alar.   Cardiologist is Dr. Harl Bowie, last office visit 04/01/15.   PMH includes: CAD (a. 05/2013 NSTEMI/Cath: LM 90d, LAD 90ost/60m, LCX 90ost, OM1 90p, RCA 100p; b. 05/2013 CABG x 4: LIMA->LAD, VG->RI->OM, VG->PDA), atrial flutter, HTN, CHF, AAA, PAD, R iliac artery dissection (2008), RA, ESRD on dialysis, prostate cancer, hypothyroidism, hyperlipidemia, DVT (2012). Former smoker. BMI 25. S/p insertion AV gore-tex graft 02/03/14 and 07/08/13. S/p dialysis catheter insertion 05/26/13. S/p CABG 05/13/13.   Medications include: amiodarone, ASA, lipitor, diltiazem, levothyroxine, demadex. Pt to hold ASA DOS.   Preoperative labs reviewed.  PTT 40. Pt 16. Will recheck coags DOS. H/H 9.3/30.5. Notified Stephanie in Dr. Oneida Alar' office of coags and anemia. Renal function c/w ESRD.   EKG 04/01/2015: Sinus rhythm with short PR. Non-specific intra-ventricular conduction block. Possible Anterolateral infarct, age undetermined. Since last tracing rate slower  Echo 05/31/2013: - Left ventricle: Septal and mid and basal inferior wall hypokinesis The cavity size was mildly dilated. Wallthickness was normal. Systolic function was mildly reduced. The estimated ejection fraction was in the range of 45% to 50%. - Left atrium: The atrium was mildly dilated. - Impressions: Overall image quality for study is poor  Pt has cardiac clearance from Dr. Harl Bowie in [place] note dated 04/01/2015.  Willeen Cass, FNP-BC [facility]/Anesthesiology Phone: [phone] 04/05/2015 9:28 AM

## 2015-04-05 ENCOUNTER — Encounter: Payer: Self-pay | Admitting: *Deleted

## 2015-04-05 MED ORDER — SODIUM CHLORIDE 0.9 % IV SOLN
INTRAVENOUS | Status: DC
  Administered 2015-04-06: 11:00:00 via INTRAVENOUS

## 2015-04-05 MED ORDER — DEXTROSE 5 % IV SOLN
1.5000 g | INTRAVENOUS | Status: DC
  Filled 2015-04-05: qty 1.5

## 2015-04-05 NOTE — Progress Notes (Signed)
Hemoglobin (9.3), PT (16) & PTT (40) results from pre-op appointment on 04/01/15 called to this nurse from Willeen Cass, NP. Notified Dr. Oneida Alar of results; verbalized understanding. No new orders given.

## 2015-04-06 ENCOUNTER — Encounter (HOSPITAL_COMMUNITY)
Admission: RE | Disposition: A | Discharge status: Home / Self Care | Payer: Self-pay | Source: Ambulatory Visit | Attending: Vascular Surgery

## 2015-04-06 ENCOUNTER — Inpatient Hospital Stay (HOSPITAL_COMMUNITY): Payer: Medicare Other | Admitting: Emergency Medicine

## 2015-04-06 ENCOUNTER — Inpatient Hospital Stay (HOSPITAL_COMMUNITY): Payer: Medicare Other | Admitting: Certified Registered Nurse Anesthetist

## 2015-04-06 ENCOUNTER — Inpatient Hospital Stay (HOSPITAL_COMMUNITY)
Admission: RE | Admit: 2015-04-06 | Discharge: 2015-04-07 | DRG: 268 | Disposition: A | Discharge status: Home / Self Care | Payer: Medicare Other | Source: Ambulatory Visit | Attending: Vascular Surgery | Admitting: Vascular Surgery

## 2015-04-06 ENCOUNTER — Encounter (HOSPITAL_COMMUNITY): Payer: Self-pay | Admitting: *Deleted

## 2015-04-06 ENCOUNTER — Inpatient Hospital Stay (HOSPITAL_COMMUNITY): Payer: Medicare Other

## 2015-04-06 DIAGNOSIS — I5022 Chronic systolic (congestive) heart failure: Secondary | ICD-10-CM | POA: Diagnosis present

## 2015-04-06 DIAGNOSIS — Z79899 Other long term (current) drug therapy: Secondary | ICD-10-CM | POA: Diagnosis not present

## 2015-04-06 DIAGNOSIS — I2510 Atherosclerotic heart disease of native coronary artery without angina pectoris: Secondary | ICD-10-CM | POA: Diagnosis present

## 2015-04-06 DIAGNOSIS — Z7982 Long term (current) use of aspirin: Secondary | ICD-10-CM | POA: Diagnosis not present

## 2015-04-06 DIAGNOSIS — Z87891 Personal history of nicotine dependence: Secondary | ICD-10-CM | POA: Diagnosis not present

## 2015-04-06 DIAGNOSIS — I739 Peripheral vascular disease, unspecified: Secondary | ICD-10-CM | POA: Diagnosis present

## 2015-04-06 DIAGNOSIS — I252 Old myocardial infarction: Secondary | ICD-10-CM

## 2015-04-06 DIAGNOSIS — N186 End stage renal disease: Secondary | ICD-10-CM | POA: Diagnosis present

## 2015-04-06 DIAGNOSIS — Z951 Presence of aortocoronary bypass graft: Secondary | ICD-10-CM | POA: Diagnosis not present

## 2015-04-06 DIAGNOSIS — E039 Hypothyroidism, unspecified: Secondary | ICD-10-CM | POA: Diagnosis present

## 2015-04-06 DIAGNOSIS — K219 Gastro-esophageal reflux disease without esophagitis: Secondary | ICD-10-CM | POA: Diagnosis present

## 2015-04-06 DIAGNOSIS — I714 Abdominal aortic aneurysm, without rupture, unspecified: Secondary | ICD-10-CM | POA: Diagnosis present

## 2015-04-06 DIAGNOSIS — I120 Hypertensive chronic kidney disease with stage 5 chronic kidney disease or end stage renal disease: Secondary | ICD-10-CM | POA: Diagnosis present

## 2015-04-06 DIAGNOSIS — Z8249 Family history of ischemic heart disease and other diseases of the circulatory system: Secondary | ICD-10-CM

## 2015-04-06 DIAGNOSIS — I872 Venous insufficiency (chronic) (peripheral): Secondary | ICD-10-CM | POA: Diagnosis present

## 2015-04-06 DIAGNOSIS — M069 Rheumatoid arthritis, unspecified: Secondary | ICD-10-CM | POA: Diagnosis present

## 2015-04-06 DIAGNOSIS — Z992 Dependence on renal dialysis: Secondary | ICD-10-CM

## 2015-04-06 DIAGNOSIS — Z8673 Personal history of transient ischemic attack (TIA), and cerebral infarction without residual deficits: Secondary | ICD-10-CM

## 2015-04-06 DIAGNOSIS — D631 Anemia in chronic kidney disease: Secondary | ICD-10-CM | POA: Diagnosis present

## 2015-04-06 DIAGNOSIS — E785 Hyperlipidemia, unspecified: Secondary | ICD-10-CM | POA: Diagnosis present

## 2015-04-06 DIAGNOSIS — Z9889 Other specified postprocedural states: Secondary | ICD-10-CM

## 2015-04-06 HISTORY — PX: PATCH ANGIOPLASTY: SHX6230

## 2015-04-06 HISTORY — PX: ABDOMINAL AORTIC ENDOVASCULAR STENT GRAFT: SHX5707

## 2015-04-06 HISTORY — PX: ENDARTERECTOMY FEMORAL: SHX5804

## 2015-04-06 LAB — PROTIME-INR
INR: 1.23 (ref 0.00–1.49)
INR: 1.32 (ref 0.00–1.49)
Prothrombin Time: 15.6 seconds — ABNORMAL HIGH (ref 11.6–15.2)
Prothrombin Time: 16.5 seconds — ABNORMAL HIGH (ref 11.6–15.2)

## 2015-04-06 LAB — BASIC METABOLIC PANEL
Anion gap: 10 (ref 5–15)
BUN: 32 mg/dL — ABNORMAL HIGH (ref 6–20)
CO2: 25 mmol/L (ref 22–32)
Calcium: 7.6 mg/dL — ABNORMAL LOW (ref 8.9–10.3)
Chloride: 104 mmol/L (ref 101–111)
Creatinine, Ser: 7.51 mg/dL — ABNORMAL HIGH (ref 0.61–1.24)
GFR calc non Af Amer: 6 mL/min — ABNORMAL LOW (ref >60)
GFR, EST AFRICAN AMERICAN: 7 mL/min — AB (ref >60)
Glucose, Bld: 76 mg/dL (ref 65–99)
POTASSIUM: 4.1 mmol/L (ref 3.5–5.1)
SODIUM: 139 mmol/L (ref 135–145)

## 2015-04-06 LAB — APTT: aPTT: 35 seconds (ref 24–37)

## 2015-04-06 LAB — POCT I-STAT 4, (NA,K, GLUC, HGB,HCT)
GLUCOSE: 80 mg/dL (ref 65–99)
HEMATOCRIT: 31 % — AB (ref 39.0–52.0)
Hemoglobin: 10.5 g/dL — ABNORMAL LOW (ref 13.0–17.0)
POTASSIUM: 3.9 mmol/L (ref 3.5–5.1)
Sodium: 141 mmol/L (ref 135–145)

## 2015-04-06 LAB — CBC
HCT: 28.7 % — ABNORMAL LOW (ref 39.0–52.0)
Hemoglobin: 8.9 g/dL — ABNORMAL LOW (ref 13.0–17.0)
MCH: 26.9 pg (ref 26.0–34.0)
MCHC: 31 g/dL (ref 30.0–36.0)
MCV: 86.7 fL (ref 78.0–100.0)
Platelets: 180 10*3/uL (ref 150–400)
RBC: 3.31 MIL/uL — ABNORMAL LOW (ref 4.22–5.81)
RDW: 18.9 % — AB (ref 11.5–15.5)
WBC: 7.1 10*3/uL (ref 4.0–10.5)

## 2015-04-06 LAB — MAGNESIUM: Magnesium: 1.8 mg/dL (ref 1.7–2.4)

## 2015-04-06 SURGERY — INSERTION, ENDOVASCULAR STENT GRAFT, AORTA, ABDOMINAL
Anesthesia: General | Site: Groin | Laterality: Right

## 2015-04-06 MED ORDER — ACETAMINOPHEN 650 MG RE SUPP: 325.0000 mg | RECTAL | Status: DC | PRN

## 2015-04-06 MED ORDER — PROMETHAZINE HCL 25 MG/ML IJ SOLN
6.2500 mg | INTRAMUSCULAR | Status: AC | PRN
  Administered 2015-04-06 (×2): 6.25 mg via INTRAVENOUS

## 2015-04-06 MED ORDER — AMIODARONE HCL 200 MG PO TABS
200.0000 mg | ORAL_TABLET | Freq: Every day | ORAL | Status: DC
  Administered 2015-04-06 – 2015-04-07 (×2): 200 mg via ORAL
  Filled 2015-04-06 (×2): qty 1

## 2015-04-06 MED ORDER — MECLIZINE HCL 25 MG PO TABS
25.0000 mg | ORAL_TABLET | Freq: Two times a day (BID) | ORAL | Status: DC
  Administered 2015-04-06 – 2015-04-07 (×2): 25 mg via ORAL
  Filled 2015-04-06 (×3): qty 1

## 2015-04-06 MED ORDER — IODIXANOL 320 MG/ML IV SOLN
INTRAVENOUS | Status: DC | PRN
  Administered 2015-04-06: 35.4 mL via INTRAVENOUS

## 2015-04-06 MED ORDER — SODIUM CHLORIDE 0.9 % IR SOLN
Status: DC | PRN
  Administered 2015-04-06 (×2): 500 mL

## 2015-04-06 MED ORDER — PANTOPRAZOLE SODIUM 40 MG PO TBEC
40.0000 mg | DELAYED_RELEASE_TABLET | Freq: Two times a day (BID) | ORAL | Status: DC
  Administered 2015-04-06 – 2015-04-07 (×2): 40 mg via ORAL
  Filled 2015-04-06 (×2): qty 1

## 2015-04-06 MED ORDER — GLYCOPYRROLATE 0.2 MG/ML IJ SOLN
INTRAMUSCULAR | Status: DC | PRN
  Administered 2015-04-06: 0.4 mg via INTRAVENOUS

## 2015-04-06 MED ORDER — METOPROLOL TARTRATE 1 MG/ML IV SOLN: 2.0000 mg | INTRAVENOUS | Status: DC | PRN

## 2015-04-06 MED ORDER — ACETAMINOPHEN 325 MG PO TABS: 325.0000 mg | ORAL_TABLET | ORAL | Status: DC | PRN

## 2015-04-06 MED ORDER — DILTIAZEM HCL 30 MG PO TABS
30.0000 mg | ORAL_TABLET | Freq: Two times a day (BID) | ORAL | Status: DC
  Administered 2015-04-06 – 2015-04-07 (×2): 30 mg via ORAL
  Filled 2015-04-06 (×3): qty 1

## 2015-04-06 MED ORDER — THROMBIN 20000 UNITS EX SOLR
CUTANEOUS | Status: AC
  Filled 2015-04-06: qty 20000

## 2015-04-06 MED ORDER — PROPOFOL 10 MG/ML IV BOLUS
INTRAVENOUS | Status: DC | PRN
  Administered 2015-04-06: 130 mg via INTRAVENOUS

## 2015-04-06 MED ORDER — 0.9 % SODIUM CHLORIDE (POUR BTL) OPTIME
TOPICAL | Status: DC | PRN
  Administered 2015-04-06: 1000 mL

## 2015-04-06 MED ORDER — MAGNESIUM SULFATE 2 GM/50ML IV SOLN: 2.0000 g | Freq: Every day | INTRAVENOUS | Status: DC | PRN

## 2015-04-06 MED ORDER — FENTANYL CITRATE (PF) 250 MCG/5ML IJ SOLN
INTRAMUSCULAR | Status: AC
  Filled 2015-04-06: qty 5

## 2015-04-06 MED ORDER — HYDRALAZINE HCL 20 MG/ML IJ SOLN: 5.0000 mg | INTRAMUSCULAR | Status: DC | PRN

## 2015-04-06 MED ORDER — LIDOCAINE HCL (CARDIAC) 20 MG/ML IV SOLN
INTRAVENOUS | Status: AC
  Filled 2015-04-06: qty 5

## 2015-04-06 MED ORDER — POTASSIUM CHLORIDE CRYS ER 20 MEQ PO TBCR: 20.0000 meq | EXTENDED_RELEASE_TABLET | Freq: Every day | ORAL | Status: DC | PRN

## 2015-04-06 MED ORDER — DEXTROSE 5 % IV SOLN
1.5000 g | INTRAVENOUS | Status: DC | PRN
  Administered 2015-04-06: 1.5 g via INTRAVENOUS

## 2015-04-06 MED ORDER — DOCUSATE SODIUM 100 MG PO CAPS
100.0000 mg | ORAL_CAPSULE | Freq: Every day | ORAL | Status: DC
  Administered 2015-04-07: 100 mg via ORAL
  Filled 2015-04-06: qty 1

## 2015-04-06 MED ORDER — OXYCODONE-ACETAMINOPHEN 5-325 MG PO TABS: 1.0000 | ORAL_TABLET | ORAL | Status: DC | PRN

## 2015-04-06 MED ORDER — ENOXAPARIN SODIUM 30 MG/0.3ML ~~LOC~~ SOLN
30.0000 mg | SUBCUTANEOUS | Status: DC
  Filled 2015-04-06 (×2): qty 0.3

## 2015-04-06 MED ORDER — GUAIFENESIN-DM 100-10 MG/5ML PO SYRP: 15.0000 mL | ORAL_SOLUTION | ORAL | Status: DC | PRN

## 2015-04-06 MED ORDER — ONDANSETRON HCL 4 MG/2ML IJ SOLN: 4.0000 mg | Freq: Four times a day (QID) | INTRAMUSCULAR | Status: DC | PRN

## 2015-04-06 MED ORDER — SODIUM CHLORIDE 0.9 % IV SOLN
INTRAVENOUS | Status: DC
  Administered 2015-04-06 (×2): via INTRAVENOUS

## 2015-04-06 MED ORDER — MORPHINE SULFATE 2 MG/ML IJ SOLN: 2.0000 mg | INTRAMUSCULAR | Status: DC | PRN

## 2015-04-06 MED ORDER — HYDROXYZINE HCL 25 MG PO TABS
25.0000 mg | ORAL_TABLET | Freq: Two times a day (BID) | ORAL | Status: DC
  Administered 2015-04-06 – 2015-04-07 (×2): 25 mg via ORAL
  Filled 2015-04-06 (×3): qty 1

## 2015-04-06 MED ORDER — HEPARIN SODIUM (PORCINE) 1000 UNIT/ML IJ SOLN
INTRAMUSCULAR | Status: AC
  Filled 2015-04-06: qty 1

## 2015-04-06 MED ORDER — PHENYLEPHRINE HCL 10 MG/ML IJ SOLN
10.0000 mg | INTRAMUSCULAR | Status: DC | PRN
  Administered 2015-04-06: 10 ug/min via INTRAVENOUS

## 2015-04-06 MED ORDER — HEPARIN SODIUM (PORCINE) 1000 UNIT/ML IJ SOLN
INTRAMUSCULAR | Status: DC | PRN
  Administered 2015-04-06: 5000 [IU] via INTRAVENOUS
  Administered 2015-04-06: 8000 [IU] via INTRAVENOUS

## 2015-04-06 MED ORDER — LABETALOL HCL 5 MG/ML IV SOLN: 10.0000 mg | INTRAVENOUS | Status: DC | PRN

## 2015-04-06 MED ORDER — PROMETHAZINE HCL 25 MG/ML IJ SOLN
INTRAMUSCULAR | Status: AC
  Filled 2015-04-06: qty 1

## 2015-04-06 MED ORDER — ALUM & MAG HYDROXIDE-SIMETH 200-200-20 MG/5ML PO SUSP: 15.0000 mL | ORAL | Status: DC | PRN

## 2015-04-06 MED ORDER — TORSEMIDE 20 MG PO TABS
40.0000 mg | ORAL_TABLET | Freq: Two times a day (BID) | ORAL | Status: DC
  Administered 2015-04-06 – 2015-04-07 (×2): 40 mg via ORAL
  Filled 2015-04-06 (×4): qty 2

## 2015-04-06 MED ORDER — ROCURONIUM BROMIDE 50 MG/5ML IV SOLN
INTRAVENOUS | Status: AC
  Filled 2015-04-06: qty 2

## 2015-04-06 MED ORDER — CHLORHEXIDINE GLUCONATE CLOTH 2 % EX PADS: 6.0000 | MEDICATED_PAD | Freq: Once | CUTANEOUS | Status: DC

## 2015-04-06 MED ORDER — LIDOCAINE HCL (CARDIAC) 20 MG/ML IV SOLN
INTRAVENOUS | Status: DC | PRN
  Administered 2015-04-06: 100 mg via INTRAVENOUS

## 2015-04-06 MED ORDER — PHENOL 1.4 % MT LIQD: 1.0000 | OROMUCOSAL | Status: DC | PRN

## 2015-04-06 MED ORDER — FENTANYL CITRATE (PF) 100 MCG/2ML IJ SOLN
25.0000 ug | INTRAMUSCULAR | Status: DC | PRN
Start: 2015-04-06 — End: 2015-04-06

## 2015-04-06 MED ORDER — ONDANSETRON HCL 4 MG PO TABS: 4.0000 mg | ORAL_TABLET | Freq: Three times a day (TID) | ORAL | Status: DC | PRN

## 2015-04-06 MED ORDER — ASPIRIN EC 325 MG PO TBEC
325.0000 mg | DELAYED_RELEASE_TABLET | Freq: Every day | ORAL | Status: DC
  Administered 2015-04-06 – 2015-04-07 (×2): 325 mg via ORAL
  Filled 2015-04-06 (×2): qty 1

## 2015-04-06 MED ORDER — CISATRACURIUM BESYLATE 20 MG/10ML IV SOLN
INTRAVENOUS | Status: AC
  Filled 2015-04-06: qty 10

## 2015-04-06 MED ORDER — DEXTROSE 5 % IV SOLN
1.5000 g | INTRAVENOUS | Status: AC
  Administered 2015-04-07: 1.5 g via INTRAVENOUS
  Filled 2015-04-06: qty 1.5

## 2015-04-06 MED ORDER — FENTANYL CITRATE (PF) 100 MCG/2ML IJ SOLN
INTRAMUSCULAR | Status: DC | PRN
  Administered 2015-04-06: 50 ug via INTRAVENOUS

## 2015-04-06 MED ORDER — SODIUM CHLORIDE 0.9 % IV SOLN
INTRAVENOUS | Status: DC | PRN
  Administered 2015-04-06: 12:00:00 via INTRAVENOUS

## 2015-04-06 MED ORDER — ONDANSETRON HCL 4 MG/2ML IJ SOLN
INTRAMUSCULAR | Status: DC | PRN
  Administered 2015-04-06: 4 mg via INTRAVENOUS

## 2015-04-06 MED ORDER — ATORVASTATIN CALCIUM 80 MG PO TABS
80.0000 mg | ORAL_TABLET | Freq: Every day | ORAL | Status: DC
  Administered 2015-04-06: 80 mg via ORAL
  Filled 2015-04-06 (×2): qty 1

## 2015-04-06 MED ORDER — CISATRACURIUM BESYLATE (PF) 10 MG/5ML IV SOLN
INTRAVENOUS | Status: DC | PRN
  Administered 2015-04-06: 2 mg via INTRAVENOUS
  Administered 2015-04-06: 10 mg via INTRAVENOUS
  Administered 2015-04-06: 1 mg via INTRAVENOUS

## 2015-04-06 MED ORDER — ARTIFICIAL TEARS OP OINT
TOPICAL_OINTMENT | OPHTHALMIC | Status: DC | PRN
  Administered 2015-04-06: 1 via OPHTHALMIC

## 2015-04-06 MED ORDER — LEVOTHYROXINE SODIUM 125 MCG PO TABS
125.0000 ug | ORAL_TABLET | Freq: Every day | ORAL | Status: DC
  Administered 2015-04-07: 125 ug via ORAL
  Filled 2015-04-06 (×2): qty 1

## 2015-04-06 MED ORDER — PROPOFOL 10 MG/ML IV BOLUS
INTRAVENOUS | Status: AC
  Filled 2015-04-06: qty 20

## 2015-04-06 MED ORDER — SODIUM CHLORIDE 0.9 % IV SOLN
0.2500 mg/kg/h | INTRAVENOUS | Status: DC
  Filled 2015-04-06: qty 250

## 2015-04-06 MED ORDER — NEOSTIGMINE METHYLSULFATE 10 MG/10ML IV SOLN
INTRAVENOUS | Status: DC | PRN
  Administered 2015-04-06: 3 mg via INTRAVENOUS

## 2015-04-06 MED ORDER — SODIUM CHLORIDE 0.9 % IV SOLN: 500.0000 mL | Freq: Once | INTRAVENOUS | Status: AC | PRN

## 2015-04-06 MED ORDER — ONDANSETRON HCL 4 MG/2ML IJ SOLN
INTRAMUSCULAR | Status: AC
  Filled 2015-04-06: qty 2

## 2015-04-06 SURGICAL SUPPLY — 65 items
CANISTER SUCTION 2500CC (MISCELLANEOUS) ×3 IMPLANT
CATH ANGIO 5F BER2 65CM (CATHETERS) ×1 IMPLANT
CATH BEACON 5.038 65CM KMP-01 (CATHETERS) IMPLANT
CATH EMB 4FR 80CM (CATHETERS) ×1 IMPLANT
CATH OMNI FLUSH .035X70CM (CATHETERS) ×1 IMPLANT
CLIP TI MEDIUM 6 (CLIP) ×1 IMPLANT
CLIP TI WIDE RED SMALL 6 (CLIP) ×1 IMPLANT
COVER BACK TABLE 80X110 HD (DRAPES) ×1 IMPLANT
COVER PROBE W GEL 5X96 (DRAPES) ×3 IMPLANT
DEVICE CLOSURE PERCLS PRGLD 6F (VASCULAR PRODUCTS) IMPLANT
DRSG TEGADERM 2-3/8X2-3/4 SM (GAUZE/BANDAGES/DRESSINGS) ×5 IMPLANT
DRYSEAL FLEXSHEATH 12FR 33CM (SHEATH) ×1
DRYSEAL FLEXSHEATH 18FR 33CM (SHEATH) ×1
ELECT CAUTERY BLADE 6.4 (BLADE) ×1 IMPLANT
ELECT REM PT RETURN 9FT ADLT (ELECTROSURGICAL) ×6
ELECTRODE REM PT RTRN 9FT ADLT (ELECTROSURGICAL) ×4 IMPLANT
EXCLUDER TNK LEG 28MX14X16 (Endovascular Graft) IMPLANT
EXCLUDER TRUNK LEG 28MX14X16 (Endovascular Graft) ×3 IMPLANT
GAUZE SPONGE 2X2 8PLY STRL LF (GAUZE/BANDAGES/DRESSINGS) IMPLANT
GLOVE BIO SURGEON STRL SZ 6.5 (GLOVE) ×1 IMPLANT
GLOVE BIO SURGEON STRL SZ7.5 (GLOVE) ×3 IMPLANT
GLOVE BIOGEL PI IND STRL 6 (GLOVE) IMPLANT
GLOVE BIOGEL PI INDICATOR 6 (GLOVE) ×1
GLOVE ECLIPSE 7.0 STRL STRAW (GLOVE) ×1 IMPLANT
GOWN STRL REUS W/ TWL LRG LVL3 (GOWN DISPOSABLE) ×6 IMPLANT
GOWN STRL REUS W/ TWL XL LVL3 (GOWN DISPOSABLE) IMPLANT
GOWN STRL REUS W/TWL LRG LVL3 (GOWN DISPOSABLE) ×9
GOWN STRL REUS W/TWL XL LVL3 (GOWN DISPOSABLE) ×3
GRAFT BALLN CATH 65CM (STENTS) IMPLANT
GRAFT EXCLUDER LEG 14.5X12 (Endovascular Graft) ×1 IMPLANT
KIT BASIN OR (CUSTOM PROCEDURE TRAY) ×3 IMPLANT
KIT ROOM TURNOVER OR (KITS) ×3 IMPLANT
LIQUID BAND (GAUZE/BANDAGES/DRESSINGS) ×3 IMPLANT
LOOP VESSEL MAXI BLUE (MISCELLANEOUS) ×2 IMPLANT
LOOP VESSEL MINI RED (MISCELLANEOUS) ×1 IMPLANT
NDL PERC 18GX7CM (NEEDLE) ×2 IMPLANT
NEEDLE PERC 18GX7CM (NEEDLE) ×3 IMPLANT
NS IRRIG 1000ML POUR BTL (IV SOLUTION) ×3 IMPLANT
PACK ENDOVASCULAR (PACKS) ×3 IMPLANT
PAD ARMBOARD 7.5X6 YLW CONV (MISCELLANEOUS) ×6 IMPLANT
PATCH VASCULAR VASCU GUARD 1X6 (Vascular Products) ×1 IMPLANT
PERCLOSE PROGLIDE 6F (VASCULAR PRODUCTS) ×24
SHEATH AVANTI 11CM 8FR (MISCELLANEOUS) ×2 IMPLANT
SHEATH BRITE TIP 8FR 23CM (MISCELLANEOUS) ×1 IMPLANT
SHEATH DRYSEAL FLEX 12FR 33CM (SHEATH) IMPLANT
SHEATH DRYSEAL FLEX 18FR 33CM (SHEATH) IMPLANT
SPONGE GAUZE 2X2 STER 10/PKG (GAUZE/BANDAGES/DRESSINGS) ×1
SPONGE SURGIFOAM ABS GEL 100 (HEMOSTASIS) IMPLANT
STAPLER VISISTAT 35W (STAPLE) IMPLANT
STENT GRAFT BALLN CATH 65CM (STENTS) ×1
STOPCOCK MORSE 400PSI 3WAY (MISCELLANEOUS) ×3 IMPLANT
SUT PROLENE 5 0 C 1 24 (SUTURE) ×5 IMPLANT
SUT PROLENE 6 0 CC (SUTURE) ×2 IMPLANT
SUT VIC AB 2-0 CT1 27 (SUTURE) ×3
SUT VIC AB 2-0 CT1 TAPERPNT 27 (SUTURE) IMPLANT
SUT VIC AB 3-0 SH 27 (SUTURE) ×3
SUT VIC AB 3-0 SH 27X BRD (SUTURE) IMPLANT
SUT VICRYL 4-0 PS2 18IN ABS (SUTURE) ×6 IMPLANT
SYR 30ML LL (SYRINGE) ×3 IMPLANT
SYR 3ML LL SCALE MARK (SYRINGE) ×1 IMPLANT
SYR BULB IRRIGATION 50ML (SYRINGE) ×1 IMPLANT
TRAY FOLEY CATH 16FRSI W/METER (SET/KITS/TRAYS/PACK) ×3 IMPLANT
TUBING HIGH PRESSURE 120CM (CONNECTOR) ×3 IMPLANT
WIRE AMPLATZ SS-J .035X180CM (WIRE) ×6 IMPLANT
WIRE BENTSON .035X145CM (WIRE) ×2 IMPLANT

## 2015-04-06 NOTE — Anesthesia Procedure Notes (Signed)
Procedure Name: Intubation Date/Time: 04/06/2015 11:54 AM Performed by: Rogers Blocker Pre-anesthesia Checklist: Patient identified, Emergency Drugs available, Suction available, Patient being monitored and Timeout performed Patient Re-evaluated:Patient Re-evaluated prior to inductionOxygen Delivery Method: Circle system utilized Preoxygenation: Pre-oxygenation with 100% oxygen Intubation Type: IV induction Ventilation: Mask ventilation without difficulty and Oral airway inserted - appropriate to patient size Laryngoscope Size: Mac and 4 Grade View: Grade I Tube type: Oral Tube size: 7.5 mm Number of attempts: 1 Airway Equipment and Method: Stylet Placement Confirmation: ETT inserted through vocal cords under direct vision,  positive ETCO2,  CO2 detector and breath sounds checked- equal and bilateral Secured at: 21 cm Tube secured with: Tape Dental Injury: Teeth and Oropharynx as per pre-operative assessment

## 2015-04-06 NOTE — Progress Notes (Signed)
HD pt, rarely urinates; therefore, unable to obtain ordered urine specimen.

## 2015-04-06 NOTE — Transfer of Care (Signed)
Immediate Anesthesia Transfer of Care Note  Patient: [name]  Procedure(s) Performed: Procedure(s): ABDOMINAL AORTIC ENDOVASCULAR STENT GRAFT (N/A) Right ENDARTERECTOMY FEMORAL (Right) Right Femoral PATCH ANGIOPLASTY (Right)  Patient Location: PACU  Anesthesia Type:General  Level of Consciousness: awake, alert , oriented and patient cooperative  Airway & Oxygen Therapy: Patient Spontanous Breathing and Patient connected to nasal cannula oxygen  Post-op Assessment: Report given to RN, Post -op Vital signs reviewed and stable and Patient moving all extremities  Post vital signs: Reviewed and stable  Last Vitals:  Filed Vitals:   04/06/15 0935  BP: 118/45  Pulse: 64  Temp: 36.1 C  Resp: 18    Complications: No apparent anesthesia complications

## 2015-04-06 NOTE — H&P (View-Only) (Signed)
[facility] [place] HISTORY AND PHYSICAL   History of Present Illness:  Patient is a 81 y.o. year old male who presents for evaluation of abdominal aortic aneurysm. The patient was noted to have infrarenal abdominal aortic aneurysm of 5.8 cm diameter on noncontrast CT scan recently. The patient denies any recent abdominal or back pain. He has no family history of aneurysms. He is a former smoker but quit in 2002. Other medical problems include end-stage renal disease requiring dialysis on Mondays and Fridays. He dialyzes via a left upper arm AV graft. He also has hypertension, coronary artery disease, history of atrial flutter, lower extremity stasis dermatitis with previous bilateral saphenous vein harvesting for coronary bypass, congestive heart failure, hyperlipidemia all of which are currently stable.  Past Medical History  Diagnosis Date  . Hypertension   . Depression   . 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 Coronary artery disease     a. 05/2013 NSTEMI/Cath: LM 90d, LAD 90ost/7m, LCX 90ost, OM1 90p, RCA 100p;  b. 05/2013 CABG x 4: LIMA->LAD, VG->RI->OM, VG->PDA.  [name] Peripheral arterial disease   . Atrial flutter     a. Dx 05/2013-->dilt/coumadin.  [name] DVT (deep venous thrombosis) 02/2011    LLL  . GERD (gastroesophageal reflux disease)   . Compression fracture of cervical spine   . Rheumatoid arthritis   . Prostate cancer   . Stasis dermatitis     LLL, wears TED hose  . AAA (abdominal aortic aneurysm)   . 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 Chronic systolic CHF (congestive heart failure)     EF 45-55% per echo 05/28/13  . Hyperlipidemia   . Dysphagia     a. after prolonged intubation 05/2013; b. 06/2013 refused EDG/Dilatation.  . Myocardial infarction   . Hypothyroidism   . Stroke     denies  . Constipation   . Dysrhythmia   . ESRD on dialysis 06/2013    Mon and Thurs in [place]  . Varicose veins     Past Surgical History  Procedure Laterality Date   . Insertion of dialysis catheter N/A 05/26/2013    Procedure: INSERTION OF DIALYSIS CATHETER;  Surgeon: Elam Dutch, MD;  Location: [place];  Service: Vascular;  Laterality: N/A;  right IJ  . Cardiac surgery    . Cataract extraction    . Eye surgery Left     cataract  . Av fistula placement Left 07/08/2013    Procedure: INSERTION OF ARTERIOVENOUS (AV) GORE-TEX GRAFT ARM;  Surgeon: Elam Dutch, MD;  Location: [place];  Service: Vascular;  Laterality: Left;  . Coronary artery bypass graft N/A 05/13/2013    Procedure: CORONARY ARTERY BYPASS GRAFTING (CABG);  Surgeon: Gaye Pollack, MD;  Location: [place];  Service: Open Heart Surgery;  Laterality: N/A;  . Av fistula placement Left 02/03/2014    Procedure: INSERTION OF ARTERIOVENOUS (AV) GORE-TEX GRAFT USING 4-7 MM X 45 CM STRETCH GORETEX GRAFT;  Surgeon: Elam Dutch, MD;  Location: [place];  Service: Vascular;  Laterality: Left;  . Left heart catheterization with coronary angiogram N/A 05/13/2013    Procedure: LEFT HEART CATHETERIZATION WITH CORONARY ANGIOGRAM;  Surgeon: Burnell Blanks, MD;  Location: [facility] CATH LAB;  Service: Cardiovascular;  Laterality: N/A;    Social History History  Substance Use Topics  . Smoking status: Former Smoker -- 1.00 packs/day for 47 years    Start date: 11/30/1960  Quit date: 11/05/2000  . Smokeless tobacco: Never Used  . Alcohol Use: No     Comment: Previously drank 6 beers/day, quit in 1994    Family History Family History  Problem Relation Age of Onset  . Heart attack Mother     deceased @ 71  . Kidney failure Father     deceased @ 48  . Heart attack Son     deceased @ 57    Allergies  Allergies  Allergen Reactions  . 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 amiodarone (PACERONE) 200 MG tablet Take 1 tablet (200 mg total) by mouth daily. 30 tablet 6  . aspirin EC 325 MG tablet Take 1 tablet  (325 mg total) by mouth daily.    [name] atorvastatin (LIPITOR) 80 MG tablet Take 1 tablet (80 mg total) by mouth daily. 30 tablet 6  . B Complex-C-Folic Acid (DIALYVITE 371) 0.8 MG TABS Take 1 tablet by mouth daily.     . calcium acetate, Phos Binder, (PHOSLYRA) 667 MG/5ML SOLN Take 667 mg by mouth 3 (three) times daily with meals.    [name] diltiazem (CARDIZEM) 30 MG tablet Take 1 tablet (30 mg total) by mouth 2 (two) times daily. 60 tablet 6  . folic acid-vitamin b complex-vitamin c-selenium-zinc (DIALYVITE) 3 MG TABS tablet Take 1 tablet by mouth daily.    . furosemide (LASIX) 40 MG tablet Take 90 mg by mouth 2 (two) times daily.    . hydrOXYzine (ATARAX/VISTARIL) 25 MG tablet Take 1 tablet (25 mg total) by mouth 3 (three) times daily as needed for itching. 90 tablet 0  . levothyroxine (SYNTHROID, LEVOTHROID) 100 MCG tablet Take 1 tablet (100 mcg total) by mouth daily before breakfast. (Patient taking differently: Take 125 mcg by mouth daily before breakfast. ) 30 tablet 0  . meclizine (ANTIVERT) 25 MG tablet Take 25 mg by mouth 2 (two) times daily as needed for dizziness.     . ondansetron (ZOFRAN) 4 MG tablet Take 4 mg by mouth every 8 (eight) hours as needed.     . pantoprazole (PROTONIX) 40 MG tablet Take 40 mg by mouth 2 (two) times daily.    [name] acetaminophen (TYLENOL) 325 MG tablet Take 1-2 tablets (325-650 mg total) by mouth every 6 (six) hours as needed for pain. (Patient not taking: Reported on 03/24/2015)    . amoxicillin (AMOXIL) 500 MG capsule     . atorvastatin (LIPITOR) 20 MG tablet     . calcium acetate (PHOSLO) 667 MG capsule Take 667 mg by mouth. Take 8 daily    . ondansetron (ZOFRAN) 8 MG tablet Take 4 mg by mouth every 8 (eight) hours as needed for nausea.    . predniSONE (DELTASONE) 10 MG tablet      No current facility-administered medications for this visit.    ROS:   General:  No weight loss, Fever, chills  HEENT: No recent headaches, no nasal bleeding, no visual changes, no  sore throat  Neurologic: No dizziness, blackouts, seizures. No recent symptoms of stroke or mini- stroke. No recent episodes of slurred speech, or temporary blindness.  Cardiac: No recent episodes of chest pain/pressure, no shortness of breath at rest.  + shortness of breath with exertion.  Denies history of atrial fibrillation or irregular heartbeat  Vascular: No history of rest pain in feet.  No history of claudication.  No history of  non-healing ulcer, No history of DVT   Pulmonary: No home oxygen, no productive cough, no hemoptysis,  No asthma or wheezing  Musculoskeletal:  [ ]  Arthritis, [ ]  Low back pain,  [ ]  Joint pain  Hematologic:No history of hypercoagulable state.  No history of easy bleeding.  No history of anemia  Gastrointestinal: No hematochezia or melena,  No gastroesophageal reflux, no trouble swallowing  Urinary: [ ]  chronic Kidney disease, [x ] on HD - [ ]  MWF or [ ]  TTHS, [ ]  Burning with urination, [ ]  Frequent urination, [ ]  Difficulty urinating;   Skin: No rashes  Psychological: No history of anxiety,  No history of depression   Physical Examination  Filed Vitals:   03/24/15 0831  BP: 129/59  Pulse: 65  Height: 6\' 1"  (1.854 m)  Weight: 190 lb 4.8 oz (86.32 kg)  SpO2: 100%    Body mass index is 25.11 kg/(m^2).  General:  Alert and oriented, no acute distress HEENT: Normal Neck: No bruit or JVD Pulmonary: Clear to auscultation bilaterally Cardiac: Regular Rate and Rhythm without murmur Abdomen: Soft, non-tender, non-distended, vaguely palpable pulsatile epigastric mass Skin: Diffuse circumferential erythema with scaly skin left leg Extremity Pulses:  2+ radial, brachial, femoral, absent right dorsalis pedis, 2+ right posterior tibial pulses absent pedal pulses left foot  Musculoskeletal: No deformity bilateral pretibial edema F greater than right  Neurologic: Upper and lower extremity motor 5/5 and symmetric  DATA:  A noncontrast CT scan from  [facility] was reviewed today. This shows a 5.8 centimeter abdominal aortic aneurysm which is infrarenal in character. The neck right at the level of the renal arteries is of acceptable diameter for stent graft. However the cuts are 5 mm in length. The neck does flare out but this may be better defined on a fine cut CT scan. It is also difficult to identify the renal origins and iliac artery diameters based on this noncontrast CT. There is no evidence of iliac artery aneurysm.   ASSESSMENT:  5.8 cm infrarenal abdominal aortic aneurysm. This is currently asymptomatic but is of a size that repair should be considered. Patient also has evidence of peripheral arterial disease with a abnormal clinical pulse exam.   PLAN:  #1 CT angiogram abdomen and pelvis with runoff to evaluate aneurysm fully for stent graft repair as well as his lower extremity arterial tree #2 patient needs cardiac risk stratification preoperatively. #3 chronic left leg swelling with changes consistent with venous reflux disease patient currently has a treatment plan applying Vaseline to the scaly skin and occasionally wearing intermittent compression.  Aneurysm repair is tentatively scheduled for June 1 to accommodate the patient's dialysis schedule and the son's travel schedule.  Ruta Hinds, MD [facility] [place] Office: [phone] Pager: [phone]

## 2015-04-06 NOTE — Anesthesia Preprocedure Evaluation (Addendum)
Anesthesia Evaluation  Patient identified by MRN, date of birth, ID band Patient awake    Reviewed: Allergy & Precautions, NPO status , Patient's Chart, lab work & pertinent test results  Airway Mallampati: II  TM Distance: >3 FB Neck ROM: Full    Dental   Pulmonary former smoker,  breath sounds clear to auscultation        Cardiovascular hypertension, + CAD, + Past MI, + CABG, + Peripheral Vascular Disease and +CHF Rhythm:Regular Rate:Normal  Echo 05/31/2013: - Left ventricle: Septal and mid and basal inferior wall hypokinesis The cavity size was mildly dilated. Wallthickness was normal. Systolic function was mildly reduced. The estimated ejection fraction was in the range of 45% to 50%. - Left atrium: The atrium was mildly dilated. - Impressions: Overall image quality for study is poor   Neuro/Psych CVA    GI/Hepatic Neg liver ROS, GERD-  ,  Endo/Other  Hypothyroidism   Renal/GU ESRF and DialysisRenal disease     Musculoskeletal  (+) Arthritis -,   Abdominal   Peds  Hematology  (+) anemia ,   Anesthesia Other Findings   Reproductive/Obstetrics                            Lab Results  Component Value Date   CREATININE 5.53* 04/01/2015   BUN 29* 04/01/2015   NA 141 04/06/2015   K 3.9 04/06/2015   CL 101 04/01/2015   CO2 27 04/01/2015   Lab Results  Component Value Date   WBC 7.2 04/01/2015   HGB 10.5* 04/06/2015   HCT 31.0* 04/06/2015   MCV 86.9 04/01/2015   PLT 202 04/01/2015    Anesthesia Physical Anesthesia Plan  ASA: III  Anesthesia Plan: General   Post-op Pain Management:    Induction: Intravenous  Airway Management Planned: Oral ETT  Additional Equipment: Arterial line  Intra-op Plan:   Post-operative Plan: Extubation in OR  Informed Consent: I have reviewed the patients History and Physical, chart, labs and discussed the procedure including the risks, benefits  and alternatives for the proposed anesthesia with the patient or authorized representative who has indicated his/her understanding and acceptance.   Dental advisory given  Plan Discussed with: CRNA  Anesthesia Plan Comments:         Anesthesia Quick Evaluation

## 2015-04-06 NOTE — Op Note (Signed)
Procedure: Gore excluder stent graft repair of infrarenal AAA, right femoral endarterectomy  Preop: AAA  Postop: same  Anesthesia: General  Asst: Gerri Lins PA-C  Operative findings:   #1 Left leg proglide closure   #2 28x14x16 cm main body Gore Excluder device delivered via a right femoral system   #3 14 x 12 cm left iliac limb contralateral    #4 bovine pericardial patch right common femoral artery  PROCEDURE DETAIL: After obtaining informed consent the patient was taken to the operating room. The patient was placed in supine position on the operating room table. After induction of general anesthesia and endotracheal intubation a Foley catheter was placed. Next the patient was prepped and draped in usual sterile fashion from the nipples down to the knees. Ultrasound was used to identify the right common femoral artery as well as the femoral bifurcation. An 11 blade was used to make a small neck in the skin over the level of the right common femoral artery. An introducer needle was then used to cannulate the right common femoral artery without difficulty. A 0.035 Bentson wire was then threaded up into the abdominal aorta through the right femoral system. A short 9 French dilator was placed over the guidewire the right femoral system. This was used to dilate the tract. The dilator was then removed and a Proglide device inserted over the guidewire into the right femoral system and this was deployed at the 2:00 position. The Proglide device was then removed and an additional Proglide device was brought in operative field and deployed at the 10:00 position. The sutures were kept separate and tagged with suture tags. Next the short 9 French sheath was then placed back over the guidewire into the right femoral system and the dilator removed and the sheath thoroughly flushed with heparinized saline. Attention was then turned to the left groin. Again using ultrasound the left common femoral artery  was identified. The femoral bifurcation was also identified. A small nick was made in the skin with the 11 blade. A hemostat was used to dilate a tract down to the artery. An introducer needle was then used to cannulate the left common femoral artery without difficulty. A 0.035 Bentson wire was then threaded up into the abdominal aorta under fluoroscopic guidance. A 9 French dilator was then placed over the wire to dilate the tract. Two Proglide devices were again brought on operative field and these were fired sequentially in the 10:00 position followed by an additional Proglide at the 2:00 position. The Proglide delivery systems were removed and the long 9 French sheath placed back over the guidewire up to the level of the iliac of the aortic bifurcation.  At this point, a 5 [place] Omni flush catheter was placed over the guidewire into the left groin up into the abdominal aorta. An abdominal aortogram was obtained in the 15 degree cranial caudal position to determine level of the left and right renal arteries. At this point a 28 x 14 x 16 Gore Excluder main body device was selected.   The Bentson wire from the right groin was advanced up into the descending thoracic aorta over a Kumpe catheter and the Bentsen wire replaced with an 035 Amplatz wire. The small sheath in the right groin was exchanged over the Amplatz for an 18 Fr Gore dry seal sheath.The main body device was then placed over the Amplatz wire in the right groin and advanced up to the level of the renal arteries.  Magnified views of  the renal arteries were performed to make sure that we were not covering these. The top portion of the stent graft was then deployed with the end of the stent just below the level of the left renal artery and this came over to just below the level of the right renal artery. The main body was delivered all the way down to the contralateral gate. Attention was then turned to the left groin. The Omni flush catheter was  pulled down over a guidewire and removed and the Bentson wire placed in position to cannulate the contralateral gate. The Berenstein 2 catheter was placed back over the guidewire in the left femoral system.  This was used to selectively catheterize the contralateral gate and the guidewire was then advanced into the descending thoracic aorta. The main body portion of the gate was confirmed by twirling the pigtail catheter. The Omniflush catheter was then placed in a location so that we could use its markers to determine the exact length to the iliac bifurcation. An Amplatz wire was placed back through the pigtail catheter. A retrograde contrast angiogram was performed to determine the level of the left internal iliac artery and a 14 x 12 cm iliac limb was selected. The pigtail catheter was removed over the guidewire and a 12 Fr dry seal sheath placed over the wire and the 14 x 12 cm limb advanced through it so there was full coverage of the long marker on the contralateral limb. This was then deployed in the usual fashion down to the iliac bifurcation. The delivery system was then removed. The remainder of the ipsilateral iliac limb was also deployed.  Attention was then turned back to the left iliac system and a Gore aortic balloon placed over the wire up to the level of the proximal end of the stent and this was ballooned to profile. The limb attachment site was also ballooned as well as the distal attachment site. Attention was then turned to the right groin and the balloon was advanced over the guidewire on the right side and the distal attachment site as well as the midportion of iliac limb were also ballooned. The 5 [place] Omni flush catheter was then placed back to the guidewire on the right side and a completion arteriogram was obtained. This showed no evidence of proximal or distal type I endoleak. There was a small type II endoleak from lumbar collaterals.   At this point the Omni flush catheter was  removed over a guidewire. All delivery devices were removed. We then proceeded to remove the large 18 French sheath from the right side with the guidewire in place. With pressure held above and below the insertion site the lateral Proglide closure was secured down.  2 additional AP Proglides were then placed over the guidewire and deployed.  There was no hemostasis. A sheath was placed back over the wire to obtain hemostasis while the left groin was addressed. The guidewire was removed from the right side. Attention was then turned to the left groin. In similar fashion the 12 French sheath was removed and the guidewire left in place.  The 2 proglides were secured and there was good hemostasis and the Bentsen wire was removed from the left groin. The left groin puncture site was then closed with a running 4-0 Vicryl subcuticular stitch.  Attention was then turned back to the right groin.  An oblique incision was made in the right groin incorporating the right femoral sheath. Incision was carried down through the  subcutaneous tissues down to level the right common femoral artery. This was severely calcified on the posterior wall. The artery was dissected free circumferentially up underneath the inguinal ligament and the circumflex iliac branches dissected free as well. Vessel loops were placed around all these. Dissection was then carried out the level of the femoral bifurcation. The superficial femoral and profunda femoris arteries dissected free circumferentially and vessel loops were placed around these. The patient was given an additional 5000 units of heparin. A longitudinal opening was made in the common femoral artery after removing the sheath. There is a large amount of calcified plaque and the Proglide's had lifted some of this plaque posteriorly causing obstruction. There was also no securing of the arteriotomy. The Proglide sutures were completely removed. The vessel was so calcified that I did not think  this could be safely closed without an endarterectomy. A common femoral endarterectomy was performed extending from the inguinal ligament all the way down to the femoral bifurcation. A reasonable endpoint was obtained. However there was some step-off. This was tacked posteriorly with several 5 0 prolene sutures. A bovine pericardial patch was then brought up on the operative field and this was sewn on as a patch angioplasty using a running 6 0 prolene suture. Just prior to completion of the anastomosis, it was forebled backbled and thoroughly flushed. Anastomosis was secured clamps released and there was a good pulse in the common femoral artery immediately. There was good Doppler flow in the profunda and superficial femoral arteries. There is also good Doppler flow in both feet at this point. Hemostasis was obtained. The groin was closed in multiple layers on the right side with running 2 0 and 3-0 vicryl suture and a 4 0 vicryl subcuticular stitch in the skin.  The patient tolerated procedure well and there were no complications. Instrument sponge and needle counts correct in the case. Patient was awakened in the operating room extubated and taken to the recovery room in stable condition.  Ruta Hinds, MD [facility] Office: [phone] Pager: [phone]

## 2015-04-06 NOTE — Anesthesia Postprocedure Evaluation (Signed)
  Anesthesia Post-op Note  Patient: [name]  Procedure(s) Performed: Procedure(s): ABDOMINAL AORTIC ENDOVASCULAR STENT GRAFT (N/A) Right ENDARTERECTOMY FEMORAL (Right) Right Femoral PATCH ANGIOPLASTY (Right)  Patient Location: PACU  Anesthesia Type: General   Level of Consciousness: awake, alert  and oriented  Airway and Oxygen Therapy: Patient Spontanous Breathing  Post-op Pain: none  Post-op Assessment: Post-op Vital signs reviewed  Post-op Vital Signs: Reviewed  Last Vitals:  Filed Vitals:   04/06/15 1515  BP:   Pulse:   Temp: 36.4 C  Resp:     Complications: No apparent anesthesia complications

## 2015-04-06 NOTE — Consult Note (Signed)
Reason for Consult: Continuity of ESRD care Referring Physician: Ruta Hinds M.D. (vascular surgery)  HPI:  81 year old Caucasian man with past medical history significant for coronary artery disease, history of iliac artery dissection, hypertension and end-stage renal disease who gets dialysis on Mondays and Fridays in [place], [place].   Admitted today for management of his abdominal aortic aneurysm that was treated with an endovascular stent graft and right femoral endarterectomy that he underwent this afternoon and was seen in the PACU. Currently, he complains of some nausea and pain from recent cannulation sites-he denies any chest pain or shortness of breath. He denies any leg pain.  I have contacted his dialysis center for further details regarding his dialysis prescription.  Past Medical History  Diagnosis Date  . Hypertension   . 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 Coronary artery disease     a. 05/2013 NSTEMI/Cath: LM 90d, LAD 90ost/67m, LCX 90ost, OM1 90p, RCA 100p;  b. 05/2013 CABG x 4: LIMA->LAD, VG->RI->OM, VG->PDA.  [name] Atrial flutter     a. Dx 05/2013-->dilt/coumadin.  [name] DVT (deep venous thrombosis) 02/2011    LLL,  as of 2016 pt. reports the DVT is resolved  . GERD (gastroesophageal reflux disease)   . Compression fracture of cervical spine   . Stasis dermatitis     LLL, wears TED hose  . Chronic systolic CHF (congestive heart failure)     EF 45-55% per echo 05/28/13  . Hyperlipidemia   . Dysphagia     a. after prolonged intubation 05/2013; b. 06/2013 refused EDG/Dilatation.  . Myocardial infarction   . Hypothyroidism   . Stroke     denies  . Constipation   . Dysrhythmia   . Varicose veins   . Peripheral arterial disease   . AAA (abdominal aortic aneurysm)   . 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 Depression     pt. reports that he doesn't take any med. for "sadness", but he admits to depression    . ESRD on dialysis 06/2013    Mon and Fri in [place]  . Rheumatoid  arthritis     stopped methotrexate 2 yrs. ago  . Prostate cancer     pt. reports that he was on a table for 30 days, states "this things went around my body" for 3 days. Pt. reports that he doesn't believe he even had prostate ca     Past Surgical History  Procedure Laterality Date  . Insertion of dialysis catheter N/A 05/26/2013    Procedure: INSERTION OF DIALYSIS CATHETER;  Surgeon: Elam Dutch, MD;  Location: [place];  Service: Vascular;  Laterality: N/A;  right IJ  . Cardiac surgery    . Cataract extraction    . Av fistula placement Left 07/08/2013    Procedure: INSERTION OF ARTERIOVENOUS (AV) GORE-TEX GRAFT ARM;  Surgeon: Elam Dutch, MD;  Location: [place];  Service: Vascular;  Laterality: Left;  . Coronary artery bypass graft N/A 05/13/2013    Procedure: CORONARY ARTERY BYPASS GRAFTING (CABG);  Surgeon: Gaye Pollack, MD;  Location: [place];  Service: Open Heart Surgery;  Laterality: N/A;  . Av fistula placement Left 02/03/2014    Procedure: INSERTION OF ARTERIOVENOUS (AV) GORE-TEX GRAFT USING 4-7 MM X 45 CM STRETCH GORETEX GRAFT;  Surgeon: Elam Dutch, MD;  Location: [facility];  Service: Vascular;  Laterality: Left;  . Left heart catheterization with coronary angiogram N/A 05/13/2013  Procedure: LEFT HEART CATHETERIZATION WITH CORONARY ANGIOGRAM;  Surgeon: Burnell Blanks, MD;  Location: [facility] CATH LAB;  Service: Cardiovascular;  Laterality: N/A;  . Eye surgery Bilateral     cataract removed, ? IOL    Family History  Problem Relation Age of Onset  . Heart attack Mother     deceased @ 30  . Kidney failure Father     deceased @ 62  . Heart attack Son     deceased @ 82    Social History:  reports that he quit smoking about 14 years ago. His smoking use included Cigarettes. He started smoking about 54 years ago. He has a 47 pack-year smoking history. He has never used smokeless tobacco. He reports that he does not drink alcohol or use illicit drugs.  Allergies:  Allergies   Allergen Reactions  . 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 cefUROXime (ZINACEF)  IV  1.5 g Intravenous To OR  . Chlorhexidine Gluconate Cloth  6 each Topical Once   And  . Chlorhexidine Gluconate Cloth  6 each Topical Once  . promethazine        Results for orders placed or performed during the hospital encounter of 04/06/15 (from the past 48 hour(s))  I-STAT 4, (NA,K, GLUC, HGB,HCT)     Status: Abnormal   Collection Time: 04/06/15 10:11 AM  Result Value Ref Range   Sodium 141 135 - 145 mmol/L   Potassium 3.9 3.5 - 5.1 mmol/L   Glucose, Bld 80 65 - 99 mg/dL   HCT 31.0 (L) 39.0 - 52.0 %   Hemoglobin 10.5 (L) 13.0 - 17.0 g/dL  APTT     Status: None   Collection Time: 04/06/15 10:13 AM  Result Value Ref Range   aPTT 35 24 - 37 seconds  Protime-INR     Status: Abnormal   Collection Time: 04/06/15 10:13 AM  Result Value Ref Range   Prothrombin Time 15.6 (H) 11.6 - 15.2 seconds   INR 1.23 0.00 - 1.49    No results found.  Review of Systems  Constitutional: Negative.   HENT: Negative.   Eyes: Negative.   Respiratory: Negative.   Cardiovascular: Negative.   Gastrointestinal: Positive for nausea. Negative for heartburn, vomiting and abdominal pain.  Genitourinary: Negative.   Musculoskeletal: Negative.   Skin: Negative.    Blood pressure 118/45, pulse 64, temperature 97.5 F (36.4 C), temperature source Oral, resp. rate 18, height 6\' 1"  (1.854 m), weight 84.794 kg (186 lb 15 oz), SpO2 96 %. Physical Exam  Nursing note and vitals reviewed. Constitutional: He appears well-developed and well-nourished.  Appears uncomfortable postoperatively with recent retching  HENT:  Head: Normocephalic and atraumatic.  Nose: Nose normal.  Eyes: EOM are normal. Pupils are equal, round, and reactive to light. No scleral icterus.  Neck: Normal range of motion. Neck supple. No JVD present.  Cardiovascular: Normal rate and regular rhythm.    No murmur heard. Respiratory: Effort normal and breath sounds normal. He has no wheezes. He has no rales.  Musculoskeletal: He exhibits no edema.  Neurological: He is alert.  Skin: Skin is warm and dry. No erythema.    Assessment/Plan: 1. Status post management of AAA with endovascular stent graft with right femoral endarterectomy: Seen postoperatively and appearing to do well-lower extremity Dopplers with satisfactory pulses. 2. End-stage renal disease: We will await orders from his outpatient dialysis unit and monitor daily labs for emergent  dialysis needs otherwise continue Monday/Friday dialysis schedule-volume status appears permissive to do this. 3. Anemia of chronic kidney disease: Hemoglobin/hematocrit preoperatively 10.5/31-will recheck tomorrow a.m. with labs and restart ESA. 4. Metabolic bone disease: Resume phosphorus binders when able to fully eat and restart VDRA for PTH suppression 5. Hypertension: Blood pressures appear to be currently well controlled in PACU, continue to monitor and reconcile outpatient antihypertensives therapy  Luciann Gossett K. 04/06/2015, 3:57 PM

## 2015-04-06 NOTE — Progress Notes (Signed)
CRITICAL VALUE ALERT  Critical value received:  PTT >200  Date of notification:  04/06/2015  Time of notification:  1216  Critical value read back:Yes.    Nurse who received alert:  Merlene Laughter  MD notified (1st page):  Dr. Donnetta Hutching  Time of first page:  1655  MD notified (2nd page):  Time of second page:  Responding MD:  Dr. Donnetta Hutching  Time MD responded:  1655  Primary nurse Melvenia Needles and Dr. Donnetta Hutching called and notified, no new orders at this time.

## 2015-04-06 NOTE — Progress Notes (Signed)
  [facility] Progress Note  04/06/2015 7:16 PM Day of Surgery  No complaints at this time.   Right groin incision clean and intact, soft, no hematoma. Left groin sheath site soft, no hematoma. Doppler signals bilateral DPs.    Virgina Jock, PA-C [facility] Office: [phone] Pager: [phone] 04/06/2015 7:16 PM

## 2015-04-06 NOTE — Interval H&P Note (Signed)
History and Physical Interval Note:  04/06/2015 11:15 AM  [name]  has presented today for surgery, with the diagnosis of Abdominal aortic aneurysm I71.4  The various methods of treatment have been discussed with the patient and family. After consideration of risks, benefits and other options for treatment, the patient has consented to  Procedure(s): ABDOMINAL AORTIC ENDOVASCULAR STENT GRAFT (N/A) as a surgical intervention .  The patient's history has been reviewed, patient examined, no change in status, stable for surgery.  I have reviewed the patient's chart and labs.  Questions were answered to the patient's satisfaction.     Ruta Hinds

## 2015-04-07 ENCOUNTER — Ambulatory Visit: Payer: Medicare Other | Admitting: Cardiology

## 2015-04-07 ENCOUNTER — Other Ambulatory Visit: Payer: Self-pay | Admitting: *Deleted

## 2015-04-07 ENCOUNTER — Encounter (HOSPITAL_COMMUNITY): Payer: Self-pay | Admitting: Vascular Surgery

## 2015-04-07 DIAGNOSIS — I714 Abdominal aortic aneurysm, without rupture, unspecified: Secondary | ICD-10-CM

## 2015-04-07 DIAGNOSIS — Z48812 Encounter for surgical aftercare following surgery on the circulatory system: Secondary | ICD-10-CM

## 2015-04-07 LAB — FERRITIN: FERRITIN: 747 ng/mL — AB (ref 24–336)

## 2015-04-07 LAB — CBC
HEMATOCRIT: 28.2 % — AB (ref 39.0–52.0)
HEMOGLOBIN: 8.5 g/dL — AB (ref 13.0–17.0)
MCH: 26 pg (ref 26.0–34.0)
MCHC: 30.1 g/dL (ref 30.0–36.0)
MCV: 86.2 fL (ref 78.0–100.0)
Platelets: 179 10*3/uL (ref 150–400)
RBC: 3.27 MIL/uL — ABNORMAL LOW (ref 4.22–5.81)
RDW: 18.9 % — AB (ref 11.5–15.5)
WBC: 7.1 10*3/uL (ref 4.0–10.5)

## 2015-04-07 LAB — RENAL FUNCTION PANEL
Albumin: 2 g/dL — ABNORMAL LOW (ref 3.5–5.0)
Anion gap: 11 (ref 5–15)
BUN: 36 mg/dL — AB (ref 6–20)
CHLORIDE: 101 mmol/L (ref 101–111)
CO2: 24 mmol/L (ref 22–32)
Calcium: 7.4 mg/dL — ABNORMAL LOW (ref 8.9–10.3)
Creatinine, Ser: 7.65 mg/dL — ABNORMAL HIGH (ref 0.61–1.24)
GFR calc Af Amer: 7 mL/min — ABNORMAL LOW (ref >60)
GFR calc non Af Amer: 6 mL/min — ABNORMAL LOW (ref >60)
GLUCOSE: 75 mg/dL (ref 65–99)
PHOSPHORUS: 5.4 mg/dL — AB (ref 2.5–4.6)
POTASSIUM: 4.5 mmol/L (ref 3.5–5.1)
Sodium: 136 mmol/L (ref 135–145)

## 2015-04-07 LAB — IRON AND TIBC
Iron: 18 ug/dL — ABNORMAL LOW (ref 45–182)
SATURATION RATIOS: 13 % — AB (ref 17.9–39.5)
TIBC: 139 ug/dL — ABNORMAL LOW (ref 250–450)
UIBC: 121 ug/dL

## 2015-04-07 MED ORDER — TRAMADOL HCL 50 MG PO TABS: 50.0000 mg | ORAL_TABLET | Freq: Three times a day (TID) | ORAL | Status: AC | PRN

## 2015-04-07 NOTE — Progress Notes (Signed)
Discussed discharge instructions and prescription with pt, wife, and son. All verbalize understanding. VSS, all personal belongings with pt. Pt has not voided this a.m., but he is oliguric and on dialysis. Bladder scan shows 58mL. Aldona Bar, [place] notified, says he is okay for discharge. Pt discharged to home.

## 2015-04-07 NOTE — Progress Notes (Signed)
Patient oxygen saturations dropping down 84% on room air while asleep.Patient awaken asked to take slow deep breath and oxygen level up 92%.Oxygen placed at 2 lnc for rest of night to maintain oxygen saturations greater than 92%.

## 2015-04-07 NOTE — Progress Notes (Addendum)
  Progress Note    04/07/2015 7:43 AM 1 Day Post-Op  Subjective:  C/o soreness in his groins  Tm 99.1 96% RA (pt did desat to 80's on RA while sleeping last night)  Filed Vitals:   04/07/15 0400  BP:   Pulse:   Temp: 99.1 F (37.3 C)  Resp:     Physical Exam: Cardiac:  regular Lungs:  Non labored Incisions:  Right groin incision is c/d/i; left groin is soft without hematoma Extremities:  Audible doppler signals bilateral PT/DP Abdomen:  Soft, NT/ND  CBC    Component Value Date/Time   WBC 7.1 04/07/2015 0441   RBC 3.27* 04/07/2015 0441   HGB 8.5* 04/07/2015 0441   HCT 28.2* 04/07/2015 0441   PLT 179 04/07/2015 0441   MCV 86.2 04/07/2015 0441   MCH 26.0 04/07/2015 0441   MCHC 30.1 04/07/2015 0441   RDW 18.9* 04/07/2015 0441   LYMPHSABS 3.2 04/10/2014 1517   MONOABS 0.9 04/10/2014 1517   EOSABS 0.2 04/10/2014 1517   BASOSABS 0.0 04/10/2014 1517    BMET    Component Value Date/Time   NA 136 04/07/2015 0430   K 4.5 04/07/2015 0430   CL 101 04/07/2015 0430   CO2 24 04/07/2015 0430   GLUCOSE 75 04/07/2015 0430   BUN 36* 04/07/2015 0430   CREATININE 7.65* 04/07/2015 0430   CALCIUM 7.4* 04/07/2015 0430   GFRNONAA 6* 04/07/2015 0430   GFRAA 7* 04/07/2015 0430    INR    Component Value Date/Time   INR 1.32 04/06/2015 1937   INR 3.7 04/20/2014 1016     Intake/Output Summary (Last 24 hours) at 04/07/15 0743 Last data filed at 04/07/15 0000  Gross per 24 hour  Intake    400 ml  Output    115 ml  Net    285 ml     Assessment:  81 y.o. male is s/p:  Gore excluder stent graft repair of infrarenal AAA, right femoral endarterectomy   1 Day Post-Op  Plan: -pt doing well this am.  He did drop his O2 sats while sleeping to the 80's.  He is not on home O2.  Will get him an incentive spirometer to use.  Encouraged pt to use this at home as well. -home later today if sats ok -DVT prophylaxis:  Lovenox -f/u with Dr. Oneida Alar in 4 weeks with CTA  protocol   Leontine Locket, PA-C [facility] [phone] 04/07/2015 7:43 AM    Agree with above. Incisions clean no hematoma D/c home  Ruta Hinds, MD [facility] [place] Office: [phone] Pager: [phone]

## 2015-04-07 NOTE — Progress Notes (Signed)
Right radial aline discontinued this morning pressure held for15 minutes and pressure dressing applied.

## 2015-04-07 NOTE — Discharge Summary (Signed)
EVAR Discharge Summary   [name] 02-Dec-1933 81 y.o. male  MRN: [id number]  Admission Date: 04/06/2015  Discharge Date: 04/07/15  Physician: Elam Dutch, MD  Admission Diagnosis: Abdominal aortic aneurysm I71.4   HPI:   This is a 81 y.o. male who presents for evaluation of abdominal aortic aneurysm. The patient was noted to have infrarenal abdominal aortic aneurysm of 5.8 cm diameter on noncontrast CT scan recently. The patient denies any recent abdominal or back pain. He has no family history of aneurysms. He is a former smoker but quit in 2002. Other medical problems include end-stage renal disease requiring dialysis on Mondays and Fridays. He dialyzes via a left upper arm AV graft. He also has hypertension, coronary artery disease, history of atrial flutter, lower extremity stasis dermatitis with previous bilateral saphenous vein harvesting for coronary bypass, congestive heart failure, hyperlipidemia all of which are currently stable.  Hospital Course:  The patient was admitted to the hospital and taken to the operating room on 04/06/2015 and underwent: EVAR    The pt tolerated the procedure well and was transported to the PACU in good condition.   By POD 1, he was doing well.  He had dropped his O2 sats during the night, but these did improve.  He was discharged later that day.  The remainder of the hospital course consisted of increasing mobilization and increasing intake of solids without difficulty.  CBC    Component Value Date/Time   WBC 7.1 04/07/2015 0441   RBC 3.27* 04/07/2015 0441   HGB 8.5* 04/07/2015 0441   HCT 28.2* 04/07/2015 0441   PLT 179 04/07/2015 0441   MCV 86.2 04/07/2015 0441   MCH 26.0 04/07/2015 0441   MCHC 30.1 04/07/2015 0441   RDW 18.9* 04/07/2015 0441   LYMPHSABS 3.2 04/10/2014 1517   MONOABS 0.9 04/10/2014 1517   EOSABS 0.2 04/10/2014 1517   BASOSABS 0.0 04/10/2014 1517    BMET    Component Value Date/Time   NA 136  04/07/2015 0430   K 4.5 04/07/2015 0430   CL 101 04/07/2015 0430   CO2 24 04/07/2015 0430   GLUCOSE 75 04/07/2015 0430   BUN 36* 04/07/2015 0430   CREATININE 7.65* 04/07/2015 0430   CALCIUM 7.4* 04/07/2015 0430   GFRNONAA 6* 04/07/2015 0430   GFRAA 7* 04/07/2015 0430       Discharge Instructions    ABDOMINAL PROCEDURE/ANEURYSM REPAIR/AORTO-BIFEMORAL BYPASS:  Call MD for increased abdominal pain; cramping diarrhea; nausea/vomiting    Complete by:  As directed      Call MD for:  redness, tenderness, or signs of infection (pain, swelling, bleeding, redness, odor or green/yellow discharge around incision site)    Complete by:  As directed      Call MD for:  severe or increased pain, loss or decreased feeling  in affected limb(s)    Complete by:  As directed      Call MD for:  temperature >100.5    Complete by:  As directed      Driving Restrictions    Complete by:  As directed   No driving for 2 weeks     Lifting restrictions    Complete by:  As directed   No lifting for 4 weeks     Resume previous diet    Complete by:  As directed      may wash over wound with mild soap and water    Complete by:  As directed   Wash the groin  wound with soap and water daily and pat dry. (No tub bath-only shower)  Then put a dry gauze or washcloth there to keep this area dry daily and as needed.  Do not use Vaseline or neosporin on your incisions.  Only use soap and water on your incisions and then protect and keep dry.           Discharge Diagnosis:  Abdominal aortic aneurysm I71.4  Secondary Diagnosis: Patient Active Problem List   Diagnosis Date Noted  . AAA (abdominal aortic aneurysm) 04/06/2015  . 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 Chronic venous insufficiency 12/15/2014  . End stage renal disease 01/26/2014  . Encounter for therapeutic drug monitoring 12/11/2013  . Atrial flutter 07/03/2013  . Uncontrolled atrial flutter 07/02/2013  . Dysphagia 06/30/2013  . Long term  (current) use of anticoagulants 06/19/2013  . ESRD on dialysis 06/15/2013  . Malnutrition of moderate degree 06/14/2013  . PAF (paroxysmal atrial fibrillation) 06/12/2013  . Anemia 06/12/2013  . Physical deconditioning 06/04/2013  . S/P CABG x 4 06/02/2013  . Acute on chronic combined systolic and diastolic CHF, NYHA class 4 05/13/2013  . NSTEMI (non-ST elevated myocardial infarction) 05/13/2013   Past Medical History  Diagnosis Date  . Hypertension   . 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 Coronary artery disease     a. 05/2013 NSTEMI/Cath: LM 90d, LAD 90ost/62m, LCX 90ost, OM1 90p, RCA 100p;  b. 05/2013 CABG x 4: LIMA->LAD, VG->RI->OM, VG->PDA.  [name] Atrial flutter     a. Dx 05/2013-->dilt/coumadin.  [name] DVT (deep venous thrombosis) 02/2011    LLL,  as of 2016 pt. reports the DVT is resolved  . GERD (gastroesophageal reflux disease)   . Compression fracture of cervical spine   . Stasis dermatitis     LLL, wears TED hose  . Chronic systolic CHF (congestive heart failure)     EF 45-55% per echo 05/28/13  . Hyperlipidemia   . Dysphagia     a. after prolonged intubation 05/2013; b. 06/2013 refused EDG/Dilatation.  . Myocardial infarction   . Hypothyroidism   . Stroke     denies  . Constipation   . Dysrhythmia   . Varicose veins   . Peripheral arterial disease   . AAA (abdominal aortic aneurysm)   . 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 Depression     pt. reports that he doesn't take any med. for "sadness", but he admits to depression    . ESRD on dialysis 06/2013    Mon and Fri in [place]  . Rheumatoid arthritis     stopped methotrexate 2 yrs. ago  . Prostate cancer     pt. reports that he was on a table for 30 days, states "this things went around my body" for 3 days. Pt. reports that he doesn't believe he even had prostate ca        Medication List    TAKE these medications        acetaminophen 325 MG tablet  Commonly known as:  TYLENOL  Take 1-2 tablets (325-650 mg  total) by mouth every 6 (six) hours as needed for pain.     amiodarone 200 MG tablet  Commonly known as:  PACERONE  Take 1 tablet (200 mg total) by mouth daily.     aspirin EC 325 MG tablet  Take 1 tablet (325 mg total) by mouth daily.     atorvastatin 80 MG tablet  Commonly  known as:  LIPITOR  Take 1 tablet (80 mg total) by mouth daily.     DIALYVITE 800 0.8 MG Tabs  Take 1 tablet by mouth every evening.     diltiazem 30 MG tablet  Commonly known as:  CARDIZEM  Take 1 tablet (30 mg total) by mouth 2 (two) times daily.     hydrOXYzine 25 MG tablet  Commonly known as:  ATARAX/VISTARIL  Take 1 tablet (25 mg total) by mouth 3 (three) times daily as needed for itching.     levothyroxine 125 MCG tablet  Commonly known as:  SYNTHROID, LEVOTHROID  Take 125 mcg by mouth every evening.     meclizine 25 MG tablet  Commonly known as:  ANTIVERT  Take 25 mg by mouth 2 (two) times daily.     ondansetron 4 MG tablet  Commonly known as:  ZOFRAN  Take 4 mg by mouth every 8 (eight) hours as needed for nausea.     pantoprazole 40 MG tablet  Commonly known as:  PROTONIX  Take 40 mg by mouth 2 (two) times daily.     torsemide 20 MG tablet  Commonly known as:  DEMADEX  Take 2 tablets (40 mg total) by mouth 2 (two) times daily.     traMADol 50 MG tablet  Commonly known as:  ULTRAM  Take 1 tablet (50 mg total) by mouth every 8 (eight) hours as needed.        Prescriptions given: Tramadol#30 No Refill  Disposition: home  Patient's condition: is Good  Follow up: 1. Dr. Oneida Alar in 4 weeks with CTA   Leontine Locket, PA-C [facility] [phone] 04/07/2015  7:51 AM   - For VQI Registry use --- Instructions: Press F2 to tab through selections.  Delete question if not applicable.   Post-op:  Time to Extubation: [x]  In OR, [ ]  < 12 hrs, [ ]  12-24 hrs, [ ]  >=24 hrs Vasopressors Req. Post-op: No MI: No., [ ]  Troponin only, [ ]  EKG or Clinical New Arrhythmia:  No CHF: No ICU Stay: 1 day in stepdown Transfusion: No  If yes, n/a units given  Complications: Resp failure: No., [ ]  Pneumonia, [ ]  Ventilator Chg in renal function: No. ESRD, [ ]  Inc. Cr > 0.5, [ ]  Temp. Dialysis, [ ]  Permanent dialysis Leg ischemia: No., no Surgery needed, [ ]  Yes, Surgery needed, [ ]  Amputation Bowel ischemia: No., [ ]  Medical Rx, [ ]  Surgical Rx Wound complication: No., [ ]  Superficial separation/infection, [ ]  Return to OR Return to OR: No  Return to OR for bleeding: No Stroke: No., [ ]  Minor, [ ]  Major  Discharge medications: Statin use:  Yes If No: [ ]  For Medical reasons, [ ]  Non-compliant, [ ]  Not-indicated ASA use:  Yes  If No: [ ]  For Medical reasons, [ ]  Non-compliant, [ ]  Not-indicated Plavix use:  No If No: [ ]  For Medical reasons, [ ]  Non-compliant, [ ]  Not-indicated Beta blocker use:  No If No: [ ]  For Medical reasons, [ ]  Non-compliant, [ ]  Not-indicated

## 2015-04-08 ENCOUNTER — Telehealth: Payer: Self-pay | Admitting: Vascular Surgery

## 2015-04-08 ENCOUNTER — Encounter (HOSPITAL_COMMUNITY): Payer: Self-pay | Admitting: *Deleted

## 2015-04-08 ENCOUNTER — Inpatient Hospital Stay (HOSPITAL_COMMUNITY)
Admission: EM | Admit: 2015-04-08 | Discharge: 2015-04-12 | DRG: 391 | Disposition: A | Discharge status: Home / Self Care | Payer: Medicare Other | Attending: Internal Medicine | Admitting: Internal Medicine

## 2015-04-08 DIAGNOSIS — Z7982 Long term (current) use of aspirin: Secondary | ICD-10-CM

## 2015-04-08 DIAGNOSIS — Z8673 Personal history of transient ischemic attack (TIA), and cerebral infarction without residual deficits: Secondary | ICD-10-CM

## 2015-04-08 DIAGNOSIS — H9192 Unspecified hearing loss, left ear: Secondary | ICD-10-CM | POA: Diagnosis present

## 2015-04-08 DIAGNOSIS — Z8679 Personal history of other diseases of the circulatory system: Secondary | ICD-10-CM

## 2015-04-08 DIAGNOSIS — F329 Major depressive disorder, single episode, unspecified: Secondary | ICD-10-CM | POA: Diagnosis present

## 2015-04-08 DIAGNOSIS — M069 Rheumatoid arthritis, unspecified: Secondary | ICD-10-CM | POA: Diagnosis present

## 2015-04-08 DIAGNOSIS — I739 Peripheral vascular disease, unspecified: Secondary | ICD-10-CM | POA: Diagnosis present

## 2015-04-08 DIAGNOSIS — I5022 Chronic systolic (congestive) heart failure: Secondary | ICD-10-CM | POA: Diagnosis present

## 2015-04-08 DIAGNOSIS — Z9849 Cataract extraction status, unspecified eye: Secondary | ICD-10-CM

## 2015-04-08 DIAGNOSIS — W19XXXA Unspecified fall, initial encounter: Secondary | ICD-10-CM | POA: Diagnosis present

## 2015-04-08 DIAGNOSIS — Z9889 Other specified postprocedural states: Secondary | ICD-10-CM

## 2015-04-08 DIAGNOSIS — Z992 Dependence on renal dialysis: Secondary | ICD-10-CM

## 2015-04-08 DIAGNOSIS — I872 Venous insufficiency (chronic) (peripheral): Secondary | ICD-10-CM | POA: Diagnosis present

## 2015-04-08 DIAGNOSIS — Z888 Allergy status to other drugs, medicaments and biological substances status: Secondary | ICD-10-CM

## 2015-04-08 DIAGNOSIS — D649 Anemia, unspecified: Secondary | ICD-10-CM

## 2015-04-08 DIAGNOSIS — R109 Unspecified abdominal pain: Secondary | ICD-10-CM | POA: Diagnosis not present

## 2015-04-08 DIAGNOSIS — R10811 Right upper quadrant abdominal tenderness: Secondary | ICD-10-CM

## 2015-04-08 DIAGNOSIS — I4892 Unspecified atrial flutter: Secondary | ICD-10-CM | POA: Diagnosis present

## 2015-04-08 DIAGNOSIS — I120 Hypertensive chronic kidney disease with stage 5 chronic kidney disease or end stage renal disease: Secondary | ICD-10-CM | POA: Diagnosis present

## 2015-04-08 DIAGNOSIS — K5900 Constipation, unspecified: Principal | ICD-10-CM | POA: Diagnosis present

## 2015-04-08 DIAGNOSIS — I252 Old myocardial infarction: Secondary | ICD-10-CM

## 2015-04-08 DIAGNOSIS — K219 Gastro-esophageal reflux disease without esophagitis: Secondary | ICD-10-CM | POA: Diagnosis present

## 2015-04-08 DIAGNOSIS — Z951 Presence of aortocoronary bypass graft: Secondary | ICD-10-CM

## 2015-04-08 DIAGNOSIS — N186 End stage renal disease: Secondary | ICD-10-CM | POA: Diagnosis present

## 2015-04-08 DIAGNOSIS — I2510 Atherosclerotic heart disease of native coronary artery without angina pectoris: Secondary | ICD-10-CM | POA: Diagnosis present

## 2015-04-08 DIAGNOSIS — Z95828 Presence of other vascular implants and grafts: Secondary | ICD-10-CM

## 2015-04-08 DIAGNOSIS — Z8546 Personal history of malignant neoplasm of prostate: Secondary | ICD-10-CM

## 2015-04-08 DIAGNOSIS — E875 Hyperkalemia: Secondary | ICD-10-CM

## 2015-04-08 DIAGNOSIS — G8918 Other acute postprocedural pain: Secondary | ICD-10-CM

## 2015-04-08 DIAGNOSIS — E785 Hyperlipidemia, unspecified: Secondary | ICD-10-CM | POA: Diagnosis present

## 2015-04-08 DIAGNOSIS — I4891 Unspecified atrial fibrillation: Secondary | ICD-10-CM | POA: Diagnosis present

## 2015-04-08 DIAGNOSIS — Z87891 Personal history of nicotine dependence: Secondary | ICD-10-CM

## 2015-04-08 DIAGNOSIS — N19 Unspecified kidney failure: Secondary | ICD-10-CM

## 2015-04-08 DIAGNOSIS — I714 Abdominal aortic aneurysm, without rupture: Secondary | ICD-10-CM | POA: Diagnosis present

## 2015-04-08 DIAGNOSIS — E039 Hypothyroidism, unspecified: Secondary | ICD-10-CM | POA: Diagnosis present

## 2015-04-08 HISTORY — DX: Disorder of kidney and ureter, unspecified: N28.9

## 2015-04-08 LAB — CBC WITH DIFFERENTIAL/PLATELET
Basophils Absolute: 0 10*3/uL (ref 0.0–0.1)
Basophils Relative: 0 % (ref 0–1)
EOS PCT: 1 % (ref 0–5)
Eosinophils Absolute: 0.1 10*3/uL (ref 0.0–0.7)
HCT: 28.7 % — ABNORMAL LOW (ref 39.0–52.0)
Hemoglobin: 8.9 g/dL — ABNORMAL LOW (ref 13.0–17.0)
LYMPHS ABS: 1.1 10*3/uL (ref 0.7–4.0)
LYMPHS PCT: 11 % — AB (ref 12–46)
MCH: 26.6 pg (ref 26.0–34.0)
MCHC: 31 g/dL (ref 30.0–36.0)
MCV: 85.7 fL (ref 78.0–100.0)
MONOS PCT: 8 % (ref 3–12)
Monocytes Absolute: 0.9 10*3/uL (ref 0.1–1.0)
Neutro Abs: 8.6 10*3/uL — ABNORMAL HIGH (ref 1.7–7.7)
Neutrophils Relative %: 80 % — ABNORMAL HIGH (ref 43–77)
PLATELETS: 190 10*3/uL (ref 150–400)
RBC: 3.35 MIL/uL — AB (ref 4.22–5.81)
RDW: 19 % — ABNORMAL HIGH (ref 11.5–15.5)
WBC: 10.7 10*3/uL — ABNORMAL HIGH (ref 4.0–10.5)

## 2015-04-08 LAB — COMPREHENSIVE METABOLIC PANEL
ALT: 59 U/L (ref 17–63)
AST: 181 U/L — ABNORMAL HIGH (ref 15–41)
Albumin: 2.4 g/dL — ABNORMAL LOW (ref 3.5–5.0)
Alkaline Phosphatase: 116 U/L (ref 38–126)
Anion gap: 16 — ABNORMAL HIGH (ref 5–15)
BUN: 53 mg/dL — ABNORMAL HIGH (ref 6–20)
CHLORIDE: 100 mmol/L — AB (ref 101–111)
CO2: 20 mmol/L — ABNORMAL LOW (ref 22–32)
Calcium: 8.1 mg/dL — ABNORMAL LOW (ref 8.9–10.3)
Creatinine, Ser: 9.82 mg/dL — ABNORMAL HIGH (ref 0.61–1.24)
GFR calc non Af Amer: 4 mL/min — ABNORMAL LOW (ref >60)
GFR, EST AFRICAN AMERICAN: 5 mL/min — AB (ref >60)
GLUCOSE: 110 mg/dL — AB (ref 65–99)
Potassium: 5.6 mmol/L — ABNORMAL HIGH (ref 3.5–5.1)
Sodium: 136 mmol/L (ref 135–145)
TOTAL PROTEIN: 6.7 g/dL (ref 6.5–8.1)
Total Bilirubin: 1.2 mg/dL (ref 0.3–1.2)

## 2015-04-08 LAB — LIPASE, BLOOD: Lipase: 14 U/L — ABNORMAL LOW (ref 22–51)

## 2015-04-08 MED ORDER — MORPHINE SULFATE 4 MG/ML IJ SOLN
4.0000 mg | Freq: Once | INTRAMUSCULAR | Status: AC
  Administered 2015-04-09: 4 mg via INTRAVENOUS
  Filled 2015-04-08: qty 1

## 2015-04-08 MED ORDER — ONDANSETRON HCL 4 MG/2ML IJ SOLN
4.0000 mg | Freq: Once | INTRAMUSCULAR | Status: AC
  Administered 2015-04-09: 4 mg via INTRAVENOUS
  Filled 2015-04-08: qty 2

## 2015-04-08 NOTE — ED Notes (Signed)
The pt had abd aneurysm surgery and was discharged from this hospital  Yesterday am.  Not eating today nauseated he  Walked outside and fell and pain since then.   Dialysis pt fistula lt arm.  Last dialyzed monday

## 2015-04-08 NOTE — ED Provider Notes (Signed)
CSN: [id number]     Arrival date & time 04/08/15  2053 History  This chart was scribed for Ripley Fraise, MD by Chester Holstein, ED Scribe. This patient was seen in room A13C/A13C and the patient's care was started at 11:40 PM.    Chief Complaint  Patient presents with  . Abdominal Pain     Patient is a 81 y.o. male presenting with abdominal pain. The history is provided by the patient. No language interpreter was used.  Abdominal Pain Pain radiates to:  Does not radiate Pain severity:  Moderate Onset quality:  Gradual Duration:  1 day Timing:  Constant Progression:  Worsening Chronicity:  Recurrent Context: previous surgery   Relieved by:  None tried Ineffective treatments:  None tried Associated symptoms: cough and nausea   Associated symptoms: no chest pain, no diarrhea, no fever and no vomiting   Nausea:    Severity:  Moderate   Onset quality:  Gradual   Duration:  1 day   Timing:  Constant   Progression:  Worsening Risk factors: being elderly, multiple surgeries and recent hospitalization    HPI Comments: [name] is a 81 y.o. male who presents to the Emergency Department with PMHx ofCAD, CHF, HTN, HLD, CVA, MI, AAA, ESRD, and prostate CA complaining of abdominal pain with onset this morning. Pt is s/p  abdominal aortic endovascular stent graft,  on 04/06/15 d/c yesterday. Pt states abdominal pain has worsened since the surgery.  Pt states he fell today, noting his "legs gave out." He notes generalized weakness, nausea, and mild cough.  No trauma or injury from fall. Pt was to dialyze today but felt too unwell to go so he rescheduled for tomorrow. Pt states he has not been to the bathroom today for a bowel movement. Pt denies vomiting, diarrhea, fever, chest pain, back pain, head injury, and LOC.   Past Medical History  Diagnosis Date  . Hypertension   . 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 Coronary artery disease     a. 05/2013 NSTEMI/Cath: LM 90d, LAD  90ost/39m, LCX 90ost, OM1 90p, RCA 100p;  b. 05/2013 CABG x 4: LIMA->LAD, VG->RI->OM, VG->PDA.  [name] Atrial flutter     a. Dx 05/2013-->dilt/coumadin.  [name] DVT (deep venous thrombosis) 02/2011    LLL,  as of 2016 pt. reports the DVT is resolved  . GERD (gastroesophageal reflux disease)   . Compression fracture of cervical spine   . Stasis dermatitis     LLL, wears TED hose  . Chronic systolic CHF (congestive heart failure)     EF 45-55% per echo 05/28/13  . Hyperlipidemia   . Dysphagia     a. after prolonged intubation 05/2013; b. 06/2013 refused EDG/Dilatation.  . Myocardial infarction   . Hypothyroidism   . Stroke     denies  . Constipation   . Dysrhythmia   . Varicose veins   . Peripheral arterial disease   . AAA (abdominal aortic aneurysm)   . 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 Depression     pt. reports that he doesn't take any med. for "sadness", but he admits to depression    . ESRD on dialysis 06/2013    Mon and Fri in [place]  . Rheumatoid arthritis     stopped methotrexate 2 yrs. ago  . Prostate cancer     pt. reports that he was on a table for 30 days, states "this things went  around my body" for 3 days. Pt. reports that he doesn't believe he even had prostate ca    Past Surgical History  Procedure Laterality Date  . Insertion of dialysis catheter N/A 05/26/2013    Procedure: INSERTION OF DIALYSIS CATHETER;  Surgeon: Elam Dutch, MD;  Location: [place];  Service: Vascular;  Laterality: N/A;  right IJ  . Cardiac surgery    . Cataract extraction    . Av fistula placement Left 07/08/2013    Procedure: INSERTION OF ARTERIOVENOUS (AV) GORE-TEX GRAFT ARM;  Surgeon: Elam Dutch, MD;  Location: [place];  Service: Vascular;  Laterality: Left;  . Coronary artery bypass graft N/A 05/13/2013    Procedure: CORONARY ARTERY BYPASS GRAFTING (CABG);  Surgeon: Gaye Pollack, MD;  Location: [place];  Service: Open Heart Surgery;  Laterality: N/A;  . Av fistula placement Left 02/03/2014     Procedure: INSERTION OF ARTERIOVENOUS (AV) GORE-TEX GRAFT USING 4-7 MM X 45 CM STRETCH GORETEX GRAFT;  Surgeon: Elam Dutch, MD;  Location: [place];  Service: Vascular;  Laterality: Left;  . Left heart catheterization with coronary angiogram N/A 05/13/2013    Procedure: LEFT HEART CATHETERIZATION WITH CORONARY ANGIOGRAM;  Surgeon: Burnell Blanks, MD;  Location: [facility] CATH LAB;  Service: Cardiovascular;  Laterality: N/A;  . Eye surgery Bilateral     cataract removed, ? IOL  . Abdominal aortic endovascular stent graft N/A 04/06/2015    Procedure: ABDOMINAL AORTIC ENDOVASCULAR STENT GRAFT;  Surgeon: Elam Dutch, MD;  Location: [facility] OR;  Service: Vascular;  Laterality: N/A;  . Endarterectomy femoral Right 04/06/2015    Procedure: Right ENDARTERECTOMY FEMORAL;  Surgeon: Elam Dutch, MD;  Location: [facility] OR;  Service: Vascular;  Laterality: Right;  . Patch angioplasty Right 04/06/2015    Procedure: Right Femoral PATCH ANGIOPLASTY;  Surgeon: Elam Dutch, MD;  Location: [facility] OR;  Service: Vascular;  Laterality: Right;   Family History  Problem Relation Age of Onset  . Heart attack Mother     deceased @ 14  . Kidney failure Father     deceased @ 32  . Heart attack Son     deceased @ 74   History  Substance Use Topics  . Smoking status: Former Smoker -- 1.00 packs/day for 47 years    Types: Cigarettes    Start date: 11/30/1960    Quit date: 11/05/2000  . Smokeless tobacco: Never Used  . Alcohol Use: No     Comment: Previously drank 6 beers/day, quit in 1994    Review of Systems  Constitutional: Negative for fever.  Respiratory: Positive for cough.   Cardiovascular: Negative for chest pain.  Gastrointestinal: Positive for nausea and abdominal pain. Negative for vomiting and diarrhea.  Musculoskeletal: Negative for back pain.  Neurological: Positive for weakness. Negative for syncope and headaches.  All other systems reviewed and are negative.     Allergies  Heparin  Home  Medications   Prior to Admission medications   Medication Sig Start Date End Date Taking? Authorizing Provider  acetaminophen (TYLENOL) 325 MG tablet Take 1-2 tablets (325-650 mg total) by mouth every 6 (six) hours as needed for pain. 06/16/13   Modena Jansky, MD  amiodarone (PACERONE) 200 MG tablet Take 1 tablet (200 mg total) by mouth daily. 02/01/15   Arnoldo Lenis, MD  aspirin EC 325 MG tablet Take 1 tablet (325 mg total) by mouth daily. 04/27/14   Arnoldo Lenis, MD  atorvastatin (LIPITOR) 80 MG  tablet Take 1 tablet (80 mg total) by mouth daily. Patient taking differently: Take 80 mg by mouth daily at 6 PM.  10/20/14   Arnoldo Lenis, MD  B Complex-C-Folic Acid (DIALYVITE 696) 0.8 MG TABS Take 1 tablet by mouth every evening.  11/23/14   Historical Provider, MD  diltiazem (CARDIZEM) 30 MG tablet Take 1 tablet (30 mg total) by mouth 2 (two) times daily. 12/31/14   Arnoldo Lenis, MD  hydrOXYzine (ATARAX/VISTARIL) 25 MG tablet Take 1 tablet (25 mg total) by mouth 3 (three) times daily as needed for itching. Patient taking differently: Take 25 mg by mouth 2 (two) times daily.  07/03/13   Ricke Hey, MD  levothyroxine (SYNTHROID, LEVOTHROID) 125 MCG tablet Take 125 mcg by mouth every evening.  03/16/15   Historical Provider, MD  meclizine (ANTIVERT) 25 MG tablet Take 25 mg by mouth 2 (two) times daily.     Historical Provider, MD  ondansetron (ZOFRAN) 4 MG tablet Take 4 mg by mouth every 8 (eight) hours as needed for nausea.  11/18/14   Historical Provider, MD  pantoprazole (PROTONIX) 40 MG tablet Take 40 mg by mouth 2 (two) times daily. 06/16/13   Modena Jansky, MD  torsemide (DEMADEX) 20 MG tablet Take 2 tablets (40 mg total) by mouth 2 (two) times daily. 04/01/15   Arnoldo Lenis, MD  traMADol (ULTRAM) 50 MG tablet Take 1 tablet (50 mg total) by mouth every 8 (eight) hours as needed. 04/07/15   Samantha J Rhyne, PA-C   BP 112/46 mmHg  Pulse 71  Temp(Src) 97.5 F (36.4  C) (Oral)  Resp 22  Ht 6' (1.829 m)  Wt 185 lb (83.915 kg)  BMI 25.08 kg/m2  SpO2 99% Physical Exam  Nursing note and vitals reviewed.    CONSTITUTIONAL: Well developed/well nourished, elderly HEAD: Normocephalic/atraumatic EYES: EOMI/PERRL ENMT: Mucous membranes moist NECK: supple no meningeal signs SPINE/BACK:entire spine nontender CV: S1/S2 noted, no murmurs/rubs/gallops noted LUNGS: crackles bilaterally, no apparent distress ABDOMEN: soft, diffuse moderate tenderness, no rebound or guarding, bowel sounds noted throughout abdomen GU:no cva tenderness; no scrotal tenderness NEURO: Pt is awake/alert/appropriate, moves all extremitiesx4.  No facial droop.   EXTREMITIES: pulses normal/equal in lower extremities, full ROM; dialysis access to left arm with thrill noted SKIN: warm, color normal; bruising noted to bilateral groins with mild tendnerness no thrill noted PSYCH: no abnormalities of mood noted, alert and oriented to situation   ED Course  Procedures   CRITICAL CARE Performed by: Sharyon Cable Total critical care time: 35 Critical care time was exclusive of separately billable procedures and treating other patients. Critical care was necessary to treat or prevent imminent or life-threatening deterioration. Critical care was time spent personally by me on the following activities: development of treatment plan with patient and/or surrogate as well as nursing, discussions with consultants, evaluation of patient's response to treatment, examination of patient, obtaining history from patient or surrogate, ordering and performing treatments and interventions, ordering and review of laboratory studies, ordering and review of radiographic studies, pulse oximetry and re-evaluation of patient's condition. PATIENT WITH RENAL FAILURE/HYPERKALEMIA, BRADYCARDIC EPISODE REQUIRING INSULIN AND ALSO CALCIUM  DIAGNOSTIC STUDIES: Oxygen Saturation is 99% on room air, normal by my  interpretation.    COORDINATION OF CARE: 11:50 PM Discussed treatment plan with patient at beside, the patient agrees with the plan and has no further questions at this time.  pt with recent AAA graft placement here with worsened diffuse abd pain Will obtain  CT angio abdomen/pelvis Pt currently stable 1:17 AM IV established Awaiting Ct imaging No new complaints Pt stable BP 112/50 mmHg  Pulse 65  Temp(Src) 97.5 F (36.4 C) (Oral)  Resp 21  Ht 6' (1.829 m)  Wt 185 lb (83.915 kg)  BMI 25.08 kg/m2  SpO2 98% 2:43 AM Pt with episode of bradycardia that improved He does have mild hyperKalemia Will treat hyperK while imaging pending 4:06 AM Ct results noted Will call vascular Pt stable, no new complaints BP 112/43 mmHg  Pulse 58  Temp(Src) 97.5 F (36.4 C) (Oral)  Resp 16  Ht 6' (1.829 m)  Wt 185 lb (83.915 kg)  BMI 25.08 kg/m2  SpO2 87%  4:13 AM D/w dr fields We reviewed imaging He reports this endoleak is known and no acute intervention at this time 4:40 AM D/W DR GARDNER REQUESTS LACTIC ACID AND WILL SEE PATIENT PT IS HEMODYNAMICALLY STABLE AT THIS TIME BP 116/49 mmHg  Pulse 63  Temp(Src) 97.5 F (36.4 C) (Oral)  Resp 17  Ht 6' (1.829 m)  Wt 185 lb (83.915 kg)  BMI 25.08 kg/m2  SpO2 96%   Labs Review Labs Reviewed  CBC WITH DIFFERENTIAL/PLATELET - Abnormal; Notable for the following:    WBC 10.7 (*)    RBC 3.35 (*)    Hemoglobin 8.9 (*)    HCT 28.7 (*)    RDW 19.0 (*)    Neutrophils Relative % 80 (*)    Neutro Abs 8.6 (*)    Lymphocytes Relative 11 (*)    All other components within normal limits  COMPREHENSIVE METABOLIC PANEL - Abnormal; Notable for the following:    Potassium 5.6 (*)    Chloride 100 (*)    CO2 20 (*)    Glucose, Bld 110 (*)    BUN 53 (*)    Creatinine, Ser 9.82 (*)    Calcium 8.1 (*)    Albumin 2.4 (*)    AST 181 (*)    GFR calc non Af Amer 4 (*)    GFR calc Af Amer 5 (*)    Anion gap 16 (*)    All other components  within normal limits  LIPASE, BLOOD - Abnormal; Notable for the following:    Lipase 14 (*)    All other components within normal limits  URINALYSIS, ROUTINE W REFLEX MICROSCOPIC (NOT AT [facility])    Imaging Review Dg Chest 2 View  04/09/2015   CLINICAL DATA:  Fatigue. Abdominal aortic endograft surgery 3 days prior.  EXAM: CHEST  2 VIEW  COMPARISON:  04/06/2015  FINDINGS: Patient is post median sternotomy. The heart is enlarged. Bibasilar opacities, likely atelectasis. There is unchanged mild blunting of left costophrenic angle. Unchanged peripheral opacity in the right mid lung. No pneumothorax. Vascular congestion is unchanged.  IMPRESSION: 1. Stable cardiomegaly and bibasilar opacities, likely atelectasis. 2. Unchanged vascular congestion. Unchanged peripheral opacity in the right midlung, pulmonary edema versus atelectasis.   Electronically Signed   By: Jeb Levering M.D.   On: 04/09/2015 03:18   Ct Cta Abd/pel W/cm &/or W/o Cm  04/09/2015   CLINICAL DATA:  Lower abdominal and groin pain. Post abdominal aortic endovascular stent graft 3 days prior. Fall. Decreased appetite.  EXAM: CTA ABDOMEN AND PELVIS wITHOUT AND WITH CONTRAST  TECHNIQUE: Multidetector CT imaging of the abdomen and pelvis was performed using the standard protocol during bolus administration of intravenous contrast. Multiplanar reconstructed images and MIPs were obtained and reviewed to evaluate the vascular anatomy.  CONTRAST:  172mL OMNIPAQUE IOHEXOL 350 MG/ML SOLN  COMPARISON:  CT 03/29/2015  FINDINGS: Small bilateral pleural effusions appears chronic. There is adjacent atelectasis in the lower lobe. Minimal ground-glass opacity in the right middle lobe. The heart is enlarged. There is contrast refluxing into the hepatic veins and IVC. There is no pericardial effusion.  Patient is post aorta bi-iliac stent graft of abdominal aortic aneurysm. There is a small focus of air in the aneurysm sac. Small amount of extraluminal contrast is  seen in the region of the limb bifurcation. The aneurysm sac is not significantly changed in size, greatest dimension 6.9 cm, preoperative the 7.1 cm. There is fluid distally in both iliac limbs. The celiac and superior mesenteric arteries remain patent, with proximal stenoses. There are 2 left renal arteries, on both remain patent. Single right renal artery remains patent.  Atherosclerotic calcification of the external iliac arteries. No evidence pseudoaneurysm formation. Mild soft tissue stranding about the I femoral vessels, may reflect minimal hematoma, no extravasation.  No focal hepatic lesion. The spleen is normal in size. Pancreas and adrenal glands are normal for arterial phase imaging. Small amount perihepatic ascites, increased in degree from prior. Small amount perihilar splenic ascites is new. There is vicarious excretion of contrast in the gallbladder from prior exams. Cortical thinning of both kidneys.  The stomach is decompressed. There is no duodenum wall thickening. Probable duodenum diverticulum. There is no evidence of aortoenteric fistula or IV contrast in the bowel. There are no dilated or thickened bowel loops. Small volume of colonic stool without colonic wall thickening. Diverticulosis of the distal colon without diverticulitis.  Excreted intravenous contrast within the urinary bladder from prior contrast administration. Small amount of free fluid in the pelvis. Brachytherapy seeds in the prostate gland.  There are no acute or suspicious osseous abnormalities.  Review of the MIP images confirms the above findings.  IMPRESSION: 1. Post recent aorto bi-iliac stent graft,. Unchanged size of the aneurysm sac, the graft is patent. There is a small endoleak, suspected type 2 with filling from the lumbar arteries, less likely type 3. Small focus of air in the aneurysm sac is likely procedure on etiology, there is no evidence of aortoenteric fistula. 2. No CT findings of pseudoaneurysm. Minimal soft  tissue stranding about the right femoral vessels, likely postprocedural. 3. Findings suggestive of right heart failure, with contrast refluxing into the IVC and hepatic veins. Intra-abdominal ascites has increased from preoperative exam. 4. Additional chronic findings as described.   Electronically Signed   By: Jeb Levering M.D.   On: 04/09/2015 03:56     EKG Interpretation   Date/Time:  Friday April 08 2015 23:50:09 EDT Ventricular Rate:  64 PR Interval:  56 QRS Duration: 181 QT Interval:  558 QTC Calculation: 576 R Axis:   152 Text Interpretation:  rhythm indeterminate suspect sinus Nonspecific  intraventricular conduction delay Borderline T abnormalities, lateral  leads No significant change since last tracing Confirmed by Christy Gentles  MD,  Jory Tanguma ([id number]) on 04/09/2015 12:07:04 AM     Medications  morphine 4 MG/ML injection 4 mg (4 mg Intravenous Given 04/09/15 0105)  ondansetron (ZOFRAN) injection 4 mg (4 mg Intravenous Given 04/09/15 0104)  iohexol (OMNIPAQUE) 350 MG/ML injection 100 mL (100 mLs Intravenous Contrast Given 04/09/15 0219)  dextrose 50 % solution 50 mL (50 mLs Intravenous Given 04/09/15 0327)  insulin aspart (novoLOG) injection 10 Units (10 Units Intravenous Given 04/09/15 0326)  calcium gluconate 1 g in sodium chloride 0.9 % 100 mL IVPB (  0 g Intravenous Stopped 04/09/15 0401)    EKG Interpretation  Date/Time:  Saturday April 09 2015 02:39:46 EDT Ventricular Rate:  52 PR Interval:  56 QRS Duration: 183 QT Interval:  611 QTC Calculation: 568 R Axis:   161 Text Interpretation:  Junctional rhythm Nonspecific intraventricular conduction delay Minimal ST depression, lateral leads No significant change since last tracing Confirmed by Christy Gentles  MD, Gu [place] ([id number]) on 04/09/2015 2:42:58 AM        MDM   Final diagnoses:  Renal failure  Post-operative pain  Hyperkalemia  Anemia, unspecified anemia type    Nursing notes including past medical history and social history  reviewed and considered in documentation Labs/vital reviewed myself and considered during evaluation Previous records reviewed and considered   I personally performed the services described in this documentation, which was scribed in my presence. The recorded information has been reviewed and is accurate.        Ripley Fraise, MD 04/09/15 [phone]

## 2015-04-08 NOTE — Telephone Encounter (Addendum)
sched CTA 05/05/15 @ 12:40  sched CEF 05/19/15 @3 :00 b/c of transportation issues  Spoke to pt's wife to inform pt of appt  Mailed ct ppw  ----- Message from Mena Goes, RN sent at 04/07/2015  3:38 PM EDT ----- Regarding: RE: Schedule His is on dialysis so it doesn't matter, the contrast is washed out with dialysis so leave it as CTA, Thanks ----- Message -----    From: Georgiann Mccoy    Sent: 04/07/2015   3:26 PM      To: Mena Goes, RN Subject: RE: Schedule                                   Hi Matther, Labell has ESRD; should he still have the dye for his cta or should we change this to a ct w/o?  Thank you, Selinda Eon   ----- Message -----    From: Mena Goes, RN    Sent: 04/07/2015   9:29 AM      To: [facility] Admin Pool Subject: Schedule                                         ----- Message -----    From: Gabriel Earing, PA-C    Sent: 04/07/2015   7:50 AM      To: [facility] Charge Pool  S/p EVAR 04/06/15.  F/u with Dr. Oneida Alar in 4 weeks with CTA protocol.  Thanks, [employer]

## 2015-04-09 ENCOUNTER — Encounter (HOSPITAL_COMMUNITY): Payer: Self-pay | Admitting: Radiology

## 2015-04-09 ENCOUNTER — Emergency Department (HOSPITAL_COMMUNITY): Payer: Medicare Other

## 2015-04-09 DIAGNOSIS — I714 Abdominal aortic aneurysm, without rupture: Secondary | ICD-10-CM | POA: Diagnosis present

## 2015-04-09 DIAGNOSIS — I2510 Atherosclerotic heart disease of native coronary artery without angina pectoris: Secondary | ICD-10-CM | POA: Diagnosis present

## 2015-04-09 DIAGNOSIS — Z7982 Long term (current) use of aspirin: Secondary | ICD-10-CM | POA: Diagnosis not present

## 2015-04-09 DIAGNOSIS — I252 Old myocardial infarction: Secondary | ICD-10-CM | POA: Diagnosis not present

## 2015-04-09 DIAGNOSIS — Z95828 Presence of other vascular implants and grafts: Secondary | ICD-10-CM

## 2015-04-09 DIAGNOSIS — Z8679 Personal history of other diseases of the circulatory system: Secondary | ICD-10-CM

## 2015-04-09 DIAGNOSIS — Z888 Allergy status to other drugs, medicaments and biological substances status: Secondary | ICD-10-CM | POA: Diagnosis not present

## 2015-04-09 DIAGNOSIS — D649 Anemia, unspecified: Secondary | ICD-10-CM | POA: Insufficient documentation

## 2015-04-09 DIAGNOSIS — Z951 Presence of aortocoronary bypass graft: Secondary | ICD-10-CM | POA: Diagnosis not present

## 2015-04-09 DIAGNOSIS — W19XXXA Unspecified fall, initial encounter: Secondary | ICD-10-CM | POA: Diagnosis present

## 2015-04-09 DIAGNOSIS — I5022 Chronic systolic (congestive) heart failure: Secondary | ICD-10-CM | POA: Diagnosis present

## 2015-04-09 DIAGNOSIS — I4891 Unspecified atrial fibrillation: Secondary | ICD-10-CM | POA: Diagnosis present

## 2015-04-09 DIAGNOSIS — K219 Gastro-esophageal reflux disease without esophagitis: Secondary | ICD-10-CM | POA: Diagnosis present

## 2015-04-09 DIAGNOSIS — Z992 Dependence on renal dialysis: Secondary | ICD-10-CM

## 2015-04-09 DIAGNOSIS — H9192 Unspecified hearing loss, left ear: Secondary | ICD-10-CM | POA: Diagnosis present

## 2015-04-09 DIAGNOSIS — R1011 Right upper quadrant pain: Secondary | ICD-10-CM | POA: Diagnosis not present

## 2015-04-09 DIAGNOSIS — E875 Hyperkalemia: Secondary | ICD-10-CM | POA: Diagnosis present

## 2015-04-09 DIAGNOSIS — K5900 Constipation, unspecified: Secondary | ICD-10-CM | POA: Diagnosis present

## 2015-04-09 DIAGNOSIS — I872 Venous insufficiency (chronic) (peripheral): Secondary | ICD-10-CM | POA: Diagnosis present

## 2015-04-09 DIAGNOSIS — I739 Peripheral vascular disease, unspecified: Secondary | ICD-10-CM | POA: Diagnosis present

## 2015-04-09 DIAGNOSIS — Z8546 Personal history of malignant neoplasm of prostate: Secondary | ICD-10-CM | POA: Diagnosis not present

## 2015-04-09 DIAGNOSIS — E039 Hypothyroidism, unspecified: Secondary | ICD-10-CM | POA: Diagnosis present

## 2015-04-09 DIAGNOSIS — N186 End stage renal disease: Secondary | ICD-10-CM

## 2015-04-09 DIAGNOSIS — Z9849 Cataract extraction status, unspecified eye: Secondary | ICD-10-CM | POA: Diagnosis not present

## 2015-04-09 DIAGNOSIS — R1084 Generalized abdominal pain: Secondary | ICD-10-CM | POA: Diagnosis not present

## 2015-04-09 DIAGNOSIS — R109 Unspecified abdominal pain: Secondary | ICD-10-CM | POA: Diagnosis present

## 2015-04-09 DIAGNOSIS — M7989 Other specified soft tissue disorders: Secondary | ICD-10-CM | POA: Diagnosis not present

## 2015-04-09 DIAGNOSIS — I120 Hypertensive chronic kidney disease with stage 5 chronic kidney disease or end stage renal disease: Secondary | ICD-10-CM | POA: Diagnosis present

## 2015-04-09 DIAGNOSIS — M069 Rheumatoid arthritis, unspecified: Secondary | ICD-10-CM | POA: Diagnosis present

## 2015-04-09 DIAGNOSIS — Z8673 Personal history of transient ischemic attack (TIA), and cerebral infarction without residual deficits: Secondary | ICD-10-CM | POA: Diagnosis not present

## 2015-04-09 DIAGNOSIS — Z9889 Other specified postprocedural states: Secondary | ICD-10-CM

## 2015-04-09 DIAGNOSIS — E785 Hyperlipidemia, unspecified: Secondary | ICD-10-CM | POA: Diagnosis present

## 2015-04-09 DIAGNOSIS — F329 Major depressive disorder, single episode, unspecified: Secondary | ICD-10-CM | POA: Diagnosis present

## 2015-04-09 DIAGNOSIS — Z87891 Personal history of nicotine dependence: Secondary | ICD-10-CM | POA: Diagnosis not present

## 2015-04-09 DIAGNOSIS — I4892 Unspecified atrial flutter: Secondary | ICD-10-CM | POA: Diagnosis present

## 2015-04-09 LAB — URINALYSIS, ROUTINE W REFLEX MICROSCOPIC
Glucose, UA: NEGATIVE mg/dL
HGB URINE DIPSTICK: NEGATIVE
Ketones, ur: 15 mg/dL — AB
Nitrite: NEGATIVE
PROTEIN: NEGATIVE mg/dL
SPECIFIC GRAVITY, URINE: 1.024 (ref 1.005–1.030)
Urobilinogen, UA: 0.2 mg/dL (ref 0.0–1.0)
pH: 5 (ref 5.0–8.0)

## 2015-04-09 LAB — CBC
HEMATOCRIT: 28.1 % — AB (ref 39.0–52.0)
HEMOGLOBIN: 8.8 g/dL — AB (ref 13.0–17.0)
MCH: 26.7 pg (ref 26.0–34.0)
MCHC: 31.3 g/dL (ref 30.0–36.0)
MCV: 85.4 fL (ref 78.0–100.0)
Platelets: 160 10*3/uL (ref 150–400)
RBC: 3.29 MIL/uL — ABNORMAL LOW (ref 4.22–5.81)
RDW: 19.2 % — ABNORMAL HIGH (ref 11.5–15.5)
WBC: 10.6 10*3/uL — ABNORMAL HIGH (ref 4.0–10.5)

## 2015-04-09 LAB — I-STAT CG4 LACTIC ACID, ED: LACTIC ACID, VENOUS: 2.24 mmol/L — AB (ref 0.5–2.0)

## 2015-04-09 LAB — URINE MICROSCOPIC-ADD ON

## 2015-04-09 LAB — BASIC METABOLIC PANEL
Anion gap: 14 (ref 5–15)
BUN: 56 mg/dL — AB (ref 6–20)
CALCIUM: 8 mg/dL — AB (ref 8.9–10.3)
CHLORIDE: 103 mmol/L (ref 101–111)
CO2: 20 mmol/L — ABNORMAL LOW (ref 22–32)
CREATININE: 9.74 mg/dL — AB (ref 0.61–1.24)
GFR calc Af Amer: 5 mL/min — ABNORMAL LOW (ref >60)
GFR calc non Af Amer: 4 mL/min — ABNORMAL LOW (ref >60)
GLUCOSE: 76 mg/dL (ref 65–99)
POTASSIUM: 5.2 mmol/L — AB (ref 3.5–5.1)
Sodium: 137 mmol/L (ref 135–145)

## 2015-04-09 MED ORDER — CALCIUM GLUCONATE 10 % IV SOLN: 1.0000 g | Freq: Once | INTRAVENOUS | Status: DC

## 2015-04-09 MED ORDER — INSULIN ASPART 100 UNIT/ML IV SOLN
10.0000 [IU] | Freq: Once | INTRAVENOUS | Status: AC
  Administered 2015-04-09: 10 [IU] via INTRAVENOUS

## 2015-04-09 MED ORDER — IOHEXOL 350 MG/ML SOLN
100.0000 mL | Freq: Once | INTRAVENOUS | Status: AC | PRN
  Administered 2015-04-09: 100 mL via INTRAVENOUS

## 2015-04-09 MED ORDER — SODIUM CHLORIDE 0.9 % IV SOLN
1.0000 g | Freq: Once | INTRAVENOUS | Status: AC
  Administered 2015-04-09: 1 g via INTRAVENOUS
  Filled 2015-04-09: qty 10

## 2015-04-09 MED ORDER — ASPIRIN EC 325 MG PO TBEC
325.0000 mg | DELAYED_RELEASE_TABLET | Freq: Every day | ORAL | Status: DC
  Administered 2015-04-09 – 2015-04-12 (×4): 325 mg via ORAL
  Filled 2015-04-09 (×4): qty 1

## 2015-04-09 MED ORDER — DILTIAZEM HCL 30 MG PO TABS
30.0000 mg | ORAL_TABLET | Freq: Two times a day (BID) | ORAL | Status: DC
  Administered 2015-04-09 – 2015-04-12 (×7): 30 mg via ORAL
  Filled 2015-04-09 (×8): qty 1

## 2015-04-09 MED ORDER — RENA-VITE PO TABS
1.0000 | ORAL_TABLET | Freq: Every evening | ORAL | Status: DC
  Administered 2015-04-09 – 2015-04-11 (×3): 1 via ORAL
  Filled 2015-04-09 (×4): qty 1

## 2015-04-09 MED ORDER — ALTEPLASE 2 MG IJ SOLR: 2.0000 mg | Freq: Once | INTRAMUSCULAR | Status: AC | PRN

## 2015-04-09 MED ORDER — SODIUM CHLORIDE 0.9 % IV SOLN: 100.0000 mL | INTRAVENOUS | Status: DC | PRN

## 2015-04-09 MED ORDER — NEPRO/CARBSTEADY PO LIQD: 237.0000 mL | ORAL | Status: DC | PRN

## 2015-04-09 MED ORDER — PENTAFLUOROPROP-TETRAFLUOROETH EX AERO: 1.0000 "application " | INHALATION_SPRAY | CUTANEOUS | Status: DC | PRN

## 2015-04-09 MED ORDER — LIDOCAINE HCL (PF) 1 % IJ SOLN: 5.0000 mL | INTRAMUSCULAR | Status: DC | PRN

## 2015-04-09 MED ORDER — AMIODARONE HCL 200 MG PO TABS
200.0000 mg | ORAL_TABLET | Freq: Every day | ORAL | Status: DC
  Administered 2015-04-09 – 2015-04-12 (×4): 200 mg via ORAL
  Filled 2015-04-09 (×4): qty 1

## 2015-04-09 MED ORDER — LEVOTHYROXINE SODIUM 125 MCG PO TABS
125.0000 ug | ORAL_TABLET | Freq: Every evening | ORAL | Status: DC
  Administered 2015-04-09 – 2015-04-11 (×3): 125 ug via ORAL
  Filled 2015-04-09 (×4): qty 1

## 2015-04-09 MED ORDER — ONDANSETRON HCL 4 MG PO TABS
4.0000 mg | ORAL_TABLET | Freq: Three times a day (TID) | ORAL | Status: DC | PRN
  Administered 2015-04-09 – 2015-04-10 (×3): 4 mg via ORAL
  Filled 2015-04-09 (×3): qty 1

## 2015-04-09 MED ORDER — LIDOCAINE-PRILOCAINE 2.5-2.5 % EX CREA: 1.0000 "application " | TOPICAL_CREAM | CUTANEOUS | Status: DC | PRN

## 2015-04-09 MED ORDER — MORPHINE SULFATE 2 MG/ML IJ SOLN: 2.0000 mg | INTRAMUSCULAR | Status: DC | PRN

## 2015-04-09 MED ORDER — DEXTROSE 50 % IV SOLN
50.0000 mL | Freq: Once | INTRAVENOUS | Status: AC
  Administered 2015-04-09: 50 mL via INTRAVENOUS
  Filled 2015-04-09: qty 50

## 2015-04-09 MED ORDER — MECLIZINE HCL 25 MG PO TABS
25.0000 mg | ORAL_TABLET | Freq: Two times a day (BID) | ORAL | Status: DC
Start: 2015-04-09 — End: 2015-04-12
  Administered 2015-04-09 – 2015-04-12 (×7): 25 mg via ORAL
  Filled 2015-04-09 (×8): qty 1

## 2015-04-09 MED ORDER — ATORVASTATIN CALCIUM 80 MG PO TABS
80.0000 mg | ORAL_TABLET | Freq: Every day | ORAL | Status: DC
  Administered 2015-04-09 – 2015-04-11 (×3): 80 mg via ORAL
  Filled 2015-04-09 (×4): qty 1

## 2015-04-09 MED ORDER — ACETAMINOPHEN 325 MG PO TABS: 325.0000 mg | ORAL_TABLET | Freq: Four times a day (QID) | ORAL | Status: DC | PRN

## 2015-04-09 MED ORDER — PANTOPRAZOLE SODIUM 40 MG PO TBEC
40.0000 mg | DELAYED_RELEASE_TABLET | Freq: Two times a day (BID) | ORAL | Status: DC
  Administered 2015-04-09 – 2015-04-12 (×7): 40 mg via ORAL
  Filled 2015-04-09 (×6): qty 1

## 2015-04-09 MED ORDER — HYDROCODONE-ACETAMINOPHEN 5-325 MG PO TABS
1.0000 | ORAL_TABLET | Freq: Four times a day (QID) | ORAL | Status: DC | PRN
Start: 2015-04-09 — End: 2015-04-12
  Administered 2015-04-09 – 2015-04-10 (×3): 1 via ORAL
  Filled 2015-04-09 (×3): qty 1

## 2015-04-09 MED ORDER — HYDROXYZINE HCL 25 MG PO TABS: 25.0000 mg | ORAL_TABLET | Freq: Three times a day (TID) | ORAL | Status: DC | PRN

## 2015-04-09 NOTE — ED Notes (Signed)
Patient dropped HR from 60's to 38.  Patient does not feel well at this time.  Patient continues with abdominal pain.

## 2015-04-09 NOTE — Progress Notes (Signed)
  [facility] Progress Note   Subjective: no complaints , abd still hurts today  Filed Vitals:   04/09/15 0500 04/09/15 0530 04/09/15 0631 04/09/15 0902  BP: 112/44 110/50 115/52 115/49  Pulse: 61 61 63 64  Temp:   98 F (36.7 C) 98.1 F (36.7 C)  TempSrc:   Oral Oral  Resp: 14 14 18 17   Height:   6\' 2"  (1.88 m)   Weight:   93 kg (205 lb 0.4 oz)   SpO2: 97% 96% 91% 90%   Exam: Alert, no distress, elderly WM No jvd Chest clear bilat RRR no MRG, sternal scar Abd soft, diffuse mild tenderness, +BS no rebound 1+ LLE edema w mild calf erythema, no RLE edema LUA AVF Neuro is nf Ox 3   K 5.2, Na 137, BUN 56, Creat 9.7, Ca 8 WBC 10  Hb 8.8  HD: MWF [facility] 4h   85kg   2/2.25 bath  Heparin none (allergic)   LUA AVF Only goes on Mondays' and Friday's per staff       Assessment: 1. Abd pain , unclear cause 2. S/P stent graft to AAA POD 3 3. ESRD on HD misses one HD per wk routinely per staff 4. Volume has some LE edema, wts inaccurate 5. Afib / flutter on amio/ dilt; has junctional rhythm on EKG today. No coumadin pt preference.   Plan - HD today, UF 2-3kg if BP tolerates    Kelly Splinter MD  pager [phone]    cell [phone]  04/09/2015, 12:46 PM     Recent Labs Lab 04/07/15 0430 04/08/15 2128 04/09/15 0849  NA 136 136 137  K 4.5 5.6* 5.2*  CL 101 100* 103  CO2 24 20* 20*  GLUCOSE 75 110* 76  BUN 36* 53* 56*  CREATININE 7.65* 9.82* 9.74*  CALCIUM 7.4* 8.1* 8.0*  PHOS 5.4*  --   --     Recent Labs Lab 04/07/15 0430 04/08/15 2128  AST  --  181*  ALT  --  59  ALKPHOS  --  116  BILITOT  --  1.2  PROT  --  6.7  ALBUMIN 2.0* 2.4*    Recent Labs Lab 04/07/15 0441 04/08/15 2128 04/09/15 0849  WBC 7.1 10.7* 10.6*  NEUTROABS  --  8.6*  --   HGB 8.5* 8.9* 8.8*  HCT 28.2* 28.7* 28.1*  MCV 86.2 85.7 85.4  PLT 179 190 160   . amiodarone  200 mg Oral Daily  . aspirin EC  325 mg Oral Daily  . atorvastatin  80 mg Oral q1800  .  diltiazem  30 mg Oral BID  . levothyroxine  125 mcg Oral QPM  . meclizine  25 mg Oral BID  . multivitamin  1 tablet Oral QPM  . pantoprazole  40 mg Oral BID     acetaminophen, HYDROcodone-acetaminophen, hydrOXYzine, morphine injection, ondansetron

## 2015-04-09 NOTE — H&P (Signed)
[facility] History and Physical  [name] VVO:[id number] DOB: September 20, 1934 DOA: 04/08/2015  Referring physician: EDP PCP: Glenda Chroman., MD   Chief Complaint: Abd pain   HPI: [name] is a 81 y.o. male ESRD dialysis MWF, who is POD # 3 from EVAR of AAA.  He presents to the ED with c/o constant, worsening, abdominal pain and nausea since the time of surgery.  He did not have a BM yesterday but has had BMs since surgery he states.  He has not been eating at home.  His dialysis schedule got messed up because of surgery he says and he missed dialysis yesterday.  He has generalized weakness, he states he fell today as his "legs gave out".  No injury from fall.  No loss of sensation in legs, numbness, tingling, etc.  No vomiting, diarrhea, fever, chest pain, back pain.  Review of Systems: Systems reviewed.  As above, otherwise negative  Past Medical History  Diagnosis Date  . Hypertension   . 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 Coronary artery disease     a. 05/2013 NSTEMI/Cath: LM 90d, LAD 90ost/14m, LCX 90ost, OM1 90p, RCA 100p;  b. 05/2013 CABG x 4: LIMA->LAD, VG->RI->OM, VG->PDA.  [name] Atrial flutter     a. Dx 05/2013-->dilt/coumadin.  [name] DVT (deep venous thrombosis) 02/2011    LLL,  as of 2016 pt. reports the DVT is resolved  . GERD (gastroesophageal reflux disease)   . Compression fracture of cervical spine   . Stasis dermatitis     LLL, wears TED hose  . Chronic systolic CHF (congestive heart failure)     EF 45-55% per echo 05/28/13  . Hyperlipidemia   . Dysphagia     a. after prolonged intubation 05/2013; b. 06/2013 refused EDG/Dilatation.  . Myocardial infarction   . Hypothyroidism   . Stroke     denies  . Constipation   . Dysrhythmia   . Varicose veins   . Peripheral arterial disease   . AAA (abdominal aortic aneurysm)   . 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 Depression     pt. reports that he doesn't take any med. for "sadness", but he admits to  depression    . ESRD on dialysis 06/2013    Mon and Fri in [place]  . Rheumatoid arthritis     stopped methotrexate 2 yrs. ago  . Prostate cancer     pt. reports that he was on a table for 30 days, states "this things went around my body" for 3 days. Pt. reports that he doesn't believe he even had prostate ca   . Renal insufficiency    Past Surgical History  Procedure Laterality Date  . Insertion of dialysis catheter N/A 05/26/2013    Procedure: INSERTION OF DIALYSIS CATHETER;  Surgeon: Elam Dutch, MD;  Location: [place];  Service: Vascular;  Laterality: N/A;  right IJ  . Cardiac surgery    . Cataract extraction    . Av fistula placement Left 07/08/2013    Procedure: INSERTION OF ARTERIOVENOUS (AV) GORE-TEX GRAFT ARM;  Surgeon: Elam Dutch, MD;  Location: [place];  Service: Vascular;  Laterality: Left;  . Coronary artery bypass graft N/A 05/13/2013    Procedure: CORONARY ARTERY BYPASS GRAFTING (CABG);  Surgeon: Gaye Pollack, MD;  Location: [place];  Service: Open Heart Surgery;  Laterality: N/A;  . Av fistula placement Left 02/03/2014    Procedure: INSERTION  OF ARTERIOVENOUS (AV) GORE-TEX GRAFT USING 4-7 MM X 45 CM STRETCH GORETEX GRAFT;  Surgeon: Elam Dutch, MD;  Location: [place];  Service: Vascular;  Laterality: Left;  . Left heart catheterization with coronary angiogram N/A 05/13/2013    Procedure: LEFT HEART CATHETERIZATION WITH CORONARY ANGIOGRAM;  Surgeon: Burnell Blanks, MD;  Location: [facility] CATH LAB;  Service: Cardiovascular;  Laterality: N/A;  . Eye surgery Bilateral     cataract removed, ? IOL  . Abdominal aortic endovascular stent graft N/A 04/06/2015    Procedure: ABDOMINAL AORTIC ENDOVASCULAR STENT GRAFT;  Surgeon: Elam Dutch, MD;  Location[facility];  Service: Vascular;  Laterality: N/A;  . Endarterectomy femoral Right 04/06/2015    Procedure: Right ENDARTERECTOMY FEMORAL;  Surgeon: Elam Dutch, MD;  Location: [place];  Service: Vascular;  Laterality: Right;  . Patch  angioplasty Right 04/06/2015    Procedure: Right Femoral PATCH ANGIOPLASTY;  Surgeon: Elam Dutch, MD;  Location: [place];  Service: Vascular;  Laterality: Right;   Social History:  reports that he quit smoking about 14 years ago. His smoking use included Cigarettes. He started smoking about 54 years ago. He has a 47 pack-year smoking history. He has never used smokeless tobacco. He reports that he does not drink alcohol or use illicit drugs.  Allergies  Allergen Reactions  . Heparin Other (See Comments)    HIT ab+ but SRA negative    Family History  Problem Relation Age of Onset  . Heart attack Mother     deceased @ 33  . Kidney failure Father     deceased @ 32  . Heart attack Son     deceased @ 49     Prior to Admission medications   Medication Sig Start Date End Date Taking? Authorizing Provider  acetaminophen (TYLENOL) 325 MG tablet Take 1-2 tablets (325-650 mg total) by mouth every 6 (six) hours as needed for pain. 06/16/13  Yes Modena Jansky, MD  amiodarone (PACERONE) 200 MG tablet Take 1 tablet (200 mg total) by mouth daily. 02/01/15  Yes Arnoldo Lenis, MD  aspirin EC 325 MG tablet Take 1 tablet (325 mg total) by mouth daily. 04/27/14  Yes Arnoldo Lenis, MD  atorvastatin (LIPITOR) 80 MG tablet Take 1 tablet (80 mg total) by mouth daily. Patient taking differently: Take 80 mg by mouth daily at 6 PM.  10/20/14  Yes Arnoldo Lenis, MD  B Complex-C-Folic Acid (DIALYVITE 563) 0.8 MG TABS Take 1 tablet by mouth every evening.  11/23/14  Yes Historical Provider, MD  diltiazem (CARDIZEM) 30 MG tablet Take 1 tablet (30 mg total) by mouth 2 (two) times daily. 12/31/14  Yes Arnoldo Lenis, MD  hydrOXYzine (ATARAX/VISTARIL) 25 MG tablet Take 1 tablet (25 mg total) by mouth 3 (three) times daily as needed for itching. Patient taking differently: Take 25 mg by mouth 2 (two) times daily.  07/03/13  Yes Ricke Hey, MD  levothyroxine (SYNTHROID, LEVOTHROID) 125 MCG  tablet Take 125 mcg by mouth every evening.  03/16/15  Yes Historical Provider, MD  meclizine (ANTIVERT) 25 MG tablet Take 25 mg by mouth 2 (two) times daily.    Yes Historical Provider, MD  ondansetron (ZOFRAN) 4 MG tablet Take 4 mg by mouth every 8 (eight) hours as needed for nausea.  11/18/14  Yes Historical Provider, MD  pantoprazole (PROTONIX) 40 MG tablet Take 40 mg by mouth 2 (two) times daily. 06/16/13  Yes Modena Jansky, MD  torsemide (DEMADEX) 20 MG tablet Take 2 tablets (40 mg total) by mouth 2 (two) times daily. 04/01/15  Yes Arnoldo Lenis, MD  traMADol (ULTRAM) 50 MG tablet Take 1 tablet (50 mg total) by mouth every 8 (eight) hours as needed. 04/07/15  Yes Gabriel Earing, PA-C   Physical Exam: Filed Vitals:   04/09/15 0500  BP: 112/44  Pulse: 61  Temp:   Resp: 14    BP 112/44 mmHg  Pulse 61  Temp(Src) 97.5 F (36.4 C) (Oral)  Resp 14  Ht 6' (1.829 m)  Wt 83.915 kg (185 lb)  BMI 25.08 kg/m2  SpO2 97%  General Appearance:    Alert, oriented, no distress, appears stated age  Head:    Normocephalic, atraumatic  Eyes:    PERRL, EOMI, sclera non-icteric        Nose:   Nares without drainage or epistaxis. Mucosa, turbinates normal  Throat:   Moist mucous membranes. Oropharynx without erythema or exudate.  Neck:   Supple. No carotid bruits.  No thyromegaly.  No lymphadenopathy.   Back:     No CVA tenderness, no spinal tenderness  Lungs:     Clear to auscultation bilaterally, without wheezes, rhonchi or rales  Chest wall:    No tenderness to palpitation  Heart:    Regular rate and rhythm without murmurs, gallops, rubs  Abdomen:     Soft, non-tender, nondistended, normal bowel sounds, no organomegaly  Genitalia:    deferred  Rectal:    deferred  Extremities:   No clubbing, cyanosis or edema.  Pulses:   2+ and symmetric all extremities  Skin:   Skin color, texture, turgor normal, no rashes or lesions  Lymph nodes:   Cervical, supraclavicular, and axillary nodes normal   Neurologic:   CNII-XII intact. Normal strength, sensation and reflexes      throughout    Labs on Admission:  Basic Metabolic Panel:  Recent Labs Lab 04/06/15 1011 04/06/15 1937 04/06/15 1955 04/07/15 0430 04/08/15 2128  NA 141  --  139 136 136  K 3.9  --  4.1 4.5 5.6*  CL  --   --  104 101 100*  CO2  --   --  25 24 20*  GLUCOSE 80  --  76 75 110*  BUN  --   --  32* 36* 53*  CREATININE  --   --  7.51* 7.65* 9.82*  CALCIUM  --   --  7.6* 7.4* 8.1*  MG  --  1.8  --   --   --   PHOS  --   --   --  5.4*  --    Liver Function Tests:  Recent Labs Lab 04/07/15 0430 04/08/15 2128  AST  --  181*  ALT  --  59  ALKPHOS  --  116  BILITOT  --  1.2  PROT  --  6.7  ALBUMIN 2.0* 2.4*    Recent Labs Lab 04/08/15 2128  LIPASE 14*   No results for input(s): AMMONIA in the last 168 hours. CBC:  Recent Labs Lab 04/06/15 1011 04/06/15 1937 04/07/15 0441 04/08/15 2128  WBC  --  7.1 7.1 10.7*  NEUTROABS  --   --   --  8.6*  HGB 10.5* 8.9* 8.5* 8.9*  HCT 31.0* 28.7* 28.2* 28.7*  MCV  --  86.7 86.2 85.7  PLT  --  180 179 190   Cardiac Enzymes: No results for input(s): CKTOTAL, CKMB, CKMBINDEX, TROPONINI in  the last 168 hours.  BNP (last 3 results) No results for input(s): PROBNP in the last 8760 hours. CBG: No results for input(s): GLUCAP in the last 168 hours.  Radiological Exams on Admission: Dg Chest 2 View  04/09/2015   CLINICAL DATA:  Fatigue. Abdominal aortic endograft surgery 3 days prior.  EXAM: CHEST  2 VIEW  COMPARISON:  04/06/2015  FINDINGS: Patient is post median sternotomy. The heart is enlarged. Bibasilar opacities, likely atelectasis. There is unchanged mild blunting of left costophrenic angle. Unchanged peripheral opacity in the right mid lung. No pneumothorax. Vascular congestion is unchanged.  IMPRESSION: 1. Stable cardiomegaly and bibasilar opacities, likely atelectasis. 2. Unchanged vascular congestion. Unchanged peripheral opacity in the right  midlung, pulmonary edema versus atelectasis.   Electronically Signed   By: Jeb Levering M.D.   On: 04/09/2015 03:18   Ct Cta Abd/pel W/cm &/or W/o Cm  04/09/2015   CLINICAL DATA:  Lower abdominal and groin pain. Post abdominal aortic endovascular stent graft 3 days prior. Fall. Decreased appetite.  EXAM: CTA ABDOMEN AND PELVIS wITHOUT AND WITH CONTRAST  TECHNIQUE: Multidetector CT imaging of the abdomen and pelvis was performed using the standard protocol during bolus administration of intravenous contrast. Multiplanar reconstructed images and MIPs were obtained and reviewed to evaluate the vascular anatomy.  CONTRAST:  13mL OMNIPAQUE IOHEXOL 350 MG/ML SOLN  COMPARISON:  CT 03/29/2015  FINDINGS: Small bilateral pleural effusions appears chronic. There is adjacent atelectasis in the lower lobe. Minimal ground-glass opacity in the right middle lobe. The heart is enlarged. There is contrast refluxing into the hepatic veins and IVC. There is no pericardial effusion.  Patient is post aorta bi-iliac stent graft of abdominal aortic aneurysm. There is a small focus of air in the aneurysm sac. Small amount of extraluminal contrast is seen in the region of the limb bifurcation. The aneurysm sac is not significantly changed in size, greatest dimension 6.9 cm, preoperative the 7.1 cm. There is fluid distally in both iliac limbs. The celiac and superior mesenteric arteries remain patent, with proximal stenoses. There are 2 left renal arteries, on both remain patent. Single right renal artery remains patent.  Atherosclerotic calcification of the external iliac arteries. No evidence pseudoaneurysm formation. Mild soft tissue stranding about the I femoral vessels, may reflect minimal hematoma, no extravasation.  No focal hepatic lesion. The spleen is normal in size. Pancreas and adrenal glands are normal for arterial phase imaging. Small amount perihepatic ascites, increased in degree from prior. Small amount perihilar  splenic ascites is new. There is vicarious excretion of contrast in the gallbladder from prior exams. Cortical thinning of both kidneys.  The stomach is decompressed. There is no duodenum wall thickening. Probable duodenum diverticulum. There is no evidence of aortoenteric fistula or IV contrast in the bowel. There are no dilated or thickened bowel loops. Small volume of colonic stool without colonic wall thickening. Diverticulosis of the distal colon without diverticulitis.  Excreted intravenous contrast within the urinary bladder from prior contrast administration. Small amount of free fluid in the pelvis. Brachytherapy seeds in the prostate gland.  There are no acute or suspicious osseous abnormalities.  Review of the MIP images confirms the above findings.  IMPRESSION: 1. Post recent aorto bi-iliac stent graft,. Unchanged size of the aneurysm sac, the graft is patent. There is a small endoleak, suspected type 2 with filling from the lumbar arteries, less likely type 3. Small focus of air in the aneurysm sac is likely procedure on etiology, there is no  evidence of aortoenteric fistula. 2. No CT findings of pseudoaneurysm. Minimal soft tissue stranding about the right femoral vessels, likely postprocedural. 3. Findings suggestive of right heart failure, with contrast refluxing into the IVC and hepatic veins. Intra-abdominal ascites has increased from preoperative exam. 4. Additional chronic findings as described.   Electronically Signed   By: Jeb Levering M.D.   On: 04/09/2015 03:56    EKG: Independently reviewed.  Assessment/Plan Principal Problem:   Abdominal pain Active Problems:   ESRD on dialysis   Status post AAA (abdominal aortic aneurysm) repair   History of repair of aneurysm of abdominal aorta using endovascular stent graft   Hyperkalemia   1. Abdominal pain -  1. EDP spoke with Dr. Oneida Alar who did not feel that the endoleak was the cause of this. 2. Lactate was barely elevated at  2.2 3. Symptom control for now with morphine and zofran. 2. Hyperkalemia - mild 1. Called nephrology consult line for dialysis today 2. Patient got calcium in ED 3. ESRD - needs dialysis as noted above 4. HTN - continue home meds 5. H/o A.Flutter - continue amiodarone    Code Status: Full Code  Family Communication: No family in room Disposition Plan: Admit to inpatient   Time spent: 70 min  GARDNER, JARED M. [facility] Pager [phone]  If 7AM-7PM, please contact the day team taking care of the patient [email or web address] Password [email or web address] 04/09/2015, 5:19 AM

## 2015-04-09 NOTE — ED Notes (Signed)
Patient transported to CT and xray 

## 2015-04-09 NOTE — ED Notes (Signed)
IV team at bedside 

## 2015-04-09 NOTE — Progress Notes (Addendum)
[facility] PROGRESS NOTE  [name] OJJ:[id number] DOB: [birth year] DOA: 04/08/2015 PCP: Glenda Chroman., MD  Assessment/Plan: 1. Abd pain -etiology unclear -POD 3 following endovascular stent graft for AAA -CT mostly unremarkable, patent graft and small endoleak- unclear significance -will ask VVS to eval -supportive care, clears, anti-emetics  2. Hyperkalemia -missed HD yesterday -Renal notified, gets HD M & F at [facility]  3. ESRD on HD M&F -Renal notified  4. HTN  5. P.Afib/flutter -continue diltiazem, amiodarone, ASA -not on coumadin per patient preference, per Cards notes  DVT proph: SCDs, has heparin allergy  Code Status: Full Code Family Communication: none at bedside Disposition Plan: home when improved   Consultants:  Renal  VVS pending  HPI/Subjective: Still complains of diffuse abd pain and nausea, normal BM yesterday  Objective: Filed Vitals:   04/09/15 0631  BP: 115/52  Pulse: 63  Temp: 98 F (36.7 C)  Resp: 18   No intake or output data in the 24 hours ending 04/09/15 0806 Filed Weights   04/08/15 2102 04/09/15 0631  Weight: 83.915 kg (185 lb) 93 kg (205 lb 0.4 oz)    Exam:   General:  AAOx3, no distress  Cardiovascular: S1S2/RRR  Respiratory: CTAB  Abdomen: soft, NT, ND, BS present  Musculoskeletal: L leg with 2plus edema-chronic per pt   Data Reviewed: Basic Metabolic Panel:  Recent Labs Lab 04/06/15 1011 04/06/15 1937 04/06/15 1955 04/07/15 0430 04/08/15 2128  NA 141  --  139 136 136  K 3.9  --  4.1 4.5 5.6*  CL  --   --  104 101 100*  CO2  --   --  25 24 20*  GLUCOSE 80  --  76 75 110*  BUN  --   --  32* 36* 53*  CREATININE  --   --  7.51* 7.65* 9.82*  CALCIUM  --   --  7.6* 7.4* 8.1*  MG  --  1.8  --   --   --   PHOS  --   --   --  5.4*  --    Liver Function Tests:  Recent Labs Lab 04/07/15 0430 04/08/15 2128  AST  --  181*  ALT  --  59  ALKPHOS  --  116  BILITOT  --  1.2  PROT  --  6.7   ALBUMIN 2.0* 2.4*    Recent Labs Lab 04/08/15 2128  LIPASE 14*   No results for input(s): AMMONIA in the last 168 hours. CBC:  Recent Labs Lab 04/06/15 1011 04/06/15 1937 04/07/15 0441 04/08/15 2128  WBC  --  7.1 7.1 10.7*  NEUTROABS  --   --   --  8.6*  HGB 10.5* 8.9* 8.5* 8.9*  HCT 31.0* 28.7* 28.2* 28.7*  MCV  --  86.7 86.2 85.7  PLT  --  180 179 190   Cardiac Enzymes: No results for input(s): CKTOTAL, CKMB, CKMBINDEX, TROPONINI in the last 168 hours. BNP (last 3 results) No results for input(s): BNP in the last 8760 hours.  ProBNP (last 3 results) No results for input(s): PROBNP in the last 8760 hours.  CBG: No results for input(s): GLUCAP in the last 168 hours.  Recent Results (from the past 240 hour(s))  Surgical pcr screen     Status: Abnormal   Collection Time: 04/01/15  2:12 PM  Result Value Ref Range Status   MRSA, PCR NEGATIVE NEGATIVE Final   Staphylococcus aureus POSITIVE (A) NEGATIVE Final    Comment:  The Xpert SA Assay (FDA approved for NASAL specimens in patients over 23 years of age), is one component of a comprehensive surveillance program.  Test performance has been validated by [facility] for patients greater than or equal to 18 year old. It is not intended to diagnose infection nor to guide or monitor treatment.      Studies: Dg Chest 2 View  04/09/2015   CLINICAL DATA:  Fatigue. Abdominal aortic endograft surgery 3 days prior.  EXAM: CHEST  2 VIEW  COMPARISON:  04/06/2015  FINDINGS: Patient is post median sternotomy. The heart is enlarged. Bibasilar opacities, likely atelectasis. There is unchanged mild blunting of left costophrenic angle. Unchanged peripheral opacity in the right mid lung. No pneumothorax. Vascular congestion is unchanged.  IMPRESSION: 1. Stable cardiomegaly and bibasilar opacities, likely atelectasis. 2. Unchanged vascular congestion. Unchanged peripheral opacity in the right midlung, pulmonary edema versus  atelectasis.   Electronically Signed   By: Jeb Levering M.D.   On: 04/09/2015 03:18   Ct Cta Abd/pel W/cm &/or W/o Cm  04/09/2015   CLINICAL DATA:  Lower abdominal and groin pain. Post abdominal aortic endovascular stent graft 3 days prior. Fall. Decreased appetite.  EXAM: CTA ABDOMEN AND PELVIS wITHOUT AND WITH CONTRAST  TECHNIQUE: Multidetector CT imaging of the abdomen and pelvis was performed using the standard protocol during bolus administration of intravenous contrast. Multiplanar reconstructed images and MIPs were obtained and reviewed to evaluate the vascular anatomy.  CONTRAST:  176mL OMNIPAQUE IOHEXOL 350 MG/ML SOLN  COMPARISON:  CT 03/29/2015  FINDINGS: Small bilateral pleural effusions appears chronic. There is adjacent atelectasis in the lower lobe. Minimal ground-glass opacity in the right middle lobe. The heart is enlarged. There is contrast refluxing into the hepatic veins and IVC. There is no pericardial effusion.  Patient is post aorta bi-iliac stent graft of abdominal aortic aneurysm. There is a small focus of air in the aneurysm sac. Small amount of extraluminal contrast is seen in the region of the limb bifurcation. The aneurysm sac is not significantly changed in size, greatest dimension 6.9 cm, preoperative the 7.1 cm. There is fluid distally in both iliac limbs. The celiac and superior mesenteric arteries remain patent, with proximal stenoses. There are 2 left renal arteries, on both remain patent. Single right renal artery remains patent.  Atherosclerotic calcification of the external iliac arteries. No evidence pseudoaneurysm formation. Mild soft tissue stranding about the I femoral vessels, may reflect minimal hematoma, no extravasation.  No focal hepatic lesion. The spleen is normal in size. Pancreas and adrenal glands are normal for arterial phase imaging. Small amount perihepatic ascites, increased in degree from prior. Small amount perihilar splenic ascites is new. There is  vicarious excretion of contrast in the gallbladder from prior exams. Cortical thinning of both kidneys.  The stomach is decompressed. There is no duodenum wall thickening. Probable duodenum diverticulum. There is no evidence of aortoenteric fistula or IV contrast in the bowel. There are no dilated or thickened bowel loops. Small volume of colonic stool without colonic wall thickening. Diverticulosis of the distal colon without diverticulitis.  Excreted intravenous contrast within the urinary bladder from prior contrast administration. Small amount of free fluid in the pelvis. Brachytherapy seeds in the prostate gland.  There are no acute or suspicious osseous abnormalities.  Review of the MIP images confirms the above findings.  IMPRESSION: 1. Post recent aorto bi-iliac stent graft,. Unchanged size of the aneurysm sac, the graft is patent. There is a small endoleak, suspected type  2 with filling from the lumbar arteries, less likely type 3. Small focus of air in the aneurysm sac is likely procedure on etiology, there is no evidence of aortoenteric fistula. 2. No CT findings of pseudoaneurysm. Minimal soft tissue stranding about the right femoral vessels, likely postprocedural. 3. Findings suggestive of right heart failure, with contrast refluxing into the IVC and hepatic veins. Intra-abdominal ascites has increased from preoperative exam. 4. Additional chronic findings as described.   Electronically Signed   By: Jeb Levering M.D.   On: 04/09/2015 03:56    Scheduled Meds: . amiodarone  200 mg Oral Daily  . aspirin EC  325 mg Oral Daily  . atorvastatin  80 mg Oral q1800  . diltiazem  30 mg Oral BID  . levothyroxine  125 mcg Oral QPM  . meclizine  25 mg Oral BID  . multivitamin  1 tablet Oral QPM  . pantoprazole  40 mg Oral BID   Continuous Infusions:  Antibiotics Given (last 72 hours)    None      Principal Problem:   Abdominal pain Active Problems:   ESRD on dialysis   Status post AAA  (abdominal aortic aneurysm) repair   History of repair of aneurysm of abdominal aorta using endovascular stent graft   Hyperkalemia    Time spent: 73min    Rayelynn Loyal  [facility] Pager [phone]. If 7PM-7AM, please contact night-coverage at [email or web address], password [facility] 04/09/2015, 8:06 AM  LOS: 0 days

## 2015-04-10 DIAGNOSIS — R1084 Generalized abdominal pain: Secondary | ICD-10-CM

## 2015-04-10 DIAGNOSIS — Z95828 Presence of other vascular implants and grafts: Secondary | ICD-10-CM

## 2015-04-10 LAB — BASIC METABOLIC PANEL
ANION GAP: 12 (ref 5–15)
BUN: 23 mg/dL — ABNORMAL HIGH (ref 6–20)
CALCIUM: 7.9 mg/dL — AB (ref 8.9–10.3)
CO2: 26 mmol/L (ref 22–32)
Chloride: 101 mmol/L (ref 101–111)
Creatinine, Ser: 5.63 mg/dL — ABNORMAL HIGH (ref 0.61–1.24)
GFR calc Af Amer: 10 mL/min — ABNORMAL LOW (ref >60)
GFR, EST NON AFRICAN AMERICAN: 9 mL/min — AB (ref >60)
Glucose, Bld: 76 mg/dL (ref 65–99)
Potassium: 4.3 mmol/L (ref 3.5–5.1)
Sodium: 139 mmol/L (ref 135–145)

## 2015-04-10 LAB — CBC
HEMATOCRIT: 26.7 % — AB (ref 39.0–52.0)
HEMOGLOBIN: 8.2 g/dL — AB (ref 13.0–17.0)
MCH: 26.4 pg (ref 26.0–34.0)
MCHC: 30.7 g/dL (ref 30.0–36.0)
MCV: 85.9 fL (ref 78.0–100.0)
Platelets: 193 10*3/uL (ref 150–400)
RBC: 3.11 MIL/uL — ABNORMAL LOW (ref 4.22–5.81)
RDW: 19.3 % — ABNORMAL HIGH (ref 11.5–15.5)
WBC: 9.4 10*3/uL (ref 4.0–10.5)

## 2015-04-10 MED ORDER — FLEET ENEMA 7-19 GM/118ML RE ENEM
1.0000 | ENEMA | Freq: Once | RECTAL | Status: DC
  Filled 2015-04-10: qty 1

## 2015-04-10 MED ORDER — BISACODYL 10 MG RE SUPP: 10.0000 mg | Freq: Every day | RECTAL | Status: DC | PRN

## 2015-04-10 MED ORDER — POLYETHYLENE GLYCOL 3350 17 G PO PACK
17.0000 g | PACK | Freq: Every day | ORAL | Status: DC
  Administered 2015-04-10: 17 g via ORAL
  Filled 2015-04-10 (×2): qty 1

## 2015-04-10 MED ORDER — POLYETHYLENE GLYCOL 3350 17 G PO PACK
17.0000 g | PACK | Freq: Every day | ORAL | Status: DC
  Administered 2015-04-10 – 2015-04-12 (×3): 17 g via ORAL
  Filled 2015-04-10 (×3): qty 1

## 2015-04-10 MED ORDER — MORPHINE SULFATE 2 MG/ML IJ SOLN: 1.0000 mg | INTRAMUSCULAR | Status: DC | PRN

## 2015-04-10 MED ORDER — SENNOSIDES-DOCUSATE SODIUM 8.6-50 MG PO TABS
1.0000 | ORAL_TABLET | Freq: Two times a day (BID) | ORAL | Status: DC
  Administered 2015-04-10 – 2015-04-12 (×5): 1 via ORAL
  Filled 2015-04-10 (×6): qty 1

## 2015-04-10 NOTE — Progress Notes (Signed)
\  Patient noted to have errythema BLE, especially LLE approximately 1/2 up from foot to knee.  Expresses that this causes him pain and increased difficulty with ambulation.

## 2015-04-10 NOTE — Progress Notes (Signed)
[facility] PROGRESS NOTE  DEXTON ZWILLING ZTI:[id number] DOB: 03-12-1934 DOA: 04/08/2015 PCP: Glenda Chroman., MD  Assessment/Plan: 1. Abd pain -etiology unclear -POD 4 following endovascular stent graft for AAA -CT mostly unremarkable, patent graft and small endoleak- unclear significance, afebrile, WBC normal now -VVS consult requested 6/4 -supportive care, diet, anti-emetics -reports having BMs after discharge, none yesterday will add senokot and miralax  2. Hyperkalemia -missed HD Friday -Renal following, gets HD M & F at [facility]  3. ESRD on HD M&F -Renal following  4. HTN  5. P.Afib/flutter -continue diltiazem, amiodarone, ASA -not on coumadin per patient preference, per Cards notes  6. Chronic LLL edema -monitor  DVT proph: SCDs, has heparin allergy  Code Status: Full Code Family Communication: none at bedside Disposition Plan: home when improved   Consultants:  Renal  VVS pending  HPI/Subjective: Still complains of diffuse abd pain and nausea-improving, normal BM 6/3 but none yesterday  Objective: Filed Vitals:   04/10/15 0445  BP: 110/52  Pulse: 64  Temp: 98.4 F (36.9 C)  Resp: 16    Intake/Output Summary (Last 24 hours) at 04/10/15 0813 Last data filed at 04/10/15 0500  Gross per 24 hour  Intake    720 ml  Output   2500 ml  Net  -1780 ml   Filed Weights   04/08/15 2102 04/09/15 0631  Weight: 83.915 kg (185 lb) 93 kg (205 lb 0.4 oz)    Exam:   General:  AAOx3, no distress  Cardiovascular: S1S2/RRR  Respiratory: CTAB  Abdomen: soft, NT, ND, BS present  Musculoskeletal: L leg with 2plus edema-chronic per pt   Data Reviewed: Basic Metabolic Panel:  Recent Labs Lab 04/06/15 1937 04/06/15 1955 04/07/15 0430 04/08/15 2128 04/09/15 0849 04/10/15 0622  NA  --  139 136 136 137 139  K  --  4.1 4.5 5.6* 5.2* 4.3  CL  --  104 101 100* 103 101  CO2  --  25 24 20* 20* 26  GLUCOSE  --  76 75 110* 76 76  BUN  --  32* 36* 53*  56* 23*  CREATININE  --  7.51* 7.65* 9.82* 9.74* 5.63*  CALCIUM  --  7.6* 7.4* 8.1* 8.0* 7.9*  MG 1.8  --   --   --   --   --   PHOS  --   --  5.4*  --   --   --    Liver Function Tests:  Recent Labs Lab 04/07/15 0430 04/08/15 2128  AST  --  181*  ALT  --  59  ALKPHOS  --  116  BILITOT  --  1.2  PROT  --  6.7  ALBUMIN 2.0* 2.4*    Recent Labs Lab 04/08/15 2128  LIPASE 14*   No results for input(s): AMMONIA in the last 168 hours. CBC:  Recent Labs Lab 04/06/15 1937 04/07/15 0441 04/08/15 2128 04/09/15 0849 04/10/15 0622  WBC 7.1 7.1 10.7* 10.6* 9.4  NEUTROABS  --   --  8.6*  --   --   HGB 8.9* 8.5* 8.9* 8.8* 8.2*  HCT 28.7* 28.2* 28.7* 28.1* 26.7*  MCV 86.7 86.2 85.7 85.4 85.9  PLT 180 179 190 160 193   Cardiac Enzymes: No results for input(s): CKTOTAL, CKMB, CKMBINDEX, TROPONINI in the last 168 hours. BNP (last 3 results) No results for input(s): BNP in the last 8760 hours.  ProBNP (last 3 results) No results for input(s): PROBNP in the last 8760 hours.  CBG: No results for input(s): GLUCAP in the last 168 hours.  Recent Results (from the past 240 hour(s))  Surgical pcr screen     Status: Abnormal   Collection Time: 04/01/15  2:12 PM  Result Value Ref Range Status   MRSA, PCR NEGATIVE NEGATIVE Final   Staphylococcus aureus POSITIVE (A) NEGATIVE Final    Comment:        The Xpert SA Assay (FDA approved for NASAL specimens in patients over 59 years of age), is one component of a comprehensive surveillance program.  Test performance has been validated by [facility] for patients greater than or equal to 86 year old. It is not intended to diagnose infection nor to guide or monitor treatment.      Studies: Dg Chest 2 View  04/09/2015   CLINICAL DATA:  Fatigue. Abdominal aortic endograft surgery 3 days prior.  EXAM: CHEST  2 VIEW  COMPARISON:  04/06/2015  FINDINGS: Patient is post median sternotomy. The heart is enlarged. Bibasilar opacities,  likely atelectasis. There is unchanged mild blunting of left costophrenic angle. Unchanged peripheral opacity in the right mid lung. No pneumothorax. Vascular congestion is unchanged.  IMPRESSION: 1. Stable cardiomegaly and bibasilar opacities, likely atelectasis. 2. Unchanged vascular congestion. Unchanged peripheral opacity in the right midlung, pulmonary edema versus atelectasis.   Electronically Signed   By: Jeb Levering M.D.   On: 04/09/2015 03:18   Ct Cta Abd/pel W/cm &/or W/o Cm  04/09/2015   CLINICAL DATA:  Lower abdominal and groin pain. Post abdominal aortic endovascular stent graft 3 days prior. Fall. Decreased appetite.  EXAM: CTA ABDOMEN AND PELVIS wITHOUT AND WITH CONTRAST  TECHNIQUE: Multidetector CT imaging of the abdomen and pelvis was performed using the standard protocol during bolus administration of intravenous contrast. Multiplanar reconstructed images and MIPs were obtained and reviewed to evaluate the vascular anatomy.  CONTRAST:  145mL OMNIPAQUE IOHEXOL 350 MG/ML SOLN  COMPARISON:  CT 03/29/2015  FINDINGS: Small bilateral pleural effusions appears chronic. There is adjacent atelectasis in the lower lobe. Minimal ground-glass opacity in the right middle lobe. The heart is enlarged. There is contrast refluxing into the hepatic veins and IVC. There is no pericardial effusion.  Patient is post aorta bi-iliac stent graft of abdominal aortic aneurysm. There is a small focus of air in the aneurysm sac. Small amount of extraluminal contrast is seen in the region of the limb bifurcation. The aneurysm sac is not significantly changed in size, greatest dimension 6.9 cm, preoperative the 7.1 cm. There is fluid distally in both iliac limbs. The celiac and superior mesenteric arteries remain patent, with proximal stenoses. There are 2 left renal arteries, on both remain patent. Single right renal artery remains patent.  Atherosclerotic calcification of the external iliac arteries. No evidence  pseudoaneurysm formation. Mild soft tissue stranding about the I femoral vessels, may reflect minimal hematoma, no extravasation.  No focal hepatic lesion. The spleen is normal in size. Pancreas and adrenal glands are normal for arterial phase imaging. Small amount perihepatic ascites, increased in degree from prior. Small amount perihilar splenic ascites is new. There is vicarious excretion of contrast in the gallbladder from prior exams. Cortical thinning of both kidneys.  The stomach is decompressed. There is no duodenum wall thickening. Probable duodenum diverticulum. There is no evidence of aortoenteric fistula or IV contrast in the bowel. There are no dilated or thickened bowel loops. Small volume of colonic stool without colonic wall thickening. Diverticulosis of the distal colon without diverticulitis.  Excreted  intravenous contrast within the urinary bladder from prior contrast administration. Small amount of free fluid in the pelvis. Brachytherapy seeds in the prostate gland.  There are no acute or suspicious osseous abnormalities.  Review of the MIP images confirms the above findings.  IMPRESSION: 1. Post recent aorto bi-iliac stent graft,. Unchanged size of the aneurysm sac, the graft is patent. There is a small endoleak, suspected type 2 with filling from the lumbar arteries, less likely type 3. Small focus of air in the aneurysm sac is likely procedure on etiology, there is no evidence of aortoenteric fistula. 2. No CT findings of pseudoaneurysm. Minimal soft tissue stranding about the right femoral vessels, likely postprocedural. 3. Findings suggestive of right heart failure, with contrast refluxing into the IVC and hepatic veins. Intra-abdominal ascites has increased from preoperative exam. 4. Additional chronic findings as described.   Electronically Signed   By: Jeb Levering M.D.   On: 04/09/2015 03:56    Scheduled Meds: . amiodarone  200 mg Oral Daily  . aspirin EC  325 mg Oral Daily  .  atorvastatin  80 mg Oral q1800  . diltiazem  30 mg Oral BID  . levothyroxine  125 mcg Oral QPM  . meclizine  25 mg Oral BID  . multivitamin  1 tablet Oral QPM  . pantoprazole  40 mg Oral BID  . polyethylene glycol  17 g Oral Daily  . senna-docusate  1 tablet Oral BID   Continuous Infusions:  Antibiotics Given (last 72 hours)    None      Principal Problem:   Abdominal pain Active Problems:   ESRD on dialysis   Status post AAA (abdominal aortic aneurysm) repair   History of repair of aneurysm of abdominal aorta using endovascular stent graft   Hyperkalemia   Absolute anemia    Time spent: 31min    Ilias Stcharles  [facility] Pager [phone]. If 7PM-7AM, please contact night-coverage at [email or web address], password [facility] 04/10/2015, 8:13 AM  LOS: 1 day

## 2015-04-10 NOTE — Progress Notes (Addendum)
  [facility] Progress Note  HPI: Patient is s/p endovascular stent graft repair of infrarenal AAA, right femoral endarterectomy on 04/06/15, discharged home on POD 1, who presents to the [facility] ED one day after discharge with abdominal pain. He reports some minor abdominal pain at time of discharge that significantly worsened after discharge. He also complains of nausea. He has not had a BM since surgery. He has been passing flatus. No fever or chills. He also sustained a fall due to his "legs" giving out. He has chronic generalized weakness. No injury from his fall.  Objective Filed Vitals:   04/10/15 0858  BP: 119/46  Pulse: 66  Temp: 98.9 F (37.2 C)  Resp: 18    Intake/Output Summary (Last 24 hours) at 04/10/15 1017 Last data filed at 04/10/15 0859  Gross per 24 hour  Intake    720 ml  Output   2500 ml  Net  -1780 ml   General: resting in bed in NAD CV: RRR Pulm: non-labored Abd: normoactive bowel sounds, soft, non distended, diffuse tenderness to light palpation, guarding Incisions: Right groin incision c/d/i, left groin sheath site without hematoma Extremities: Feet warm and well perfused bilatearlly  Assessment/Planning: 81 y.o. male is s/p: EVAR and right femoral endarterectomy 04/06/15, readmitted with abdominal pain.   CTA abd/pelvis with stent graft patent and in good position. There is a small type II endoleak, consistent with what was seen on completion arteriogram. No changes in aneurysm sac. Abdominal pain not likely related to recent surgery. He is afebrile without leukocytosis. He is passing flatus but has not had a BM in several days. This may be the cause of his pain. Continue bowel regimen. Dr. Oneida Alar to see patient.    Alvia Grove 04/10/2015 10:17 AM --  Laboratory CBC    Component Value Date/Time   WBC 9.4 04/10/2015 0622   HGB 8.2* 04/10/2015 0622   HCT 26.7* 04/10/2015 0622   PLT 193 04/10/2015 0622    BMET    Component  Value Date/Time   NA 139 04/10/2015 0622   K 4.3 04/10/2015 0622   CL 101 04/10/2015 0622   CO2 26 04/10/2015 0622   GLUCOSE 76 04/10/2015 0622   BUN 23* 04/10/2015 0622   CREATININE 5.63* 04/10/2015 0622   CALCIUM 7.9* 04/10/2015 0622   GFRNONAA 9* 04/10/2015 0622   GFRAA 10* 04/10/2015 0622    COAG Lab Results  Component Value Date   INR 1.32 04/06/2015   INR 1.23 04/06/2015   INR 1.27 04/01/2015   No results found for: PTT  Antibiotics Anti-infectives    None       Virgina Jock, PA-C [facility] Office: [phone] Pager: [phone] 04/10/2015 10:17 AM  Pt primarily with right upper quadrant pain on palpation otherwise seems comfortable No BM in 5 days CT angio reviewed.  Type 2 endoleak which is frequently seen and usually has benign course.  These are followed long term with CT as outpt.  This is not the cause of his abdominal pain and his aneurysm is well excluded. No leukocytosis Blood loss and chronic anemia  Would Rx constipation which hopefully will improve abdominal pain  Ruta Hinds, MD [facility] [place] Office: [phone] Pager: [phone]

## 2015-04-10 NOTE — Progress Notes (Signed)
Subjective:  Tolerated HD yest. / co some abd discomfort still no BM past several days Miralax and dulcolax pending  Objective Vital signs in last 24 hours: Filed Vitals:   04/09/15 2030 04/09/15 2100 04/09/15 2105 04/10/15 0445  BP: 115/48 113/55 113/48 110/52  Pulse: 64 62 62 64  Temp:    98.4 F (36.9 C)  TempSrc:    Oral  Resp:    16  Height:      Weight:      SpO2:    92%   Weight change:   Physical Exam: General=Alert, NAD  Lungs= CTA bilat Card=RRR no MRG, sternal scar Abd =soft, diffuse mild tenderness, +BS no rebound Etrem =1+ LLE edema w mild calf erythema, no RLE edema  Dialysis= LUA AVF pos bruit     OP HD: MWF [facility] 4h 85kg 2/2.25 bath Heparin none (allergic) LUA AVF Per staff patient only goes on Mondays' and Friday's for HD   Problem/Plan: 1. Abd pain , unclear cause/ Dr.Fiels and admit team RX = Fleets enama change to taper wwter with ESRD pt.  Mira lax and dulcolax supp Had good BM today after lax/ enema and abd feels much better. Prob issue was constipation.  2. S/P stent graft to AAA POD 3 3. ESRD on HD missed one HD per wk routinely per staff- hd yesterday on schedule 4. Volume has some LE edema, wts inaccurate 5. Anemia  hgb 8.2 /  Start Aranesp 60 q weekly 6. MBD= phos 5.4 corec ca= 9.2  Per pt Dr. Lynnette Caffey stopped his binder recenly  Fu am labs 7. Afib / flutter on amio/ dilt; has junctional rhythm on EKG today. No coumadin pt preference  Ernest Haber, PA-C [facility] [phone] 04/10/2015,8:35 AM  LOS: 1 day   Pt seen, examined, agree w assess/plan as above. Plan HD tomorrow.   Kelly Splinter MD pager [phone]    cell [phone] 04/10/2015, 4:57 PM     Labs: Basic Metabolic Panel:  Recent Labs Lab 04/07/15 0430 04/08/15 2128 04/09/15 0849 04/10/15 0622  NA 136 136 137 139  K 4.5 5.6* 5.2* 4.3  CL 101 100* 103 101  CO2 24 20* 20* 26  GLUCOSE 75 110* 76 76  BUN 36* 53* 56* 23*  CREATININE  7.65* 9.82* 9.74* 5.63*  CALCIUM 7.4* 8.1* 8.0* 7.9*  PHOS 5.4*  --   --   --    Liver Function Tests:  Recent Labs Lab 04/07/15 0430 04/08/15 2128  AST  --  181*  ALT  --  59  ALKPHOS  --  116  BILITOT  --  1.2  PROT  --  6.7  ALBUMIN 2.0* 2.4*    Recent Labs Lab 04/08/15 2128  LIPASE 14*   CBC:  Recent Labs Lab 04/06/15 1937 04/07/15 0441 04/08/15 2128 04/09/15 0849 04/10/15 0622  WBC 7.1 7.1 10.7* 10.6* 9.4  NEUTROABS  --   --  8.6*  --   --   HGB 8.9* 8.5* 8.9* 8.8* 8.2*  HCT 28.7* 28.2* 28.7* 28.1* 26.7*  MCV 86.7 86.2 85.7 85.4 85.9  PLT 180 179 190 160 193   Cardiac Enzymes: No results for input(s): CKTOTAL, CKMB, CKMBINDEX, TROPONINI in the last 168 hours. CBG: No results for input(s): GLUCAP in the last 168 hours.  Medications:   . amiodarone  200 mg Oral Daily  . aspirin EC  325 mg Oral Daily  . atorvastatin  80 mg Oral q1800  . diltiazem  30 mg Oral BID  . levothyroxine  125 mcg Oral QPM  . meclizine  25 mg Oral BID  . multivitamin  1 tablet Oral QPM  . pantoprazole  40 mg Oral BID  . polyethylene glycol  17 g Oral Daily  . senna-docusate  1 tablet Oral BID

## 2015-04-11 ENCOUNTER — Inpatient Hospital Stay (HOSPITAL_COMMUNITY): Payer: Medicare Other

## 2015-04-11 DIAGNOSIS — R1011 Right upper quadrant pain: Secondary | ICD-10-CM

## 2015-04-11 LAB — CBC
HCT: 27.6 % — ABNORMAL LOW (ref 39.0–52.0)
Hemoglobin: 8.5 g/dL — ABNORMAL LOW (ref 13.0–17.0)
MCH: 26.5 pg (ref 26.0–34.0)
MCHC: 30.8 g/dL (ref 30.0–36.0)
MCV: 86 fL (ref 78.0–100.0)
Platelets: 227 10*3/uL (ref 150–400)
RBC: 3.21 MIL/uL — ABNORMAL LOW (ref 4.22–5.81)
RDW: 19.1 % — ABNORMAL HIGH (ref 11.5–15.5)
WBC: 9.1 10*3/uL (ref 4.0–10.5)

## 2015-04-11 MED ORDER — DOXERCALCIFEROL 4 MCG/2ML IV SOLN
0.5000 ug | INTRAVENOUS | Status: DC
  Administered 2015-04-11: 0.5 ug via INTRAVENOUS
  Filled 2015-04-11: qty 2

## 2015-04-11 MED ORDER — DARBEPOETIN ALFA 60 MCG/0.3ML IJ SOSY
60.0000 ug | PREFILLED_SYRINGE | INTRAMUSCULAR | Status: DC
  Administered 2015-04-11: 60 ug via INTRAVENOUS
  Filled 2015-04-11: qty 0.3

## 2015-04-11 MED ORDER — DOXERCALCIFEROL 4 MCG/2ML IV SOLN
INTRAVENOUS | Status: AC
  Filled 2015-04-11: qty 2

## 2015-04-11 MED ORDER — DARBEPOETIN ALFA 60 MCG/0.3ML IJ SOSY
PREFILLED_SYRINGE | INTRAMUSCULAR | Status: AC
  Filled 2015-04-11: qty 0.3

## 2015-04-11 NOTE — Progress Notes (Signed)
[facility] [place]  Subjective  - abdomen pain about the same, had BM   Objective 122/47 64 98.1 F (36.7 C) (Oral) 14 94%  Intake/Output Summary (Last 24 hours) at 04/11/15 1341 Last data filed at 04/11/15 0917  Gross per 24 hour  Intake    220 ml  Output      0 ml  Net    220 ml   Mild/moderate right side abdominal pain Groins healing  Assessment/Planning: For RUQ [place] today Type II endoleak outpt followup  Will continue to follow  Ruta Hinds 04/11/2015 1:41 PM --  Laboratory Lab Results:  Recent Labs  04/10/15 0622 04/11/15 0930  WBC 9.4 9.1  HGB 8.2* 8.5*  HCT 26.7* 27.6*  PLT 193 227   BMET  Recent Labs  04/09/15 0849 04/10/15 0622  NA 137 139  K 5.2* 4.3  CL 103 101  CO2 20* 26  GLUCOSE 76 76  BUN 56* 23*  CREATININE 9.74* 5.63*  CALCIUM 8.0* 7.9*    COAG Lab Results  Component Value Date   INR 1.32 04/06/2015   INR 1.23 04/06/2015   INR 1.27 04/01/2015   No results found for: PTT

## 2015-04-11 NOTE — Consult Note (Signed)
Pt seen on HD.  Ap 110 Vp 180.  BFR 400.  Will resume ESA and hectorol.  Still CO abd pain, for [place] today to look at GB.  EPO 10500U, hectorol .33mcg.  4hr, BFR 400, DW 85 KG

## 2015-04-11 NOTE — Progress Notes (Signed)
[facility] PROGRESS NOTE  [name] VVO:[id number] DOB: [birth year] DOA: 04/08/2015 PCP: Glenda Chroman., MD  Assessment/Plan: 1. Abd pain -got minimally better yesterday and worsened again, unable to eat very much -POD5 following endovascular stent graft for AAA -CT mostly unremarkable, patent graft and small endoleak- unclear significance, afebrile, WBC normal now -VVS consulted -supportive care, diet, anti-emetics -had Bms after enema without much relief -6/6; with more RUQ tenderness, will check RUQ [place] to eval for GB disease  2. Hyperkalemia -resolved with HD   3. ESRD on HD M&F -Renal following, gets HD M and F at [facility]  4. HTN  5. P.Afib/flutter -continue diltiazem, amiodarone, ASA -not on coumadin per patient preference, per Cards notes  6. Chronic LLL edema -monitor, check duplex   DVT proph: SCDs, has heparin allergy  Code Status: Full Code Family Communication: none at bedside Disposition Plan: home when improved   Consultants:  Renal  VVS pending  HPI/Subjective: Increased Abd pain again  Objective: Filed Vitals:   04/11/15 0603  BP: 122/47  Pulse: 67  Temp: 97.7 F (36.5 C)  Resp: 16    Intake/Output Summary (Last 24 hours) at 04/11/15 0824 Last data filed at 04/11/15 0600  Gross per 24 hour  Intake    600 ml  Output      0 ml  Net    600 ml   Filed Weights   04/08/15 2102 04/09/15 0631  Weight: 83.915 kg (185 lb) 93 kg (205 lb 0.4 oz)    Exam:   General:  AAOx3, no distress  Cardiovascular: S1S2/RRR  Respiratory: CTAB  Abdomen: soft, RuQ tender, ND, BS present  Musculoskeletal: L leg with 1plus edema-chronic per pt   Data Reviewed: Basic Metabolic Panel:  Recent Labs Lab 04/06/15 1937 04/06/15 1955 04/07/15 0430 04/08/15 2128 04/09/15 0849 04/10/15 0622  NA  --  139 136 136 137 139  K  --  4.1 4.5 5.6* 5.2* 4.3  CL  --  104 101 100* 103 101  CO2  --  25 24 20* 20* 26  GLUCOSE  --  76 75 110* 76 76   BUN  --  32* 36* 53* 56* 23*  CREATININE  --  7.51* 7.65* 9.82* 9.74* 5.63*  CALCIUM  --  7.6* 7.4* 8.1* 8.0* 7.9*  MG 1.8  --   --   --   --   --   PHOS  --   --  5.4*  --   --   --    Liver Function Tests:  Recent Labs Lab 04/07/15 0430 04/08/15 2128  AST  --  181*  ALT  --  59  ALKPHOS  --  116  BILITOT  --  1.2  PROT  --  6.7  ALBUMIN 2.0* 2.4*    Recent Labs Lab 04/08/15 2128  LIPASE 14*   No results for input(s): AMMONIA in the last 168 hours. CBC:  Recent Labs Lab 04/06/15 1937 04/07/15 0441 04/08/15 2128 04/09/15 0849 04/10/15 0622  WBC 7.1 7.1 10.7* 10.6* 9.4  NEUTROABS  --   --  8.6*  --   --   HGB 8.9* 8.5* 8.9* 8.8* 8.2*  HCT 28.7* 28.2* 28.7* 28.1* 26.7*  MCV 86.7 86.2 85.7 85.4 85.9  PLT 180 179 190 160 193   Cardiac Enzymes: No results for input(s): CKTOTAL, CKMB, CKMBINDEX, TROPONINI in the last 168 hours. BNP (last 3 results) No results for input(s): BNP in the last 8760 hours.  ProBNP (  last 3 results) No results for input(s): PROBNP in the last 8760 hours.  CBG: No results for input(s): GLUCAP in the last 168 hours.  Recent Results (from the past 240 hour(s))  Surgical pcr screen     Status: Abnormal   Collection Time: 04/01/15  2:12 PM  Result Value Ref Range Status   MRSA, PCR NEGATIVE NEGATIVE Final   Staphylococcus aureus POSITIVE (A) NEGATIVE Final    Comment:        The Xpert SA Assay (FDA approved for NASAL specimens in patients over 11 years of age), is one component of a comprehensive surveillance program.  Test performance has been validated by [facility] for patients greater than or equal to 69 year old. It is not intended to diagnose infection nor to guide or monitor treatment.      Studies: No results found.  Scheduled Meds: . amiodarone  200 mg Oral Daily  . aspirin EC  325 mg Oral Daily  . atorvastatin  80 mg Oral q1800  . diltiazem  30 mg Oral BID  . levothyroxine  125 mcg Oral QPM  . meclizine  25  mg Oral BID  . multivitamin  1 tablet Oral QPM  . pantoprazole  40 mg Oral BID  . polyethylene glycol  17 g Oral Daily  . senna-docusate  1 tablet Oral BID   Continuous Infusions:  Antibiotics Given (last 72 hours)    None      Principal Problem:   Abdominal pain Active Problems:   ESRD on dialysis   Status post AAA (abdominal aortic aneurysm) repair   History of repair of aneurysm of abdominal aorta using endovascular stent graft   Hyperkalemia   Absolute anemia    Time spent: 18min    Jurgen Groeneveld  [facility] Pager [phone]. If 7PM-7AM, please contact night-coverage at [email or web address], password [email or web address] 04/11/2015, 8:24 AM  LOS: 2 days

## 2015-04-12 ENCOUNTER — Inpatient Hospital Stay (HOSPITAL_COMMUNITY): Payer: Medicare Other

## 2015-04-12 DIAGNOSIS — M7989 Other specified soft tissue disorders: Secondary | ICD-10-CM

## 2015-04-12 LAB — CBC
HEMATOCRIT: 30.9 % — AB (ref 39.0–52.0)
Hemoglobin: 9.2 g/dL — ABNORMAL LOW (ref 13.0–17.0)
MCH: 26 pg (ref 26.0–34.0)
MCHC: 29.8 g/dL — ABNORMAL LOW (ref 30.0–36.0)
MCV: 87.3 fL (ref 78.0–100.0)
Platelets: 237 10*3/uL (ref 150–400)
RBC: 3.54 MIL/uL — ABNORMAL LOW (ref 4.22–5.81)
RDW: 19.9 % — ABNORMAL HIGH (ref 11.5–15.5)
WBC: 8.9 10*3/uL (ref 4.0–10.5)

## 2015-04-12 LAB — COMPREHENSIVE METABOLIC PANEL
ALBUMIN: 2.1 g/dL — AB (ref 3.5–5.0)
ALT: 63 U/L (ref 17–63)
AST: 122 U/L — AB (ref 15–41)
Alkaline Phosphatase: 150 U/L — ABNORMAL HIGH (ref 38–126)
Anion gap: 14 (ref 5–15)
BUN: 18 mg/dL (ref 6–20)
CO2: 26 mmol/L (ref 22–32)
CREATININE: 4.72 mg/dL — AB (ref 0.61–1.24)
Calcium: 8.1 mg/dL — ABNORMAL LOW (ref 8.9–10.3)
Chloride: 98 mmol/L — ABNORMAL LOW (ref 101–111)
GFR calc non Af Amer: 11 mL/min — ABNORMAL LOW (ref >60)
GFR, EST AFRICAN AMERICAN: 12 mL/min — AB (ref >60)
Glucose, Bld: 77 mg/dL (ref 65–99)
Potassium: 3.9 mmol/L (ref 3.5–5.1)
Sodium: 138 mmol/L (ref 135–145)
TOTAL PROTEIN: 6.8 g/dL (ref 6.5–8.1)
Total Bilirubin: 1.1 mg/dL (ref 0.3–1.2)

## 2015-04-12 MED ORDER — POLYETHYLENE GLYCOL 3350 17 G PO PACK: 17.0000 g | PACK | Freq: Every day | ORAL | Status: AC

## 2015-04-12 MED ORDER — HYDROCODONE-ACETAMINOPHEN 5-325 MG PO TABS: 1.0000 | ORAL_TABLET | Freq: Four times a day (QID) | ORAL | Status: AC | PRN

## 2015-04-12 MED ORDER — HYDROCERIN EX CREA
TOPICAL_CREAM | Freq: Two times a day (BID) | CUTANEOUS | Status: DC
  Administered 2015-04-12: 10:00:00 via TOPICAL
  Filled 2015-04-12: qty 113

## 2015-04-12 NOTE — Care Management Note (Signed)
Case Management Note  Patient Details  Name: [name] MRN: [id number] Date of Birth: 1934/11/03  Subjective/Objective:                 CM following for progression and d/c planning.   Action/Plan:  No d/c needs identified.   Expected Discharge Date:  04/13/15               Expected Discharge Plan:  Home/Self Care  In-House Referral:  NA  Discharge planning Services  NA  Post Acute Care Choice:  NA Choice offered to:  NA  DME Arranged:    DME Agency:     HH Arranged:    HH Agency:     Status of Service:  Completed, signed off  Medicare Important Message Given:  Yes Date Medicare IM Given:  04/12/15 Medicare IM give by:  Jasmine Pang RN MPH, case manager, [phone] Date Additional Medicare IM Given:    Additional Medicare Important Message give by:     If discussed at [place] of Stay Meetings, dates discussed:    Additional Comments:  Adron Bene, RN 04/12/2015, 11:38 AM

## 2015-04-12 NOTE — Progress Notes (Signed)
*  Preliminary Results* Bilateral lower extremity venous duplex completed. Bilateral lower extremities are negative for deep vein thrombosis. There is no evidence of Baker's cyst bilaterally.  04/12/2015  Maudry Mayhew, RVT, RDCS, RDMS

## 2015-04-12 NOTE — Progress Notes (Signed)
04/12/2015 11:08 AM  Reviewed discharge instructions and medication list with patient and his wife.  Pt and wife verbalized understanding.  IV and telemetry removed.  At this time, patient is getting dressed, to call when ready to discharge.  Family at bedside. Princella Pellegrini

## 2015-04-12 NOTE — Consult Note (Signed)
WOC wound consult note Reason for Consult: LLE wound. Pt and family report VVS had explained that wound is related to vascular status.  Closed partial thickness skin damage to the left pretibial area. Some redness noted bilaterally however this is resolving.  No pain, no edema.  Dermatitis with some excoriation on the LLE. Family reports he "scratches his legs".   Wound type: partial thickness skin abrasion, healing  Measurement: 0.6cm x 0.5cm x 0 Wound bed: closed, scabbed Drainage (amount, consistency, odor) none Periwound: intact with dermatitis Dressing procedure/placement/frequency: No topical care needed at this time, will add emollient for dermatitis.  Instructed family and patient to apply to the bilateral LE, avoiding between the toes. Verbalized understanding.  Discussed POC with patient and bedside nurse.  Re consult if needed, will not follow at this time. Thanks  Caydin Yeatts [place], [place] [phone])

## 2015-04-12 NOTE — Discharge Summary (Signed)
Physician Discharge Summary  [name] CZY:[id number] DOB: October 13, 1934 DOA: 04/08/2015  PCP: Glenda Chroman., MD  Admit date: 04/08/2015 Discharge date: 04/12/2015  Time spent: 45 minutes  Recommendations for Outpatient Follow-up:  1. PCP Dr.Vyas in 1 week 2. Dr.Fields in 1 week  Discharge Diagnoses:  Principal Problem:   Abdominal pain     History of repair of aneurysm of abdominal aorta using endovascular stent graft   ESRD on dialysis   Status post AAA (abdominal aortic aneurysm) repair   Hyperkalemia   Absolute anemia   Discharge Condition: stable  Diet recommendation: Renal  Filed Weights   04/11/15 0925 04/11/15 1331 04/11/15 2000  Weight: 83.3 kg (183 lb 10.3 oz) 79.1 kg (174 lb 6.1 oz) 81.2 kg (179 lb 0.2 oz)    History of present illness:  Chief Complaint: Abd pain HPI: [name] is a 81 y.o. male ESRD dialysis MWF, who was POD # 3 from [facility] of AAA. He presented to the ED with c/o constant, worsening, abdominal pain and nausea since the time of surgery. He did not have a BM 6/3 but has had BMs since surgery. He has not been eating at home. His dialysis schedule got messed up because of surgery he says and he missed dialysis Friday.   Hospital Course:  1. Abd pain -started immediate post op following endovascular stent graft for AAA -CT mostly unremarkable, patent graft and small endoleak -remained  afebrile, WBC normal -Seen by Dr.Fields, didn't think small endoleak was the cause of his pain and recommended outpatient FU for this. -workup unremarkable, suspect constipation and post op discomfort from stent contributing -improved with supportive care, pain control, anti-emetics -also received enemas with modest improvement -On 6/6; had some RUQ tenderness, RUQ [place] negative for galls stones and no cholecystitis -clinically improved since yesterday evening, without pain and tolerating diet -discharged home in stable condition with close FU with [facility]  2.  Hyperkalemia -resolved with HD  3. ESRD on HD M&F -Renal following, gets HD M and F at [facility]  4. HTN  5. P.Afib/flutter -continue diltiazem, amiodarone, ASA -not on coumadin per patient preference, per Cardiologists notes  6. Chronic LLL edema -duplex negative for DVT  Consultations:  [facility]  Renal  Discharge Exam: Filed Vitals:   04/12/15 0400  BP: 127/50  Pulse: 70  Temp: 97.2 F (36.2 C)  Resp: 18    General: AAOx3 Cardiovascular: S1S2/RRR Respiratory: CTAB  Discharge Instructions   Discharge Instructions    Discharge instructions    Complete by:  As directed   Renal Diet     Increase activity slowly    Complete by:  As directed           Current Discharge Medication List    START taking these medications   Details  HYDROcodone-acetaminophen (NORCO/VICODIN) 5-325 MG per tablet Take 1 tablet by mouth every 6 (six) hours as needed for severe pain. Qty: 30 tablet, Refills: 0    polyethylene glycol (MIRALAX / GLYCOLAX) packet Take 17 g by mouth daily. Qty: 14 each, Refills: 0      CONTINUE these medications which have NOT CHANGED   Details  acetaminophen (TYLENOL) 325 MG tablet Take 1-2 tablets (325-650 mg total) by mouth every 6 (six) hours as needed for pain.    amiodarone (PACERONE) 200 MG tablet Take 1 tablet (200 mg total) by mouth daily. Qty: 30 tablet, Refills: 6    aspirin EC 325 MG tablet Take 1 tablet (325 mg total)  by mouth daily.    atorvastatin (LIPITOR) 80 MG tablet Take 1 tablet (80 mg total) by mouth daily. Qty: 30 tablet, Refills: 6    B Complex-C-Folic Acid (DIALYVITE 016) 0.8 MG TABS Take 1 tablet by mouth every evening.     diltiazem (CARDIZEM) 30 MG tablet Take 1 tablet (30 mg total) by mouth 2 (two) times daily. Qty: 60 tablet, Refills: 6    hydrOXYzine (ATARAX/VISTARIL) 25 MG tablet Take 1 tablet (25 mg total) by mouth 3 (three) times daily as needed for itching. Qty: 90 tablet, Refills: 0    levothyroxine (SYNTHROID,  LEVOTHROID) 125 MCG tablet Take 125 mcg by mouth every evening.  Refills: 11    meclizine (ANTIVERT) 25 MG tablet Take 25 mg by mouth 2 (two) times daily.     ondansetron (ZOFRAN) 4 MG tablet Take 4 mg by mouth every 8 (eight) hours as needed for nausea.     pantoprazole (PROTONIX) 40 MG tablet Take 40 mg by mouth 2 (two) times daily.    torsemide (DEMADEX) 20 MG tablet Take 2 tablets (40 mg total) by mouth 2 (two) times daily. Qty: 120 tablet, Refills: 6    traMADol (ULTRAM) 50 MG tablet Take 1 tablet (50 mg total) by mouth every 8 (eight) hours as needed. Qty: 30 tablet, Refills: 0       Allergies  Allergen Reactions  . 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 Schedule an appointment as soon as possible for a visit in 1 week.   Specialty:  Internal Medicine   Contact information:   [address] [place] [address] [phone]        The results of significant diagnostics from this hospitalization (including imaging, microbiology, ancillary and laboratory) are listed below for reference.    Significant Diagnostic Studies: Dg Chest 2 View  04/09/2015   CLINICAL DATA:  Fatigue. Abdominal aortic endograft surgery 3 days prior.  EXAM: CHEST  2 VIEW  COMPARISON:  04/06/2015  FINDINGS: Patient is post median sternotomy. The heart is enlarged. Bibasilar opacities, likely atelectasis. There is unchanged mild blunting of left costophrenic angle. Unchanged peripheral opacity in the right mid lung. No pneumothorax. Vascular congestion is unchanged.  IMPRESSION: 1. Stable cardiomegaly and bibasilar opacities, likely atelectasis. 2. Unchanged vascular congestion. Unchanged peripheral opacity in the right midlung, pulmonary edema versus atelectasis.   Electronically Signed   By: Jeb Levering M.D.   On: 04/09/2015 03:18   Ct Angio Ao+bifem W/cm &/or Wo/cm  03/30/2015   CLINICAL DATA:  Abdominal aortic aneurysm,  preop. Left lower extremity pain, swelling, redness x2 years. Renal failure, on dialysis.  EXAM: CT ANGIOGRAM ABDOMINAL AORTA AND BILATERAL LOWER EXTREMITIES WITH CONTRAST  TECHNIQUE: Axial helical CT of the abdominal aorta and bilateral lower extremities after power injection of intravenous contrast. Coronal and sagittal reconstructions were generated for vascular evaluation.  CONTRAST:  135mL OMNIPAQUE IOHEXOL 350 MG/ML SOLN  COMPARISON:  02/17/2015  FINDINGS: Aorta: Moderate partially calcified atheromatous plaque in the visualized distal descending thoracic and suprarenal segments. Fusiform infrarenal aneurysm with focal outpouching from the left lateral wall as before, 7.1 cm maximum transverse diameter at the level of the focal outpouching (previously 7.1), 6.6 x 5.6 cm inferior to this level, (previously 6.4 x 5.6 cm by my measurement). Aorta tapers to a diameter of 3.2 cm above the bifurcation. There is a small amount of mural  thrombus in the aneurysmal segment. No dissection or stenosis.  Celiac axis: Calcified ostial plaque without high-grade stenosis. Patent distally.  Superior mesenteric: Calcified ostial plaque resulting in short segment stenosis of at least mild severity, patent distally with classic distal branch anatomy.  Left renal: Duplicated, codominant. There is calcified ostial plaque in the superior moiety resulting in at least mild short segment stenosis, patent distally. There is calcified plaque in the proximal 2 cm of the inferior left renal artery without high-grade stenosis.  Right renal: Single, with calcified eccentric ostial plaque extending over a length of at least 15 mm resulting in at least moderate stenosis, patent distally.  Inferior mesenteric:  Patent, arising from the aneurysmal segment.  Left iliac: Common iliac artery 14 mm diameter, with moderate calcified plaque, no stenosis or dissection. Origin stenosis of the external iliac artery of possible hemodynamic significance,  patent distally. Mild calcified ostial stenosis of the internal iliac artery, patent distally.  Right iliac: Ectatic common iliac, 14 mm diameter, with moderate calcified nonocclusive plaque throughout. Calcified plaque at its bifurcation resulting in probable hemodynamically significant stenosis at the origins of the internal and external iliac arteries, both of which are patent distally with some eccentric calcified plaque.  Left lower extremity: Eccentric nonocclusive calcified plaque in the common femoral artery. Profunda femoris branches patent. Scattered plaque in the SFA and popliteal artery without high-grade stenosis. Anterior tibial artery has a high-grade stenosis or short segment occlusion several cm beyond its origin. Diseased probably contiguous peroneal and posterior tibial runoff.  Right lower extremity: Eccentric nonocclusive calcified plaque through the common femoral artery. Profunda femoris branches patent. Mild scattered plaque in the SFA a with mild stenosis distally. Scattered nonocclusive plaque through the popliteal artery. Anterior tibial artery occludes proximally. There is diseased probably contiguous peroneal and posterior tibial runoff.  Venous findings:  Dedicated venous phase imaging not obtained.  Review of the MIP images confirms the above findings.  Nonvascular findings: Small bilateral pleural effusions. Patchy coarse opacities posteriorly in both lower lobes, increased since prior study. Previous median sternotomy. Unremarkable arterial phase evaluation of liver, spleen, adrenal glands, pancreas. Small amount of perihepatic ascites, new. Gallbladder physiologically distended. Bilateral renal parenchymal atrophy with small bilateral cysts, no hydronephrosis. Stomach, small bowel, and colon are nondilated. Normal appendix. Scattered sigmoid diverticula without adjacent inflammatory/ edematous change. Urinary bladder incompletely distended. Mild prostatic enlargement with metallic  clips or markers. There is a small amount of pelvic ascites, slightly increased. No free air. No adenopathy localized. Facet DJD in the lower lumbar spine.  IMPRESSION: 1. Slight interval increase in size of 7.1 cm infrarenal abdominal aortic aneurysm. 2. Bilateral renal and SMA origin stenoses of possible hemodynamic significance. 3. Origin stenoses of bilateral external iliac arteries, of possible hemodynamic significance. 4. Patent bilateral femoral-popliteal systems with diseased peroneal and posterior tibial runoff. 5. New small bilateral pleural effusions and bibasilar coarse pulmonary opacities. 6. Increase in perihepatic and pelvic ascites. 7. Additional stable ancillary nonvascular findings as above.   Electronically Signed   By: Lucrezia Europe M.D.   On: 03/30/2015 08:53   Dg Chest Port 1 View  04/06/2015   CLINICAL DATA:  Stent graft repair of abdominal aortic aneurysm. Postoperative radiograph.  EXAM: PORTABLE CHEST - 1 VIEW  COMPARISON:  07/15/2013.  FINDINGS: Cardiopericardial silhouette is enlarged, similar to prior. CABG/ median sternotomy. Aortic arch atherosclerosis. Monitoring leads project over the chest.  Bilateral basilar opacity is present, greater on the RIGHT when compared to the LEFT. There is  also opacity in the RIGHT mid lung. In the postprocedural setting, this is favored to represent atelectasis. There does appear to be pulmonary vascular congestion and the airspace disease in the RIGHT midlung potentially reflects a small area of asymmetric pulmonary edema.  IMPRESSION: 1. Cardiomegaly and CABG. 2. Bilateral basilar opacity favored to represent atelectasis. 3. Pulmonary vascular congestion suggesting volume overload. Possible edema in the RIGHT midlung.   Electronically Signed   By: Dereck Ligas M.D.   On: 04/06/2015 16:01   Ct Cta Abd/pel W/cm &/or W/o Cm  04/09/2015   CLINICAL DATA:  Lower abdominal and groin pain. Post abdominal aortic endovascular stent graft 3 days prior. Fall.  Decreased appetite.  EXAM: CTA ABDOMEN AND PELVIS wITHOUT AND WITH CONTRAST  TECHNIQUE: Multidetector CT imaging of the abdomen and pelvis was performed using the standard protocol during bolus administration of intravenous contrast. Multiplanar reconstructed images and MIPs were obtained and reviewed to evaluate the vascular anatomy.  CONTRAST:  132mL OMNIPAQUE IOHEXOL 350 MG/ML SOLN  COMPARISON:  CT 03/29/2015  FINDINGS: Small bilateral pleural effusions appears chronic. There is adjacent atelectasis in the lower lobe. Minimal ground-glass opacity in the right middle lobe. The heart is enlarged. There is contrast refluxing into the hepatic veins and IVC. There is no pericardial effusion.  Patient is post aorta bi-iliac stent graft of abdominal aortic aneurysm. There is a small focus of air in the aneurysm sac. Small amount of extraluminal contrast is seen in the region of the limb bifurcation. The aneurysm sac is not significantly changed in size, greatest dimension 6.9 cm, preoperative the 7.1 cm. There is fluid distally in both iliac limbs. The celiac and superior mesenteric arteries remain patent, with proximal stenoses. There are 2 left renal arteries, on both remain patent. Single right renal artery remains patent.  Atherosclerotic calcification of the external iliac arteries. No evidence pseudoaneurysm formation. Mild soft tissue stranding about the I femoral vessels, may reflect minimal hematoma, no extravasation.  No focal hepatic lesion. The spleen is normal in size. Pancreas and adrenal glands are normal for arterial phase imaging. Small amount perihepatic ascites, increased in degree from prior. Small amount perihilar splenic ascites is new. There is vicarious excretion of contrast in the gallbladder from prior exams. Cortical thinning of both kidneys.  The stomach is decompressed. There is no duodenum wall thickening. Probable duodenum diverticulum. There is no evidence of aortoenteric fistula or IV  contrast in the bowel. There are no dilated or thickened bowel loops. Small volume of colonic stool without colonic wall thickening. Diverticulosis of the distal colon without diverticulitis.  Excreted intravenous contrast within the urinary bladder from prior contrast administration. Small amount of free fluid in the pelvis. Brachytherapy seeds in the prostate gland.  There are no acute or suspicious osseous abnormalities.  Review of the MIP images confirms the above findings.  IMPRESSION: 1. Post recent aorto bi-iliac stent graft,. Unchanged size of the aneurysm sac, the graft is patent. There is a small endoleak, suspected type 2 with filling from the lumbar arteries, less likely type 3. Small focus of air in the aneurysm sac is likely procedure on etiology, there is no evidence of aortoenteric fistula. 2. No CT findings of pseudoaneurysm. Minimal soft tissue stranding about the right femoral vessels, likely postprocedural. 3. Findings suggestive of right heart failure, with contrast refluxing into the IVC and hepatic veins. Intra-abdominal ascites has increased from preoperative exam. 4. Additional chronic findings as described.   Electronically Signed   By: Threasa Beards  Ehinger M.D.   On: 04/09/2015 03:56   US Abdomen Limited Ruq  04/11/2015   CLINICAL DATA:  Right upper quadrant pain for 1 month  EXAM: US ABDOMEN LIMITED - RIGHT UPPER QUADRANT  COMPARISON:  04/09/2015  FINDINGS: Gallbladder:  No gallstones are identified. Considerable sludge is noted within the gallbladder. No increased wall thickness is noted.  Common bile duct:  Diameter: 4.5 mm  Liver:  No focal lesion identified. Within normal limits in parenchymal echogenicity.  Incidental note is made of minimal ascites similar to that seen on recent CT examination.  IMPRESSION: Minimal ascites.  Gallbladder sludge.   Electronically Signed   By: Inez Catalina M.D.   On: 04/11/2015 15:30    Microbiology: No results found for this or any previous visit  (from the past 240 hour(s)).   Labs: Basic Metabolic Panel:  Recent Labs Lab 04/06/15 1937  04/07/15 0430 04/08/15 2128 04/09/15 0849 04/10/15 0622 04/12/15 0640  NA  --   < > 136 136 137 139 138  K  --   < > 4.5 5.6* 5.2* 4.3 3.9  CL  --   < > 101 100* 103 101 98*  CO2  --   < > 24 20* 20* 26 26  GLUCOSE  --   < > 75 110* 76 76 77  BUN  --   < > 36* 53* 56* 23* 18  CREATININE  --   < > 7.65* 9.82* 9.74* 5.63* 4.72*  CALCIUM  --   < > 7.4* 8.1* 8.0* 7.9* 8.1*  MG 1.8  --   --   --   --   --   --   PHOS  --   --  5.4*  --   --   --   --   < > = values in this interval not displayed. Liver Function Tests:  Recent Labs Lab 04/07/15 0430 04/08/15 2128 04/12/15 0640  AST  --  181* 122*  ALT  --  59 63  ALKPHOS  --  116 150*  BILITOT  --  1.2 1.1  PROT  --  6.7 6.8  ALBUMIN 2.0* 2.4* 2.1*    Recent Labs Lab 04/08/15 2128  LIPASE 14*   No results for input(s): AMMONIA in the last 168 hours. CBC:  Recent Labs Lab 04/08/15 2128 04/09/15 0849 04/10/15 0622 04/11/15 0930 04/12/15 0640  WBC 10.7* 10.6* 9.4 9.1 8.9  NEUTROABS 8.6*  --   --   --   --   HGB 8.9* 8.8* 8.2* 8.5* 9.2*  HCT 28.7* 28.1* 26.7* 27.6* 30.9*  MCV 85.7 85.4 85.9 86.0 87.3  PLT 190 160 193 227 237   Cardiac Enzymes: No results for input(s): CKTOTAL, CKMB, CKMBINDEX, TROPONINI in the last 168 hours. BNP: BNP (last 3 results) No results for input(s): BNP in the last 8760 hours.  ProBNP (last 3 results) No results for input(s): PROBNP in the last 8760 hours.  CBG: No results for input(s): GLUCAP in the last 168 hours.     SignedDomenic Polite  [facility] 04/12/2015, 9:55 AM

## 2015-04-12 NOTE — Progress Notes (Signed)
S: Abd pain better O:BP 127/50 mmHg  Pulse 70  Temp(Src) 97.2 F (36.2 C) (Oral)  Resp 18  Ht 6\' 2"  (1.88 m)  Wt 81.2 kg (179 lb 0.2 oz)  BMI 22.97 kg/m2  SpO2 90%  Intake/Output Summary (Last 24 hours) at 04/12/15 0945 Last data filed at 04/12/15 0600  Gross per 24 hour  Intake    120 ml  Output   3000 ml  Net  -2880 ml   Weight change:  EXB:MWUXL and alert CVS:RRR 2/6 systolic M Resp: + crackles Abd:+ BS NTND Ext:tr edema on LLE.  LUA AVG + bruit NEURO:CNI Ox3 no asterixis   . amiodarone  200 mg Oral Daily  . aspirin EC  325 mg Oral Daily  . atorvastatin  80 mg Oral q1800  . darbepoetin (ARANESP) injection - DIALYSIS  60 mcg Intravenous Q Mon-HD  . diltiazem  30 mg Oral BID  . doxercalciferol  0.5 mcg Intravenous Q M,W,F-HD  . hydrocerin   Topical BID  . levothyroxine  125 mcg Oral QPM  . meclizine  25 mg Oral BID  . multivitamin  1 tablet Oral QPM  . pantoprazole  40 mg Oral BID  . polyethylene glycol  17 g Oral Daily  . senna-docusate  1 tablet Oral BID   US Abdomen Limited Ruq  04/11/2015   CLINICAL DATA:  Right upper quadrant pain for 1 month  EXAM: US ABDOMEN LIMITED - RIGHT UPPER QUADRANT  COMPARISON:  04/09/2015  FINDINGS: Gallbladder:  No gallstones are identified. Considerable sludge is noted within the gallbladder. No increased wall thickness is noted.  Common bile duct:  Diameter: 4.5 mm  Liver:  No focal lesion identified. Within normal limits in parenchymal echogenicity.  Incidental note is made of minimal ascites similar to that seen on recent CT examination.  IMPRESSION: Minimal ascites.  Gallbladder sludge.   Electronically Signed   By: Inez Catalina M.D.   On: 04/11/2015 15:30   BMET    Component Value Date/Time   NA 138 04/12/2015 0640   K 3.9 04/12/2015 0640   CL 98* 04/12/2015 0640   CO2 26 04/12/2015 0640   GLUCOSE 77 04/12/2015 0640   BUN 18 04/12/2015 0640   CREATININE 4.72* 04/12/2015 0640   CALCIUM 8.1* 04/12/2015 0640   GFRNONAA 11*  04/12/2015 0640   GFRAA 12* 04/12/2015 0640   CBC    Component Value Date/Time   WBC 8.9 04/12/2015 0640   RBC 3.54* 04/12/2015 0640   HGB 9.2* 04/12/2015 0640   HCT 30.9* 04/12/2015 0640   PLT 237 04/12/2015 0640   MCV 87.3 04/12/2015 0640   MCH 26.0 04/12/2015 0640   MCHC 29.8* 04/12/2015 0640   RDW 19.9* 04/12/2015 0640   LYMPHSABS 1.1 04/08/2015 2128   MONOABS 0.9 04/08/2015 2128   EOSABS 0.1 04/08/2015 2128   BASOSABS 0.0 04/08/2015 2128     Assessment:  1. Abd Pain.  [place] neg for gallstones 2. AAA SP stent graft 3. Anemia on aranesp 4. A fib/flutter 5. ESRD  Plan: 1.  Note plan for DC today.  He says he only dialyses M & F but his UO has decreased since the stent graft was placed and he may need TIW HD.  He will discuss this with Dr Hinda Lenis.   Alfie Rideaux T

## 2015-04-14 ENCOUNTER — Emergency Department (HOSPITAL_COMMUNITY): Payer: Medicare Other

## 2015-04-14 ENCOUNTER — Encounter (HOSPITAL_COMMUNITY): Payer: Self-pay | Admitting: Cardiology

## 2015-04-14 ENCOUNTER — Other Ambulatory Visit (HOSPITAL_COMMUNITY): Payer: Self-pay

## 2015-04-14 ENCOUNTER — Inpatient Hospital Stay (HOSPITAL_COMMUNITY)
Admission: EM | Admit: 2015-04-14 | Discharge: 2015-05-06 | DRG: 308 | Disposition: E | Payer: Medicare Other | Attending: Pediatrics | Admitting: Pediatrics

## 2015-04-14 ENCOUNTER — Inpatient Hospital Stay (HOSPITAL_COMMUNITY): Payer: Medicare Other

## 2015-04-14 DIAGNOSIS — R109 Unspecified abdominal pain: Secondary | ICD-10-CM | POA: Diagnosis present

## 2015-04-14 DIAGNOSIS — D649 Anemia, unspecified: Secondary | ICD-10-CM | POA: Diagnosis present

## 2015-04-14 DIAGNOSIS — R1084 Generalized abdominal pain: Secondary | ICD-10-CM

## 2015-04-14 DIAGNOSIS — Z8546 Personal history of malignant neoplasm of prostate: Secondary | ICD-10-CM | POA: Diagnosis not present

## 2015-04-14 DIAGNOSIS — R52 Pain, unspecified: Secondary | ICD-10-CM

## 2015-04-14 DIAGNOSIS — I959 Hypotension, unspecified: Secondary | ICD-10-CM | POA: Diagnosis present

## 2015-04-14 DIAGNOSIS — E785 Hyperlipidemia, unspecified: Secondary | ICD-10-CM | POA: Diagnosis present

## 2015-04-14 DIAGNOSIS — E039 Hypothyroidism, unspecified: Secondary | ICD-10-CM | POA: Diagnosis present

## 2015-04-14 DIAGNOSIS — H9192 Unspecified hearing loss, left ear: Secondary | ICD-10-CM | POA: Diagnosis present

## 2015-04-14 DIAGNOSIS — M069 Rheumatoid arthritis, unspecified: Secondary | ICD-10-CM | POA: Diagnosis present

## 2015-04-14 DIAGNOSIS — I4892 Unspecified atrial flutter: Secondary | ICD-10-CM | POA: Diagnosis present

## 2015-04-14 DIAGNOSIS — I252 Old myocardial infarction: Secondary | ICD-10-CM | POA: Diagnosis not present

## 2015-04-14 DIAGNOSIS — Z87891 Personal history of nicotine dependence: Secondary | ICD-10-CM

## 2015-04-14 DIAGNOSIS — I739 Peripheral vascular disease, unspecified: Secondary | ICD-10-CM | POA: Diagnosis present

## 2015-04-14 DIAGNOSIS — I4891 Unspecified atrial fibrillation: Secondary | ICD-10-CM | POA: Diagnosis present

## 2015-04-14 DIAGNOSIS — I5023 Acute on chronic systolic (congestive) heart failure: Secondary | ICD-10-CM | POA: Diagnosis present

## 2015-04-14 DIAGNOSIS — Z66 Do not resuscitate: Secondary | ICD-10-CM | POA: Diagnosis present

## 2015-04-14 DIAGNOSIS — I2510 Atherosclerotic heart disease of native coronary artery without angina pectoris: Secondary | ICD-10-CM | POA: Diagnosis present

## 2015-04-14 DIAGNOSIS — R570 Cardiogenic shock: Secondary | ICD-10-CM | POA: Diagnosis present

## 2015-04-14 DIAGNOSIS — Z951 Presence of aortocoronary bypass graft: Secondary | ICD-10-CM

## 2015-04-14 DIAGNOSIS — R531 Weakness: Secondary | ICD-10-CM

## 2015-04-14 DIAGNOSIS — E875 Hyperkalemia: Secondary | ICD-10-CM | POA: Diagnosis present

## 2015-04-14 DIAGNOSIS — I509 Heart failure, unspecified: Secondary | ICD-10-CM

## 2015-04-14 DIAGNOSIS — N186 End stage renal disease: Secondary | ICD-10-CM | POA: Diagnosis present

## 2015-04-14 DIAGNOSIS — R001 Bradycardia, unspecified: Secondary | ICD-10-CM | POA: Diagnosis present

## 2015-04-14 DIAGNOSIS — Z9115 Patient's noncompliance with renal dialysis: Secondary | ICD-10-CM

## 2015-04-14 DIAGNOSIS — I872 Venous insufficiency (chronic) (peripheral): Secondary | ICD-10-CM | POA: Diagnosis present

## 2015-04-14 DIAGNOSIS — I120 Hypertensive chronic kidney disease with stage 5 chronic kidney disease or end stage renal disease: Secondary | ICD-10-CM | POA: Diagnosis present

## 2015-04-14 DIAGNOSIS — Z7982 Long term (current) use of aspirin: Secondary | ICD-10-CM

## 2015-04-14 DIAGNOSIS — E8770 Fluid overload, unspecified: Secondary | ICD-10-CM

## 2015-04-14 DIAGNOSIS — E872 Acidosis: Secondary | ICD-10-CM | POA: Diagnosis present

## 2015-04-14 DIAGNOSIS — Z95828 Presence of other vascular implants and grafts: Secondary | ICD-10-CM

## 2015-04-14 DIAGNOSIS — N2581 Secondary hyperparathyroidism of renal origin: Secondary | ICD-10-CM | POA: Diagnosis present

## 2015-04-14 LAB — CBC WITH DIFFERENTIAL/PLATELET
BASOS ABS: 0 10*3/uL (ref 0.0–0.1)
BASOS PCT: 0 % (ref 0–1)
EOS PCT: 0 % (ref 0–5)
Eosinophils Absolute: 0 10*3/uL (ref 0.0–0.7)
HEMATOCRIT: 33.1 % — AB (ref 39.0–52.0)
HEMOGLOBIN: 10.3 g/dL — AB (ref 13.0–17.0)
LYMPHS ABS: 1.2 10*3/uL (ref 0.7–4.0)
Lymphocytes Relative: 12 % (ref 12–46)
MCH: 26.7 pg (ref 26.0–34.0)
MCHC: 31.1 g/dL (ref 30.0–36.0)
MCV: 85.8 fL (ref 78.0–100.0)
MONO ABS: 0.7 10*3/uL (ref 0.1–1.0)
Monocytes Relative: 7 % (ref 3–12)
NEUTROS ABS: 8.4 10*3/uL — AB (ref 1.7–7.7)
Neutrophils Relative %: 81 % — ABNORMAL HIGH (ref 43–77)
Platelets: 258 10*3/uL (ref 150–400)
RBC: 3.86 MIL/uL — ABNORMAL LOW (ref 4.22–5.81)
RDW: 20.8 % — AB (ref 11.5–15.5)
WBC: 10.3 10*3/uL (ref 4.0–10.5)

## 2015-04-14 LAB — I-STAT TROPONIN, ED: Troponin i, poc: 0.08 ng/mL (ref 0.00–0.08)

## 2015-04-14 LAB — CBC
HCT: 30.2 % — ABNORMAL LOW (ref 39.0–52.0)
Hemoglobin: 9.3 g/dL — ABNORMAL LOW (ref 13.0–17.0)
MCH: 26.5 pg (ref 26.0–34.0)
MCHC: 30.8 g/dL (ref 30.0–36.0)
MCV: 86 fL (ref 78.0–100.0)
Platelets: 212 10*3/uL (ref 150–400)
RBC: 3.51 MIL/uL — AB (ref 4.22–5.81)
RDW: 20.9 % — AB (ref 11.5–15.5)
WBC: 9.9 10*3/uL (ref 4.0–10.5)

## 2015-04-14 LAB — I-STAT CHEM 8, ED
BUN: 44 mg/dL — AB (ref 6–20)
Calcium, Ion: 0.93 mmol/L — ABNORMAL LOW (ref 1.13–1.30)
Chloride: 100 mmol/L — ABNORMAL LOW (ref 101–111)
Creatinine, Ser: 8.1 mg/dL — ABNORMAL HIGH (ref 0.61–1.24)
GLUCOSE: 142 mg/dL — AB (ref 65–99)
HCT: 38 % — ABNORMAL LOW (ref 39.0–52.0)
Hemoglobin: 12.9 g/dL — ABNORMAL LOW (ref 13.0–17.0)
Potassium: 5.5 mmol/L — ABNORMAL HIGH (ref 3.5–5.1)
Sodium: 134 mmol/L — ABNORMAL LOW (ref 135–145)
TCO2: 19 mmol/L (ref 0–100)

## 2015-04-14 LAB — COMPREHENSIVE METABOLIC PANEL
ALBUMIN: 2.5 g/dL — AB (ref 3.5–5.0)
ALT: 69 U/L — ABNORMAL HIGH (ref 17–63)
ANION GAP: 21 — AB (ref 5–15)
AST: 140 U/L — AB (ref 15–41)
Alkaline Phosphatase: 194 U/L — ABNORMAL HIGH (ref 38–126)
BUN: 44 mg/dL — AB (ref 6–20)
CO2: 19 mmol/L — ABNORMAL LOW (ref 22–32)
CREATININE: 8.42 mg/dL — AB (ref 0.61–1.24)
Calcium: 8.2 mg/dL — ABNORMAL LOW (ref 8.9–10.3)
Chloride: 95 mmol/L — ABNORMAL LOW (ref 101–111)
GFR calc non Af Amer: 5 mL/min — ABNORMAL LOW (ref >60)
GFR, EST AFRICAN AMERICAN: 6 mL/min — AB (ref >60)
GLUCOSE: 146 mg/dL — AB (ref 65–99)
Potassium: 5.7 mmol/L — ABNORMAL HIGH (ref 3.5–5.1)
SODIUM: 135 mmol/L (ref 135–145)
TOTAL PROTEIN: 8.1 g/dL (ref 6.5–8.1)
Total Bilirubin: 0.9 mg/dL (ref 0.3–1.2)

## 2015-04-14 LAB — PROTIME-INR
INR: 1.26 (ref 0.00–1.49)
PROTHROMBIN TIME: 15.9 s — AB (ref 11.6–15.2)

## 2015-04-14 LAB — RENAL FUNCTION PANEL
Albumin: 2.2 g/dL — ABNORMAL LOW (ref 3.5–5.0)
Anion gap: 23 — ABNORMAL HIGH (ref 5–15)
BUN: 45 mg/dL — ABNORMAL HIGH (ref 6–20)
CHLORIDE: 96 mmol/L — AB (ref 101–111)
CO2: 16 mmol/L — AB (ref 22–32)
Calcium: 7.9 mg/dL — ABNORMAL LOW (ref 8.9–10.3)
Creatinine, Ser: 8.56 mg/dL — ABNORMAL HIGH (ref 0.61–1.24)
GFR calc non Af Amer: 5 mL/min — ABNORMAL LOW (ref >60)
GFR, EST AFRICAN AMERICAN: 6 mL/min — AB (ref >60)
Glucose, Bld: 90 mg/dL (ref 65–99)
PHOSPHORUS: 7.9 mg/dL — AB (ref 2.5–4.6)
Potassium: 5.8 mmol/L — ABNORMAL HIGH (ref 3.5–5.1)
SODIUM: 135 mmol/L (ref 135–145)

## 2015-04-14 LAB — BRAIN NATRIURETIC PEPTIDE: B Natriuretic Peptide: >4500 pg/mL — ABNORMAL HIGH (ref 0.0–100.0)

## 2015-04-14 LAB — LIPASE, BLOOD: Lipase: 43 U/L (ref 22–51)

## 2015-04-14 MED ORDER — SODIUM CHLORIDE 0.9 % IV SOLN: 100.0000 mL | INTRAVENOUS | Status: DC | PRN

## 2015-04-14 MED ORDER — SODIUM CHLORIDE 0.9 % IV SOLN
100.0000 mL | INTRAVENOUS | Status: DC | PRN
  Administered 2015-04-14: 100 mL via INTRAVENOUS

## 2015-04-14 MED ORDER — SODIUM CHLORIDE 0.9 % IJ SOLN
3.0000 mL | Freq: Two times a day (BID) | INTRAMUSCULAR | Status: DC
  Administered 2015-04-14: 3 mL via INTRAVENOUS

## 2015-04-14 MED ORDER — SODIUM CHLORIDE 0.9 % IV BOLUS (SEPSIS)
500.0000 mL | Freq: Once | INTRAVENOUS | Status: AC
  Administered 2015-04-14: 500 mL via INTRAVENOUS

## 2015-04-14 MED ORDER — ONDANSETRON 4 MG PO TBDP
8.0000 mg | ORAL_TABLET | Freq: Once | ORAL | Status: DC
  Filled 2015-04-14: qty 2

## 2015-04-14 MED ORDER — PROMETHAZINE HCL 25 MG/ML IJ SOLN
12.5000 mg | Freq: Four times a day (QID) | INTRAMUSCULAR | Status: DC | PRN
  Filled 2015-04-14: qty 1

## 2015-04-14 MED ORDER — LEVOTHYROXINE SODIUM 125 MCG PO TABS
125.0000 ug | ORAL_TABLET | Freq: Every day | ORAL | Status: DC
  Filled 2015-04-14 (×2): qty 1

## 2015-04-14 MED ORDER — IOHEXOL 350 MG/ML SOLN
100.0000 mL | Freq: Once | INTRAVENOUS | Status: AC | PRN
  Administered 2015-04-14: 100 mL via INTRAVENOUS

## 2015-04-14 MED ORDER — ASPIRIN EC 325 MG PO TBEC
325.0000 mg | DELAYED_RELEASE_TABLET | Freq: Every day | ORAL | Status: DC
  Filled 2015-04-14: qty 1

## 2015-04-14 MED ORDER — NEPRO/CARBSTEADY PO LIQD: 237.0000 mL | ORAL | Status: DC | PRN

## 2015-04-14 MED ORDER — ACETAMINOPHEN 650 MG RE SUPP: 650.0000 mg | Freq: Four times a day (QID) | RECTAL | Status: DC | PRN

## 2015-04-14 MED ORDER — ACETAMINOPHEN 325 MG PO TABS
650.0000 mg | ORAL_TABLET | Freq: Four times a day (QID) | ORAL | Status: DC | PRN
  Administered 2015-04-15: 650 mg via ORAL
  Filled 2015-04-14: qty 2

## 2015-04-14 MED ORDER — LIDOCAINE-PRILOCAINE 2.5-2.5 % EX CREA: 1.0000 "application " | TOPICAL_CREAM | CUTANEOUS | Status: DC | PRN

## 2015-04-14 MED ORDER — LIDOCAINE HCL (PF) 1 % IJ SOLN: 5.0000 mL | INTRAMUSCULAR | Status: DC | PRN

## 2015-04-14 MED ORDER — MORPHINE SULFATE 2 MG/ML IJ SOLN: 2.0000 mg | INTRAMUSCULAR | Status: DC | PRN

## 2015-04-14 MED ORDER — POLYETHYLENE GLYCOL 3350 17 G PO PACK
17.0000 g | PACK | Freq: Every day | ORAL | Status: DC
  Filled 2015-04-14: qty 1

## 2015-04-14 MED ORDER — ONDANSETRON HCL 4 MG/2ML IJ SOLN
4.0000 mg | Freq: Once | INTRAMUSCULAR | Status: AC
  Administered 2015-04-14: 4 mg via INTRAVENOUS
  Filled 2015-04-14: qty 2

## 2015-04-14 MED ORDER — PANTOPRAZOLE SODIUM 40 MG IV SOLR
40.0000 mg | Freq: Two times a day (BID) | INTRAVENOUS | Status: DC
Start: 2015-04-14 — End: 2015-04-15
  Administered 2015-04-14: 40 mg via INTRAVENOUS
  Filled 2015-04-14 (×3): qty 40

## 2015-04-14 MED ORDER — HYDROXYZINE HCL 50 MG/ML IM SOLN
25.0000 mg | Freq: Once | INTRAMUSCULAR | Status: DC
Start: 2015-04-14 — End: 2015-04-15
  Filled 2015-04-14: qty 0.5

## 2015-04-14 MED ORDER — PENTAFLUOROPROP-TETRAFLUOROETH EX AERO: 1.0000 "application " | INHALATION_SPRAY | CUTANEOUS | Status: DC | PRN

## 2015-04-14 MED ORDER — ONDANSETRON HCL 4 MG/2ML IJ SOLN
4.0000 mg | Freq: Once | INTRAMUSCULAR | Status: AC
  Administered 2015-04-14: 4 mg via INTRAVENOUS

## 2015-04-14 NOTE — ED Notes (Signed)
Wife Gay Filler [employer]) - [phone] Son Nollie Terlizzi)- [phone]

## 2015-04-14 NOTE — ED Notes (Addendum)
Audie Pinto MD made aware of pt condition and will come see pt.  Pt placed on Zoll pads.

## 2015-04-14 NOTE — ED Notes (Signed)
Pt reports that he was just here and dc'ed from upstairs. Reports he had an AAA that they are watching. Pt reports abd pain and weakness.

## 2015-04-14 NOTE — Consult Note (Addendum)
CARDIOLOGY CONSULT NOTE   Patient ID: [name] MRN: [id number] DOB/AGE: 10-Feb-1934 81 y.o.  Admit date: 04/29/2015  Primary Physician   [name] Chroman., MD Primary Cardiologist   Dr Harl Bowie in [place] Reason for Consultation   [name]  [name] is a 81 y.o. year old male with a history of NSTEMI, aflutter, cardiogenic shock, 3 vessel CAD, CABG x 4, ESRD with initiation of dialysis, AAA with [facility] on 04/06/2015. D/c 06/02, admitted 06/04 with abdominal pain and nausea, weakness. Discharged again on 06/07.   Pt states has been struggling with nausea and abdominal pain, especially in his lower abdomen since discharge. The pain has been constant, as has the nausea. He has had no chest pain, no palpitations, but has been weak. He has increased DOE. He walks with a walker, but has been weak and gets light-headed with exertion.  He has no awareness of his slow heart rate.  Past Medical History  Diagnosis Date  . Hypertension   . 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 Coronary artery disease     a. 05/2013 NSTEMI/Cath: LM 90d, LAD 90ost/7m, LCX 90ost, OM1 90p, RCA 100p;  b. 05/2013 CABG x 4: LIMA->LAD, VG->RI->OM, VG->PDA.  [name] Atrial flutter     a. Dx 05/2013-->dilt/coumadin.  [name] DVT (deep venous thrombosis) 02/2011    LLL,  as of 2016 pt. reports the DVT is resolved  . GERD (gastroesophageal reflux disease)   . Compression fracture of cervical spine   . Stasis dermatitis     LLL, wears TED hose  . Chronic systolic CHF (congestive heart failure)     EF 45-55% per echo 05/28/13  . Hyperlipidemia   . Dysphagia     a. after prolonged intubation 05/2013; b. 06/2013 refused EDG/Dilatation.  . Myocardial infarction   . Hypothyroidism   . Stroke     denies  . Constipation   . Dysrhythmia   . Varicose veins   . Peripheral arterial disease   . AAA (abdominal aortic aneurysm)   . 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 Depression     pt. reports that he doesn't take  any med. for "sadness", but he admits to depression    . ESRD on dialysis 06/2013    Mon and Fri in [place]  . Rheumatoid arthritis     stopped methotrexate 2 yrs. ago  . Prostate cancer     pt. reports that he was on a table for 30 days, states "this things went around my body" for 3 days. Pt. reports that he doesn't believe he even had prostate ca   . Renal insufficiency      Past Surgical History  Procedure Laterality Date  . Insertion of dialysis catheter N/A 05/26/2013    Procedure: INSERTION OF DIALYSIS CATHETER;  Surgeon: Elam Dutch, MD;  Location: [facility];  Service: Vascular;  Laterality: N/A;  right IJ  . Cardiac surgery    . Cataract extraction    . Av fistula placement Left 07/08/2013    Procedure: INSERTION OF ARTERIOVENOUS (AV) GORE-TEX GRAFT ARM;  Surgeon: Elam Dutch, MD;  Location: [place] [facility];  Service: Vascular;  Laterality: Left;  . Coronary artery bypass graft N/A 05/13/2013    Procedure: CORONARY ARTERY BYPASS GRAFTING (CABG);  Surgeon: Gaye Pollack, MD;  Location: [place];  Service: Open Heart Surgery;  Laterality: N/A;  . Av fistula placement Left 02/03/2014  Procedure: INSERTION OF ARTERIOVENOUS (AV) GORE-TEX GRAFT USING 4-7 MM X 45 CM STRETCH GORETEX GRAFT;  Surgeon: Elam Dutch, MD;  Location: [place];  Service: Vascular;  Laterality: Left;  . Left heart catheterization with coronary angiogram N/A 05/13/2013    Procedure: LEFT HEART CATHETERIZATION WITH CORONARY ANGIOGRAM;  Surgeon: Burnell Blanks, MD;  Location: [facility] CATH LAB;  Service: Cardiovascular;  Laterality: N/A;  . Eye surgery Bilateral     cataract removed, ? IOL  . Abdominal aortic endovascular stent graft N/A 04/06/2015    Procedure: ABDOMINAL AORTIC ENDOVASCULAR STENT GRAFT;  Surgeon: Elam Dutch, MD;  Location: [facility] OR;  Service: Vascular;  Laterality: N/A;  . Endarterectomy femoral Right 04/06/2015    Procedure: Right ENDARTERECTOMY FEMORAL;  Surgeon: Elam Dutch, MD;  Location: [place];   Service: Vascular;  Laterality: Right;  . Patch angioplasty Right 04/06/2015    Procedure: Right Femoral PATCH ANGIOPLASTY;  Surgeon: Elam Dutch, MD;  Location: [place];  Service: Vascular;  Laterality: Right;    Allergies  Allergen Reactions  . 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  amiodarone (PACERONE) 200 MG tablet Take 1 tablet (200 mg total) by mouth daily.  aspirin EC 325 MG tablet Take 1 tablet (325 mg total) by mouth daily.  atorvastatin (LIPITOR) 80 MG tablet Take 1 tablet (80 mg total) by mouth daily. Patient taking differently: Take 80 mg by mouth daily at 6 PM.   B Complex-C-Folic Acid (DIALYVITE 277) 0.8 MG TABS Take 1 tablet by mouth every evening.   diltiazem (CARDIZEM) 30 MG tablet Take 1 tablet (30 mg total) by mouth 2 (two) times daily.  HYDROcodone-acetaminophen (NORCO/VICODIN) 5-325 MG per tablet Take 1 tablet by mouth every 6 (six) hours as needed for severe pain.  hydrOXYzine (ATARAX/VISTARIL) 25 MG tablet Take 1 tablet (25 mg total) by mouth 3 (three) times daily as needed for itching. Patient taking differently: Take 25 mg by mouth 2 (two) times daily.   levothyroxine (SYNTHROID, LEVOTHROID) 125 MCG tablet Take 125 mcg by mouth every evening.   meclizine (ANTIVERT) 25 MG tablet Take 25 mg by mouth 2 (two) times daily.   ondansetron (ZOFRAN) 4 MG tablet Take 4 mg by mouth every 8 (eight) hours as needed for nausea.   pantoprazole (PROTONIX) 40 MG tablet Take 40 mg by mouth 2 (two) times daily.  polyethylene glycol (MIRALAX / GLYCOLAX) packet Take 17 g by mouth daily.  torsemide (DEMADEX) 20 MG tablet Take 2 tablets (40 mg total) by mouth 2 (two) times daily.  traMADol (ULTRAM) 50 MG tablet Take 1 tablet (50 mg total) by mouth every 8 (eight) hours as  needed.     History   Social History  . Marital Status: Married    Spouse Name: N/A  . Number of Children: N/A  . Years of Education: N/A   Occupational History  . Not on file.   Social History Main Topics  . Smoking status: Former Smoker -- 1.00 packs/day for 47 years    Types: Cigarettes    Start date: 11/30/1960    Quit date: 11/05/2000  . Smokeless tobacco: Never Used  . Alcohol Use: No     Comment: Previously drank 6 beers/day, quit in 1994  . Drug  Use: No  . Sexual Activity: Not on file   Other Topics Concern  . Not on file   Social History Narrative   Lives in [place] with wife.    Family Status  Relation Status Death Age  . Mother Deceased   . Father Deceased    Family History  Problem Relation Age of Onset  . Heart attack Mother     deceased @ 75  . Kidney failure Father     deceased @ 65  . Heart attack Son     deceased @ 76     ROS:  Full 14 point review of systems complete and found to be negative unless listed above.  Physical Exam: Blood pressure 108/49, pulse 49, temperature 98.5 F (36.9 C), resp. rate 24, weight 179 lb (81.194 kg), SpO2 97 %.  General: Well developed, well nourished, male in no acute distress Head: Eyes PERRLA, No xanthomas.   Normocephalic and atraumatic, oropharynx without edema or exudate. Dentition:  Lungs: bibasilar rales Heart: HRRR S1 S2, no rub/gallop, 3/6 murmur. pulses are 1 + both lower extrem. HD access in LUE. Palpable pulse in RUE Neck: No carotid bruits. No lymphadenopathy.  JVD 9-10 cm Abdomen: Bowel sounds present, abdomen soft and non-tender without masses or hernias noted. Msk:  No spine or cva tenderness. Generalized weakness, no joint deformities or effusions. Extremities: No clubbing or cyanosis. trace edema.  Neuro: Alert and oriented X 2. No focal deficits noted. Psych:  Good affect, responds appropriately Skin: No rashes or lesions noted.  Labs:   Lab Results  Component Value Date   WBC 10.3  04/30/2015   HGB 12.9* 04/29/2015   HCT 38.0* 04/21/2015   MCV 85.8 05/01/2015   PLT 258 04/21/2015    Recent Labs  04/06/2015 1917  INR 1.26     Recent Labs Lab 04/22/2015 1917 04/19/2015 1918  NA 135 134*  K 5.7* 5.5*  CL 95* 100*  CO2 19*  --   BUN 44* 44*  CREATININE 8.42* 8.10*  CALCIUM 8.2*  --   PROT 8.1  --   BILITOT 0.9  --   ALKPHOS 194*  --   ALT 69*  --   AST 140*  --   GLUCOSE 146* 142*  ALBUMIN 2.5*  --    MAGNESIUM  Date Value Ref Range Status  04/06/2015 1.8 1.7 - 2.4 mg/dL Final    Recent Labs  04/06/2015 1919  TROPIPOC 0.08   LIPASE  Date/Time Value Ref Range Status  04/08/2015 09:28 PM 14* 22 - 51 U/L Final   TSH  Date/Time Value Ref Range Status  06/24/2013 04:45 PM 6.572* 0.350 - 4.500 uIU/mL Final    Comment:    Performed at [facility]   Echo: 05/28/2013 Study Conclusions - Left ventricle: Septal and mid and basal inferior wall hypokinesis The cavity size was mildly dilated. Wall thickness was normal. Systolic function was mildly reduced. The estimated ejection fraction was in the range of 45% to 50%. - Left atrium: The atrium was mildly dilated. - Impressions: Overall image quality for study is poor Impressions: - Overall image quality for study is poor  ECG:  Junctional rhythm, HR 40s-50s  Radiology:  Dg Chest Port 1 View  04/12/2015   CLINICAL DATA:  Abdominal pain. Weakness. Short of breath. Initial encounter.  EXAM: PORTABLE CHEST - 1 VIEW  COMPARISON:  04/09/2015.  FINDINGS: Cardiomegaly. Defibrillator pads overlie the chest. Monitoring leads project over the chest. Chronic pulmonary parenchymal scarring  and opacity at the lung bases is similar to 04/09/2015 chest radiograph. No new airspace consolidation is identified allowing for overlying support apparatus. Median sternotomy/ CABG.  IMPRESSION: Cardiomegaly and chronic changes of the chest. Chronic pulmonary vascular congestion. No interval change or acute  abnormality.   Electronically Signed   By: Dereck Ligas M.D.   On: 04/21/2015 20:13   Dg Abd Portable 1v  04/21/2015   CLINICAL DATA:  Weakness. Abdominal pain. Symptoms for 4 days. Initial encounter.  EXAM: PORTABLE ABDOMEN - 1 VIEW  COMPARISON:  CT 04/09/2015.  FINDINGS: Aortoiliac stent graft. Prostate brachytherapy seeds are present. The bowel gas pattern is normal. No dilated loops of large or small bowel. Gas is present within the rectum. Atherosclerosis.  IMPRESSION: No acute abnormality.  Atherosclerosis and aortoiliac stent graft.   Electronically Signed   By: Dereck Ligas M.D.   On: 04/08/2015 20:14    ASSESSMENT AND PLAN:   The patient was seen today by Dr Meda Coffee, the patient evaluated and the data reviewed.  Principal Problem:   Abdominal pain, nausea - per IM  Active Problems:   Junctional [name] - on amiodarone 200 mg daily and Cardizem 30 mg bid. - d/c both - LFTs are abnormal, may decrease clearance of meds - Junctional rhythm seen on ECG 06/04. Previous ECGs were SR, but wide, nonspecific conduction delay was present - follow HR/rhythm once electrolytes improved and off Diltiazem/amiodarone. - If still bradycardic, EP may need to see - watch on telemetry, has had atrial fib/flutter in the past - MD advise on checking echo  Otherwise, per IM and consultants   ESRD on dialysis   History of repair of aneurysm of abdominal aorta using endovascular stent graft   Hyperkalemia   Signed: Lenoard Aden 04/17/2015 8:22 PM Beeper [phone]  Co-Sign MD  The patient was seen, examined and discussed with Rosaria Ferries, PA-C and I agree with the above.   Ill appearing 81 year old male with h/o CAD, s/p CABG, s/p AAA repair with [facility] on 04/06/15, ESRD on HD. 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 HE had junctional [name] at the time. He was discharged home on 04/09/15.  He is coming today for similar symptoms - weakness, abdominal pain, distention, he states that he never felt good after the surgery and he just got tired of it. His ECG shows junctional rhythm with non-specific intraventricular delay (unchanged from 04/09/15). His K is 5.5. On physical exam he appears ill-looking, + JVD 6 cm, mild crackles at lung bases, distended and tender abdomen, no LE edema. We recommend to send to HD tonight. For now his BP is 110/63, no need for pacemaker other than bedside pads. Hold diltiazem. We will follow. He is awaiting abdominal CTA to evaluate for endoleak progression.  Dorothy Spark 05/02/2015

## 2015-04-14 NOTE — ED Notes (Signed)
Dr.Patel has been paged for [facility] [phone]

## 2015-04-14 NOTE — Consult Note (Signed)
[facility] Renal Consultation Note    Indication for Consultation:  Management of ESRD/hemodialysis; anemia, hypertension/volume and secondary hyperparathyroidism  HPI: [name] is a 81 y.o. male.  Pt is a 81yo M with PMH sig for CAD s/p CABG, A flutter, HTN, AAA treated with an endovascular stent graft and right femoral endarterectomy on 04/06/15, and ESRD at [facility], MWF, (with ongoing noncompliance with HD routinely skipping one treatment/week) who presents back to [facility] with abdominal pain.  Pt was seen in consultation on 04/06/15 for similar presentation, please see initial consult for further details.  He was found to have volume overload and hyperkalemia, we have been asked to help manage his volume and HD requirements while he remains an inpatient.  Past Medical History  Diagnosis Date  . Hypertension   . 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 Coronary artery disease     a. 05/2013 NSTEMI/Cath: LM 90d, LAD 90ost/71m, LCX 90ost, OM1 90p, RCA 100p;  b. 05/2013 CABG x 4: LIMA->LAD, VG->RI->OM, VG->PDA.  [name] Atrial flutter     a. Dx 05/2013-->dilt/coumadin.  [name] DVT (deep venous thrombosis) 02/2011    LLL,  as of 2016 pt. reports the DVT is resolved  . GERD (gastroesophageal reflux disease)   . Compression fracture of cervical spine   . Stasis dermatitis     LLL, wears TED hose  . Chronic systolic CHF (congestive heart failure)     EF 45-55% per echo 05/28/13  . Hyperlipidemia   . Dysphagia     a. after prolonged intubation 05/2013; b. 06/2013 refused EDG/Dilatation.  . Myocardial infarction   . Hypothyroidism   . Stroke     denies  . Constipation   . Dysrhythmia   . Varicose veins   . Peripheral arterial disease   . AAA (abdominal aortic aneurysm)   . 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 Depression     pt. reports that he doesn't take any med. for "sadness", but he admits to depression    . ESRD on dialysis 06/2013    Mon and Fri in [place]  . Rheumatoid  arthritis     stopped methotrexate 2 yrs. ago  . Prostate cancer     pt. reports that he was on a table for 30 days, states "this things went around my body" for 3 days. Pt. reports that he doesn't believe he even had prostate ca   . Renal insufficiency    Past Surgical History  Procedure Laterality Date  . Insertion of dialysis catheter N/A 05/26/2013    Procedure: INSERTION OF DIALYSIS CATHETER;  Surgeon: Elam Dutch, MD;  Location: [place];  Service: Vascular;  Laterality: N/A;  right IJ  . Cardiac surgery    . Cataract extraction    . Av fistula placement Left 07/08/2013    Procedure: INSERTION OF ARTERIOVENOUS (AV) GORE-TEX GRAFT ARM;  Surgeon: Elam Dutch, MD;  Location: [place];  Service: Vascular;  Laterality: Left;  . Coronary artery bypass graft N/A 05/13/2013    Procedure: CORONARY ARTERY BYPASS GRAFTING (CABG);  Surgeon: Gaye Pollack, MD;  Location: [place];  Service: Open Heart Surgery;  Laterality: N/A;  . Av fistula placement Left 02/03/2014    Procedure: INSERTION OF ARTERIOVENOUS (AV) GORE-TEX GRAFT USING 4-7 MM X 45 CM STRETCH GORETEX GRAFT;  Surgeon: Elam Dutch, MD;  Location: [place];  Service: Vascular;  Laterality: Left;  . Left  heart catheterization with coronary angiogram N/A 05/13/2013    Procedure: LEFT HEART CATHETERIZATION WITH CORONARY ANGIOGRAM;  Surgeon: Burnell Blanks, MD;  Location: [facility] C[facility] LAB;  Service: Cardiovascular;  Laterality: N/A;  . Eye surgery Bilateral     cataract removed, ? IOL  . Abdominal aortic endovascular stent graft N/A 04/06/2015    Procedure: ABDOMINAL AORTIC ENDOVASCULAR STENT GRAFT;  Surgeon: Elam Dutch, MD;  Location: [facility] OR;  Service: Vascular;  Laterality: N/A;  . Endarterectomy femoral Right 04/06/2015    Procedure: Right ENDARTERECTOMY FEMORAL;  Surgeon: Elam Dutch, MD;  Location: [facility] OR;  Service: Vascular;  Laterality: Right;  . Patch angioplasty Right 04/06/2015    Procedure: Right Femoral PATCH ANGIOPLASTY;   Surgeon: Elam Dutch, MD;  Location: [facility] OR;  Service: Vascular;  Laterality: Right;   Family History:   Family History  Problem Relation Age of Onset  . Heart attack Mother     deceased @ 40  . Kidney failure Father     deceased @ 71  . Heart attack Son     deceased @ 73   Social History:  reports that he quit smoking about 14 years ago. His smoking use included Cigarettes. He started smoking about 54 years ago. He has a 47 pack-year smoking history. He has never used smokeless tobacco. He reports that he does not drink alcohol or use illicit drugs. Allergies  Allergen Reactions  . Heparin Other (See Comments)    HIT ab+ but SRA negative   Prior to Admission medications   Medication Sig Start Date End Date Taking? Authorizing Provider  acetaminophen (TYLENOL) 325 MG tablet Take 1-2 tablets (325-650 mg total) by mouth every 6 (six) hours as needed for pain. 06/16/13   Modena Jansky, MD  amiodarone (PACERONE) 200 MG tablet Take 1 tablet (200 mg total) by mouth daily. 02/01/15   Arnoldo Lenis, MD  aspirin EC 325 MG tablet Take 1 tablet (325 mg total) by mouth daily. 04/27/14   Arnoldo Lenis, MD  atorvastatin (LIPITOR) 80 MG tablet Take 1 tablet (80 mg total) by mouth daily. Patient taking differently: Take 80 mg by mouth daily at 6 PM.  10/20/14   Arnoldo Lenis, MD  B Complex-C-Folic Acid (DIALYVITE 299) 0.8 MG TABS Take 1 tablet by mouth every evening.  11/23/14   Historical Provider, MD  diltiazem (CARDIZEM) 30 MG tablet Take 1 tablet (30 mg total) by mouth 2 (two) times daily. 12/31/14   Arnoldo Lenis, MD  HYDROcodone-acetaminophen (NORCO/VICODIN) 5-325 MG per tablet Take 1 tablet by mouth every 6 (six) hours as needed for severe pain. 04/12/15   Domenic Polite, MD  hydrOXYzine (ATARAX/VISTARIL) 25 MG tablet Take 1 tablet (25 mg total) by mouth 3 (three) times daily as needed for itching. Patient taking differently: Take 25 mg by mouth 2 (two) times daily.  07/03/13    Ricke Hey, MD  levothyroxine (SYNTHROID, LEVOTHROID) 100 MCG tablet Take 100 mcg by mouth daily. 01/15/15   Historical Provider, MD  levothyroxine (SYNTHROID, LEVOTHROID) 125 MCG tablet Take 125 mcg by mouth every evening.  03/16/15   Historical Provider, MD  meclizine (ANTIVERT) 25 MG tablet Take 25 mg by mouth 2 (two) times daily.     Historical Provider, MD  ondansetron (ZOFRAN) 4 MG tablet Take 4 mg by mouth every 8 (eight) hours as needed for nausea.  11/18/14   Historical Provider, MD  pantoprazole (PROTONIX) 40 MG tablet Take 40 mg  by mouth 2 (two) times daily. 06/16/13   Modena Jansky, MD  polyethylene glycol (MIRALAX / GLYCOLAX) packet Take 17 g by mouth daily. 04/12/15   Domenic Polite, MD  polyethylene glycol powder (GLYCOLAX/MIRALAX) powder Take 17 g by mouth daily. Mix in 8 oz of fluid. 04/12/15   Historical Provider, MD  torsemide (DEMADEX) 20 MG tablet Take 2 tablets (40 mg total) by mouth 2 (two) times daily. 04/01/15   Arnoldo Lenis, MD  traMADol (ULTRAM) 50 MG tablet Take 1 tablet (50 mg total) by mouth every 8 (eight) hours as needed. 04/07/15   Samantha J Rhyne, PA-C   No current facility-administered medications for this encounter.   Current Outpatient Prescriptions  Medication Sig Dispense Refill  . acetaminophen (TYLENOL) 325 MG tablet Take 1-2 tablets (325-650 mg total) by mouth every 6 (six) hours as needed for pain.    [name] amiodarone (PACERONE) 200 MG tablet Take 1 tablet (200 mg total) by mouth daily. 30 tablet 6  . aspirin EC 325 MG tablet Take 1 tablet (325 mg total) by mouth daily.    [name] atorvastatin (LIPITOR) 80 MG tablet Take 1 tablet (80 mg total) by mouth daily. (Patient taking differently: Take 80 mg by mouth daily at 6 PM. ) 30 tablet 6  . B Complex-C-Folic Acid (DIALYVITE 518) 0.8 MG TABS Take 1 tablet by mouth every evening.     . diltiazem (CARDIZEM) 30 MG tablet Take 1 tablet (30 mg total) by mouth 2 (two) times daily. 60 tablet 6  .  HYDROcodone-acetaminophen (NORCO/VICODIN) 5-325 MG per tablet Take 1 tablet by mouth every 6 (six) hours as needed for severe pain. 30 tablet 0  . hydrOXYzine (ATARAX/VISTARIL) 25 MG tablet Take 1 tablet (25 mg total) by mouth 3 (three) times daily as needed for itching. (Patient taking differently: Take 25 mg by mouth 2 (two) times daily. ) 90 tablet 0  . levothyroxine (SYNTHROID, LEVOTHROID) 100 MCG tablet Take 100 mcg by mouth daily.  12  . levothyroxine (SYNTHROID, LEVOTHROID) 125 MCG tablet Take 125 mcg by mouth every evening.   11  . meclizine (ANTIVERT) 25 MG tablet Take 25 mg by mouth 2 (two) times daily.     . ondansetron (ZOFRAN) 4 MG tablet Take 4 mg by mouth every 8 (eight) hours as needed for nausea.     . pantoprazole (PROTONIX) 40 MG tablet Take 40 mg by mouth 2 (two) times daily.    . polyethylene glycol (MIRALAX / GLYCOLAX) packet Take 17 g by mouth daily. 14 each 0  . polyethylene glycol powder (GLYCOLAX/MIRALAX) powder Take 17 g by mouth daily. Mix in 8 oz of fluid.  0  . torsemide (DEMADEX) 20 MG tablet Take 2 tablets (40 mg total) by mouth 2 (two) times daily. 120 tablet 6  . traMADol (ULTRAM) 50 MG tablet Take 1 tablet (50 mg total) by mouth every 8 (eight) hours as needed. 30 tablet 0   Labs: Basic Metabolic Panel:  Recent Labs Lab 04/10/15 0622 04/12/15 0640 05/03/2015 1917 04/21/2015 1918  NA 139 138 135 134*  K 4.3 3.9 5.7* 5.5*  CL 101 98* 95* 100*  CO2 26 26 19*  --   GLUCOSE 76 77 146* 142*  BUN 23* 18 44* 44*  CREATININE 5.63* 4.72* 8.42* 8.10*  CALCIUM 7.9* 8.1* 8.2*  --    Liver Function Tests:  Recent Labs Lab 04/08/15 2128 04/12/15 0640 04/19/2015 1917  AST 181* 122* 140*  ALT 59 63  69*  ALKPHOS 116 150* 194*  BILITOT 1.2 1.1 0.9  PROT 6.7 6.8 8.1  ALBUMIN 2.4* 2.1* 2.5*    Recent Labs Lab 04/08/15 2128  LIPASE 14*   No results for input(s): AMMONIA in the last 168 hours. CBC:  Recent Labs Lab 04/08/15 2128 04/09/15 0849  04/10/15 0622 04/11/15 0930 04/12/15 0640 04/07/2015 1917 04/20/2015 1918  WBC 10.7* 10.6* 9.4 9.1 8.9 10.3  --   NEUTROABS 8.6*  --   --   --   --  8.4*  --   HGB 8.9* 8.8* 8.2* 8.5* 9.2* 10.3* 12.9*  HCT 28.7* 28.1* 26.7* 27.6* 30.9* 33.1* 38.0*  MCV 85.7 85.4 85.9 86.0 87.3 85.8  --   PLT 190 160 193 227 237 258  --    Cardiac Enzymes: No results for input(s): CKTOTAL, CKMB, CKMBINDEX, TROPONINI in the last 168 hours. CBG: No results for input(s): GLUCAP in the last 168 hours. Iron Studies: No results for input(s): IRON, TIBC, TRANSFERRIN, FERRITIN in the last 72 hours. Studies/Results: Dg Chest Port 1 View  04/12/2015   CLINICAL DATA:  Abdominal pain. Weakness. Short of breath. Initial encounter.  EXAM: PORTABLE CHEST - 1 VIEW  COMPARISON:  04/09/2015.  FINDINGS: Cardiomegaly. Defibrillator pads overlie the chest. Monitoring leads project over the chest. Chronic pulmonary parenchymal scarring and opacity at the lung bases is similar to 04/09/2015 chest radiograph. No new airspace consolidation is identified allowing for overlying support apparatus. Median sternotomy/ CABG.  IMPRESSION: Cardiomegaly and chronic changes of the chest. Chronic pulmonary vascular congestion. No interval change or acute abnormality.   Electronically Signed   By: Dereck Ligas M.D.   On: 04/26/2015 20:13   Dg Abd Portable 1v  05/05/2015   CLINICAL DATA:  Weakness. Abdominal pain. Symptoms for 4 days. Initial encounter.  EXAM: PORTABLE ABDOMEN - 1 VIEW  COMPARISON:  CT 04/09/2015.  FINDINGS: Aortoiliac stent graft. Prostate brachytherapy seeds are present. The bowel gas pattern is normal. No dilated loops of large or small bowel. Gas is present within the rectum. Atherosclerosis.  IMPRESSION: No acute abnormality.  Atherosclerosis and aortoiliac stent graft.   Electronically Signed   By: Dereck Ligas M.D.   On: 04/19/2015 20:14    ROS: Pertinent items are noted in HPI. Physical Exam: Filed Vitals:   04/09/2015  1828 04/11/2015 1915 04/29/2015 1945 04/18/2015 2035  BP: 108/49 114/47 108/49 114/44  Pulse: 51 50 49 48  Temp: 98.5 F (36.9 C)     Resp: 18 24 24 24   Weight: 81.194 kg (179 lb)     SpO2: 99% 98% 97% 96%      Weight change:  No intake or output data in the 24 hours ending 04/14/2015 2053 BP 114/44 mmHg  Pulse 48  Temp(Src) 98.5 F (36.9 C)  Resp 24  Wt 81.194 kg (179 lb)  SpO2 96% General appearance: fatigued and slowed mentation Head: Normocephalic, without obvious abnormality, atraumatic Eyes: negative findings: lids and lashes normal, conjunctivae and sclerae normal and corneas clear Neck: no adenopathy, no carotid bruit, supple, symmetrical, trachea midline and thyroid not enlarged, symmetric, no tenderness/mass/nodules Resp: rales bibasilar Cardio: bradycardic, no rub GI: +BS, soft, +tenderness to palpation Extremities: edema trace pedal R>L and LUE AVF +T/B, no embolic changes Dialysis Access: LUA AVF  OP HD: MWF [facility] 4h 85kg 2/2.25 bath Heparin none (allergic) LUA AVF Per staff patient only goes on Mondays' and Friday's for HD   Problem/Plan: 1. Abd pain , unclear cause work up  per [facility] and admit team.  Plans for abdominal CT with contrast today. 2. AAA S/P stent graft to AAA POD 8 per VVS. 3. ESRD on HD missed one HD per wk routinely per staff- plan for HD today 4. Volume- has evidence of overload plan for UF.  This patient needs 3 HD sessions/week. 5. Anemia Started Aranesp 60 q weekly on 04/06/15 6. Hyperkalemia- plan for HD tonight 7. 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 Lynnette Caffey stopped his binder recenly Fu am labs 8. Afib / flutter on amio/ dilt; has junctional rhythm on EKG today. No coumadin pt preference  Pearl Bents A 04/18/2015, 8:53 PM

## 2015-04-14 NOTE — H&P (Signed)
[facility] History and Physical  Patient: [name]  MRN: [id number]  DOB: [birth year]  DOS: the patient was seen and examined on 04/10/2015 PCP: VYAS,DHRUV B., MD  Chief Complaint: Abdominal pain and weakness  HPI: [name] is a 81 y.o. male with Past medical history of hypertension, coronary artery disease status post CABG, atrial flutter on diltiazem and amiodarone not on anticoagulation, chronic combined CHF, GERD, recent endovascular repair of AAA with recent endovascular leak, hypothyroidism, rheumatoid arthritis, peripheral vascular disease. The patient is presenting with complaints of abdominal pain. The patient was recently hospitalized for endovascular AAA repair earlier in the June. He is postoperative day 8. He was discharged home and presented to ER again on 04-08-15 with the complaints of recurrent abdominal pain. Workup was unremarkable and the patient was sent home again. Patient presents today with complaints of abdominal pain which has been chronic and present since last one month and has not improved yet. The pain has no association with foodand with movement no sedation with bowel movement. Patient has nausea but no vomiting. The pain is located centrally across her abdomen and feels like a sharp stabbing pain. She denies any constipation. He has a bowel movement earlier in the morning. He denies any chest pain palpitation dizziness lightheadedness. He complains of generalized fatigue and weakness since last few days. He has been scheduled for dialysis Monday Wednesday Friday but only goes on Monday and Friday and skips Wednesday dialysis. He mentions he has been urinating in the past but has decreased urination since last few days as well. He mentions he is compliant with all his medications. He denies any focal deficit.  The patient is coming from home. And at his baseline independent for most of his ADL.  Review of Systems: as mentioned in the  history of present illness.  A comprehensive review of the other systems is negative.  Past Medical History  Diagnosis Date  . Hypertension   . 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 Coronary artery disease     a. 05/2013 NSTEMI/Cath: LM 90d, LAD 90ost/33m, LCX 90ost, OM1 90p, RCA 100p;  b. 05/2013 CABG x 4: LIMA->LAD, VG->RI->OM, VG->PDA.  [name] Atrial flutter     a. Dx 05/2013-->dilt/coumadin.  [name] DVT (deep venous thrombosis) 02/2011    LLL,  as of 2016 pt. reports the DVT is resolved  . GERD (gastroesophageal reflux disease)   . Compression fracture of cervical spine   . Stasis dermatitis     LLL, wears TED hose  . Chronic systolic CHF (congestive heart failure)     EF 45-55% per echo 05/28/13  . Hyperlipidemia   . Dysphagia     a. after prolonged intubation 05/2013; b. 06/2013 refused EDG/Dilatation.  . Myocardial infarction   . Hypothyroidism   . Stroke     denies  . Constipation   . Dysrhythmia   . Varicose veins   . Peripheral arterial disease   . AAA (abdominal aortic aneurysm)   . 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 Depression     pt. reports that he doesn't take any med. for "sadness", but he admits to depression    . ESRD on dialysis 06/2013    Mon and Fri in [place]  . Rheumatoid arthritis     stopped methotrexate 2 yrs. ago  . Prostate cancer     pt. reports that he was on a table for 30 days, states "  this things went around my body" for 3 days. Pt. reports that he doesn't believe he even had prostate ca   . Renal insufficiency    Past Surgical History  Procedure Laterality Date  . Insertion of dialysis catheter N/A 05/26/2013    Procedure: INSERTION OF DIALYSIS CATHETER;  Surgeon: Elam Dutch, MD;  Location: [place];  Service: Vascular;  Laterality: N/A;  right IJ  . Cardiac surgery    . Cataract extraction    . Av fistula placement Left 07/08/2013    Procedure: INSERTION OF ARTERIOVENOUS (AV) GORE-TEX GRAFT ARM;  Surgeon: Elam Dutch, MD;  Location: [place];  Service: Vascular;  Laterality: Left;  . Coronary artery bypass graft N/A 05/13/2013    Procedure: CORONARY ARTERY BYPASS GRAFTING (CABG);  Surgeon: Gaye Pollack, MD;  Location: [place];  Service: Open Heart Surgery;  Laterality: N/A;  . Av fistula placement Left 02/03/2014    Procedure: INSERTION OF ARTERIOVENOUS (AV) GORE-TEX GRAFT USING 4-7 MM X 45 CM STRETCH GORETEX GRAFT;  Surgeon: Elam Dutch, MD;  Location: [place];  Service: Vascular;  Laterality: Left;  . Left heart catheterization with coronary angiogram N/A 05/13/2013    Procedure: LEFT HEART CATHETERIZATION WITH CORONARY ANGIOGRAM;  Surgeon: Burnell Blanks, MD;  Location: [facility] CATH LAB;  Service: Cardiovascular;  Laterality: N/A;  . Eye surgery Bilateral     cataract removed, ? IOL  . Abdominal aortic endovascular stent graft N/A 04/06/2015    Procedure: ABDOMINAL AORTIC ENDOVASCULAR STENT GRAFT;  Surgeon: Elam Dutch, MD;  Location: [facility];  Service: Vascular;  Laterality: N/A;  . Endarterectomy femoral Right 04/06/2015    Procedure: Right ENDARTERECTOMY FEMORAL;  Surgeon: Elam Dutch, MD;  Location: [place];  Service: Vascular;  Laterality: Right;  . Patch angioplasty Right 04/06/2015    Procedure: Right Femoral PATCH ANGIOPLASTY;  Surgeon: Elam Dutch, MD;  Location: [place];  Service: Vascular;  Laterality: Right;   Social History:  reports that he quit smoking about 14 years ago. His smoking use included Cigarettes. He started smoking about 54 years ago. He has a 47 pack-year smoking history. He has never used smokeless tobacco. He reports that he does not drink alcohol or use illicit drugs.  Allergies  Allergen Reactions  . Heparin Other (See Comments)    HIT ab+ but SRA negative    Family History  Problem Relation Age of Onset  . Heart attack Mother     deceased @ 49  . Kidney failure Father     deceased @ 8  . Heart attack Son     deceased @ 57    Prior to Admission medications   Medication Sig  Start Date End Date Taking? Authorizing Provider  acetaminophen (TYLENOL) 325 MG tablet Take 1-2 tablets (325-650 mg total) by mouth every 6 (six) hours as needed for pain. 06/16/13   Modena Jansky, MD  amiodarone (PACERONE) 200 MG tablet Take 1 tablet (200 mg total) by mouth daily. 02/01/15   Arnoldo Lenis, MD  aspirin EC 325 MG tablet Take 1 tablet (325 mg total) by mouth daily. 04/27/14   Arnoldo Lenis, MD  atorvastatin (LIPITOR) 80 MG tablet Take 1 tablet (80 mg total) by mouth daily. Patient taking differently: Take 80 mg by mouth daily at 6 PM.  10/20/14   Arnoldo Lenis, MD  B Complex-C-Folic Acid (DIALYVITE 944) 0.8 MG TABS Take 1 tablet by mouth every evening.  11/23/14  Historical Provider, MD  diltiazem (CARDIZEM) 30 MG tablet Take 1 tablet (30 mg total) by mouth 2 (two) times daily. 12/31/14   Arnoldo Lenis, MD  HYDROcodone-acetaminophen (NORCO/VICODIN) 5-325 MG per tablet Take 1 tablet by mouth every 6 (six) hours as needed for severe pain. 04/12/15   Domenic Polite, MD  hydrOXYzine (ATARAX/VISTARIL) 25 MG tablet Take 1 tablet (25 mg total) by mouth 3 (three) times daily as needed for itching. Patient taking differently: Take 25 mg by mouth 2 (two) times daily.  07/03/13   Ricke Hey, MD  levothyroxine (SYNTHROID, LEVOTHROID) 100 MCG tablet Take 100 mcg by mouth daily. 01/15/15   Historical Provider, MD  levothyroxine (SYNTHROID, LEVOTHROID) 125 MCG tablet Take 125 mcg by mouth every evening.  03/16/15   Historical Provider, MD  meclizine (ANTIVERT) 25 MG tablet Take 25 mg by mouth 2 (two) times daily.     Historical Provider, MD  ondansetron (ZOFRAN) 4 MG tablet Take 4 mg by mouth every 8 (eight) hours as needed for nausea.  11/18/14   Historical Provider, MD  pantoprazole (PROTONIX) 40 MG tablet Take 40 mg by mouth 2 (two) times daily. 06/16/13   Modena Jansky, MD  polyethylene glycol (MIRALAX / GLYCOLAX) packet Take 17 g by mouth daily. 04/12/15   Domenic Polite,  MD  polyethylene glycol powder (GLYCOLAX/MIRALAX) powder Take 17 g by mouth daily. Mix in 8 oz of fluid. 04/12/15   Historical Provider, MD  torsemide (DEMADEX) 20 MG tablet Take 2 tablets (40 mg total) by mouth 2 (two) times daily. 04/01/15   Arnoldo Lenis, MD  traMADol (ULTRAM) 50 MG tablet Take 1 tablet (50 mg total) by mouth every 8 (eight) hours as needed. 04/07/15   Gabriel Earing, PA-C    Physical Exam: Filed Vitals:   04/12/2015 2145 04/07/2015 2200 04/13/2015 2215 04/30/2015 2245  BP: 110/39 107/38 102/37 97/38  Pulse: 46 47 46 44  Temp:      Resp: 24 23 21 19   Weight:      SpO2: 98% 96% 96% 96%    General: Alert, Awake and Oriented to Time, Place and Person. Appear in mild distress Eyes: PERRL ENT: Oral Mucosa clear moist. Neck: Mild JVD Cardiovascular: S1 and S2 Present, aortic systolic Murmur, Peripheral Pulses Present Respiratory: Bilateral Air entry equal and Decreased,  Basal Crackles, no wheezes Abdomen: Bowel Sound present but significantly sluggish, Soft and diffusely tender Skin: no Rash Extremities: Bilateral Pedal edema, no calf tenderness Neurologic: Grossly no focal neuro deficit.  Labs on Admission:  CBC:  Recent Labs Lab 04/08/15 2128  04/10/15 0622 04/11/15 0930 04/12/15 0640 04/13/2015 1917 05/03/2015 1918 04/16/2015 2212  WBC 10.7*  < > 9.4 9.1 8.9 10.3  --  9.9  NEUTROABS 8.6*  --   --   --   --  8.4*  --   --   HGB 8.9*  < > 8.2* 8.5* 9.2* 10.3* 12.9* 9.3*  HCT 28.7*  < > 26.7* 27.6* 30.9* 33.1* 38.0* 30.2*  MCV 85.7  < > 85.9 86.0 87.3 85.8  --  86.0  PLT 190  < > 193 227 237 258  --  212  < > = values in this interval not displayed.  CMP     Component Value Date/Time   NA 135 04/25/2015 2251   K 5.8* 04/23/2015 2251   CL 96* 04/25/2015 2251   CO2 16* 04/22/2015 2251   GLUCOSE 90 04/16/2015 2251   BUN 45* 04/08/2015  2251   CREATININE 8.56* 05/03/2015 2251   CALCIUM 7.9* 04/19/2015 2251   PROT 8.1 05/02/2015 1917   ALBUMIN 2.2* 04/10/2015  2251   AST 140* 04/21/2015 1917   ALT 69* 04/10/2015 1917   ALKPHOS 194* 04/23/2015 1917   BILITOT 0.9 04/19/2015 1917   GFRNONAA 5* 04/07/2015 2251   GFRAA 6* 05/04/2015 2251     Recent Labs Lab 04/08/15 2128 04/06/2015 2251  LIPASE 14* 43    No results for input(s): CKTOTAL, CKMB, CKMBINDEX, TROPONINI in the last 168 hours. BNP (last 3 results)  Recent Labs  04/13/2015 1917  BNP >4500.0*    ProBNP (last 3 results) No results for input(s): PROBNP in the last 8760 hours.   Radiological Exams on Admission: Dg Chest Port 1 View  04/30/2015   CLINICAL DATA:  Abdominal pain. Weakness. Short of breath. Initial encounter.  EXAM: PORTABLE CHEST - 1 VIEW  COMPARISON:  04/09/2015.  FINDINGS: Cardiomegaly. Defibrillator pads overlie the chest. Monitoring leads project over the chest. Chronic pulmonary parenchymal scarring and opacity at the lung bases is similar to 04/09/2015 chest radiograph. No new airspace consolidation is identified allowing for overlying support apparatus. Median sternotomy/ CABG.  IMPRESSION: Cardiomegaly and chronic changes of the chest. Chronic pulmonary vascular congestion. No interval change or acute abnormality.   Electronically Signed   By: Dereck Ligas M.D.   On: 04/26/2015 20:13   Dg Abd Portable 1v  04/24/2015   CLINICAL DATA:  Weakness. Abdominal pain. Symptoms for 4 days. Initial encounter.  EXAM: PORTABLE ABDOMEN - 1 VIEW  COMPARISON:  CT 04/09/2015.  FINDINGS: Aortoiliac stent graft. Prostate brachytherapy seeds are present. The bowel gas pattern is normal. No dilated loops of large or small bowel. Gas is present within the rectum. Atherosclerosis.  IMPRESSION: No acute abnormality.  Atherosclerosis and aortoiliac stent graft.   Electronically Signed   By: Dereck Ligas M.D.   On: 04/13/2015 20:14   Ct Angio Abd/pel W/ And/or W/o  04/19/2015   CLINICAL DATA:  81 year old male with a history of prior abdominal aortic aneurysm repair with endograft. Recent  discharge.  EXAM: CTA ABDOMEN AND PELVIS wITHOUT AND WITH CONTRAST  TECHNIQUE: Multidetector CT imaging of the abdomen and pelvis was performed using the standard protocol during bolus administration of intravenous contrast. Multiplanar reconstructed images and MIPs were obtained and reviewed to evaluate the vascular anatomy.  CONTRAST:  127mL OMNIPAQUE IOHEXOL 350 MG/ML SOLN  COMPARISON:  CT 04/09/2015, 03/29/2015, 02/17/2015  FINDINGS: Lower chest:  Superficial soft tissues the lower chest unremarkable. Surgical changes of median sternotomy.  Heart size enlarged, similar to prior. Calcifications of the left main, left anterior descending, circumflex, right coronary arteries.  No pericardial fluid/ thickening.  Respiratory motion limits evaluation. Atelectasis at the bilateral lung bases. Ground-glass opacity in the right middle lobe, improved from the prior. Trace bilateral pleural effusions.  Abdomen/pelvis:  Liver with irregular/ nodular contour, similar to the comparison.  Unremarkable appearance of spleen.  Unremarkable appearance of the pancreas.  Distended gallbladder with high density material, similar to the comparison. This compatible with vicarious excretion of contrast.  Unremarkable bilateral adrenal glands.  Bilateral kidneys are atrophic, similar to the comparison. No hydronephrosis. Vascular calcifications versus nonobstructive stones in the hilum of the bilateral kidneys.  Enteric contrast within the colon, without transition point. No abnormally distended small bowel or colon to indicate obstruction. Multiple colonic diverticula, without associated inflammatory changes. Normal appendix.  Small amount of free fluid within the dependent pelvis, with low  Hounsfield units.  Small amount of free fluid overlying the liver subjacent to the diaphragm, similar to the comparison.  Circumferential thickening of the urinary bladder wall.  Vascular:  Again demonstrated are postsurgical changes of endovascular  repair with Gore endograft, with right femoral main body delivery.  The proximal aspect of the endograft is just inferior to the bilateral renal arteries. The bilateral iliac limbs terminate within the common iliac arteries.  Greatest diameter of the excluded aneurysm sac measures 5.6 cm is unchanged from the comparison CT. The greatest diameter measures 6.6 cm on image 87, which is slightly less than the comparison CT.  On this single phase CT, there is no evidence of differential attenuation within the aneurysm sac to suggest ongoing endoleak. The previous gas has resolved.  Celiac artery and superior mesenteric artery remain patent. Bilateral iliac arteries remain patent with associated atherosclerotic changes.  Re- demonstration of hematoma/seroma at the right common femoral artery, which is similar in size to the comparison.  No displaced fracture. Multilevel degenerative changes of the spine.  Review of the MIP images confirms the above findings.  IMPRESSION: Status post endovascular repair of infrarenal abdominal aortic aneurysm, with unchanged size of the excluded aneurysm sac on the current CT compared to the prior.  The differential attenuation of the excluded aneurysm sac is no longer present, with no evidence on the current single phase study of endoleak. Continued surveillance is recommended.  Small hematoma/ seroma at the right common femoral artery main body delivery access site, unchanged from the comparison.  Small amount of low-density free fluid within the anatomic pelvis, of uncertain significance, potentially reactive, or otherwise related 2 positive fluid status. There is similar appearance of free fluid under the right hemidiaphragm as compared to the prior CT.  Signed,  Dulcy Fanny. Earleen Newport, DO  [facility]  [facility]   Electronically Signed   By: Corrie Mckusick D.O.   On: 04/16/2015 22:14   EKG: Independently reviewed. prolonged QT interval, QTC  more than 600, junctional bradycardia.  Assessment/Plan Principal Problem:   ESRD (end stage renal disease) Active Problems:   S/P CABG x 4   Anemia   Atrial flutter   History of repair of aneurysm of abdominal aorta using endovascular stent graft   Abdominal pain   Hyperkalemia   Junctional bradycardia   CHF exacerbation   1. ESRD (end stage renal disease) The patient is presenting with complaints of nausea as well as abdominal pain. He appears to be volume overloaded and clinical examination. He has a symptoms of uremia with nausea as well as hyperkalemia. Nephrology has been consulted and has arranged for hemodialysis for the patient. Patient was requested to continue hemodialysis Monday Wednesday Friday as scheduled and the remaining compliant. Patient will be closely monitored in stepdown unit.  2. Chronic recurrent abdominal pain. The patient is presenting with chronic recurrent abdominal pain. Multiple CT scan of the abdomen and ultrasound of the abdomen are unremarkable in the past. Today's CT scan is showing no acute abnormality in his bowels or spleen or pancreas. His adrenals are unremarkable as well. He has circumferential thickening of the urinary bladder wall. Gallbladder is distended but no evidence of cholecystitis. With this at this unclear office cause of abdominal pain. Currently due to his soft blood pressure cautious use of narcotics as well as a antiemetics. Check lipase level lactic acid level.  3. AAA repair with endovascular stent. Patient recently had endovascular repair of the AAA. CT  scan of the abdomen is showing improvement in the endovascular leak and on does not show any acute abnormality as compared to prior CAT scan. Last admission vascular surgery did not felt that the patient's abdominal pain is resulting from his recent procedure or any other abdominal vascular etiology. Due to his recent the endovascular bleed holding off on pharmacologic  DVT prophylaxis.  4. Junctional bradycardia, prolonged QTC. History of a flutter,. The patient has history of a flutter and is on amiodarone and Cardizem and cardiology has been consulted due to his junctional bradycardia and at present feels that this is medication induced and will resolve with stopping amiodarone and Cardizem. Currently holding both of them. Avoiding QT prolongation medication. Monitoring on telemetry.  5. Hyperkalemia. Avoiding Kayexalate secondary to chronic abdominal pain.  Advance goals of care discussion: Full code   Consults: ED physician discussed with cardiology as well as nephrology.  DVT Prophylaxis: mechanical compression device Nutrition: Nothing by mouth except medication  Disposition: Admitted as inpatient, step-down unit.  Author: Berle Mull, MD [facility] Pager: [phone] 04/22/2015   Addendum: Patient's lipase levels are negative, lactic acid is more than 7. Patient continues to have abdominal pain. Discussed with critical care Dr. Nelda Marseille on call who recommends that most likely patient's elevated lactic acid is secondary to his poor clearance from his ESRD as well as worsening LFT. Recommend to continue close monitoring. We will recheck lactic acid level and discussed with surgery if it remains elevated. Patient will be given low-dose IV albumin.  Repeat EKG also shows continue prolonged QTC as well as junctional bradycardia with wide QRS. We will give calcium gluconate as well as IV bicarbonate.  Teondre Jarosz 2:12 AM 2015-05-04    If 7PM-7AM, please contact night-coverage [email or web address] Password [email or web address]

## 2015-04-14 NOTE — ED Provider Notes (Signed)
CSN: [id number]     Arrival date & time 04/07/2015  1816 History   First MD Initiated Contact with Patient 04/22/2015 1853     Chief Complaint  Patient presents with  . Abdominal Pain  . Weakness     (Consider location/radiation/quality/duration/timing/severity/associated sxs/prior Treatment) Patient is a 81 y.o. male presenting with abdominal pain.  Abdominal Pain Pain location:  Generalized Pain quality: aching   Pain radiates to:  Does not radiate Pain severity:  Moderate Onset quality:  Gradual Duration:  3 days Timing:  Constant Progression:  Waxing and waning Context: laxative use and previous surgery (patient recently had aortic grafting for AAA.)   Context: not alcohol use, not awakening from sleep, not eating, not retching, not suspicious food intake and not trauma   Relieved by:  Nothing Worsened by:  Nothing tried Ineffective treatments: Laxitives, will softeners. Associated symptoms: fatigue and shortness of breath   Associated symptoms: no anorexia, no chest pain, no chills, no constipation, no cough, no diarrhea, no dysuria, no fever, no melena, no nausea, no sore throat and no vomiting   Risk factors: recent hospitalization   Risk factors: no alcohol abuse, no aspirin use and not elderly     Past Medical History  Diagnosis Date  . Hypertension   . 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 Coronary artery disease     a. 05/2013 NSTEMI/Cath: LM 90d, LAD 90ost/3m, LCX 90ost, OM1 90p, RCA 100p;  b. 05/2013 CABG x 4: LIMA->LAD, VG->RI->OM, VG->PDA.  [name] Atrial flutter     a. Dx 05/2013-->dilt/coumadin.  [name] DVT (deep venous thrombosis) 02/2011    LLL,  as of 2016 pt. reports the DVT is resolved  . GERD (gastroesophageal reflux disease)   . Compression fracture of cervical spine   . Stasis dermatitis     LLL, wears TED hose  . Chronic systolic CHF (congestive heart failure)     EF 45-55% per echo 05/28/13  . Hyperlipidemia   . Dysphagia     a. after prolonged intubation  05/2013; b. 06/2013 refused EDG/Dilatation.  . Myocardial infarction   . Hypothyroidism   . Stroke     denies  . Constipation   . Dysrhythmia   . Varicose veins   . Peripheral arterial disease   . AAA (abdominal aortic aneurysm)   . 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 Depression     pt. reports that he doesn't take any med. for "sadness", but he admits to depression    . ESRD on dialysis 06/2013    Mon and Fri in [place]  . Rheumatoid arthritis     stopped methotrexate 2 yrs. ago  . Prostate cancer     pt. reports that he was on a table for 30 days, states "this things went around my body" for 3 days. Pt. reports that he doesn't believe he even had prostate ca   . Renal insufficiency    Past Surgical History  Procedure Laterality Date  . Insertion of dialysis catheter N/A 05/26/2013    Procedure: INSERTION OF DIALYSIS CATHETER;  Surgeon: Elam Dutch, MD;  Location: [facility];  Service: Vascular;  Laterality: N/A;  right IJ  . Cardiac surgery    . Cataract extraction    . Av fistula placement Left 07/08/2013    Procedure: INSERTION OF ARTERIOVENOUS (AV) GORE-TEX GRAFT ARM;  Surgeon: Elam Dutch, MD;  Location: [facility];  Service: Vascular;  Laterality: Left;  .  Coronary artery bypass graft N/A 05/13/2013    Procedure: CORONARY ARTERY BYPASS GRAFTING (CABG);  Surgeon: Gaye Pollack, MD;  Location: [facility];  Service: Open Heart Surgery;  Laterality: N/A;  . Av fistula placement Left 02/03/2014    Procedure: INSERTION OF ARTERIOVENOUS (AV) GORE-TEX GRAFT USING 4-7 MM X 45 CM STRETCH GORETEX GRAFT;  Surgeon: Elam Dutch, MD;  Location: [facility];  Service: Vascular;  Laterality: Left;  . Left heart catheterization with coronary angiogram N/A 05/13/2013    Procedure: LEFT HEART CATHETERIZATION WITH CORONARY ANGIOGRAM;  Surgeon: Burnell Blanks, MD;  Location[facility] C[facility] LAB;  Service: Cardiovascular;  Laterality: N/A;  . Eye surgery Bilateral     cataract removed, ? IOL  . Abdominal  aortic endovascular stent graft N/A 04/06/2015    Procedure: ABDOMINAL AORTIC ENDOVASCULAR STENT GRAFT;  Surgeon: Elam Dutch, MD;  Location: [facility] OR;  Service: Vascular;  Laterality: N/A;  . Endarterectomy femoral Right 04/06/2015    Procedure: Right ENDARTERECTOMY FEMORAL;  Surgeon: Elam Dutch, MD;  Location: [facility] OR;  Service: Vascular;  Laterality: Right;  . Patch angioplasty Right 04/06/2015    Procedure: Right Femoral PATCH ANGIOPLASTY;  Surgeon: Elam Dutch, MD;  Location: [facility] OR;  Service: Vascular;  Laterality: Right;   Family History  Problem Relation Age of Onset  . Heart attack Mother     deceased @ 102  . Kidney failure Father     deceased @ 21  . Heart attack Son     deceased @ 11   History  Substance Use Topics  . Smoking status: Former Smoker -- 1.00 packs/day for 47 years    Types: Cigarettes    Start date: 11/30/1960    Quit date: 11/05/2000  . Smokeless tobacco: Never Used  . Alcohol Use: No     Comment: Previously drank 6 beers/day, quit in 1994    Review of Systems  Constitutional: Positive for fatigue. Negative for fever, chills and appetite change.  HENT: Negative for congestion, ear pain, facial swelling, mouth sores and sore throat.   Eyes: Negative for visual disturbance.  Respiratory: Positive for shortness of breath. Negative for cough and chest tightness.   Cardiovascular: Negative for chest pain and palpitations.  Gastrointestinal: Positive for abdominal pain. Negative for nausea, vomiting, diarrhea, constipation, blood in stool, melena, abdominal distention and anorexia.  Endocrine: Negative for cold intolerance and heat intolerance.  Genitourinary: Negative for dysuria, frequency, decreased urine volume and difficulty urinating.  Musculoskeletal: Negative for back pain and neck stiffness.  Skin: Negative for rash.  Neurological: Negative for dizziness, weakness, light-headedness and headaches.  All other systems reviewed and are  negative.     Allergies  Heparin  Home Medications   Prior to Admission medications   Medication Sig Start Date End Date Taking? Authorizing Provider  acetaminophen (TYLENOL) 325 MG tablet Take 1-2 tablets (325-650 mg total) by mouth every 6 (six) hours as needed for pain. 06/16/13   Modena Jansky, MD  amiodarone (PACERONE) 200 MG tablet Take 1 tablet (200 mg total) by mouth daily. 02/01/15   Arnoldo Lenis, MD  aspirin EC 325 MG tablet Take 1 tablet (325 mg total) by mouth daily. 04/27/14   Arnoldo Lenis, MD  atorvastatin (LIPITOR) 80 MG tablet Take 1 tablet (80 mg total) by mouth daily. Patient taking differently: Take 80 mg by mouth daily at 6 PM.  10/20/14   Arnoldo Lenis, MD  B Complex-C-Folic Acid (DIALYVITE 017) 0.8  MG TABS Take 1 tablet by mouth every evening.  11/23/14   Historical Provider, MD  diltiazem (CARDIZEM) 30 MG tablet Take 1 tablet (30 mg total) by mouth 2 (two) times daily. 12/31/14   Arnoldo Lenis, MD  HYDROcodone-acetaminophen (NORCO/VICODIN) 5-325 MG per tablet Take 1 tablet by mouth every 6 (six) hours as needed for severe pain. 04/12/15   Domenic Polite, MD  hydrOXYzine (ATARAX/VISTARIL) 25 MG tablet Take 1 tablet (25 mg total) by mouth 3 (three) times daily as needed for itching. Patient taking differently: Take 25 mg by mouth 2 (two) times daily.  07/03/13   Ricke Hey, MD  levothyroxine (SYNTHROID, LEVOTHROID) 100 MCG tablet Take 100 mcg by mouth daily. 01/15/15   Historical Provider, MD  levothyroxine (SYNTHROID, LEVOTHROID) 125 MCG tablet Take 125 mcg by mouth every evening.  03/16/15   Historical Provider, MD  meclizine (ANTIVERT) 25 MG tablet Take 25 mg by mouth 2 (two) times daily.     Historical Provider, MD  ondansetron (ZOFRAN) 4 MG tablet Take 4 mg by mouth every 8 (eight) hours as needed for nausea.  11/18/14   Historical Provider, MD  pantoprazole (PROTONIX) 40 MG tablet Take 40 mg by mouth 2 (two) times daily. 06/16/13   Modena Jansky, MD  polyethylene glycol (MIRALAX / GLYCOLAX) packet Take 17 g by mouth daily. 04/12/15   Domenic Polite, MD  polyethylene glycol powder (GLYCOLAX/MIRALAX) powder Take 17 g by mouth daily. Mix in 8 oz of fluid. 04/12/15   Historical Provider, MD  torsemide (DEMADEX) 20 MG tablet Take 2 tablets (40 mg total) by mouth 2 (two) times daily. 04/01/15   Arnoldo Lenis, MD  traMADol (ULTRAM) 50 MG tablet Take 1 tablet (50 mg total) by mouth every 8 (eight) hours as needed. 04/07/15   Samantha J Rhyne, PA-C   BP 115/28 mmHg  Pulse 45  Temp(Src) 96 F (35.6 C) (Oral)  Resp 21  Wt 179 lb (81.194 kg)  SpO2 97% Physical Exam  Constitutional: He is oriented to person, place, and time. He appears well-nourished. No distress.  HENT:  Head: Normocephalic and atraumatic.  Right Ear: External ear normal.  Left Ear: External ear normal.  Eyes: Pupils are equal, round, and reactive to light. Right eye exhibits no discharge. Left eye exhibits no discharge. No scleral icterus.  Neck: Normal range of motion. Neck supple.  Cardiovascular: Normal rate.  Exam reveals no gallop and no friction rub.   No murmur heard. Pulmonary/Chest: Effort normal and breath sounds normal. No stridor. No respiratory distress. He has no wheezes. He has no rales. He exhibits no tenderness.  Abdominal: Soft. He exhibits no distension and no mass. There is generalized tenderness. There is no rigidity, no rebound, no guarding, no CVA tenderness, no tenderness at McBurney's point and negative Murphy's sign. No hernia.  Genitourinary:  Surgical incisions in the right inguinal area clean and dry and intact. Catheterization site in the left inguinal area clean and dry and intact.  Musculoskeletal: He exhibits no edema or tenderness.  Neurological: He is alert and oriented to person, place, and time.  Skin: Skin is warm and dry. No rash noted. He is not diaphoretic. No erythema.    ED Course  Procedures (including critical care  time) Labs Review Labs Reviewed  CBC WITH DIFFERENTIAL/PLATELET - Abnormal; Notable for the following:    RBC 3.86 (*)    Hemoglobin 10.3 (*)    HCT 33.1 (*)    RDW 20.8 (*)  Neutrophils Relative % 81 (*)    Neutro Abs 8.4 (*)    All other components within normal limits  COMPREHENSIVE METABOLIC PANEL - Abnormal; Notable for the following:    Potassium 5.7 (*)    Chloride 95 (*)    CO2 19 (*)    Glucose, Bld 146 (*)    BUN 44 (*)    Creatinine, Ser 8.42 (*)    Calcium 8.2 (*)    Albumin 2.5 (*)    AST 140 (*)    ALT 69 (*)    Alkaline Phosphatase 194 (*)    GFR calc non Af Amer 5 (*)    GFR calc Af Amer 6 (*)    Anion gap 21 (*)    All other components within normal limits  PROTIME-INR - Abnormal; Notable for the following:    Prothrombin Time 15.9 (*)    All other components within normal limits  BRAIN NATRIURETIC PEPTIDE - Abnormal; Notable for the following:    B Natriuretic Peptide >4500.0 (*)    All other components within normal limits  RENAL FUNCTION PANEL - Abnormal; Notable for the following:    Potassium 5.8 (*)    Chloride 96 (*)    CO2 16 (*)    BUN 45 (*)    Creatinine, Ser 8.56 (*)    Calcium 7.9 (*)    Phosphorus 7.9 (*)    Albumin 2.2 (*)    GFR calc non Af Amer 5 (*)    GFR calc Af Amer 6 (*)    Anion gap 23 (*)    All other components within normal limits  CBC - Abnormal; Notable for the following:    RBC 3.51 (*)    Hemoglobin 9.3 (*)    HCT 30.2 (*)    RDW 20.9 (*)    All other components within normal limits  LACTIC ACID, PLASMA - Abnormal; Notable for the following:    Lactic Acid, Venous 7.9 (*)    All other components within normal limits  TSH - Abnormal; Notable for the following:    TSH 5.530 (*)    All other components within normal limits  T4, FREE - Abnormal; Notable for the following:    Free T4 1.14 (*)    All other components within normal limits  I-STAT CHEM 8, ED - Abnormal; Notable for the following:    Sodium 134 (*)     Potassium 5.5 (*)    Chloride 100 (*)    BUN 44 (*)    Creatinine, Ser 8.10 (*)    Glucose, Bld 142 (*)    Calcium, Ion 0.93 (*)    Hemoglobin 12.9 (*)    HCT 38.0 (*)    All other components within normal limits  LIPASE, BLOOD  LACTIC ACID, PLASMA  I-STAT TROPOININ, ED    Imaging Review Dg Chest Port 1 View  05/04/2015   CLINICAL DATA:  Abdominal pain. Weakness. Short of breath. Initial encounter.  EXAM: PORTABLE CHEST - 1 VIEW  COMPARISON:  04/09/2015.  FINDINGS: Cardiomegaly. Defibrillator pads overlie the chest. Monitoring leads project over the chest. Chronic pulmonary parenchymal scarring and opacity at the lung bases is similar to 04/09/2015 chest radiograph. No new airspace consolidation is identified allowing for overlying support apparatus. Median sternotomy/ CABG.  IMPRESSION: Cardiomegaly and chronic changes of the chest. Chronic pulmonary vascular congestion. No interval change or acute abnormality.   Electronically Signed   By: Dereck Ligas M.D.   On: 04/25/2015 20:13  Dg Abd Portable 1v  04/20/2015   CLINICAL DATA:  Weakness. Abdominal pain. Symptoms for 4 days. Initial encounter.  EXAM: PORTABLE ABDOMEN - 1 VIEW  COMPARISON:  CT 04/09/2015.  FINDINGS: Aortoiliac stent graft. Prostate brachytherapy seeds are present. The bowel gas pattern is normal. No dilated loops of large or small bowel. Gas is present within the rectum. Atherosclerosis.  IMPRESSION: No acute abnormality.  Atherosclerosis and aortoiliac stent graft.   Electronically Signed   By: Dereck Ligas M.D.   On: 04/18/2015 20:14   Ct Angio Abd/pel W/ And/or W/o  04/11/2015   CLINICAL DATA:  81 year old male with a history of prior abdominal aortic aneurysm repair with endograft. Recent discharge.  EXAM: CTA ABDOMEN AND PELVIS wITHOUT AND WITH CONTRAST  TECHNIQUE: Multidetector CT imaging of the abdomen and pelvis was performed using the standard protocol during bolus administration of intravenous contrast.  Multiplanar reconstructed images and MIPs were obtained and reviewed to evaluate the vascular anatomy.  CONTRAST:  186mL OMNIPAQUE IOHEXOL 350 MG/ML SOLN  COMPARISON:  CT 04/09/2015, 03/29/2015, 02/17/2015  FINDINGS: Lower chest:  Superficial soft tissues the lower chest unremarkable. Surgical changes of median sternotomy.  Heart size enlarged, similar to prior. Calcifications of the left main, left anterior descending, circumflex, right coronary arteries.  No pericardial fluid/ thickening.  Respiratory motion limits evaluation. Atelectasis at the bilateral lung bases. Ground-glass opacity in the right middle lobe, improved from the prior. Trace bilateral pleural effusions.  Abdomen/pelvis:  Liver with irregular/ nodular contour, similar to the comparison.  Unremarkable appearance of spleen.  Unremarkable appearance of the pancreas.  Distended gallbladder with high density material, similar to the comparison. This compatible with vicarious excretion of contrast.  Unremarkable bilateral adrenal glands.  Bilateral kidneys are atrophic, similar to the comparison. No hydronephrosis. Vascular calcifications versus nonobstructive stones in the hilum of the bilateral kidneys.  Enteric contrast within the colon, without transition point. No abnormally distended small bowel or colon to indicate obstruction. Multiple colonic diverticula, without associated inflammatory changes. Normal appendix.  Small amount of free fluid within the dependent pelvis, with low Hounsfield units.  Small amount of free fluid overlying the liver subjacent to the diaphragm, similar to the comparison.  Circumferential thickening of the urinary bladder wall.  Vascular:  Again demonstrated are postsurgical changes of endovascular repair with Gore endograft, with right femoral main body delivery.  The proximal aspect of the endograft is just inferior to the bilateral renal arteries. The bilateral iliac limbs terminate within the common iliac arteries.   Greatest diameter of the excluded aneurysm sac measures 5.6 cm is unchanged from the comparison CT. The greatest diameter measures 6.6 cm on image 87, which is slightly less than the comparison CT.  On this single phase CT, there is no evidence of differential attenuation within the aneurysm sac to suggest ongoing endoleak. The previous gas has resolved.  Celiac artery and superior mesenteric artery remain patent. Bilateral iliac arteries remain patent with associated atherosclerotic changes.  Re- demonstration of hematoma/seroma at the right common femoral artery, which is similar in size to the comparison.  No displaced fracture. Multilevel degenerative changes of the spine.  Review of the MIP images confirms the above findings.  IMPRESSION: Status post endovascular repair of infrarenal abdominal aortic aneurysm, with unchanged size of the excluded aneurysm sac on the current CT compared to the prior.  The differential attenuation of the excluded aneurysm sac is no longer present, with no evidence on the current single phase study of endoleak.  Continued surveillance is recommended.  Small hematoma/ seroma at the right common femoral artery main body delivery access site, unchanged from the comparison.  Small amount of low-density free fluid within the anatomic pelvis, of uncertain significance, potentially reactive, or otherwise related 2 positive fluid status. There is similar appearance of free fluid under the right hemidiaphragm as compared to the prior CT.  Signed,  Dulcy Fanny. Earleen Newport, DO  [facility]  [facility]   Electronically Signed   By: Corrie Mckusick D.O.   On: 04/24/2015 22:14     EKG Interpretation None      MDM   81 year old male with an extensive past medical history including A. fib, CAD status post CABG, CHF, ESRD on dialysis Monday and Friday, AAA status post aortic grafting who has had abdominal pain since his surgery and was recently  admitted and discharged on the seventh for the same presents with continued abdominal pain and nausea. History and exam as above. Additionally patient also endorses increased dyspnea on exertion. Last dialysis patient received was June 5. On arrival patient was bradycardic into the 30s however blood pressure was within normal limits and patient was mentating appropriately. Pacer pads were placed on the patient for precautionary purposes. Workup revealed volume overload/CHF exacerbation. CT with no evidence of specific etiology to explain the patient's abdominal pain. There is no evidence of endoleak. EKG with junctional rhythm, bradycardia, no evidence of acute ischemia. Similar to prior. Lactic acid 7.9. Nephrology consult at for emergent dialysis given the volume overload with CHF exacerbation.  Patient be admitted for further management.  Patient seen in conjunction with Dr. Audie Pinto.  Sibyl Parr, M.D. Resident Final diagnoses:  Pain Volume overload CHF exacerbation        Addison Lank, MD May 02, 2015 1497  Leonard Schwartz, MD 04/23/15 [phone]

## 2015-04-14 NOTE — ED Notes (Signed)
Patel, MD at bedside.  

## 2015-04-15 ENCOUNTER — Encounter (HOSPITAL_COMMUNITY): Payer: Self-pay | Admitting: Cardiovascular Disease

## 2015-04-15 ENCOUNTER — Encounter (HOSPITAL_COMMUNITY): Admission: EM | Disposition: E | Payer: Self-pay | Source: Home / Self Care | Attending: Internal Medicine

## 2015-04-15 DIAGNOSIS — R001 Bradycardia, unspecified: Principal | ICD-10-CM

## 2015-04-15 HISTORY — PX: CARDIAC CATHETERIZATION: SHX172

## 2015-04-15 LAB — TSH: TSH: 5.53 u[IU]/mL — ABNORMAL HIGH (ref 0.350–4.500)

## 2015-04-15 LAB — POCT I-STAT, CHEM 8
BUN: 44 mg/dL — ABNORMAL HIGH (ref 6–20)
Calcium, Ion: 1.11 mmol/L — ABNORMAL LOW (ref 1.13–1.30)
Chloride: 105 mmol/L (ref 101–111)
Creatinine, Ser: 8.2 mg/dL — ABNORMAL HIGH (ref 0.61–1.24)
Glucose, Bld: <20 mg/dL — CL (ref 65–99)
HCT: 32 % — ABNORMAL LOW (ref 39.0–52.0)
Hemoglobin: 10.9 g/dL — ABNORMAL LOW (ref 13.0–17.0)
POTASSIUM: 6.4 mmol/L — AB (ref 3.5–5.1)
SODIUM: 135 mmol/L (ref 135–145)
TCO2: 12 mmol/L (ref 0–100)

## 2015-04-15 LAB — COMPREHENSIVE METABOLIC PANEL
ALK PHOS: 175 U/L — AB (ref 38–126)
ALT: 273 U/L — ABNORMAL HIGH (ref 17–63)
ANION GAP: 32 — AB (ref 5–15)
AST: 1110 U/L — ABNORMAL HIGH (ref 15–41)
Albumin: 2 g/dL — ABNORMAL LOW (ref 3.5–5.0)
BUN: 46 mg/dL — ABNORMAL HIGH (ref 6–20)
CO2: 13 mmol/L — AB (ref 22–32)
CREATININE: 8.5 mg/dL — AB (ref 0.61–1.24)
Calcium: 10.3 mg/dL (ref 8.9–10.3)
Chloride: 99 mmol/L — ABNORMAL LOW (ref 101–111)
GFR, EST AFRICAN AMERICAN: 6 mL/min — AB (ref >60)
GFR, EST NON AFRICAN AMERICAN: 5 mL/min — AB (ref >60)
Glucose, Bld: <20 mg/dL — CL (ref 65–99)
Potassium: 6.9 mmol/L (ref 3.5–5.1)
SODIUM: 144 mmol/L (ref 135–145)
Total Bilirubin: 1.5 mg/dL — ABNORMAL HIGH (ref 0.3–1.2)
Total Protein: 6.2 g/dL — ABNORMAL LOW (ref 6.5–8.1)

## 2015-04-15 LAB — LACTIC ACID, PLASMA
LACTIC ACID, VENOUS: 7.9 mmol/L — AB (ref 0.5–2.0)
LACTIC ACID, VENOUS: 5.6 mmol/L — AB (ref 0.5–2.0)
Lactic Acid, Venous: 2.4 mmol/L (ref 0.5–2.0)

## 2015-04-15 LAB — CBC WITH DIFFERENTIAL/PLATELET
BASOS PCT: 0 % (ref 0–1)
Basophils Absolute: 0 10*3/uL (ref 0.0–0.1)
Eosinophils Absolute: 0 10*3/uL (ref 0.0–0.7)
Eosinophils Relative: 0 % (ref 0–5)
HEMATOCRIT: 28.8 % — AB (ref 39.0–52.0)
Hemoglobin: 8.8 g/dL — ABNORMAL LOW (ref 13.0–17.0)
LYMPHS ABS: 1.4 10*3/uL (ref 0.7–4.0)
LYMPHS PCT: 11 % — AB (ref 12–46)
MCH: 26.8 pg (ref 26.0–34.0)
MCHC: 30.6 g/dL (ref 30.0–36.0)
MCV: 87.8 fL (ref 78.0–100.0)
Monocytes Absolute: 0.9 10*3/uL (ref 0.1–1.0)
Monocytes Relative: 7 % (ref 3–12)
NEUTROS PCT: 82 % — AB (ref 43–77)
Neutro Abs: 10.8 10*3/uL — ABNORMAL HIGH (ref 1.7–7.7)
PLATELETS: 183 10*3/uL (ref 150–400)
RBC: 3.28 MIL/uL — ABNORMAL LOW (ref 4.22–5.81)
RDW: 21 % — AB (ref 11.5–15.5)
WBC: 13.2 10*3/uL — AB (ref 4.0–10.5)

## 2015-04-15 LAB — POCT I-STAT 3, ART BLOOD GAS (G3+)
Acid-base deficit: 14 mmol/L — ABNORMAL HIGH (ref 0.0–2.0)
Bicarbonate: 12.5 mEq/L — ABNORMAL LOW (ref 20.0–24.0)
O2 Saturation: 100 %
Patient temperature: 98.6
TCO2: 13 mmol/L (ref 0–100)
pCO2 arterial: 29.8 mmHg — ABNORMAL LOW (ref 35.0–45.0)
pH, Arterial: 7.23 — ABNORMAL LOW (ref 7.350–7.450)
pO2, Arterial: 256 mmHg — ABNORMAL HIGH (ref 80.0–100.0)

## 2015-04-15 LAB — PHOSPHORUS: Phosphorus: 10.9 mg/dL — ABNORMAL HIGH (ref 2.5–4.6)

## 2015-04-15 LAB — RETICULOCYTES
RBC.: 3.28 MIL/uL — ABNORMAL LOW (ref 4.22–5.81)
Retic Count, Absolute: 114.8 10*3/uL (ref 19.0–186.0)
Retic Ct Pct: 3.5 % — ABNORMAL HIGH (ref 0.4–3.1)

## 2015-04-15 LAB — T4, FREE: FREE T4: 1.14 ng/dL — AB (ref 0.61–1.12)

## 2015-04-15 LAB — GLUCOSE, CAPILLARY
GLUCOSE-CAPILLARY: 27 mg/dL — AB (ref 65–99)
Glucose-Capillary: 128 mg/dL — ABNORMAL HIGH (ref 65–99)

## 2015-04-15 LAB — TROPONIN I: Troponin I: 0.11 ng/mL — ABNORMAL HIGH (ref <0.031)

## 2015-04-15 SURGERY — TEMPORARY PACEMAKER
Anesthesia: LOCAL

## 2015-04-15 MED ORDER — LIDOCAINE HCL (PF) 1 % IJ SOLN
INTRAMUSCULAR | Status: AC
  Filled 2015-04-15: qty 30

## 2015-04-15 MED ORDER — ACETAMINOPHEN 325 MG PO TABS: 650.0000 mg | ORAL_TABLET | ORAL | Status: DC | PRN

## 2015-04-15 MED ORDER — SODIUM POLYSTYRENE SULFONATE 15 GM/60ML PO SUSP
30.0000 g | Freq: Once | ORAL | Status: AC
  Administered 2015-04-15: 30 g via RECTAL
  Filled 2015-04-15 (×2): qty 120

## 2015-04-15 MED ORDER — EPINEPHRINE HCL 0.1 MG/ML IJ SOSY
PREFILLED_SYRINGE | INTRAMUSCULAR | Status: DC | PRN
  Administered 2015-04-15 (×2): 1 mg via INTRAVENOUS

## 2015-04-15 MED ORDER — DOPAMINE-DEXTROSE 3.2-5 MG/ML-% IV SOLN
0.0000 ug/kg/min | INTRAVENOUS | Status: DC
Start: 2015-04-15 — End: 2015-04-15
  Administered 2015-04-15: 20 ug/kg/min via INTRAVENOUS

## 2015-04-15 MED ORDER — SODIUM CHLORIDE 0.9 % IV SOLN: 250.0000 mL | INTRAVENOUS | Status: DC | PRN

## 2015-04-15 MED ORDER — SODIUM CHLORIDE 0.9 % IV SOLN
1.0000 g | Freq: Once | INTRAVENOUS | Status: AC
  Administered 2015-04-15: 1 g via INTRAVENOUS
  Filled 2015-04-15: qty 10

## 2015-04-15 MED ORDER — ALBUMIN HUMAN 5 % IV SOLN
12.5000 g | Freq: Once | INTRAVENOUS | Status: AC
  Administered 2015-04-15: 12.5 g via INTRAVENOUS
  Filled 2015-04-15: qty 250

## 2015-04-15 MED ORDER — NOREPINEPHRINE BITARTRATE 1 MG/ML IV SOLN
0.0000 ug/min | INTRAVENOUS | Status: DC
  Administered 2015-04-15: 5 ug/min via INTRAVENOUS
  Filled 2015-04-15: qty 16

## 2015-04-15 MED ORDER — SODIUM BICARBONATE 8.4 % IV SOLN
50.0000 meq | Freq: Once | INTRAVENOUS | Status: AC
  Administered 2015-04-15: 50 meq via INTRAVENOUS
  Filled 2015-04-15: qty 50

## 2015-04-15 MED ORDER — DEXTROSE 50 % IV SOLN
INTRAVENOUS | Status: AC
  Administered 2015-04-15: 05:00:00
  Filled 2015-04-15: qty 50

## 2015-04-15 MED ORDER — SODIUM CHLORIDE 0.9 % IJ SOLN: 3.0000 mL | Freq: Two times a day (BID) | INTRAMUSCULAR | Status: DC

## 2015-04-15 MED ORDER — EPINEPHRINE HCL 0.1 MG/ML IJ SOSY
PREFILLED_SYRINGE | INTRAMUSCULAR | Status: AC
Start: 2015-04-15 — End: 2015-04-15
  Filled 2015-04-15: qty 20

## 2015-04-15 MED ORDER — ONDANSETRON HCL 4 MG/2ML IJ SOLN: 4.0000 mg | Freq: Four times a day (QID) | INTRAMUSCULAR | Status: DC | PRN

## 2015-04-15 MED ORDER — SODIUM CHLORIDE 0.9 % IV SOLN: INTRAVENOUS | Status: DC

## 2015-04-15 MED ORDER — SODIUM CHLORIDE 0.9 % IJ SOLN: 3.0000 mL | INTRAMUSCULAR | Status: DC | PRN

## 2015-04-15 MED ORDER — OXYCODONE HCL 5 MG PO TABS
5.0000 mg | ORAL_TABLET | Freq: Four times a day (QID) | ORAL | Status: DC | PRN
  Administered 2015-04-15: 5 mg via ORAL
  Filled 2015-04-15: qty 1

## 2015-04-15 MED FILL — Medication: Qty: 1 | Status: AC

## 2015-04-15 SURGICAL SUPPLY — 5 items
CATH S G BIP PACING (SET/KITS/TRAYS/PACK) ×1 IMPLANT
PACK CARDIAC CATHETERIZATION (CUSTOM PROCEDURE TRAY) ×1 IMPLANT
SHEATH PINNACLE 6F 10CM (SHEATH) ×1 IMPLANT
SLEEVE REPOSITIONING LENGTH 30 (MISCELLANEOUS) ×1 IMPLANT
TRANSDUCER W/STOPCOCK (MISCELLANEOUS) IMPLANT

## 2015-05-05 ENCOUNTER — Other Ambulatory Visit: Payer: Medicare Other

## 2015-05-06 NOTE — Progress Notes (Signed)
   05-04-15 0600  Clinical Encounter Type  Visited With Patient  Visit Type Initial;Death  Stress Factors  Family Stress Factors Loss   Chaplain was paged to patients room at 6:02 AM. Chaplain was notified that the patient had passed away and that family was present. Chaplain introduced himself to the patient's family. Patient's family grieved quietly at the bedside. Chaplain asked if he could provide anything. One family member said he prayed for the patient and that seemed to be enough for them at this time. Chaplain communicated family's funeral arrangements to patient's nurse. No further grief support needs at this time. Page Bonney Roussel if further support needed this morning.  Oaklan Persons, Claudius Sis, Chaplain  6:22 AM

## 2015-05-06 NOTE — CV Procedure (Signed)
[name] is a 81 y.o. male    [id number] LOCATION:  FACILITY: [facility]  PHYSICIAN: Quay Burow, M.D. [birth year]   DATE OF PROCEDURE:  04-21-2015  DATE OF DISCHARGE:    Temporary transvenous pacemaker insertion    History obtained from chart review. Mr. Kemppainen is a frail chronically ill appearing 81 year old white male with a history of CAD status post remote CABG who recently underwent endovascular abdominal aortic aneurysm repair and is multiple recent readmissions for abdominal pain nausea and vomiting. He has chronic renal insufficiency on hemodialysis. He has had demonstrated lactic acidosis and hyperkalemia with junctional bradycardia and hypotension/cardiogenic shock with altered mentation. He is a limited code with no chest compressions or intubation. I was asked to perform a temporary transvenous pacemaker insertion for heart rate. He is currently on intravenous pressors including dopamine and transcutaneous pacing and was minimally responsive. After long discussion with the cardiology fellow Dr. Susy Manor who spoke with the patient's family it was decided to proceed with emergency temporary transvenous pacemaker insertion   PROCEDURE DESCRIPTION:   The patient's right groin had already been instrumented as result of his recent EVAR, I accessed his left common femoral vein and placed a temporary transvenous pacemaker in his right ventricle were demonstrated capture at 10 MA. He did have cardiac motion on fluoroscopy. His oxygen sats were 85% on nonrebreather. He lost consciousness at the beginning of the case. Chest compressions were not performed because he was a "limited code". His blood pressure at the end of the case was 60/40 on IV levophed and dopamine.   HEMODYNAMICS:    AO SYSTOLIC/AO DIASTOLIC: 95/28       IMPRESSION:successful placement of emergency transvenous pacemaker in setting of junctional bradycardia and shock. At the end of the case patient stopped  capturing despite fluoroscopic documentation of adequate pacer position. After discussion with the cardiology fellow and assessment of the entire clinical situation I suspect the patient will not survive this event. His family is on the way in to discuss. The pacer was secured. The patient left the lab back to the intensive care unit in extremis on 2 vasopressor  drugs  Quay Burow. MD, [facility] 04/21/2015 5:32 AM

## 2015-05-06 NOTE — Progress Notes (Signed)
Provided positive pressure via ambu mack with 100% oxygen

## 2015-05-06 NOTE — Progress Notes (Addendum)
Shift event: Pt was admitted on this shift by Dr. Posey Pronto of [facility]. Pt has multiple co-morbidities including ESRD on HD (but non-compliant-skipping Weds), recent endovascular graft for AAA, CAD s/p CABP, Afib, etc.  See H&P.  First time, Gerald Stabs, RN paged this NP he informed this NP of continued brady in the 33s and BP in 90s. Pt still c/o abd pain. LA 7.9. HD was backed up and could be an hour or more before pt could be dyalized. Because of issues, this NP called Dr. Posey Pronto (who hadn't finished his H&P yet). Dr. Posey Pronto agreed to go and see pt.  Later, Gerald Stabs, RN, called this NP back to say pt needed another IV line but was a very hard stick. Foot stick order given. IV team to come and try. Can't use arm that HD fistula is in.  Shortly after this, this NP was paged to come to pt's room ASAP. This NP brought Dr. Posey Pronto to room. Pt brady'd down to the 20s and code blue was called. Pt never lost consciousness or required CPR. He was externally paced at 60 bpm. Multiple meds were given per order of Dr. Nelda Marseille of PCCM who was monitoring pt/code via camera.  This NP then called family. This NP spoke to pt's wife, Gay Filler. Explained situation at present and seriousness of pt's illness. Discussed code status with wife (pt was a FULL CODE on record). Wife states that when pt had endograft surgery about a month ago, they discussed life support and the pt stated he would not want to live on a machine. This NP explained CPR separately from intubation and wife stated he didn't want that either.  At that point, pt declared a DNR/DNI.  Then, based on PCCM's plans for pressors, line and TF to ICU, this NP called the wife again. Explained about meds to support heart to try and help HR and BP. Explained about "line". She wanted all this done and the line is drawn at CPR and intubation. Therefore, pt moved to ICU under service of PCCM. Cardiology on scene and plan on temporary pacer. Pt will have line placed and pressors started.  Electrolytes being addressed since HD is on hold at this time.  Clance Boll, NP [facility] Update: Pt was taken to cath lab for pacer insertion. While in the cath lab, the pt desatted on the NRB and became unresponsive. Was brought back to unit and pronounced dead shortly after arrival. Pronounced by 2 RNs and this NP. Asystole on monitor. No heart sounds. No pulse or respirations. TOD [id number].  Family arrived only minutes after pt expired. Wife and son present. Support offered. Wife stated pt had been so sick for about 2 years and she knew he was "better off now" and would no longer have pain. Chaplain came. Cardiologist came and spoke to family.  PCCM aware of pt's passing. Death certificate completed and given to RN.  Clance Boll, NP [facility]

## 2015-05-06 NOTE — Progress Notes (Signed)
Patient brought back from cath lab gray with no spontaneous respirations. Forrest Moron, NP was at the bedside and aware of patient condition. Pronounced dead at 0556 by two RN's [place] Audriana Aldama, RN and Melene Plan, RN. Family arrived at bedside and was informed of patient's death by physician. Dr. Gwenlyn Found also spoke to family following death of patient. Chaplain was brought in to offer spiritual and emotional support for the family.

## 2015-05-06 NOTE — Plan of Care (Signed)
On Call:   Responded to nursing staff's call. Patient bradycardiac with sinus pauses and HR in early 30's with wide complex escape rhtyhm. Has baseline LBBB, ESRD, hemodialysis, CAD, AAA recent EVAR on 04/06/15. Has had abd pain and nausea since requirnig repeated hospital visit. Patient confused off an on. BP in 66'K systolic. Code was called. Epi x1, CaClx1, CaGlucx1, Bicarbx2. He is being paced transcutaneously. His K has been 5.5 to 6 and Bicarb below 15. He is DNR. Recent CTA abd/pelvis did not reveal any gross post-EVAR complication. Has small hematoma R groin access site. He has been started on Dopamine now at 20 mcg. Tele revealed Junctional rhythm in 40's but then sinus pauses and junctional and V escape rhythm with long QT. No VT or VF or PEA. He was seen by cards consult upon admission yesterday when Amio and Dilt were stopped given junctional rhythm.   His bradycardia may be due to multiple factors including electrolyte and acid-base abnormality, medications he was on, vasovagal due to abdominal issues, and primary sinus node dysfunction.   Discussed with on cal interventional cardiology. Will proceed with temporary pacemaker placement for now. Primary team has discussed with family who are in agreement.   Wandra Mannan, MD Cardiology

## 2015-05-06 NOTE — Progress Notes (Signed)
Present for Code Blue along with IM upper level resident. Dr. Nelda Marseille, PCCM, running code on arrival. Primary team ([facility]) at the bedside as well. Per report, pt recently admitted with ESRD who missed dialysis on Wednesday awaiting HD here.  Patient noted to gradually brady down to the 20s-30s when code was being called. Currently being paced. Pt had received bicarb and calcium chloride and was in the process of getting dopamine per CCM.   Archie Patten, MD [facility] Family Medicine Resident  05-May-2015, 3:52 AM

## 2015-05-06 NOTE — Consult Note (Cosign Needed)
PULMONARY / CRITICAL CARE MEDICINE   Name: [name] MRN: [id number] DOB: 1934/06/22    ADMISSION DATE:  04/13/2015 CONSULTATION DATE:  04-18-2015  REFERRING MD :  Dr. Posey Pronto [facility]   INITIAL PRESENTATION: 81 year old male with ESRD and CHF presented 6/9 with abdominal pain. Very recent AAA repair. CT scan unremarkable, but he has missed dialysis. K elevated and bradycardic. This was temporized and plan was for HD. 6/10 acute decompensation with near cardiac bradycardic arrest requiring transcutaneous pacing PCCM consulted.   A/P Patient went to cath lab for transvenous pacer placement prior to full PCCM evaluation. Unfortunately immediately after case the patient became unresponsive and acutely decompensated despite pacemaker and multiple pressors. He was unable to survive.

## 2015-05-06 NOTE — Progress Notes (Signed)
CRITICAL VALUE ALERT  Critical value received: lactic acid 7+  Date of notification:  6/10  Time of notification:  0050  Critical value read back:yes  Nurse who received alert:  Deloris Ping   MD notified (1st page):  K.Kirby  Time of first page: 0110  MD notified (2nd page):  Time of second page:  Responding MD:   Time MD responded:

## 2015-05-06 DEATH — deceased

## 2015-05-12 ENCOUNTER — Encounter: Payer: Medicare Other | Admitting: Vascular Surgery

## 2015-05-19 ENCOUNTER — Encounter: Payer: Medicare Other | Admitting: Vascular Surgery

## 2015-06-06 NOTE — Discharge Summary (Signed)
Physician Discharge Summary  Patient: [name]  BMW:[id number]  DOB: 01/23/1934  PCP: Glenda Chroman., MD Date of admission: 04-16-15 Date of death: 04-17-2015 5:56 AM  Discharge Diagnoses:  Principal Problem:   ESRD (end stage renal disease) Active Problems:   S/P CABG x 4   Anemia   Atrial flutter   History of repair of aneurysm of abdominal aorta using endovascular stent graft   Abdominal pain   Hyperkalemia   Junctional bradycardia   CHF exacerbation  History of present illness:  81 year old male with past medical history of ESRD, coronary artery disease status post CABG, chronic anemia, atrial flutter, AAA repair with endovascular stent recent presented with complaints of nausea and recurrent abdominal pain.  Patient was recently hospitalized for endovascular repair of AAA, this was followed by another admission secondary to abdominal pain at which time extensive workup was unremarkable for abdominal pain and it was thought to be secondary to constipation as well as postop discomfort from stent. Repeat CT scan of the abdomen on admission this time did not show any new abnormality, and after consultation with cardiology as well as nephrology, the patient was admitted in stepdown unit under hospitalist service.  Hospital Course:  During the course of hospitalization patient was found to be volume overloaded and mildly hyperkalemic, as per nephrology patient has missed his Wednesday treatment for dialysis and they were preparing for the acute hemodialysis. Due to junctional bradycardia, his QT prolonging medications were discontinued by cardiology. patient continued to complain of abdominal pain, but the workup including a CT scan of the abdomen with contrast, was unremarkable other than elevated lactic acid. Initially due to elevated lactic acid, case was discussed with PCCM  Dr. Nelda Marseille and as per the discussion, plan was to continue provide supportive management awaiting  hemodialysis.  Patient was given calcium gluconate and bicarbonate and albumin. Later on the patient acutely decompensated with heart rate in 20s, CODE BLUE was called, no CPR required and patient was treated with ACLS medications and transcutaneous pacing. The patient's care was transferred to [facility] for further management.  CODE STATUS was changed from full code to DNR/DNI as per discussion by NP with patient's wife over phone.  Cardiology evaluated the patient at the time of [place] and urgently placed a temporary pacemaker wire after initial stabilization. As per available documentation, after the placement of the temporary pacemaker the patient desaturated, requiring nonrebreather and later became unresponsive and was pronounced dead at 5:56 AM. Support was provided to family at bedside.  Procedures:   temporary transvenous pacemaker insertion  Consultations:  Pulmonary critical care Dr. Nelda Marseille   Cardiology   Nephrology   Allergies  Allergen Reactions  . 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 Weakness. Short of breath. Initial encounter.  EXAM: PORTABLE CHEST - 1 VIEW  COMPARISON:  04/09/2015.  FINDINGS: Cardiomegaly. Defibrillator pads overlie the chest. Monitoring leads project over the chest. Chronic pulmonary parenchymal scarring and opacity at the lung bases is similar to 04/09/2015 chest radiograph. No new airspace consolidation is identified allowing for overlying support apparatus. Median sternotomy/ CABG.  IMPRESSION: Cardiomegaly and chronic changes of the chest. Chronic pulmonary vascular congestion. No interval change or acute abnormality.   Electronically Signed   By: Dereck Ligas M.D.   On: 2015-04-16 20:13   Dg Abd Portable 1v  Apr 16, 2015  CLINICAL DATA:  Weakness. Abdominal pain. Symptoms for 4 days. Initial encounter.  EXAM: PORTABLE ABDOMEN -  1 VIEW  COMPARISON:  CT 04/09/2015.  FINDINGS: Aortoiliac stent graft. Prostate brachytherapy seeds are present. The bowel gas pattern is normal. No dilated loops of large or small bowel. Gas is present within the rectum. Atherosclerosis.  IMPRESSION: No acute abnormality.  Atherosclerosis and aortoiliac stent graft.   Electronically Signed   By: Dereck Ligas M.D.   On: 04/13/2015 20:14   Ct Angio Abd/pel W/ And/or W/o  05/02/2015   CLINICAL DATA:  81 year old male with a history of prior abdominal aortic aneurysm repair with endograft. Recent discharge.  EXAM: CTA ABDOMEN AND PELVIS wITHOUT AND WITH CONTRAST  TECHNIQUE: Multidetector CT imaging of the abdomen and pelvis was performed using the standard protocol during bolus administration of intravenous contrast. Multiplanar reconstructed images and MIPs were obtained and reviewed to evaluate the vascular anatomy.  CONTRAST:  117mL OMNIPAQUE IOHEXOL 350 MG/ML SOLN  COMPARISON:  CT 04/09/2015, 03/29/2015, 02/17/2015  FINDINGS: Lower chest:  Superficial soft tissues the lower chest unremarkable. Surgical changes of median sternotomy.  Heart size enlarged, similar to prior. Calcifications of the left main, left anterior descending, circumflex, right coronary arteries.  No pericardial fluid/ thickening.  Respiratory motion limits evaluation. Atelectasis at the bilateral lung bases. Ground-glass opacity in the right middle lobe, improved from the prior. Trace bilateral pleural effusions.  Abdomen/pelvis:  Liver with irregular/ nodular contour, similar to the comparison.  Unremarkable appearance of spleen.  Unremarkable appearance of the pancreas.  Distended gallbladder with high density material, similar to the comparison. This compatible with vicarious excretion of contrast.  Unremarkable bilateral adrenal glands.  Bilateral kidneys are atrophic, similar to the comparison. No hydronephrosis. Vascular calcifications versus nonobstructive stones in the hilum of the  bilateral kidneys.  Enteric contrast within the colon, without transition point. No abnormally distended small bowel or colon to indicate obstruction. Multiple colonic diverticula, without associated inflammatory changes. Normal appendix.  Small amount of free fluid within the dependent pelvis, with low Hounsfield units.  Small amount of free fluid overlying the liver subjacent to the diaphragm, similar to the comparison.  Circumferential thickening of the urinary bladder wall.  Vascular:  Again demonstrated are postsurgical changes of endovascular repair with Gore endograft, with right femoral main body delivery.  The proximal aspect of the endograft is just inferior to the bilateral renal arteries. The bilateral iliac limbs terminate within the common iliac arteries.  Greatest diameter of the excluded aneurysm sac measures 5.6 cm is unchanged from the comparison CT. The greatest diameter measures 6.6 cm on image 87, which is slightly less than the comparison CT.  On this single phase CT, there is no evidence of differential attenuation within the aneurysm sac to suggest ongoing endoleak. The previous gas has resolved.  Celiac artery and superior mesenteric artery remain patent. Bilateral iliac arteries remain patent with associated atherosclerotic changes.  Re- demonstration of hematoma/seroma at the right common femoral artery, which is similar in size to the comparison.  No displaced fracture. Multilevel degenerative changes of the spine.  Review of the MIP images confirms the above findings.  IMPRESSION: Status post endovascular repair of infrarenal abdominal aortic aneurysm, with unchanged size of the excluded aneurysm sac on the current CT compared to the prior.  The differential attenuation of the excluded aneurysm sac is no longer present, with no evidence on the current single phase study of endoleak. Continued surveillance is recommended.  Small hematoma/ seroma  at the right common femoral artery main  body delivery access site, unchanged from the comparison.  Small amount of low-density free fluid within the anatomic pelvis, of uncertain significance, potentially reactive, or otherwise related 2 positive fluid status. There is similar appearance of free fluid under the right hemidiaphragm as compared to the prior CT.  Signed,  Dulcy Fanny. Earleen Newport, DO  [facility]  [facility]   Electronically Signed   By: Corrie Mckusick D.O.   On: 04/14/2015 22:14   US Abdomen Limited Ruq  04/11/2015   CLINICAL DATA:  Right upper quadrant pain for 1 month  EXAM: US ABDOMEN LIMITED - RIGHT UPPER QUADRANT  COMPARISON:  04/09/2015  FINDINGS: Gallbladder:  No gallstones are identified. Considerable sludge is noted within the gallbladder. No increased wall thickness is noted.  Common bile duct:  Diameter: 4.5 mm  Liver:  No focal lesion identified. Within normal limits in parenchymal echogenicity.  Incidental note is made of minimal ascites similar to that seen on recent CT examination.  IMPRESSION: Minimal ascites.  Gallbladder sludge.   Electronically Signed   By: Inez Catalina M.D.   On: 04/11/2015 15:30   Signed:  Berle Mull  [facility] 05/09/2015, 7:01 PM
